# Patient Record
Sex: Female | Born: 1965
Health system: Southern US, Community
[De-identification: ages and names within clinical notes are randomized; demographics above are authoritative.]

## PROBLEM LIST (undated history)

## (undated) ENCOUNTER — Emergency Department (HOSPITAL_COMMUNITY)

## (undated) DIAGNOSIS — R079 Chest pain, unspecified: Secondary | ICD-10-CM

## (undated) DIAGNOSIS — I82609 Acute embolism and thrombosis of unspecified veins of unspecified upper extremity: Secondary | ICD-10-CM

## (undated) DIAGNOSIS — M179 Osteoarthritis of knee, unspecified: Secondary | ICD-10-CM

## (undated) DIAGNOSIS — E785 Hyperlipidemia, unspecified: Secondary | ICD-10-CM

## (undated) DIAGNOSIS — U071 COVID-19: Secondary | ICD-10-CM

## (undated) DIAGNOSIS — J209 Acute bronchitis, unspecified: Secondary | ICD-10-CM

## (undated) DIAGNOSIS — R519 Headache, unspecified: Secondary | ICD-10-CM

## (undated) DIAGNOSIS — E119 Type 2 diabetes mellitus without complications: Secondary | ICD-10-CM

## (undated) DIAGNOSIS — I38 Endocarditis, valve unspecified: Secondary | ICD-10-CM

## (undated) DIAGNOSIS — I471 Supraventricular tachycardia, unspecified: Secondary | ICD-10-CM

## (undated) DIAGNOSIS — G709 Myoneural disorder, unspecified: Secondary | ICD-10-CM

## (undated) DIAGNOSIS — Z9889 Other specified postprocedural states: Secondary | ICD-10-CM

## (undated) DIAGNOSIS — I5032 Chronic diastolic (congestive) heart failure: Secondary | ICD-10-CM

## (undated) DIAGNOSIS — J45909 Unspecified asthma, uncomplicated: Secondary | ICD-10-CM

## (undated) DIAGNOSIS — E7800 Pure hypercholesterolemia, unspecified: Secondary | ICD-10-CM

## (undated) DIAGNOSIS — R000 Tachycardia, unspecified: Secondary | ICD-10-CM

## (undated) DIAGNOSIS — I7300 Raynaud's syndrome without gangrene: Secondary | ICD-10-CM

## (undated) DIAGNOSIS — R400 Somnolence: Secondary | ICD-10-CM

## (undated) DIAGNOSIS — I499 Cardiac arrhythmia, unspecified: Secondary | ICD-10-CM

## (undated) DIAGNOSIS — R002 Palpitations: Secondary | ICD-10-CM

## (undated) DIAGNOSIS — Z973 Presence of spectacles and contact lenses: Secondary | ICD-10-CM

## (undated) DIAGNOSIS — G4730 Sleep apnea, unspecified: Secondary | ICD-10-CM

## (undated) DIAGNOSIS — I517 Cardiomegaly: Secondary | ICD-10-CM

## (undated) DIAGNOSIS — M1990 Unspecified osteoarthritis, unspecified site: Secondary | ICD-10-CM

## (undated) DIAGNOSIS — T8859XA Other complications of anesthesia, initial encounter: Secondary | ICD-10-CM

## (undated) DIAGNOSIS — E1165 Type 2 diabetes mellitus with hyperglycemia: Secondary | ICD-10-CM

## (undated) DIAGNOSIS — R112 Nausea with vomiting, unspecified: Secondary | ICD-10-CM

## (undated) DIAGNOSIS — C801 Malignant (primary) neoplasm, unspecified: Secondary | ICD-10-CM

## (undated) DIAGNOSIS — C649 Malignant neoplasm of unspecified kidney, except renal pelvis: Secondary | ICD-10-CM

## (undated) DIAGNOSIS — F32A Depression, unspecified: Secondary | ICD-10-CM

## (undated) DIAGNOSIS — F4310 Post-traumatic stress disorder, unspecified: Secondary | ICD-10-CM

## (undated) DIAGNOSIS — I10 Essential (primary) hypertension: Secondary | ICD-10-CM

## (undated) DIAGNOSIS — K219 Gastro-esophageal reflux disease without esophagitis: Secondary | ICD-10-CM

## (undated) HISTORY — PX: ENDOMETRIAL ABLATION: SHX621

## (undated) HISTORY — DX: Osteoarthritis of knee, unspecified: M17.9

## (undated) HISTORY — DX: Palpitations: R00.2

## (undated) HISTORY — DX: Type 2 diabetes mellitus with hyperglycemia: E11.65

## (undated) HISTORY — DX: Unspecified asthma, uncomplicated: J45.909

## (undated) HISTORY — DX: Morbid (severe) obesity due to excess calories: E66.01

## (undated) HISTORY — DX: Somnolence: R40.0

## (undated) HISTORY — DX: Raynaud's syndrome without gangrene: I73.00

## (undated) HISTORY — PX: CARDIAC ELECTROPHYSIOLOGY STUDY AND ABLATION: SHX1294

## (undated) HISTORY — DX: Supraventricular tachycardia, unspecified: I47.10

## (undated) HISTORY — DX: Supraventricular tachycardia: I47.1

## (undated) HISTORY — DX: Type 2 diabetes mellitus without complications: E11.9

## (undated) HISTORY — DX: Hyperlipidemia, unspecified: E78.5

## (undated) HISTORY — DX: Chest pain, unspecified: R07.9

## (undated) HISTORY — DX: Chronic diastolic (congestive) heart failure: I50.32

## (undated) HISTORY — DX: Acute bronchitis, unspecified: J20.9

## (undated) HISTORY — DX: COVID-19: U07.1

## (undated) HISTORY — PX: CARDIAC CATHETERIZATION: SHX172

## (undated) SURGERY — Surgical Case
Anesthesia: *Unknown

---

## 1998-10-05 ENCOUNTER — Other Ambulatory Visit: Admission: RE | Admit: 1998-10-05 | Discharge: 1998-10-05 | Payer: Self-pay | Admitting: Obstetrics and Gynecology

## 2000-02-10 ENCOUNTER — Ambulatory Visit (HOSPITAL_COMMUNITY): Admission: RE | Admit: 2000-02-10 | Discharge: 2000-02-10 | Payer: Self-pay | Admitting: Pulmonary Disease

## 2000-03-13 ENCOUNTER — Other Ambulatory Visit: Admission: RE | Admit: 2000-03-13 | Discharge: 2000-03-13 | Payer: Self-pay | Admitting: Obstetrics and Gynecology

## 2000-09-18 ENCOUNTER — Ambulatory Visit (HOSPITAL_COMMUNITY): Admission: RE | Admit: 2000-09-18 | Discharge: 2000-09-18 | Payer: Self-pay | Admitting: Cardiology

## 2001-07-11 ENCOUNTER — Other Ambulatory Visit: Admission: RE | Admit: 2001-07-11 | Discharge: 2001-07-11 | Payer: Self-pay | Admitting: Obstetrics and Gynecology

## 2002-08-02 ENCOUNTER — Emergency Department (HOSPITAL_COMMUNITY): Admission: EM | Admit: 2002-08-02 | Discharge: 2002-08-02 | Payer: Self-pay | Admitting: Emergency Medicine

## 2002-11-19 ENCOUNTER — Other Ambulatory Visit: Admission: RE | Admit: 2002-11-19 | Discharge: 2002-11-19 | Payer: Self-pay | Admitting: Obstetrics and Gynecology

## 2003-02-04 ENCOUNTER — Encounter: Payer: Self-pay | Admitting: Obstetrics and Gynecology

## 2003-02-04 ENCOUNTER — Ambulatory Visit (HOSPITAL_COMMUNITY): Admission: RE | Admit: 2003-02-04 | Discharge: 2003-02-04 | Payer: Self-pay | Admitting: Obstetrics and Gynecology

## 2003-03-18 ENCOUNTER — Encounter: Payer: Self-pay | Admitting: Obstetrics and Gynecology

## 2003-03-18 ENCOUNTER — Ambulatory Visit (HOSPITAL_COMMUNITY): Admission: RE | Admit: 2003-03-18 | Discharge: 2003-03-18 | Payer: Self-pay | Admitting: Obstetrics and Gynecology

## 2003-03-27 ENCOUNTER — Emergency Department (HOSPITAL_COMMUNITY): Admission: EM | Admit: 2003-03-27 | Discharge: 2003-03-27 | Payer: Self-pay | Admitting: Emergency Medicine

## 2003-04-21 ENCOUNTER — Inpatient Hospital Stay (HOSPITAL_COMMUNITY): Admission: AD | Admit: 2003-04-21 | Discharge: 2003-04-21 | Payer: Self-pay | Admitting: Obstetrics and Gynecology

## 2003-06-02 ENCOUNTER — Ambulatory Visit (HOSPITAL_COMMUNITY): Admission: RE | Admit: 2003-06-02 | Discharge: 2003-06-02 | Payer: Self-pay | Admitting: Obstetrics and Gynecology

## 2003-06-19 ENCOUNTER — Inpatient Hospital Stay (HOSPITAL_COMMUNITY): Admission: RE | Admit: 2003-06-19 | Discharge: 2003-06-23 | Payer: Self-pay | Admitting: Obstetrics and Gynecology

## 2003-06-19 ENCOUNTER — Encounter (INDEPENDENT_AMBULATORY_CARE_PROVIDER_SITE_OTHER): Payer: Self-pay | Admitting: Specialist

## 2004-01-28 ENCOUNTER — Other Ambulatory Visit: Admission: RE | Admit: 2004-01-28 | Discharge: 2004-01-28 | Payer: Self-pay | Admitting: Obstetrics and Gynecology

## 2004-07-05 ENCOUNTER — Ambulatory Visit: Payer: Self-pay | Admitting: Internal Medicine

## 2004-10-14 ENCOUNTER — Ambulatory Visit: Payer: Self-pay | Admitting: Internal Medicine

## 2004-10-19 ENCOUNTER — Ambulatory Visit (HOSPITAL_COMMUNITY): Admission: RE | Admit: 2004-10-19 | Discharge: 2004-10-20 | Payer: Self-pay | Admitting: Internal Medicine

## 2004-10-24 ENCOUNTER — Ambulatory Visit: Payer: Self-pay | Admitting: Cardiology

## 2007-06-17 ENCOUNTER — Emergency Department (HOSPITAL_COMMUNITY): Admission: EM | Admit: 2007-06-17 | Discharge: 2007-06-17 | Payer: Self-pay | Admitting: Emergency Medicine

## 2008-12-04 ENCOUNTER — Encounter (INDEPENDENT_AMBULATORY_CARE_PROVIDER_SITE_OTHER): Payer: Self-pay | Admitting: *Deleted

## 2009-05-31 ENCOUNTER — Emergency Department (HOSPITAL_COMMUNITY): Admission: EM | Admit: 2009-05-31 | Discharge: 2009-05-31 | Payer: Self-pay | Admitting: Emergency Medicine

## 2009-07-01 ENCOUNTER — Ambulatory Visit (HOSPITAL_COMMUNITY): Admission: RE | Admit: 2009-07-01 | Discharge: 2009-07-01 | Payer: Self-pay | Admitting: Cardiology

## 2010-01-13 ENCOUNTER — Emergency Department (HOSPITAL_COMMUNITY): Admission: EM | Admit: 2010-01-13 | Discharge: 2010-01-13 | Payer: Self-pay | Admitting: Emergency Medicine

## 2010-04-27 ENCOUNTER — Ambulatory Visit (HOSPITAL_COMMUNITY): Admission: RE | Admit: 2010-04-27 | Payer: Self-pay | Admitting: Pulmonary Disease

## 2010-06-18 ENCOUNTER — Encounter: Payer: Self-pay | Admitting: Pulmonary Disease

## 2010-08-13 ENCOUNTER — Emergency Department (HOSPITAL_COMMUNITY)
Admission: EM | Admit: 2010-08-13 | Discharge: 2010-08-14 | Disposition: A | Discharge status: Home / Self Care | Payer: Self-pay | Attending: Emergency Medicine | Admitting: Emergency Medicine

## 2010-08-13 DIAGNOSIS — K089 Disorder of teeth and supporting structures, unspecified: Secondary | ICD-10-CM | POA: Insufficient documentation

## 2010-08-13 DIAGNOSIS — K029 Dental caries, unspecified: Secondary | ICD-10-CM | POA: Insufficient documentation

## 2010-08-13 DIAGNOSIS — Z79899 Other long term (current) drug therapy: Secondary | ICD-10-CM | POA: Insufficient documentation

## 2010-08-13 DIAGNOSIS — K047 Periapical abscess without sinus: Secondary | ICD-10-CM | POA: Insufficient documentation

## 2010-08-14 LAB — DIFFERENTIAL
Basophils Absolute: 0 10*3/uL (ref 0.0–0.1)
Basophils Relative: 0 % (ref 0–1)
Eosinophils Absolute: 0.2 10*3/uL (ref 0.0–0.7)
Monocytes Absolute: 0.8 10*3/uL (ref 0.1–1.0)
Monocytes Relative: 5 % (ref 3–12)
Neutrophils Relative %: 83 % — ABNORMAL HIGH (ref 43–77)

## 2010-08-14 LAB — BASIC METABOLIC PANEL
CO2: 27 mEq/L (ref 19–32)
Chloride: 102 mEq/L (ref 96–112)
Creatinine, Ser: 0.67 mg/dL (ref 0.4–1.2)
GFR calc Af Amer: >60 mL/min (ref >60)
Glucose, Bld: 151 mg/dL — ABNORMAL HIGH (ref 70–99)

## 2010-08-14 LAB — CBC
MCHC: 34.4 g/dL (ref 30.0–36.0)
MCV: 88.4 fL (ref 78.0–100.0)
RBC: 4.59 MIL/uL (ref 3.87–5.11)
RDW: 13 % (ref 11.5–15.5)

## 2010-08-14 LAB — POCT CARDIAC MARKERS: CKMB, poc: 2.1 ng/mL (ref 1.0–8.0)

## 2010-08-18 LAB — URINALYSIS, ROUTINE W REFLEX MICROSCOPIC
Glucose, UA: NEGATIVE mg/dL
Protein, ur: NEGATIVE mg/dL
Specific Gravity, Urine: 1.027 (ref 1.005–1.030)
pH: 5.5 (ref 5.0–8.0)

## 2010-08-18 LAB — URINE MICROSCOPIC-ADD ON

## 2010-10-14 NOTE — Op Note (Signed)
NAMESHAIANN, MCMANAMON                  ACCOUNT NO.:  [id number]   MEDICAL RECORD NO.:  [id number]          PATIENT TYPE:  OIB   LOCATION:  2899                         FACILITY:  [facility]   PHYSICIAN:  Doylene Canning. Ladona Ridgel, M.D.  DATE OF BIRTH:  11-21-1965   DATE OF PROCEDURE:  10/19/2004  DATE OF DISCHARGE:                                 OPERATIVE REPORT   PROCEDURE PERFORMED:  Electrophysiologic study and RF catheter ablation of  unusual AV node reentry tachycardia.   INTRODUCTION:  Patient is a 45 year old woman with a history of recurrent  tachy palpitations and multiple visits to the emergency room with  termination with adenosine of her arrhythmias. The patient despite medical  therapy has had continued symptoms and is now referred for  electrophysiologic study and catheter ablation.   PROCEDURE:  After informed consent was obtained, the patient is taken to the  diagnostic EP lab in fasting state.  After the usual preparation and  draping, intravenous fentanyl and Midazolam was given for sedation. The 6-  [place] hexapolar catheter was inserted percutaneously into the right jugular  vein and advanced to the coronary sinus. A 5-French quadripolar catheter was  inserted percutaneously into the right femoral vein and advanced to the  right ventricle. 5-French quadripolar catheter was inserted percutaneously  in the right femoral vein advanced to the His bundle region. After  measurement basic intervals, rapid ventricular pacing was carried out the RV  apex at paced cycle length of 600 milliseconds and stepwise decreased down  to 500 milliseconds where VA Wenckebach was observed. During rapid  ventricular pacing, the atrial activation sequence was midline and  decremental. Next programmed ventricular stimulation was carried out the RV  apex at basic drive cycle length of 045 milliseconds.  The S1-S2 interval  was stepwise decreased down to 420 milliseconds where the retrograde AV node  ERP  was observed. During programmed ventricular stimulation the atrial  activation was again midline and decremental. Next, programmed atrial  stimulation was carried out the coronary sinus at basic drive cycle length  of 409 milliseconds.  The S1-S2 interval was stepwise decreased down to 250  milliseconds where the AV node ERP was observed. During programmed  atrial  stimulation there were no clear AH jumps but there were echo beats. The PR  interval was greater than the RR interval. There was no inducible SVT with  programmed atrial stimulation at base drive cycle lengths of 811, 500, and  400 milliseconds both from the coronary sinus as well as the heart atrium.  Next rapid atrial pacing was carried out from the coronary sinus as well as  the high right atrium at pacing cycle length of 600 milliseconds and  stepwise decreased down to 3 milliseconds where AV Wenckebach was observed.  During rapid atrial pacing, there was inducible although it was somewhat  nonsustained SVT. This would started with PR prolongation. The SVT was a  short RP tachycardia but it was typically longer than would normally be  expected with AV node reentrant tachycardia. Despite this, PVCs placed at  the time of  His bundle refractoriness did not pre-excite the atrium. In  addition, V pacing during tachycardia demonstrated clear-cut VAV conduction  sequence. All the above were demonstrative for AV node reentry tachycardia  of the unusual type. The ablation catheter was then moved to the region of  Koch's triangle and mapping in Koch's triangle was carried out. Despite the  patient's somewhat large size, Koch's triangle was extraordinarily small.  The coronary sinus also was displaced anteriorly. All the above made mapping  quite difficult. Nine RF energy applications were delivered at progressive  sites in Koch's triangle starting at site eight and progressing up to site 3  to 4. RF energy application was delivered  throughout the sites resulting in  accelerated junctional rhythm and occasionally at sites 5 through 3 in  Koch's triangle intermittent VA block during junctional rhythm. The above  were suggestive that the catheter was very close to the AV node and that  heart block was very possible. Despite this, complete heart block did not  occur. Finally on the ninth RF energy application, rapid atrial pacing  resulted in no inducible tachycardia. It should be noted that during the  first several RF energy applications tachycardia could easily be reinduced  despite prolonged accelerated junctional rhythm. Finally during the during  the final RF energy application, which was very close within one catheter  width from the AV node there was no were no additional inducible SVT. At  this point the catheters were removed. Hemostasis was assured and the  patient returned to her room in satisfactory condition.   COMPLICATIONS:  There were no immediate procedure complications.   RESULTS:  A.  Baseline ECG. The baseline ECG demonstrates normal sinus  rhythm with normal axis and intervals. There is no pre-excitation noted.  B.  Baseline intervals.  Sinus node cycle length was 828 milliseconds, the  PR interval 152 milliseconds, QRS duration 94 milliseconds, the HV interval  43 milliseconds.  C.  Rapid ventricular pacing. Rapid ventricular pacing was carried out the  RV apex demonstrated VA Wenckebach cycle length of 500 milliseconds. During  rapid ventricular pacemaker activation was midline decremental. During rapid  ventricular pacing there was no inducible SVT.  D.  Programmed ventricular stimulation. Programmed ventricular stimulation  was carried out the RV apex at basic drive cycle length of 191 milliseconds.  The S1-S2 interval was stepwise decreased from 540 milliseconds down to 420  milliseconds with a retrograde AV node ERP was observed. During probing stimulation there was no inducible SVT and the  atrial activation was midline  decremental.  E.  Programmed atrial stimulation.  Programmed atrial stimulation was  carried out the coronary sinus as well as from the high right atrium at  basic drive cycle lengths of 478, 500, and 400 milliseconds.  The S1-S2  interval was stepwise decreased down to the AV node ERP and S1-S2 coupling  interval 500/250. During programmed atrial stimulation there were echo beats  but no clear-cut AH jumps noted. The PR interval was greater than the RR  interval during programmed atrial stimulation.  F.  Rapid atrial pacing. Rapid atrial pacing was carried out from the  coronary sinus as well as the high right atrium at base drive cycle length  of 295 milliseconds and stepwise decreased down to 300 milliseconds where AV  Wenckebach was observed. During rapid atrial pacing, there was inducible SVT  particularly during isoproterenol infusion.  G.  Arrhythmias observed.  1.  AV node reentry tachycardia (unusual).  Initiation  was with rapid atrial pacing during isoproterenol infusion,  duration was sustained, cycle length during tachycardia was approximately  400 milliseconds. The tachycardia would terminate spontaneously and with  rapid ventricular pacing.  H.  Mapping. Mapping was the patient's SVT demonstrated the earliest atrial  activation in Koch's triangle very close to the region of the AV node. It  should be noted that Koch's triangle was extraordinarily small and the  coronary sinus was quite anteriorly displaced. I.  RF energy applications.  A total of nine RF energy applications were very carefully delivered in the  region around the AV node in Koch's triangle. These were initially delivered  between site seven and site eight in Koch's triangle. However, because of  despite the presence of accelerated junctional rhythm. The patient has  persistence of inducible SVT. Additional RF energy applications were  delivered in regions progressively, closer  and closer to the AV node.  Finally at site 3 to 4 in Koch's triangle during the ninth RF energy  application there was accelerated junctional rhythm followed by V without an  A and RF energy was discontinued. There was no complete heart block  delivered, however. Following this there was no inducible SVT.   CONCLUSION:  This study demonstrates successful electrophysiologic study and  RF catheter ablation of unusual AV node reentry tachycardia, with total of  nine RF energy applications. It should be noted that the patient's pathway  was very, very close to the AV node. This resulted in limitation of RF  energy application in this particular patient. There no immediate procedure  complications.      GWT/MEDQ  D:  10/19/2004  T:  10/19/2004  Job:  [id number]   cc:   Ramon Dredge L. Juanetta Gosling, M.D.  [address]  [place]  [place] [address]  Fax: [phone]   Miguel Aschoff, M.D.  [address], [address]  [place]  [place] [address]  Fax: [phone]

## 2010-10-14 NOTE — Op Note (Signed)
Christina Cameron, Christina Cameron                            ACCOUNT NO.:  [id number]   MEDICAL RECORD NO.:  [id number]                   PATIENT TYPE:  INP   LOCATION:  9121                                 [facility]   PHYSICIAN:  Miguel Aschoff, M.D.                    DATE OF BIRTH:  Jun 11, 1965   DATE OF PROCEDURE:  06/19/2003  DATE OF DISCHARGE:                                 OPERATIVE REPORT   PREOPERATIVE DIAGNOSES:  1. Intrauterine pregnancy at 37-1/2 weeks.  2. Pregnancy-induced hypertension.  3. Morbid obesity.  4. Previous cesarean section.  5. Desired sterilization.   POSTOPERATIVE DIAGNOSES:  1. Intrauterine pregnancy at 37-1/2 weeks.  2. Pregnancy-induced hypertension.  3. Morbid obesity.  4. Previous cesarean section.  5. Desired sterilization.  6. Viable female infant, Apgars 9 and 9.   PROCEDURES:  1. Repeat low flap transverse cesarean section.  2. Bilateral Pomeroy tubal sterilization.   SURGEON:  Miguel Aschoff, M.D.   ASSISTANT:  Carrington Clamp, M.D.   ANESTHESIA:  Spinal.   COMPLICATIONS:  None.   JUSTIFICATION:  The patient is a 45 year old white female, gravida 4, para 1-  0-2-1, at 37-1/2 weeks' gestation.  The patient is noted to be hypertensive.  In addition, the patient has had a previous cesarean section and requests  sterilization in view of the hypertension.  At this gestational age she is  being taken now to undergo repeat cesarean section.  The risks and benefits  were discussed with the patient, and informed consent has been obtained.   DESCRIPTION OF PROCEDURE:  The patient was taken to the operating room and  placed in a sitting position, and spinal anesthesia was administered without  difficulty.  After this, was placed in the supine position deviated to the  left and prepped and draped in the usual sterile fashion.  A Foley catheter  was inserted.  The previous Pfannenstiel incision was then re-incised.  Incision was extended down through  subcutaneous tissue with bleeding points  being clamped and coagulated as they were encountered.  The fascia was then  identified and incised transversely and separated from the underlying rectus  muscles.  The rectus muscles were divided in the midline.  The peritoneum  was then found and entered carefully, avoiding the underlying structures.  At this point a bladder flap was created and protected with a bladder blade.  Then an elliptical transverse incision was made into the lower uterine  segment.  The amniotic cavity was entered.  Clear fluid was obtained.  At  this point the patient, with the assistance of a vacuum extractor, was  delivered of a viable female infant, Apgar 9 at one minute and 9 at five  minutes, from a vertex LOA position.  Nose and mouth were suctioned and the  baby was handed to the pediatric team in attendance.  A cord about the  shoulder was noted.  At this point cord bloods were obtained for appropriate  studies.  The placenta was then delivered.  The uterus was then evacuated of  any remaining products of conception.  At this point the angles of the  uterine incision were ligated using figure-of-eight sutures of #1 Vicryl.  Then the uterus was closed in layers, and the first layer was a running  interlocking suture of #1 Vicryl followed by an imbricating suture of #1  Vicryl.  Once this was done the bladder flap was reapproximated using  running continuous 2-0 Vicryl suture.  Attention was then directed to the  right tube, which was grasped with a Babcock clamp, a knuckle of tube was  created, and this knuckle of tube was then ligated with two ligatures of 0  plain gut.  The segment of tube above the ligatures was excised and the  tubal stumps were cauterized, and the identical procedure was carried out on  the opposite side.  At this point with good hemostasis present, lap counts  were taken and found to be correct and then the abdomen was closed.  The  parietal  peritoneum was closed using running continuous 0 Vicryl suture.  Rectus muscles were reapproximated using running continuous 0 Vicryl suture.  The fascia was then closed using two sutures of 0 Vicryl, each starting at  the lateral fascial angles and meeting in the midline.  A subcutaneous  Jackson-Pratt drain was placed because of the patient's large size.  Once  this was done, interrupted 0 Vicryl sutures were used to close the  subcutaneous tissue and then staples were applied to the skin.  A pressure  dressing was applied.  The patient was taken out of the lateral position and  brought to the recovery room in satisfactory condition.  The estimated blood  loss was approximately 800 mL.  The patient tolerated the procedure well and  went to the recovery room in satisfactory condition. The baby was taken to  the nursery in satisfactory condition.                                               Miguel Aschoff, M.D.    AR/MEDQ  D:  06/19/2003  T:  06/20/2003  Job:  [id number]

## 2010-10-14 NOTE — Discharge Summary (Signed)
Christina Cameron, Christina Cameron                            ACCOUNT NO.:  [id number]   MEDICAL RECORD NO.:  [id number]                   PATIENT TYPE:  INP   LOCATION:  9121                                 [facility]   PHYSICIAN:  Miguel Aschoff, M.D.                    DATE OF BIRTH:  June 28, 1965   DATE OF ADMISSION:  06/19/2003  DATE OF DISCHARGE:  06/23/2003                                 DISCHARGE SUMMARY   FINAL DIAGNOSES:  1. Intrauterine pregnancy at 37-and-a-half weeks gestation.  2. Pregnancy-induced hypertension.  3. Morbid obesity.  4. History of previous cesarean section, desires repeat cesarean section.  5. Desires permanent sterilization.  6. Postoperative lower extremity edema.   PROCEDURES:  Repeat low flap transverse cesarean section and bilateral  Pomeroy tubal sterilization.   SURGEON:  Dr.  Miguel Aschoff.   ASSESSMENT:  Dr.  Carrington Clamp.   COMPLICATIONS:  None.   This 45 year old G4 P1-0-2-1 presents at 37-and-a-half weeks gestation for a  repeat cesarean section.  The patient is hypertensive at this point.  She is  not showing any signs of preeclampsia.  PIH labs were all normal upon  admission.  The patient's antepartum course had been complicated by advanced  maternal age.  She did decline amniocentesis.  The patient was also obese  and her weight was watched during the pregnancy.  The patient at this point  was taken to the operating room on June 19, 2003 by Dr. Miguel Aschoff where  a repeat low flap transverse cesarean section was performed with the  delivery of an 8-pound 2-ounce female infant with Apgars of 8 and 9.  Delivery went without complications at this point and bilateral Pomeroy  tubal sterilization procedure was performed.  That went without  complications.  The patient's postoperative course was complicated by  pitting edema in her lower extremities.  Dopplers were performed to rule out  a DVT.  The patient was given Lasix for her edema.  By  postoperative day #1  the patient was voiding and feeling better.  She did have a JP drain in to  ensure that there was not any development of a seroma in the incision.  The  JP was removed on postoperative day #3.  Her Lasix was continued and her  swelling was still present.  The patient was felt ready for discharge by  postoperative day #4.  She was sent home on  regular diet, told to decrease  activities, told to continue prenatal vitamins, was given a prescription for  hydrochlorothiazide 15 mg one daily for her swelling, was given Tylox one  q.3h. as needed for pain, was told to follow up in the office in 4 weeks,  call with any increase in swelling or her pain.   LABORATORY DATA ON DISCHARGE:  The patient had a hemoglobin of 10.1, white  blood cell count of  11.2, and like I said before, PIH labs were all normal.     Christina Cameron, P.A.-C.                Miguel Aschoff, M.D.   MB/MEDQ  D:  08/14/2003  T:  08/15/2003  Job:  [id number]

## 2010-10-14 NOTE — Discharge Summary (Signed)
Christina Cameron, Christina Cameron                  ACCOUNT NO.:  [id number]   MEDICAL RECORD NO.:  [id number]          PATIENT TYPE:  OIB   LOCATION:  6525                         FACILITY:  [facility]   PHYSICIAN:  Doylene Canning. Ladona Ridgel, M.D.  DATE OF BIRTH:  12-22-1965   DATE OF ADMISSION:  10/19/2004  DATE OF DISCHARGE:  10/20/2004                                 DISCHARGE SUMMARY   DISCHARGE DIAGNOSIS:  Status post procedure successful EP  study/radiofrequency catheter ablation of unusual AV node reentry  tachycardia.   PAST MEDICAL HISTORY:  1.  Tachycardia.  2.  Morbid obesity.  3.  Questionable diabetes being followed by primary care.  4.  Status post stress echocardiogram in 2002 with EF of 70% negative for      ischemia.   CARDIOLOGIST:  Dr. Lewayne Bunting.   PRIMARY CARE PHYSICIAN:  Dr. Kari Baars in [place], [place].   DISPOSITION:  Home with Toprol XL 25 mg daily.   PAIN MANAGEMENT:  Tylenol for general discomfort.   ACTIVITY:  No driving x2 days.  No lifting over 10 pounds x1 week.   DIET:  Low fat, low salt, or as instructed by Dr. Juanetta Gosling.   WOUND CARE:  Gently clean cath site with soap and water, no tub bathing x2  days.  She is to call our office for any swelling, pain, or fever.  She has  a followup appointment with Dr. Ladona Ridgel, December 14, 2004 at 3:45 p.m.  The  patient discharged home with prescription for Toprol XL.      MB/MEDQ  D:  10/20/2004  T:  10/20/2004  Job:  [id number]   cc:   Ramon Dredge L. Juanetta Gosling, M.D.  [address]  [place]  [place] [address]  Fax: [phone]

## 2010-11-11 ENCOUNTER — Other Ambulatory Visit (HOSPITAL_COMMUNITY): Payer: Self-pay

## 2010-11-16 ENCOUNTER — Encounter (HOSPITAL_COMMUNITY)
Admission: RE | Admit: 2010-11-16 | Discharge: 2010-11-16 | Disposition: A | Discharge status: Home / Self Care | Payer: Medicaid Other | Source: Ambulatory Visit | Attending: Obstetrics and Gynecology | Admitting: Obstetrics and Gynecology

## 2010-11-16 LAB — CBC
Hemoglobin: 13.3 g/dL (ref 12.0–15.0)
MCH: 30.4 pg (ref 26.0–34.0)
MCHC: 33.6 g/dL (ref 30.0–36.0)

## 2010-11-18 ENCOUNTER — Ambulatory Visit (HOSPITAL_COMMUNITY)
Admission: RE | Admit: 2010-11-18 | Discharge: 2010-11-18 | Disposition: A | Discharge status: Home / Self Care | Payer: Medicaid Other | Source: Ambulatory Visit | Attending: Obstetrics and Gynecology | Admitting: Obstetrics and Gynecology

## 2010-11-18 ENCOUNTER — Other Ambulatory Visit: Payer: Self-pay | Admitting: Obstetrics and Gynecology

## 2010-11-18 DIAGNOSIS — Z01818 Encounter for other preprocedural examination: Secondary | ICD-10-CM | POA: Insufficient documentation

## 2010-11-18 DIAGNOSIS — Z01812 Encounter for preprocedural laboratory examination: Secondary | ICD-10-CM | POA: Insufficient documentation

## 2010-11-18 DIAGNOSIS — N920 Excessive and frequent menstruation with regular cycle: Secondary | ICD-10-CM | POA: Insufficient documentation

## 2010-12-01 NOTE — Op Note (Signed)
Christina Cameron, Christina Cameron NO.:  [id number]  MEDICAL RECORD NO.:  [id number]  LOCATION:  [facility]                          [facility]  PHYSICIAN:  Miguel Aschoff, M.D.       DATE OF BIRTH:  [birth year]  DATE OF PROCEDURE:  11/18/2010 DATE OF DISCHARGE:                              OPERATIVE REPORT   PREOPERATIVE DIAGNOSIS:  Menorrhagia.  POSTOPERATIVE DIAGNOSIS:  Menorrhagia.  PROCEDURE:  Cervical dilatation, hysteroscopy, uterine curettage followed by NovaSure endometrial ablation.  SURGEON:  Miguel Aschoff, MD  ANESTHESIA:  General.  COMPLICATIONS:  None.  JUSTIFICATION:  The patient is a 45 year old white female with history of regular, but exceedingly heavy menses with passage of large blood clots.  Because of the heavy bleeding and interferes with the patient's normal routines of life.  She has requested that a procedure be carried out in an effort to control the heavy bleeding.  The risks and benefits of the procedure were discussed with the patient.  Informed consent has been obtained.  PROCEDURE:  The patient was taken to the operating room, placed in the supine position.  General anesthesia was administered without difficulty.  She was then placed in the dorsal lithotomy position, prepped and draped in usual sterile fashion.  Bladder was catheterized. Examination under anesthesia at this point revealed normal external genitalia and normal Bartholin, Skene glands, normal urethra.  The vaginal vault was without gross lesion.  The uterus was smooth and regular in anterior and not significantly enlarged.  Once this was done, a speculum was placed in the vaginal vault.  The anterior cervical lip was grasped with a tenaculum and then the endocervical canal and uterus was sounded to 9.5 cm.  Cervical length of 4 cm was then determined for a cavity length of 5.5 cm.  Once this was done, the cervix was further dilated and the diagnostic hysteroscope was then  advanced through the endocervical canal.  No endocervical lesions were noted on entering the endometrial cavity.  The cavity appeared to be smooth and regular.  No polyps or submucous myomas were noted.  Once inspection of the cavity was completed, the hysteroscope was removed and sharp vigorous curettage was carried out with medium-size serrated curette and tissue was sent for histologic study.  After this was completed, the NovaSure endometrial ablation unit was placed through the cervix.  A cavity width of 4.7 cm was determined.  Cavity assessment test was then carried out and passed and once this was done a treatment cycle for 1 minute 40 seconds at 142 watts was carried out without difficulty.  On completion of the treatment cycle, the unit was removed intact.  The hysteroscope was re-advanced into the cervix and inspection revealed the cavity be well coagulated.  This was documented photographically.  At this point, the hysteroscope was removed.  The cervix was injected with total of 10 mL 1% Xylocaine for postop analgesia.  Hemostasis was readily achieved and at this point the patient was reversed from the anesthetic, taken out of lithotomy position, and brought to recovery room in satisfactory condition.  Estimated blood loss was about 30-40 mL.  Plan is for the  patient to be discharged home.  Medications for home include doxycycline 1 twice a day x3 days, Ultram 50 mg 1 every 6-8 hours as needed for pain.  The patient is to call for any problems such as fever, pain or heavy bleeding.  She will be seen back in 4 weeks for follow up examination.     Miguel Aschoff, M.D.     AR/MEDQ  D:  11/18/2010  T:  11/19/2010  Job:  [id number]  Electronically Signed by Miguel Aschoff M.D. on 12/01/2010 04:54:09 PM

## 2011-02-16 LAB — BASIC METABOLIC PANEL
CO2: 29
Calcium: 8.7
Creatinine, Ser: 0.88
GFR calc Af Amer: >60
Glucose, Bld: 126 — ABNORMAL HIGH

## 2011-02-16 LAB — DIFFERENTIAL
Basophils Absolute: 0.1
Basophils Relative: 1
Neutro Abs: 5
Neutrophils Relative %: 54

## 2011-02-16 LAB — CBC
MCHC: 33.5
Platelets: 295
RBC: 4.65
RDW: 13.8

## 2011-02-16 LAB — POCT CARDIAC MARKERS
CKMB, poc: 1.3
Myoglobin, poc: 52.5
Operator id: 211291

## 2011-10-19 ENCOUNTER — Ambulatory Visit: Payer: Self-pay | Admitting: Bariatrics

## 2011-10-19 DIAGNOSIS — E789 Disorder of lipoprotein metabolism, unspecified: Secondary | ICD-10-CM

## 2011-10-19 LAB — CBC WITH DIFFERENTIAL/PLATELET
Basophil #: 0.1 10*3/uL (ref 0.0–0.1)
Basophil %: 1.2 %
Eosinophil #: 0.1 10*3/uL (ref 0.0–0.7)
HCT: 41 % (ref 35.0–47.0)
Lymphocyte #: 2.2 10*3/uL (ref 1.0–3.6)
MCH: 29.7 pg (ref 26.0–34.0)
Monocyte #: 0.7 x10 3/mm (ref 0.2–0.9)
Neutrophil #: 4.3 10*3/uL (ref 1.4–6.5)
Platelet: 236 10*3/uL (ref 150–440)
RDW: 13.9 % (ref 11.5–14.5)
WBC: 7.3 10*3/uL (ref 3.6–11.0)

## 2011-10-19 LAB — PROTIME-INR: Prothrombin Time: 13.5 secs (ref 11.5–14.7)

## 2011-10-19 LAB — COMPREHENSIVE METABOLIC PANEL
Calcium, Total: 8.5 mg/dL (ref 8.5–10.1)
Co2: 24 mmol/L (ref 21–32)
Creatinine: 0.63 mg/dL (ref 0.60–1.30)
Osmolality: 281 (ref 275–301)

## 2011-10-19 LAB — PHOSPHORUS: Phosphorus: 3.4 mg/dL (ref 2.5–4.9)

## 2011-10-19 LAB — TSH: Thyroid Stimulating Horm: 1.62 u[IU]/mL

## 2011-10-19 LAB — AMYLASE: Amylase: 17 U/L — ABNORMAL LOW (ref 25–115)

## 2011-10-27 LAB — PROTIME-INR: Prothrombin Time: 12.7 secs (ref 11.5–14.7)

## 2011-11-01 ENCOUNTER — Emergency Department (HOSPITAL_COMMUNITY)
Admission: EM | Admit: 2011-11-01 | Discharge: 2011-11-01 | Disposition: A | Discharge status: Home / Self Care | Payer: 59 | Attending: Emergency Medicine | Admitting: Emergency Medicine

## 2011-11-01 ENCOUNTER — Encounter (HOSPITAL_COMMUNITY): Payer: Self-pay | Admitting: Emergency Medicine

## 2011-11-01 ENCOUNTER — Emergency Department (HOSPITAL_COMMUNITY): Payer: 59

## 2011-11-01 DIAGNOSIS — I10 Essential (primary) hypertension: Secondary | ICD-10-CM | POA: Insufficient documentation

## 2011-11-01 DIAGNOSIS — Y92009 Unspecified place in unspecified non-institutional (private) residence as the place of occurrence of the external cause: Secondary | ICD-10-CM | POA: Insufficient documentation

## 2011-11-01 DIAGNOSIS — M7989 Other specified soft tissue disorders: Secondary | ICD-10-CM | POA: Insufficient documentation

## 2011-11-01 DIAGNOSIS — S63611A Unspecified sprain of left index finger, initial encounter: Secondary | ICD-10-CM

## 2011-11-01 DIAGNOSIS — M25569 Pain in unspecified knee: Secondary | ICD-10-CM | POA: Insufficient documentation

## 2011-11-01 DIAGNOSIS — W010XXA Fall on same level from slipping, tripping and stumbling without subsequent striking against object, initial encounter: Secondary | ICD-10-CM | POA: Insufficient documentation

## 2011-11-01 DIAGNOSIS — IMO0002 Reserved for concepts with insufficient information to code with codable children: Secondary | ICD-10-CM | POA: Insufficient documentation

## 2011-11-01 DIAGNOSIS — M79609 Pain in unspecified limb: Secondary | ICD-10-CM | POA: Insufficient documentation

## 2011-11-01 DIAGNOSIS — M79606 Pain in leg, unspecified: Secondary | ICD-10-CM

## 2011-11-01 HISTORY — DX: Essential (primary) hypertension: I10

## 2011-11-01 HISTORY — DX: Malignant (primary) neoplasm, unspecified: C80.1

## 2011-11-01 MED ORDER — OXYCODONE-ACETAMINOPHEN 5-325 MG PO TABS: 1.0000 | ORAL_TABLET | ORAL | Status: AC | PRN

## 2011-11-01 NOTE — ED Notes (Signed)
Patient c/o fall x1 week ago. Per patient hit left side hurting left index finger. Per patient 3 days ago left leg started hurting from knee to foot. Patient states "It didn't hurt when I fell but 3 days ago it started hurting really bad. The next day it started feeling numb and I had the tingling feeling in my toes." Patient also reports foot being cold.

## 2011-11-01 NOTE — ED Provider Notes (Signed)
History    This chart was scribed for Christina Human, MD, MD by Smitty Pluck. The patient was seen in room APA10 and the patient's care was started at 3:30PM.   CSN: [id number]  Arrival date & time 11/01/11  1454   None     Chief Complaint  Patient presents with  . Fall  . Hand Pain  . Knee Pain  . Leg Pain    (Consider location/radiation/quality/duration/timing/severity/associated sxs/prior treatment) Patient is a 46 y.o. female presenting with fall, hand pain, knee pain, and leg pain. The history is provided by the patient.  Fall  Hand Pain  Knee Pain  Leg Pain    Christina Cameron is a 46 y.o. female who presents to the Emergency Department complaining of moderate left leg pain, left hand pain and moderate numbness in her toes of left foot onset 3 days ago. Pt reports fall 1 week ago while going to mailbox and slipping due to rain. Pt has had heart ablation, heart catheterization. Symptoms have been constant. Pt is an occasional alcohol drinker and denies smoking. She reports that her foot has been cold. She has had blood clot in her arm in past. Pt states she has problems with fluid retention.   Past Medical History  Diagnosis Date  . Hypertension   . Cancer     Past Surgical History  Procedure Date  . Cardiac catheterization   . Cardiac electrophysiology study and ablation   . Endometrial ablation     Family History  Problem Relation Age of Onset  . Cancer Mother   . Cancer Father   . Stroke Brother   . Hypertension Brother     History  Substance Use Topics  . Smoking status: Never Smoker   . Smokeless tobacco: Never Used  . Alcohol Use: No    OB History    Grav Para Term Preterm Abortions TAB SAB Ect Mult Living   3 2 2  1  1   2       Review of Systems  All other systems reviewed and are negative.  10 Systems reviewed and all are negative for acute change except as noted in the HPI.    Allergies  Review of patient's allergies indicates no  known allergies.  Home Medications  No current outpatient prescriptions on file.  BP 141/82  Pulse 72  Temp(Src) 97.7 F (36.5 C) (Oral)  Resp 20  Ht 5\' 5"  (1.651 m)  Wt 290 lb (131.543 kg)  BMI 48.26 kg/m2  SpO2 100%  Physical Exam  Nursing note and vitals reviewed. Constitutional: She is oriented to person, place, and time. She appears well-developed and well-nourished. No distress.  HENT:  Head: Normocephalic and atraumatic.  Neck: Normal range of motion. Neck supple. No thyromegaly present.  Cardiovascular: Normal rate, regular rhythm and normal heart sounds.   Pulmonary/Chest: Effort normal and breath sounds normal. No respiratory distress.  Abdominal: Soft. She exhibits no distension.  Musculoskeletal: She exhibits edema and tenderness (left calf).       Left index finger tenderness without bony deformity; she has mild swelling over the proximal phalanx of the left index finger. Left knee tenderness around popliteal fossa.  There is no bony deformity of the knee, no ligamentous instability, no effusion.  She has mild swelling of the left calf, but no Homans' sign.  She has intact pulses, sensation and tendon function in the feet.     Neurological: She is alert and oriented to person, place,  and time.  Skin: Skin is warm and dry.  Psychiatric: She has a normal mood and affect. Her behavior is normal.    ED Course  Procedures (including critical care time) DIAGNOSTIC STUDIES: Oxygen Saturation is 100% on room air, normal by my interpretation.    COORDINATION OF CARE: 3:38PM EDP discusses pt ED with pt. Looking for possible DVT  5:07 PM X-ray of the left index finger is negative.  Venous ultrasound of the left leg is negative for DVT.   Pt is dissatisfied, saying she does not have an explanation for why her knee hurts and why she has tingling in the toes of her left foot.  She was offered an x-ray of the left knee but did not want to wait for that.  She has taken  hydrocodone-acetaminophen for pain without relief.  I offered her Percocet for pain.  If her symptoms persist she should followup with Dr. Juanetta Gosling, her internist.   1. Leg pain   2. Sprain of left index finger      I personally performed the services described in this documentation, which was scribed in my presence. The recorded information has been reviewed and considered.  Christina Cameron, M.D.      Carleene Cooper III, MD 11/01/11 [phone]

## 2011-11-01 NOTE — Discharge Instructions (Signed)
Christina Cameron, you had physical examination, x-rays of the left index finger, and venous ultrasound of your left calf to check on you where you had swelling and pain following a fall about a week ago.  Fortunately, your tests were good.  There was no fracture of your finger, and no blood clots in veins of your legs.  You can take the pain medicine Percocet every 4 hours if needed for pain.  If your pain and numb feeling in the left leg persists, see Dr. Juanetta Gosling for followup.

## 2012-10-06 ENCOUNTER — Encounter (HOSPITAL_COMMUNITY): Payer: Self-pay | Admitting: Emergency Medicine

## 2012-10-06 ENCOUNTER — Emergency Department (HOSPITAL_COMMUNITY)
Admission: EM | Admit: 2012-10-06 | Discharge: 2012-10-06 | Disposition: A | Discharge status: Home / Self Care | Payer: Self-pay | Attending: Emergency Medicine | Admitting: Emergency Medicine

## 2012-10-06 ENCOUNTER — Emergency Department (HOSPITAL_COMMUNITY): Payer: Self-pay

## 2012-10-06 DIAGNOSIS — R209 Unspecified disturbances of skin sensation: Secondary | ICD-10-CM | POA: Insufficient documentation

## 2012-10-06 DIAGNOSIS — Z79899 Other long term (current) drug therapy: Secondary | ICD-10-CM | POA: Insufficient documentation

## 2012-10-06 DIAGNOSIS — Z9889 Other specified postprocedural states: Secondary | ICD-10-CM | POA: Insufficient documentation

## 2012-10-06 DIAGNOSIS — R0789 Other chest pain: Secondary | ICD-10-CM | POA: Insufficient documentation

## 2012-10-06 DIAGNOSIS — M25519 Pain in unspecified shoulder: Secondary | ICD-10-CM | POA: Insufficient documentation

## 2012-10-06 DIAGNOSIS — M7989 Other specified soft tissue disorders: Secondary | ICD-10-CM | POA: Insufficient documentation

## 2012-10-06 DIAGNOSIS — M542 Cervicalgia: Secondary | ICD-10-CM | POA: Insufficient documentation

## 2012-10-06 DIAGNOSIS — Z8541 Personal history of malignant neoplasm of cervix uteri: Secondary | ICD-10-CM | POA: Insufficient documentation

## 2012-10-06 DIAGNOSIS — R61 Generalized hyperhidrosis: Secondary | ICD-10-CM | POA: Insufficient documentation

## 2012-10-06 DIAGNOSIS — Z8679 Personal history of other diseases of the circulatory system: Secondary | ICD-10-CM | POA: Insufficient documentation

## 2012-10-06 DIAGNOSIS — M25512 Pain in left shoulder: Secondary | ICD-10-CM

## 2012-10-06 DIAGNOSIS — I10 Essential (primary) hypertension: Secondary | ICD-10-CM | POA: Insufficient documentation

## 2012-10-06 LAB — CBC WITH DIFFERENTIAL/PLATELET
Basophils Absolute: 0 10*3/uL (ref 0.0–0.1)
Eosinophils Absolute: 0.2 10*3/uL (ref 0.0–0.7)
Eosinophils Relative: 3 % (ref 0–5)
Lymphs Abs: 2.5 10*3/uL (ref 0.7–4.0)
MCH: 30.6 pg (ref 26.0–34.0)
MCV: 87.2 fL (ref 78.0–100.0)
Monocytes Absolute: 0.7 10*3/uL (ref 0.1–1.0)
Platelets: 226 10*3/uL (ref 150–400)
RDW: 13.7 % (ref 11.5–15.5)

## 2012-10-06 LAB — BASIC METABOLIC PANEL
Calcium: 8.8 mg/dL (ref 8.4–10.5)
Creatinine, Ser: 0.52 mg/dL (ref 0.50–1.10)
GFR calc non Af Amer: >90 mL/min (ref >90)
Glucose, Bld: 230 mg/dL — ABNORMAL HIGH (ref 70–99)
Sodium: 136 mEq/L (ref 135–145)

## 2012-10-06 MED ORDER — HYDROCODONE-ACETAMINOPHEN 5-325 MG PO TABS: 2.0000 | ORAL_TABLET | ORAL | Status: DC | PRN

## 2012-10-06 MED ORDER — PREDNISONE 10 MG PO TABS: 20.0000 mg | ORAL_TABLET | Freq: Two times a day (BID) | ORAL | Status: DC

## 2012-10-06 MED ORDER — KETOROLAC TROMETHAMINE 30 MG/ML IJ SOLN
30.0000 mg | Freq: Once | INTRAMUSCULAR | Status: AC
  Administered 2012-10-06: 30 mg via INTRAVENOUS
  Filled 2012-10-06: qty 1

## 2012-10-06 NOTE — ED Notes (Signed)
Pt presents with left side swelling and intermittent left sided chest pressure. States that over the last several weeks she has had pain in her left arm and that yesterday she began to have some intermittent pressure in her left chest.

## 2012-10-06 NOTE — ED Provider Notes (Signed)
History  This chart was scribed for Geoffery Lyons, MD by Shari Heritage, ED Scribe. The patient was seen in room APA10/APA10. Patient's care was started at 0703.  CSN: [id number]  Arrival date & time 10/06/12  7829   First MD Initiated Contact with Patient 10/06/12 0703      Chief Complaint  Patient presents with  . Chest Pain  . Arm Pain    The history is provided by the patient. No language interpreter was used.    HPI Comments: Christina Cameron is a 47 y.o. female with history of HTN who presents to the Emergency Department complaining of an episode of sudden left upper chest pain onset yesterday. Patient describes pain as tightness and pressure. She reports associated left neck pain, diaphoresis and tingling of her left upper extremity with the episode. She states that she has also been having persistent left upper arm pain that radiates to her posterior shoulder for the past several weeks with associated swelling. She states that pain is worse with raising of the arm. She has a surgical history of cardiac catheterization (2011) and cardiac ablation. She states that cardiac catheterization was negative for blockages or other active disease. Patient also has a medical history of heart palpitations. She denies known history of diabetes or any other chronic medical conditions. Patient does not smoke.  Past Medical History  Diagnosis Date  . Hypertension   . Cancer     cervical 1989    Past Surgical History  Procedure Laterality Date  . Cardiac catheterization    . Cardiac electrophysiology study and ablation    . Endometrial ablation      Family History  Problem Relation Age of Onset  . Cancer Mother   . Cancer Father   . Stroke Brother   . Hypertension Brother     History  Substance Use Topics  . Smoking status: Never Smoker   . Smokeless tobacco: Never Used  . Alcohol Use: No    OB History   Grav Para Term Preterm Abortions TAB SAB Ect Mult Living   3 2 2  1  1   2        Review of Systems A complete 10 system review of systems was obtained and all systems are negative except as noted in the HPI and PMH.   Allergies  Review of patient's allergies indicates no known allergies.  Home Medications   Current Outpatient Rx  Name  Route  Sig  Dispense  Refill  . albuterol (VENTOLIN HFA) 108 (90 BASE) MCG/ACT inhaler   Inhalation   Inhale 2 puffs into the lungs every 6 (six) hours as needed.         Marland Kitchen aspirin-acetaminophen-caffeine (EXCEDRIN MIGRAINE) 250-250-65 MG per tablet   Oral   Take 2 tablets by mouth as needed. For migraine         . furosemide (LASIX) 40 MG tablet   Oral   Take 40 mg by mouth daily.         Marland Kitchen lisinopril (PRINIVIL,ZESTRIL) 20 MG tablet   Oral   Take 20 mg by mouth daily.         . Multiple Vitamins-Minerals (ONE-A-DAY WEIGHT SMART ADVANCE PO)   Oral   Take 1 tablet by mouth daily.         . SUMAtriptan (IMITREX) 100 MG tablet   Oral   Take 100 mg by mouth every 2 (two) hours as needed. Not to exceed more than 2 tablets within  24 hours           Triage Vitals: BP 150/89  Pulse 96  Temp(Src) 98.1 F (36.7 C) (Oral)  Resp 22  Ht 5' (1.524 m)  Wt 301 lb (136.533 kg)  BMI 58.79 kg/m2  SpO2 97%  Physical Exam  Constitutional: She is oriented to person, place, and time. She appears well-developed and well-nourished.  HENT:  Head: Normocephalic and atraumatic.  Mouth/Throat: Oropharynx is clear and moist.  Eyes: Conjunctivae and EOM are normal. Pupils are equal, round, and reactive to light.  Neck: Normal range of motion. Neck supple.  Cardiovascular: Normal rate, regular rhythm and normal heart sounds.   No murmur heard. Pulmonary/Chest: Effort normal and breath sounds normal. No respiratory distress. She has no wheezes. She has no rales.  Abdominal: Soft. She exhibits no distension and no mass. There is no tenderness. There is no rebound and no guarding.  Musculoskeletal: Normal range of motion. She  exhibits no edema.       Left shoulder: She exhibits pain.  Left shoulder has pain with any ROM. Distally, circulation and motor are intact without deficits.  Neurological: She is alert and oriented to person, place, and time.  Skin: Skin is warm and dry. No rash noted.    ED Course  Procedures (including critical care time) DIAGNOSTIC STUDIES: Oxygen Saturation is 97% on room air, adequate by my interpretation.    COORDINATION OF CARE: 7:15 AM- Patient informed of current plan for treatment and evaluation and agrees with plan at this time.    Labs Reviewed  BASIC METABOLIC PANEL - Abnormal; Notable for the following:    Glucose, Bld 230 (*)    All other components within normal limits  TROPONIN I  CBC WITH DIFFERENTIAL    Dg Chest Portable 1 View  10/06/2012  *RADIOLOGY REPORT*  Clinical Data: 47 year old female with chest and left arm pain.  PORTABLE CHEST - 1 VIEW  Comparison: 05/31/2009 chest radiograph  Findings: The cardiomediastinal silhouette is unremarkable. Mild peribronchial thickening is noted. There is no evidence of focal airspace disease, pulmonary edema, suspicious pulmonary nodule/mass, pleural effusion, or pneumothorax. No acute bony abnormalities are identified.  IMPRESSION: No evidence of acute cardiopulmonary disease.   Original Report Authenticated By: Harmon Pier, M.D.    Dg Shoulder Left  10/06/2012  *RADIOLOGY REPORT*  Clinical Data: Left-sided shoulder pain.  LEFT SHOULDER - 2+ VIEW  Comparison: None.  Findings: Significant degenerative changes are seen involving the left glenohumeral joint with calcifications identified near the expected insertion of the rotator cuff laterally.  Proliferative changes are also seen involving the distal clavicle and acromion. No acute fracture or dislocation is identified.  No bony lesions are seen.  Soft tissues are unremarkable.  IMPRESSION: Significant degenerative changes of the left shoulder.  No acute findings.   Original  Report Authenticated By: Irish Lack, M.D.      No diagnosis found.   Date: 10/06/2012  Rate: 88  Rhythm: normal sinus rhythm  QRS Axis: left  Intervals: normal  ST/T Wave abnormalities: normal  Conduction Disutrbances:none  Narrative Interpretation:   Old EKG Reviewed: unchanged    MDM  The patient presents with left shoulder pain that radiates into her chest.  This seems very musculoskeletal in nature, however a cardiac workup was performed due to her risk factors.  The ekg was unchanged from prior studies and troponin was negative.  Upon review of her record, I found normal coronary arteries on heart cath in 2011.  She will be treated with prednisone as an anti-inflammatory, pain meds.  Return prn.        I personally performed the services described in this documentation, which was scribed in my presence. The recorded information has been reviewed and is accurate.      Geoffery Lyons, MD 10/06/12 [phone]

## 2014-03-30 ENCOUNTER — Encounter (HOSPITAL_COMMUNITY): Payer: Self-pay | Admitting: Emergency Medicine

## 2014-04-28 ENCOUNTER — Encounter (HOSPITAL_COMMUNITY): Payer: Self-pay | Admitting: Family Medicine

## 2014-04-28 ENCOUNTER — Emergency Department (HOSPITAL_COMMUNITY)
Admission: EM | Admit: 2014-04-28 | Discharge: 2014-04-28 | Disposition: A | Discharge status: Home / Self Care | Payer: BLUE CROSS/BLUE SHIELD | Attending: Emergency Medicine | Admitting: Emergency Medicine

## 2014-04-28 DIAGNOSIS — Z79899 Other long term (current) drug therapy: Secondary | ICD-10-CM | POA: Insufficient documentation

## 2014-04-28 DIAGNOSIS — R1084 Generalized abdominal pain: Secondary | ICD-10-CM | POA: Diagnosis present

## 2014-04-28 DIAGNOSIS — Z8541 Personal history of malignant neoplasm of cervix uteri: Secondary | ICD-10-CM | POA: Diagnosis not present

## 2014-04-28 DIAGNOSIS — R739 Hyperglycemia, unspecified: Secondary | ICD-10-CM | POA: Insufficient documentation

## 2014-04-28 DIAGNOSIS — Z7952 Long term (current) use of systemic steroids: Secondary | ICD-10-CM | POA: Diagnosis not present

## 2014-04-28 DIAGNOSIS — I10 Essential (primary) hypertension: Secondary | ICD-10-CM | POA: Insufficient documentation

## 2014-04-28 DIAGNOSIS — R197 Diarrhea, unspecified: Secondary | ICD-10-CM

## 2014-04-28 DIAGNOSIS — K529 Noninfective gastroenteritis and colitis, unspecified: Secondary | ICD-10-CM | POA: Diagnosis not present

## 2014-04-28 DIAGNOSIS — Z9889 Other specified postprocedural states: Secondary | ICD-10-CM | POA: Insufficient documentation

## 2014-04-28 DIAGNOSIS — R112 Nausea with vomiting, unspecified: Secondary | ICD-10-CM

## 2014-04-28 LAB — CBC WITH DIFFERENTIAL/PLATELET
BASOS ABS: 0 10*3/uL (ref 0.0–0.1)
BASOS PCT: 0 % (ref 0–1)
EOS ABS: 0.2 10*3/uL (ref 0.0–0.7)
EOS PCT: 1 % (ref 0–5)
HEMATOCRIT: 42.5 % (ref 36.0–46.0)
Hemoglobin: 14.7 g/dL (ref 12.0–15.0)
LYMPHS PCT: 34 % (ref 12–46)
Lymphs Abs: 4 10*3/uL (ref 0.7–4.0)
MCH: 30.1 pg (ref 26.0–34.0)
MCHC: 34.6 g/dL (ref 30.0–36.0)
MCV: 86.9 fL (ref 78.0–100.0)
MONO ABS: 1.1 10*3/uL — AB (ref 0.1–1.0)
Monocytes Relative: 9 % (ref 3–12)
Neutro Abs: 6.7 10*3/uL (ref 1.7–7.7)
Neutrophils Relative %: 56 % (ref 43–77)
PLATELETS: 258 10*3/uL (ref 150–400)
RBC: 4.89 MIL/uL (ref 3.87–5.11)
RDW: 13.3 % (ref 11.5–15.5)
WBC: 12 10*3/uL — AB (ref 4.0–10.5)

## 2014-04-28 LAB — COMPREHENSIVE METABOLIC PANEL
ALT: 23 U/L (ref 0–35)
AST: 23 U/L (ref 0–37)
Albumin: 4 g/dL (ref 3.5–5.2)
Alkaline Phosphatase: 58 U/L (ref 39–117)
Anion gap: 17 — ABNORMAL HIGH (ref 5–15)
BILIRUBIN TOTAL: 0.4 mg/dL (ref 0.3–1.2)
BUN: 17 mg/dL (ref 6–23)
CALCIUM: 9.3 mg/dL (ref 8.4–10.5)
CHLORIDE: 100 meq/L (ref 96–112)
CO2: 19 meq/L (ref 19–32)
Creatinine, Ser: 0.58 mg/dL (ref 0.50–1.10)
GLUCOSE: 280 mg/dL — AB (ref 70–99)
Potassium: 3.7 mEq/L (ref 3.7–5.3)
SODIUM: 136 meq/L — AB (ref 137–147)
Total Protein: 7.9 g/dL (ref 6.0–8.3)

## 2014-04-28 LAB — URINE MICROSCOPIC-ADD ON

## 2014-04-28 LAB — URINALYSIS, ROUTINE W REFLEX MICROSCOPIC
Glucose, UA: >1000 mg/dL — AB
KETONES UR: NEGATIVE mg/dL
LEUKOCYTES UA: NEGATIVE
NITRITE: NEGATIVE
PH: 5 (ref 5.0–8.0)
PROTEIN: 30 mg/dL — AB
Specific Gravity, Urine: >1.046 — ABNORMAL HIGH (ref 1.005–1.030)
UROBILINOGEN UA: 0.2 mg/dL (ref 0.0–1.0)

## 2014-04-28 LAB — LIPASE, BLOOD: Lipase: 20 U/L (ref 11–59)

## 2014-04-28 MED ORDER — LOPERAMIDE HCL 2 MG PO CAPS: 2.0000 mg | ORAL_CAPSULE | Freq: Four times a day (QID) | ORAL | Status: DC | PRN

## 2014-04-28 MED ORDER — ONDANSETRON 8 MG PO TBDP: 8.0000 mg | ORAL_TABLET | Freq: Three times a day (TID) | ORAL | Status: DC | PRN

## 2014-04-28 MED ORDER — DICYCLOMINE HCL 10 MG/ML IM SOLN
20.0000 mg | Freq: Once | INTRAMUSCULAR | Status: AC
  Administered 2014-04-28: 20 mg via INTRAMUSCULAR
  Filled 2014-04-28: qty 2

## 2014-04-28 MED ORDER — DICYCLOMINE HCL 20 MG PO TABS: 20.0000 mg | ORAL_TABLET | Freq: Four times a day (QID) | ORAL | Status: DC | PRN

## 2014-04-28 MED ORDER — ONDANSETRON HCL 4 MG/2ML IJ SOLN
4.0000 mg | Freq: Once | INTRAMUSCULAR | Status: AC
  Administered 2014-04-28: 4 mg via INTRAVENOUS
  Filled 2014-04-28: qty 2

## 2014-04-28 MED ORDER — FENTANYL CITRATE 0.05 MG/ML IJ SOLN: 50.0000 ug | Freq: Once | INTRAMUSCULAR | Status: DC

## 2014-04-28 MED ORDER — SODIUM CHLORIDE 0.9 % IV SOLN
INTRAVENOUS | Status: DC
  Administered 2014-04-28: 01:00:00 via INTRAVENOUS

## 2014-04-28 NOTE — Discharge Instructions (Signed)
Take medications as prescribed.  Hold off on using Imodium unless your diarrhea becomes excessive.  Stick to a bland diet.  Drink plenty of fluids.  Rest.  Follow-up with your doctor for recheck in 1 week when she were feeling better to discuss your elevated blood glucose levels.  Return to the emergency department for worsening condition or new concerning symptoms.   Diarrhea Diarrhea is frequent loose and watery bowel movements. It can cause you to feel weak and dehydrated. Dehydration can cause you to become tired and thirsty, have a dry mouth, and have decreased urination that often is dark yellow. Diarrhea is a sign of another problem, most often an infection that will not last long. In most cases, diarrhea typically lasts 2-3 days. However, it can last longer if it is a sign of something more serious. It is important to treat your diarrhea as directed by your caregiver to lessen or prevent future episodes of diarrhea. CAUSES  Some common causes include:  Gastrointestinal infections caused by viruses, bacteria, or parasites.  Food poisoning or food allergies.  Certain medicines, such as antibiotics, chemotherapy, and laxatives.  Artificial sweeteners and fructose.  Digestive disorders. HOME CARE INSTRUCTIONS  Ensure adequate fluid intake (hydration): Have 1 cup (8 oz) of fluid for each diarrhea episode. Avoid fluids that contain simple sugars or sports drinks, fruit juices, whole milk products, and sodas. Your urine should be clear or pale yellow if you are drinking enough fluids. Hydrate with an oral rehydration solution that you can purchase at pharmacies, retail stores, and online. You can prepare an oral rehydration solution at home by mixing the following ingredients together:   - tsp table salt.   tsp baking soda.   tsp salt substitute containing potassium chloride.  1  tablespoons sugar.  1 L (34 oz) of water.  Certain foods and beverages may increase the speed at which  food moves through the gastrointestinal (GI) tract. These foods and beverages should be avoided and include:  Caffeinated and alcoholic beverages.  High-fiber foods, such as raw fruits and vegetables, nuts, seeds, and whole grain breads and cereals.  Foods and beverages sweetened with sugar alcohols, such as xylitol, sorbitol, and mannitol.  Some foods may be well tolerated and may help thicken stool including:  Starchy foods, such as rice, toast, pasta, low-sugar cereal, oatmeal, grits, baked potatoes, crackers, and bagels.  Bananas.  Applesauce.  Add probiotic-rich foods to help increase healthy bacteria in the GI tract, such as yogurt and fermented milk products.  Wash your hands well after each diarrhea episode.  Only take over-the-counter or prescription medicines as directed by your caregiver.  Take a warm bath to relieve any burning or pain from frequent diarrhea episodes. SEEK IMMEDIATE MEDICAL CARE IF:   You are unable to keep fluids down.  You have persistent vomiting.  You have blood in your stool, or your stools are black and tarry.  You do not urinate in 6-8 hours, or there is only a small amount of very dark urine.  You have abdominal pain that increases or localizes.  You have weakness, dizziness, confusion, or light-headedness.  You have a severe headache.  Your diarrhea gets worse or does not get better.  You have a fever or persistent symptoms for more than 2-3 days.  You have a fever and your symptoms suddenly get worse. MAKE SURE YOU:   Understand these instructions.  Will watch your condition.  Will get help right away if you are not doing  well or get worse. Document Released: 05/05/2002 Document Revised: 09/29/2013 Document Reviewed: 01/21/2012 [facility] Patient Information 2015 [place], [place]. This information is not intended to replace advice given to you by your health care provider. Make sure you discuss any questions you have with your  health care provider.  Food Choices to Help Relieve Diarrhea When you have diarrhea, the foods you eat and your eating habits are very important. Choosing the right foods and drinks can help relieve diarrhea. Also, because diarrhea can last up to 7 days, you need to replace lost fluids and electrolytes (such as sodium, potassium, and chloride) in order to help prevent dehydration.  WHAT GENERAL GUIDELINES DO I NEED TO FOLLOW?  Slowly drink 1 cup (8 oz) of fluid for each episode of diarrhea. If you are getting enough fluid, your urine will be clear or pale yellow.  Eat starchy foods. Some good choices include white rice, white toast, pasta, low-fiber cereal, baked potatoes (without the skin), saltine crackers, and bagels.  Avoid large servings of any cooked vegetables.  Limit fruit to two servings per day. A serving is  cup or 1 small piece.  Choose foods with less than 2 g of fiber per serving.  Limit fats to less than 8 tsp (38 g) per day.  Avoid fried foods.  Eat foods that have probiotics in them. Probiotics can be found in certain dairy products.  Avoid foods and beverages that may increase the speed at which food moves through the stomach and intestines (gastrointestinal tract). Things to avoid include:  High-fiber foods, such as dried fruit, raw fruits and vegetables, nuts, seeds, and whole grain foods.  Spicy foods and high-fat foods.  Foods and beverages sweetened with high-fructose corn syrup, honey, or sugar alcohols such as xylitol, sorbitol, and mannitol. WHAT FOODS ARE RECOMMENDED? Grains White rice. White, [place], or pita breads (fresh or toasted), including plain rolls, buns, or bagels. White pasta. Saltine, soda, or graham crackers. Pretzels. Low-fiber cereal. Cooked cereals made with water (such as cornmeal, farina, or cream cereals). Plain muffins. Matzo. Melba toast. Zwieback.  Vegetables Potatoes (without the skin). Strained tomato and vegetable juices. Most  well-cooked and canned vegetables without seeds. Tender lettuce. Fruits Cooked or canned applesauce, apricots, cherries, fruit cocktail, grapefruit, peaches, pears, or plums. Fresh bananas, apples without skin, cherries, grapes, cantaloupe, grapefruit, peaches, oranges, or plums.  Meat and Other Protein Products Baked or boiled chicken. Eggs. Tofu. Fish. Seafood. Smooth peanut butter. Ground or well-cooked tender beef, ham, veal, lamb, pork, or poultry.  Dairy Plain yogurt, kefir, and unsweetened liquid yogurt. Lactose-free milk, buttermilk, or soy milk. Plain hard cheese. Beverages Sport drinks. Clear broths. Diluted fruit juices (except prune). Regular, caffeine-free sodas such as ginger ale. Water. Decaffeinated teas. Oral rehydration solutions. Sugar-free beverages not sweetened with sugar alcohols. Other Bouillon, broth, or soups made from recommended foods.  The items listed above may not be a complete list of recommended foods or beverages. Contact your dietitian for more options. WHAT FOODS ARE NOT RECOMMENDED? Grains Whole grain, whole wheat, bran, or rye breads, rolls, pastas, crackers, and cereals. Wild or brown rice. Cereals that contain more than 2 g of fiber per serving. Corn tortillas or taco shells. Cooked or dry oatmeal. Granola. Popcorn. Vegetables Raw vegetables. Cabbage, broccoli, Brussels sprouts, artichokes, baked beans, beet greens, corn, kale, legumes, peas, sweet potatoes, and yams. Potato skins. Cooked spinach and cabbage. Fruits Dried fruit, including raisins and dates. Raw fruits. Stewed or dried prunes. Fresh apples with skin, apricots,  mangoes, pears, raspberries, and strawberries.  Meat and Other Protein Products Chunky peanut butter. Nuts and seeds. Beans and lentils. Berniece Salines.  Dairy High-fat cheeses. Milk, chocolate milk, and beverages made with milk, such as milk shakes. Cream. Ice cream. Sweets and Desserts Sweet rolls, doughnuts, and sweet breads. Pancakes  and waffles. Fats and Oils Butter. Cream sauces. Margarine. Salad oils. Plain salad dressings. Olives. Avocados.  Beverages Caffeinated beverages (such as coffee, tea, soda, or energy drinks). Alcoholic beverages. Fruit juices with pulp. Prune juice. Soft drinks sweetened with high-fructose corn syrup or sugar alcohols. Other Coconut. Hot sauce. Chili powder. Mayonnaise. Gravy. Cream-based or milk-based soups.  The items listed above may not be a complete list of foods and beverages to avoid. Contact your dietitian for more information. WHAT SHOULD I DO IF I BECOME DEHYDRATED? Diarrhea can sometimes lead to dehydration. Signs of dehydration include dark urine and dry mouth and skin. If you think you are dehydrated, you should rehydrate with an oral rehydration solution. These solutions can be purchased at pharmacies, retail stores, or online.  Drink -1 cup (120-240 mL) of oral rehydration solution each time you have an episode of diarrhea. If drinking this amount makes your diarrhea worse, try drinking smaller amounts more often. For example, drink 1-3 tsp (5-15 mL) every 5-10 minutes.  A general rule for staying hydrated is to drink 1-2 L of fluid per day. Talk to your health care provider about the specific amount you should be drinking each day. Drink enough fluids to keep your urine clear or pale yellow. Document Released: 08/05/2003 Document Revised: 05/20/2013 Document Reviewed: 04/07/2013 [facility] Patient Information 2015 [place], [place]. This information is not intended to replace advice given to you by your health care provider. Make sure you discuss any questions you have with your health care provider.  Hyperglycemia Hyperglycemia occurs when the glucose (sugar) in your blood is too high. Hyperglycemia can happen for many reasons, but it most often happens to people who do not know they have diabetes or are not managing their diabetes properly.  CAUSES  Whether you have diabetes or  not, there are other causes of hyperglycemia. Hyperglycemia can occur when you have diabetes, but it can also occur in other situations that you might not be as aware of, such as: Diabetes  If you have diabetes and are having problems controlling your blood glucose, hyperglycemia could occur because of some of the following reasons:  Not following your meal plan.  Not taking your diabetes medications or not taking it properly.  Exercising less or doing less activity than you normally do.  Being sick. Pre-diabetes  This cannot be ignored. Before people develop Type 2 diabetes, they almost always have "pre-diabetes." This is when your blood glucose levels are higher than normal, but not yet high enough to be diagnosed as diabetes. Research has shown that some long-term damage to the body, especially the heart and circulatory system, may already be occurring during pre-diabetes. If you take action to manage your blood glucose when you have pre-diabetes, you may delay or prevent Type 2 diabetes from developing. Stress  If you have diabetes, you may be "diet" controlled or on oral medications or insulin to control your diabetes. However, you may find that your blood glucose is higher than usual in the hospital whether you have diabetes or not. This is often referred to as "stress hyperglycemia." Stress can elevate your blood glucose. This happens because of hormones put out by the body during times of  stress. If stress has been the cause of your high blood glucose, it can be followed regularly by your caregiver. That way he/she can make sure your hyperglycemia does not continue to get worse or progress to diabetes. Steroids  Steroids are medications that act on the infection fighting system (immune system) to block inflammation or infection. One side effect can be a rise in blood glucose. Most people can produce enough extra insulin to allow for this rise, but for those who cannot, steroids make blood  glucose levels go even higher. It is not unusual for steroid treatments to "uncover" diabetes that is developing. It is not always possible to determine if the hyperglycemia will go away after the steroids are stopped. A special blood test called an A1c is sometimes done to determine if your blood glucose was elevated before the steroids were started. SYMPTOMS  Thirsty.  Frequent urination.  Dry mouth.  Blurred vision.  Tired or fatigue.  Weakness.  Sleepy.  Tingling in feet or leg. DIAGNOSIS  Diagnosis is made by monitoring blood glucose in one or all of the following ways:  A1c test. This is a chemical found in your blood.  Fingerstick blood glucose monitoring.  Laboratory results. TREATMENT  First, knowing the cause of the hyperglycemia is important before the hyperglycemia can be treated. Treatment may include, but is not be limited to:  Education.  Change or adjustment in medications.  Change or adjustment in meal plan.  Treatment for an illness, infection, etc.  More frequent blood glucose monitoring.  Change in exercise plan.  Decreasing or stopping steroids.  Lifestyle changes. HOME CARE INSTRUCTIONS   Test your blood glucose as directed.  Exercise regularly. Your caregiver will give you instructions about exercise. Pre-diabetes or diabetes which comes on with stress is helped by exercising.  Eat wholesome, balanced meals. Eat often and at regular, fixed times. Your caregiver or nutritionist will give you a meal plan to guide your sugar intake.  Being at an ideal weight is important. If needed, losing as little as 10 to 15 pounds may help improve blood glucose levels. SEEK MEDICAL CARE IF:   You have questions about medicine, activity, or diet.  You continue to have symptoms (problems such as increased thirst, urination, or weight gain). SEEK IMMEDIATE MEDICAL CARE IF:   You are vomiting or have diarrhea.  Your breath smells fruity.  You are  breathing faster or slower.  You are very sleepy or incoherent.  You have numbness, tingling, or pain in your feet or hands.  You have chest pain.  Your symptoms get worse even though you have been following your caregiver's orders.  If you have any other questions or concerns. Document Released: 11/08/2000 Document Revised: 08/07/2011 Document Reviewed: 09/11/2011 [facility] Patient Information 2015 [place], [place]. This information is not intended to replace advice given to you by your health care provider. Make sure you discuss any questions you have with your health care provider.   Nausea and Vomiting Nausea is a sick feeling that often comes before throwing up (vomiting). Vomiting is a reflex where stomach contents come out of your mouth. Vomiting can cause severe loss of body fluids (dehydration). Children and elderly adults can become dehydrated quickly, especially if they also have diarrhea. Nausea and vomiting are symptoms of a condition or disease. It is important to find the cause of your symptoms. CAUSES   Direct irritation of the stomach lining. This irritation can result from increased acid production (gastroesophageal reflux disease), infection,  food poisoning, taking certain medicines (such as nonsteroidal anti-inflammatory drugs), alcohol use, or tobacco use.  Signals from the brain.These signals could be caused by a headache, heat exposure, an inner ear disturbance, increased pressure in the brain from injury, infection, a tumor, or a concussion, pain, emotional stimulus, or metabolic problems.  An obstruction in the gastrointestinal tract (bowel obstruction).  Illnesses such as diabetes, hepatitis, gallbladder problems, appendicitis, kidney problems, cancer, sepsis, atypical symptoms of a heart attack, or eating disorders.  Medical treatments such as chemotherapy and radiation.  Receiving medicine that makes you sleep (general anesthetic) during  surgery. DIAGNOSIS Your caregiver may ask for tests to be done if the problems do not improve after a few days. Tests may also be done if symptoms are severe or if the reason for the nausea and vomiting is not clear. Tests may include:  Urine tests.  Blood tests.  Stool tests.  Cultures (to look for evidence of infection).  X-rays or other imaging studies. Test results can help your caregiver make decisions about treatment or the need for additional tests. TREATMENT You need to stay well hydrated. Drink frequently but in small amounts.You may wish to drink water, sports drinks, clear broth, or eat frozen ice pops or gelatin dessert to help stay hydrated.When you eat, eating slowly may help prevent nausea.There are also some antinausea medicines that may help prevent nausea. HOME CARE INSTRUCTIONS   Take all medicine as directed by your caregiver.  If you do not have an appetite, do not force yourself to eat. However, you must continue to drink fluids.  If you have an appetite, eat a normal diet unless your caregiver tells you differently.  Eat a variety of complex carbohydrates (rice, wheat, potatoes, bread), lean meats, yogurt, fruits, and vegetables.  Avoid high-fat foods because they are more difficult to digest.  Drink enough water and fluids to keep your urine clear or pale yellow.  If you are dehydrated, ask your caregiver for specific rehydration instructions. Signs of dehydration may include:  Severe thirst.  Dry lips and mouth.  Dizziness.  Dark urine.  Decreasing urine frequency and amount.  Confusion.  Rapid breathing or pulse. SEEK IMMEDIATE MEDICAL CARE IF:   You have blood or brown flecks (like coffee grounds) in your vomit.  You have black or bloody stools.  You have a severe headache or stiff neck.  You are confused.  You have severe abdominal pain.  You have chest pain or trouble breathing.  You do not urinate at least once every 8  hours.  You develop cold or clammy skin.  You continue to vomit for longer than 24 to 48 hours.  You have a fever. MAKE SURE YOU:   Understand these instructions.  Will watch your condition.  Will get help right away if you are not doing well or get worse. Document Released: 05/15/2005 Document Revised: 08/07/2011 Document Reviewed: 10/12/2010 [facility] Patient Information 2015 [place], [place]. This information is not intended to replace advice given to you by your health care provider. Make sure you discuss any questions you have with your health care provider.  Viral Gastroenteritis Viral gastroenteritis is also known as stomach flu. This condition affects the stomach and intestinal tract. It can cause sudden diarrhea and vomiting. The illness typically lasts 3 to 8 days. Most people develop an immune response that eventually gets rid of the virus. While this natural response develops, the virus can make you quite ill. CAUSES  Many different viruses can cause  gastroenteritis, such as rotavirus or noroviruses. You can catch one of these viruses by consuming contaminated food or water. You may also catch a virus by sharing utensils or other personal items with an infected person or by touching a contaminated surface. SYMPTOMS  The most common symptoms are diarrhea and vomiting. These problems can cause a severe loss of body fluids (dehydration) and a body salt (electrolyte) imbalance. Other symptoms may include:  Fever.  Headache.  Fatigue.  Abdominal pain. DIAGNOSIS  Your caregiver can usually diagnose viral gastroenteritis based on your symptoms and a physical exam. A stool sample may also be taken to test for the presence of viruses or other infections. TREATMENT  This illness typically goes away on its own. Treatments are aimed at rehydration. The most serious cases of viral gastroenteritis involve vomiting so severely that you are not able to keep fluids down. In these cases,  fluids must be given through an intravenous line (IV). HOME CARE INSTRUCTIONS   Drink enough fluids to keep your urine clear or pale yellow. Drink small amounts of fluids frequently and increase the amounts as tolerated.  Ask your caregiver for specific rehydration instructions.  Avoid:  Foods high in sugar.  Alcohol.  Carbonated drinks.  Tobacco.  Juice.  Caffeine drinks.  Extremely hot or cold fluids.  Fatty, greasy foods.  Too much intake of anything at one time.  Dairy products until 24 to 48 hours after diarrhea stops.  You may consume probiotics. Probiotics are active cultures of beneficial bacteria. They may lessen the amount and number of diarrheal stools in adults. Probiotics can be found in yogurt with active cultures and in supplements.  Wash your hands well to avoid spreading the virus.  Only take over-the-counter or prescription medicines for pain, discomfort, or fever as directed by your caregiver. Do not give aspirin to children. Antidiarrheal medicines are not recommended.  Ask your caregiver if you should continue to take your regular prescribed and over-the-counter medicines.  Keep all follow-up appointments as directed by your caregiver. SEEK IMMEDIATE MEDICAL CARE IF:   You are unable to keep fluids down.  You do not urinate at least once every 6 to 8 hours.  You develop shortness of breath.  You notice blood in your stool or vomit. This may look like coffee grounds.  You have abdominal pain that increases or is concentrated in one small area (localized).  You have persistent vomiting or diarrhea.  You have a fever.  The patient is a child younger than 3 months, and he or she has a fever.  The patient is a child older than 3 months, and he or she has a fever and persistent symptoms.  The patient is a child older than 3 months, and he or she has a fever and symptoms suddenly get worse.  The patient is a baby, and he or she has no tears  when crying. MAKE SURE YOU:   Understand these instructions.  Will watch your condition.  Will get help right away if you are not doing well or get worse. Document Released: 05/15/2005 Document Revised: 08/07/2011 Document Reviewed: 03/01/2011 [facility] Patient Information 2015 [place], [place]. This information is not intended to replace advice given to you by your health care provider. Make sure you discuss any questions you have with your health care provider.

## 2014-04-28 NOTE — ED Notes (Signed)
Per EMS, patient is complaining of upper abd pain that radiates around to her back. Tenderness noted to bilateral flank area but mostly on left side. Also, experiencing nausea and diarrhea. Patient had Zofran 4mg  IV and Fentanyl 250mg  IV en route.

## 2014-04-28 NOTE — ED Notes (Signed)
EKG given to EDP otter for review

## 2014-04-28 NOTE — ED Notes (Signed)
Patient has vomited once but patient does not want any antiemetic medication.

## 2014-04-28 NOTE — ED Notes (Signed)
Bed: WA09 Expected date:  Expected time:  Means of arrival:  Comments: EMS 53F lower abd pain

## 2014-04-28 NOTE — ED Notes (Signed)
Provided sprite with permission from Dr. Sharol Given.

## 2014-04-28 NOTE — ED Notes (Signed)
Patient is resting quietly with eyes closed. Appears in no respiratory distress. Patient will open eyes when talking directly to her.

## 2014-04-28 NOTE — ED Provider Notes (Signed)
CSN: [id number]     Arrival date & time 04/28/14  0028 History   First MD Initiated Contact with Patient 04/28/14 0046     Chief Complaint  Patient presents with  . Abdominal Pain     (Consider location/radiation/quality/duration/timing/severity/associated sxs/prior Treatment) HPI 48 yo female presents to the ER from home with complain of diffuse upper abdominal pain, n/v/d, bloating starting this evening around 10 pm.  She denies any sick contacts, no fevers.  H/o HTN.  Remote history of cervical cancer, uterine ablation, cardiac ablation, and c/s x2 Past Medical History  Diagnosis Date  . Hypertension   . Cancer     cervical 1989   Past Surgical History  Procedure Laterality Date  . Cardiac catheterization    . Cardiac electrophysiology study and ablation    . Endometrial ablation     Family History  Problem Relation Age of Onset  . Cancer Mother   . Cancer Father   . Stroke Brother   . Hypertension Brother    History  Substance Use Topics  . Smoking status: Never Smoker   . Smokeless tobacco: Never Used  . Alcohol Use: No   OB History    Gravida Para Term Preterm AB TAB SAB Ectopic Multiple Living   3 2 2  1  1   2      Review of Systems  All other systems reviewed and are negative.     Allergies  Review of patient's allergies indicates no known allergies.  Home Medications   Prior to Admission medications   Medication Sig Start Date End Date Taking? Authorizing Provider  Multiple Vitamins-Minerals (ONE-A-DAY WEIGHT SMART ADVANCE PO) Take 1 tablet by mouth daily.   Yes Historical Provider, MD  aspirin-acetaminophen-caffeine (EXCEDRIN MIGRAINE) [phone] MG per tablet Take 2 tablets by mouth as needed. For migraine    Historical Provider, MD  HYDROcodone-acetaminophen (NORCO) 5-325 MG per tablet Take 2 tablets by mouth every 4 (four) hours as needed for pain. Patient not taking: Reported on 04/28/2014 10/06/12   Veryl Speak, MD  predniSONE (DELTASONE) 10 MG  tablet Take 2 tablets (20 mg total) by mouth 2 (two) times daily. Patient not taking: Reported on 04/28/2014 10/06/12   Veryl Speak, MD   BP 133/88 mmHg  Pulse 64  Temp(Src) 97.5 F (36.4 C) (Oral)  Resp 15  Ht 5\' 4"  (1.626 m)  Wt 305 lb (138.347 kg)  BMI 52.33 kg/m2  SpO2 99% Physical Exam  Constitutional: She is oriented to person, place, and time. She appears well-developed and well-nourished. She appears distressed.  HENT:  Head: Normocephalic and atraumatic.  Nose: Nose normal.  Mouth/Throat: Oropharynx is clear and moist.  Eyes: Conjunctivae and EOM are normal. Pupils are equal, round, and reactive to light.  Neck: Normal range of motion. Neck supple. No JVD present. No tracheal deviation present. No thyromegaly present.  Cardiovascular: Normal rate, regular rhythm, normal heart sounds and intact distal pulses.  Exam reveals no gallop and no friction rub.   No murmur heard. Pulmonary/Chest: Effort normal and breath sounds normal. No stridor. No respiratory distress. She has no wheezes. She has no rales. She exhibits no tenderness.  Abdominal: Soft. She exhibits no distension and no mass. There is tenderness (Patient has diffuse upper abdominal pain worse in left upper quadrant.  Bowel sounds are hyperactive). There is no rebound and no guarding.  Musculoskeletal: Normal range of motion. She exhibits no edema or tenderness.  Lymphadenopathy:    She has no cervical adenopathy.  Neurological: She is alert and oriented to person, place, and time. She displays normal reflexes. She exhibits normal muscle tone. Coordination normal.  Skin: Skin is warm and dry. No rash noted. No erythema. No pallor.  Psychiatric: She has a normal mood and affect. Her behavior is normal. Judgment and thought content normal.  Nursing note and vitals reviewed.   ED Course  Procedures (including critical care time) Labs Review Labs Reviewed  COMPREHENSIVE METABOLIC PANEL - Abnormal; Notable for the  following:    Sodium 136 (*)    Glucose, Bld 280 (*)    Anion gap 17 (*)    All other components within normal limits  CBC WITH DIFFERENTIAL - Abnormal; Notable for the following:    WBC 12.0 (*)    Monocytes Absolute 1.1 (*)    All other components within normal limits  URINALYSIS, ROUTINE W REFLEX MICROSCOPIC - Abnormal; Notable for the following:    Color, Urine AMBER (*)    APPearance CLOUDY (*)    Specific Gravity, Urine >1.046 (*)    Glucose, UA >1000 (*)    Hgb urine dipstick TRACE (*)    Bilirubin Urine SMALL (*)    Protein, ur 30 (*)    All other components within normal limits  URINE MICROSCOPIC-ADD ON - Abnormal; Notable for the following:    Squamous Epithelial / LPF MANY (*)    Bacteria, UA MANY (*)    Casts HYALINE CASTS (*)    Crystals CA OXALATE CRYSTALS (*)    All other components within normal limits  LIPASE, BLOOD    Imaging Review No results found.   EKG Interpretation None      MDM   Final diagnoses:  Nausea vomiting and diarrhea  Gastroenteritis  Hyperglycemia    48 year old female with acute onset of upper abdominal pain associated with nausea, vomiting, diarrhea.  Plan for pain and nausea control, IV fluids, lab work.  Suspect gastroenteritis.   4:14 AM] Patient reports that she is feeling better, still feels tight and bloated, but no further sharp abdominal pain, nausea or vomiting.  Patient informed of elevated blood glucose.  Will have her follow-up with her primary care Dr. for further workup to include A1c.  We'll not start any medications.  Given the acute illness that she is in right now.  Patient is stable for discharge home.  Kalman Drape, MD 04/28/14 [phone]

## 2014-06-16 ENCOUNTER — Encounter: Payer: Self-pay | Admitting: Family Medicine

## 2014-08-04 ENCOUNTER — Encounter: Payer: Self-pay | Admitting: Cardiovascular Disease

## 2014-08-04 ENCOUNTER — Ambulatory Visit (INDEPENDENT_AMBULATORY_CARE_PROVIDER_SITE_OTHER): Payer: BLUE CROSS/BLUE SHIELD | Admitting: Cardiovascular Disease

## 2014-08-04 ENCOUNTER — Encounter: Payer: Self-pay | Admitting: *Deleted

## 2014-08-04 VITALS — BP 140/92 | HR 92 | Ht 64.0 in | Wt 300.0 lb

## 2014-08-04 DIAGNOSIS — R0602 Shortness of breath: Secondary | ICD-10-CM

## 2014-08-04 DIAGNOSIS — R002 Palpitations: Secondary | ICD-10-CM | POA: Insufficient documentation

## 2014-08-04 DIAGNOSIS — E119 Type 2 diabetes mellitus without complications: Secondary | ICD-10-CM | POA: Insufficient documentation

## 2014-08-04 DIAGNOSIS — R030 Elevated blood-pressure reading, without diagnosis of hypertension: Secondary | ICD-10-CM

## 2014-08-04 DIAGNOSIS — IMO0001 Reserved for inherently not codable concepts without codable children: Secondary | ICD-10-CM

## 2014-08-04 DIAGNOSIS — E785 Hyperlipidemia, unspecified: Secondary | ICD-10-CM

## 2014-08-04 DIAGNOSIS — R079 Chest pain, unspecified: Secondary | ICD-10-CM

## 2014-08-04 NOTE — Progress Notes (Signed)
Patient ID: Christina Cameron, female   DOB: [birth year], 49 y.o.   MRN: [id number]       CARDIOLOGY CONSULT NOTE  Patient ID: Christina Cameron MRN: [id number] DOB/AGE: 1965-08-24 49 y.o.  Admit date: (Not on file) Primary Physician HAWKINS,EDWARD L, MD  Reason for Consultation: shortness of breath and palpitations  HPI: The patient is a 49 year old woman who has been referred for the evaluation of shortness of breath and palpitations. She underwent a normal cardiac catheterization in February 2011. She had normal left ventricular systolic function at that time.  She reportedly underwent an EP study with ablation by Dr. Lovena Le in either 2005 are 2006 for palpitations and a heart rate of 160 bpm (SVT?). I do not have these records. She was evaluated by her primary care physician on 07/28/14 at which time she was complaining of fatigue, shortness of breath, and chest tightness. She is morbidly obese and weighs 300 pounds. She was recently diagnosed with poorly controlled type 2 diabetes mellitus. She also has a history of anxiety and depression. ECG on 04/28/14 demonstrated normal sinus rhythm.  She tells me that she has had worsening palpitations over the past one year and they are always worse when she significantly exerts herself. They occur at least 3 times per week. She has had chest tightness which she describes as "an elephant sitting on my chest". She said she had an "abnormal breathing test" at her primary care physician's office and is scheduled to undergo a chest CT. She has had bilateral swelling of her hands and feet. When her heart races, she has chest tightness and shortness of breath as well. She denies syncope.    No Known Allergies  Current Outpatient Prescriptions  Medication Sig Dispense Refill  . aspirin-acetaminophen-caffeine (EXCEDRIN MIGRAINE) 250-250-65 MG per tablet Take 2 tablets by mouth as needed. For migraine    . dicyclomine (BENTYL) 20 MG tablet Take 1 tablet (20 mg total)  by mouth every 6 (six) hours as needed for spasms (for abdominal cramping). 20 tablet 0  . loperamide (IMODIUM) 2 MG capsule Take 1 capsule (2 mg total) by mouth 4 (four) times daily as needed for diarrhea or loose stools. 12 capsule 0  . Multiple Vitamins-Minerals (ONE-A-DAY WEIGHT SMART ADVANCE PO) Take 1 tablet by mouth daily.    . ondansetron (ZOFRAN ODT) 8 MG disintegrating tablet Take 1 tablet (8 mg total) by mouth every 8 (eight) hours as needed for nausea or vomiting. 20 tablet 0   No current facility-administered medications for this visit.    Past Medical History  Diagnosis Date  . Hypertension   . Cancer     cervical 1989    Past Surgical History  Procedure Laterality Date  . Cardiac catheterization    . Cardiac electrophysiology study and ablation    . Endometrial ablation      History   Social History  . Marital Status: Married    Spouse Name: N/A  . Number of Children: N/A  . Years of Education: N/A   Occupational History  . Not on file.   Social History Main Topics  . Smoking status: Never Smoker   . Smokeless tobacco: Never Used  . Alcohol Use: No  . Drug Use: No  . Sexual Activity: Yes    Birth Control/ Protection: None   Other Topics Concern  . Not on file   Social History Narrative     No family history of premature CAD in 1st degree relatives.  Prior to Admission medications   Medication Sig Start Date End Date Taking? Authorizing Provider  aspirin-acetaminophen-caffeine (EXCEDRIN MIGRAINE) [phone] MG per tablet Take 2 tablets by mouth as needed. For migraine    Historical Provider, MD  dicyclomine (BENTYL) 20 MG tablet Take 1 tablet (20 mg total) by mouth every 6 (six) hours as needed for spasms (for abdominal cramping). 04/28/14   Linton Flemings, MD  loperamide (IMODIUM) 2 MG capsule Take 1 capsule (2 mg total) by mouth 4 (four) times daily as needed for diarrhea or loose stools. 04/28/14   Linton Flemings, MD  Multiple Vitamins-Minerals (ONE-A-DAY  WEIGHT SMART ADVANCE PO) Take 1 tablet by mouth daily.    Historical Provider, MD  ondansetron (ZOFRAN ODT) 8 MG disintegrating tablet Take 1 tablet (8 mg total) by mouth every 8 (eight) hours as needed for nausea or vomiting. 04/28/14   Linton Flemings, MD     Review of systems complete and found to be negative unless listed above in HPI     Physical exam Height 5\' 4"  (1.626 m), weight 300 lb (136.079 kg). General: NAD, obese Neck: No JVD, no thyromegaly or thyroid nodule.  Lungs: Clear to auscultation bilaterally with normal respiratory effort. CV: Nondisplaced PMI. Regular rate and rhythm, normal S1/S2, no S3/S4, no murmur.  No peripheral edema.  No carotid bruit.  Normal pedal pulses.  Abdomen: Soft, morbidly obese.  Skin: Intact without lesions or rashes.  Neurologic: Alert and oriented x 3.  Psych: Normal affect. Extremities: No clubbing or cyanosis.  HEENT: Normal.   ECG: Most recent ECG reviewed.  Labs:   Lab Results  Component Value Date   WBC 12.0* 04/28/2014   HGB 14.7 04/28/2014   HCT 42.5 04/28/2014   MCV 86.9 04/28/2014   PLT 258 04/28/2014   No results for input(s): NA, K, CL, CO2, BUN, CREATININE, CALCIUM, PROT, BILITOT, ALKPHOS, ALT, AST, GLUCOSE in the last 168 hours.  Invalid input(s): LABALBU Lab Results  Component Value Date   TROPONINI <0.30 10/06/2012   No results found for: CHOL No results found for: HDL No results found for: LDLCALC No results found for: TRIG No results found for: CHOLHDL No results found for: LDLDIRECT       Studies: No results found.  ASSESSMENT AND PLAN:  1. Chest tightness with shortness of breath and palpitations: Her ECG was normal in December 2015. Given her normal cardiac catheterization in 2011, while it is possible she has had the development of accelerated coronary artery disease it is unlikely. However, given her significant symptoms and comorbidities including poorly controlled diabetes mellitus and morbid obesity,  I will obtain a Lexiscan Cardiolite stress test to evaluate for ischemia. I will obtain an echocardiogram to evaluate for structural heart disease given her palpitations. I will also obtain a 3 week event monitor. I will not initiate medical therapy but would consider diltiazem in the future should her monitor show a tachyarrhythmia.  2. Elevated BP: I will monitor this for now but should she demonstrate persistently elevated BP's, I would consider the addition of lisinopril given her concomitant diabetes.  3. Hyperlipidemia: Continue pravastatin.  Dispo: f/u 6 weeks.  Signed: Kate Sable, M.D., F.A.C.C.  08/04/2014, 3:09 PM

## 2014-08-04 NOTE — Patient Instructions (Addendum)
Your physician has requested that you have an echocardiogram. Echocardiography is a painless test that uses sound waves to create images of your heart. It provides your doctor with information about the size and shape of your heart and how well your heart's chambers and valves are working. This procedure takes approximately one hour. There are no restrictions for this procedure. Your physician has requested that you have a lexiscan myoview. For further information please visit [email or web address]. Please follow instruction sheet, as given. Your physician has recommended that you wear a 21 day event monitor. Event monitors are medical devices that record the heart's electrical activity. Doctors most often [place] these monitors to diagnose arrhythmias. Arrhythmias are problems with the speed or rhythm of the heartbeat. The monitor is a small, portable device. You can wear one while you do your normal daily activities. This is usually used to diagnose what is causing palpitations/syncope (passing out). Office will contact with results via phone or letter.   Continue all current medications. Follow up in  6 weeks.

## 2014-08-10 ENCOUNTER — Other Ambulatory Visit: Payer: Self-pay | Admitting: *Deleted

## 2014-08-10 DIAGNOSIS — R002 Palpitations: Secondary | ICD-10-CM

## 2014-08-13 ENCOUNTER — Encounter: Payer: Self-pay | Admitting: Cardiovascular Disease

## 2014-08-14 ENCOUNTER — Ambulatory Visit (HOSPITAL_COMMUNITY)
Admission: RE | Admit: 2014-08-14 | Discharge: 2014-08-14 | Disposition: A | Discharge status: Home / Self Care | Payer: BLUE CROSS/BLUE SHIELD | Source: Ambulatory Visit | Attending: Cardiovascular Disease | Admitting: Cardiovascular Disease

## 2014-08-14 ENCOUNTER — Encounter (HOSPITAL_COMMUNITY)
Admission: RE | Admit: 2014-08-14 | Discharge: 2014-08-14 | Disposition: A | Discharge status: Home / Self Care | Payer: BLUE CROSS/BLUE SHIELD | Source: Ambulatory Visit | Attending: Cardiovascular Disease | Admitting: Cardiovascular Disease

## 2014-08-14 ENCOUNTER — Encounter (HOSPITAL_COMMUNITY): Payer: Self-pay

## 2014-08-14 DIAGNOSIS — R0789 Other chest pain: Secondary | ICD-10-CM | POA: Diagnosis present

## 2014-08-14 DIAGNOSIS — R0602 Shortness of breath: Secondary | ICD-10-CM | POA: Insufficient documentation

## 2014-08-14 DIAGNOSIS — R002 Palpitations: Secondary | ICD-10-CM | POA: Diagnosis not present

## 2014-08-14 DIAGNOSIS — R079 Chest pain, unspecified: Secondary | ICD-10-CM | POA: Insufficient documentation

## 2014-08-14 DIAGNOSIS — R0600 Dyspnea, unspecified: Secondary | ICD-10-CM | POA: Diagnosis not present

## 2014-08-14 MED ORDER — TECHNETIUM TC 99M SESTAMIBI - CARDIOLITE
30.0000 | Freq: Once | INTRAVENOUS | Status: AC | PRN
  Administered 2014-08-14: 12:00:00 30 via INTRAVENOUS

## 2014-08-14 MED ORDER — REGADENOSON 0.4 MG/5ML IV SOLN: 0.4000 mg | Freq: Once | INTRAVENOUS | Status: AC | PRN

## 2014-08-14 MED ORDER — SODIUM CHLORIDE 0.9 % IJ SOLN
INTRAMUSCULAR | Status: AC
  Filled 2014-08-14: qty 36

## 2014-08-14 MED ORDER — REGADENOSON 0.4 MG/5ML IV SOLN
INTRAVENOUS | Status: AC
  Administered 2014-08-14: 0.4 mg via INTRAVENOUS
  Filled 2014-08-14: qty 5

## 2014-08-14 MED ORDER — SODIUM CHLORIDE 0.9 % IJ SOLN
10.0000 mL | INTRAMUSCULAR | Status: DC | PRN
  Filled 2014-08-14: qty 10

## 2014-08-14 MED ORDER — SODIUM CHLORIDE 0.9 % IJ SOLN
INTRAMUSCULAR | Status: AC
  Administered 2014-08-14: 10 mL via INTRAVENOUS
  Filled 2014-08-14: qty 3

## 2014-08-14 MED ORDER — TECHNETIUM TC 99M SESTAMIBI GENERIC - CARDIOLITE
10.0000 | Freq: Once | INTRAVENOUS | Status: AC | PRN
  Administered 2014-08-14: 10 via INTRAVENOUS

## 2014-08-14 NOTE — Progress Notes (Signed)
  Echocardiogram 2D Echocardiogram has been performed.  Christina Cameron 08/14/2014, 10:15 AM

## 2014-08-14 NOTE — Progress Notes (Signed)
[name] 08/14/2014 Reason for doing test: Chest Pain and Dyspnea Type of test: Wille Glaser Nurse performing test: Gerrit Halls, RN Nuclear Medicine Tech: Redmond Baseman Echo Tech: Not Applicable MD performing test: Koneswaran/K.Purcell Nails NP Family MD: Luan Pulling Test explained and consent signed: Yes.   IV started: Saline lock flushed, No redness or edema and Saline lock started in radiology Symptoms: nausea & headach Treatment/Intervention: None Reason test stopped: protocol completed After recovery IV was: Discontinued via X-ray tech and No redness or edema Patient to return to Nuc. Med at : 12:30 Patient discharged: Home Patient's Condition upon discharge was: stable Comments: During test BP 117/74 & HR 99 .  Recovery BP 132/74 & HR 78.   Symptoms resolved in recovery. [name] T

## 2014-08-17 ENCOUNTER — Telehealth: Payer: Self-pay | Admitting: *Deleted

## 2014-08-17 NOTE — Telephone Encounter (Signed)
Notes Recorded by Laurine Blazer, LPN on 06/14/4351 at 9:12 AM Patient notified and verbalized understanding. Follow up already scheduled for 09/08/2014 with Dr. Bronson Ing.

## 2014-08-17 NOTE — Telephone Encounter (Signed)
STRESS TEST -   Notes Recorded by Herminio Commons, MD on 08/14/2014 at 5:58 PM Possible abnormalities. Will discuss at ov.  ECHO -   Notes Recorded by Herminio Commons, MD on 08/14/2014 at 1:28 PM Normal pumping function. Right-sided chamber enlargement due to morbid obesity. May have sleep apnea and probable obesity-hypoventilation syndrome. Needs weight loss and evaluation for sleep apnea. Will await monitor results.

## 2014-08-20 DIAGNOSIS — R002 Palpitations: Secondary | ICD-10-CM | POA: Diagnosis not present

## 2014-08-25 ENCOUNTER — Telehealth: Payer: Self-pay | Admitting: Cardiovascular Disease

## 2014-08-25 NOTE — Telephone Encounter (Signed)
Agree with you. Will just wait to see what monitor shows and will ascertain more history from her when I see her regarding chest pressure.

## 2014-08-25 NOTE — Telephone Encounter (Signed)
Discussed with patient.  Advised to keep her appointment for 09/08/14 instead of pushing out farther.  Stated she was still having some chest pressure, but new there were a lot of factors to be causing this.  Her monitor is not due to come off till 09/10/14.  Any further suggestions in the meantime?

## 2014-08-25 NOTE — Telephone Encounter (Signed)
Patient notified

## 2014-09-08 ENCOUNTER — Ambulatory Visit (INDEPENDENT_AMBULATORY_CARE_PROVIDER_SITE_OTHER): Payer: BLUE CROSS/BLUE SHIELD | Admitting: Cardiovascular Disease

## 2014-09-08 ENCOUNTER — Encounter: Payer: Self-pay | Admitting: Cardiovascular Disease

## 2014-09-08 VITALS — BP 152/100 | HR 82 | Ht 61.0 in | Wt 296.0 lb

## 2014-09-08 DIAGNOSIS — R002 Palpitations: Secondary | ICD-10-CM | POA: Diagnosis not present

## 2014-09-08 DIAGNOSIS — E785 Hyperlipidemia, unspecified: Secondary | ICD-10-CM

## 2014-09-08 DIAGNOSIS — R079 Chest pain, unspecified: Secondary | ICD-10-CM

## 2014-09-08 DIAGNOSIS — R0609 Other forms of dyspnea: Secondary | ICD-10-CM

## 2014-09-08 DIAGNOSIS — I517 Cardiomegaly: Secondary | ICD-10-CM

## 2014-09-08 DIAGNOSIS — R072 Precordial pain: Secondary | ICD-10-CM | POA: Insufficient documentation

## 2014-09-08 DIAGNOSIS — I10 Essential (primary) hypertension: Secondary | ICD-10-CM

## 2014-09-08 MED ORDER — LISINOPRIL-HYDROCHLOROTHIAZIDE 10-12.5 MG PO TABS: 1.0000 | ORAL_TABLET | Freq: Every day | ORAL | Status: DC

## 2014-09-08 NOTE — Patient Instructions (Signed)
   Begin Prinzide 10/12.5mg  daily - new sent to pharm today. Continue all other medications.   Follow up in  3 weeks

## 2014-09-08 NOTE — Progress Notes (Signed)
Patient ID: Christina Cameron, female   DOB: 05-04-1966, 49 y.o.   MRN: [id number]      SUBJECTIVE: The patient presents for follow-up after undergoing cardiovascular testing performed for evaluation of chest tightness, shortness of breath, and palpitations. Nuclear stress testing demonstrated possible mild anterior wall ischemia. However, soft tissue attenuation artifact could not entirely be ruled out. It was a low risk study. Echocardiogram demonstrated normal left ventricular systolic function and regional wall motion, EF 55-60%, with mild right atrial and right ventricular enlargement. She is still wearing an event monitor.  She continues to experience chest tightness and exertional dyspnea, as well as tightness of her hands and feet. Preliminary reviews of her event monitor demonstrated primarily sinus rhythm with no tachyarrhythmias.    Review of Systems: As per "subjective", otherwise negative.  No Known Allergies  Current Outpatient Prescriptions  Medication Sig Dispense Refill  . desvenlafaxine (PRISTIQ) 50 MG 24 hr tablet Take 50 mg by mouth daily.    Marland Kitchen dicyclomine (BENTYL) 20 MG tablet Take 1 tablet (20 mg total) by mouth every 6 (six) hours as needed for spasms (for abdominal cramping). 20 tablet 0  . metFORMIN (GLUCOPHAGE) 500 MG tablet Take 500 mg by mouth 2 (two) times daily with a meal.    . pravastatin (PRAVACHOL) 20 MG tablet Take 20 mg by mouth daily.     No current facility-administered medications for this visit.    Past Medical History  Diagnosis Date  . Hypertension   . Cancer     cervical 1989    Past Surgical History  Procedure Laterality Date  . Cardiac catheterization    . Cardiac electrophysiology study and ablation    . Endometrial ablation      History   Social History  . Marital Status: Married    Spouse Name: N/A  . Number of Children: N/A  . Years of Education: N/A   Occupational History  . Not on file.   Social History Main Topics  .  Smoking status: Never Smoker   . Smokeless tobacco: Never Used  . Alcohol Use: No  . Drug Use: No  . Sexual Activity: Yes    Birth Control/ Protection: None   Other Topics Concern  . Not on file   Social History Narrative     Filed Vitals:   09/08/14 1531  BP: 152/100  Pulse: 82  Height: 5\' 1"  (1.549 m)  Weight: 296 lb (134.265 kg)  SpO2: 98%    PHYSICAL EXAM General: NAD, obese Neck: No JVD, no thyromegaly or thyroid nodule.  Lungs: Clear to auscultation bilaterally with normal respiratory effort. CV: Nondisplaced PMI. Regular rate and rhythm, normal S1/S2, no S3/S4, no murmur. No peripheral edema. No carotid bruit. Normal pedal pulses.  Abdomen: Soft, morbidly obese.  Skin: Intact without lesions or rashes.  Neurologic: Alert and oriented x 3.  Psych: Normal affect. Extremities: No clubbing or cyanosis.  HEENT: Normal.   ECG: Most recent ECG reviewed.    ASSESSMENT AND PLAN: 1. Chest tightness with shortness of breath and palpitations: Nuclear stress test results were low risk with no large ischemic zones. Symptoms may be related to elevated BP. Will start lisinopril-HCTZ 10-12.5 mg daily and monitor. If BP is controlled and she were to continue to have symptoms, I would consider long-acting nitrates or ranolazine.   2. Essential HTN: Markedly elevated. Will start lisinopril-HCTZ 10-12.5 mg daily and monitor given her concomitant diabetes.  3. Hyperlipidemia: Continue pravastatin.  4. Right-sided chamber enlargement:  Likely due to morbid obesity and related hypoventilation syndrome. She will need a sleep apnea evaluation in the future as well.  Dispo: f/u 3 weeks.   Kate Sable, M.D., F.A.C.C.

## 2014-09-15 ENCOUNTER — Other Ambulatory Visit (HOSPITAL_COMMUNITY): Payer: Self-pay | Admitting: Pulmonary Disease

## 2014-09-15 DIAGNOSIS — R0602 Shortness of breath: Secondary | ICD-10-CM

## 2014-09-18 ENCOUNTER — Ambulatory Visit (HOSPITAL_COMMUNITY): Admission: RE | Admit: 2014-09-18 | Payer: BLUE CROSS/BLUE SHIELD | Source: Ambulatory Visit

## 2014-09-25 ENCOUNTER — Ambulatory Visit (HOSPITAL_COMMUNITY): Admission: RE | Admit: 2014-09-25 | Payer: BLUE CROSS/BLUE SHIELD | Source: Ambulatory Visit

## 2014-09-28 ENCOUNTER — Ambulatory Visit (HOSPITAL_COMMUNITY)
Admission: RE | Admit: 2014-09-28 | Discharge: 2014-09-28 | Disposition: A | Discharge status: Home / Self Care | Payer: BLUE CROSS/BLUE SHIELD | Source: Ambulatory Visit | Attending: Pulmonary Disease | Admitting: Pulmonary Disease

## 2014-09-28 ENCOUNTER — Ambulatory Visit (HOSPITAL_COMMUNITY): Payer: BLUE CROSS/BLUE SHIELD

## 2014-09-28 ENCOUNTER — Encounter (HOSPITAL_COMMUNITY): Payer: Self-pay

## 2014-09-28 DIAGNOSIS — R079 Chest pain, unspecified: Secondary | ICD-10-CM | POA: Insufficient documentation

## 2014-09-28 DIAGNOSIS — R0602 Shortness of breath: Secondary | ICD-10-CM | POA: Insufficient documentation

## 2014-09-28 DIAGNOSIS — K760 Fatty (change of) liver, not elsewhere classified: Secondary | ICD-10-CM | POA: Insufficient documentation

## 2014-09-28 HISTORY — DX: Type 2 diabetes mellitus without complications: E11.9

## 2014-09-28 LAB — POCT I-STAT CREATININE: CREATININE: 0.5 mg/dL (ref 0.44–1.00)

## 2014-09-28 MED ORDER — IOHEXOL 300 MG/ML  SOLN
80.0000 mL | Freq: Once | INTRAMUSCULAR | Status: AC | PRN
Start: 2014-09-28 — End: 2014-09-28
  Administered 2014-09-28: 80 mL via INTRAVENOUS

## 2014-09-30 ENCOUNTER — Telehealth: Payer: Self-pay | Admitting: Cardiovascular Disease

## 2014-09-30 NOTE — Telephone Encounter (Signed)
Patient has question about returning monitor

## 2014-10-01 ENCOUNTER — Encounter: Payer: Self-pay | Admitting: Cardiovascular Disease

## 2014-10-01 ENCOUNTER — Ambulatory Visit (INDEPENDENT_AMBULATORY_CARE_PROVIDER_SITE_OTHER): Payer: BLUE CROSS/BLUE SHIELD | Admitting: Cardiovascular Disease

## 2014-10-01 ENCOUNTER — Other Ambulatory Visit: Payer: Self-pay | Admitting: *Deleted

## 2014-10-01 VITALS — BP 134/84 | HR 80 | Ht 61.0 in | Wt 298.0 lb

## 2014-10-01 DIAGNOSIS — R5383 Other fatigue: Secondary | ICD-10-CM | POA: Diagnosis not present

## 2014-10-01 DIAGNOSIS — R079 Chest pain, unspecified: Secondary | ICD-10-CM | POA: Diagnosis not present

## 2014-10-01 DIAGNOSIS — E785 Hyperlipidemia, unspecified: Secondary | ICD-10-CM | POA: Diagnosis not present

## 2014-10-01 DIAGNOSIS — R002 Palpitations: Secondary | ICD-10-CM | POA: Diagnosis not present

## 2014-10-01 DIAGNOSIS — E119 Type 2 diabetes mellitus without complications: Secondary | ICD-10-CM

## 2014-10-01 DIAGNOSIS — R0609 Other forms of dyspnea: Secondary | ICD-10-CM

## 2014-10-01 DIAGNOSIS — I10 Essential (primary) hypertension: Secondary | ICD-10-CM

## 2014-10-01 DIAGNOSIS — M7989 Other specified soft tissue disorders: Secondary | ICD-10-CM

## 2014-10-01 DIAGNOSIS — I517 Cardiomegaly: Secondary | ICD-10-CM

## 2014-10-01 MED ORDER — ISOSORBIDE MONONITRATE ER 30 MG PO TB24: 30.0000 mg | ORAL_TABLET | Freq: Every day | ORAL | Status: DC

## 2014-10-01 MED ORDER — POTASSIUM CHLORIDE CRYS ER 20 MEQ PO TBCR: 20.0000 meq | EXTENDED_RELEASE_TABLET | ORAL | Status: DC | PRN

## 2014-10-01 MED ORDER — FUROSEMIDE 20 MG PO TABS: 20.0000 mg | ORAL_TABLET | ORAL | Status: DC | PRN

## 2014-10-01 NOTE — Addendum Note (Signed)
Addended by: Laurine Blazer on: 10/01/2014 03:01 PM   Modules accepted: Orders, Level of Service

## 2014-10-01 NOTE — Progress Notes (Signed)
Patient ID: Christina Cameron, female   DOB: April 15, 1966, 49 y.o.   MRN: [id number]      SUBJECTIVE: The patient returns for follow-up of chest tightness, hypertension, and swelling of the hands and feet. Her chest tightness has decreased to some degree but she feels markedly fatigued. She had a chest CT done recently which did not show any evidence of edema or consolidation. She continues to have hands and feet swelling intermittently. She has diabetes mellitus. Last TSH I find is from 2013. She has not had a recent glycosylated hemoglobin.   Review of Systems: As per "subjective", otherwise negative.  No Known Allergies  Current Outpatient Prescriptions  Medication Sig Dispense Refill  . desvenlafaxine (PRISTIQ) 50 MG 24 hr tablet Take 50 mg by mouth daily.    Marland Kitchen dicyclomine (BENTYL) 20 MG tablet Take 1 tablet (20 mg total) by mouth every 6 (six) hours as needed for spasms (for abdominal cramping). 20 tablet 0  . lisinopril-hydrochlorothiazide (ZESTORETIC) 10-12.5 MG per tablet Take 1 tablet by mouth daily. 30 tablet 6  . metFORMIN (GLUCOPHAGE) 500 MG tablet Take 500 mg by mouth 2 (two) times daily with a meal.    . pravastatin (PRAVACHOL) 20 MG tablet Take 20 mg by mouth daily.     No current facility-administered medications for this visit.    Past Medical History  Diagnosis Date  . Hypertension   . Cancer     cervical 1989  . Diabetes mellitus without complication     Past Surgical History  Procedure Laterality Date  . Cardiac catheterization    . Cardiac electrophysiology study and ablation    . Endometrial ablation      History   Social History  . Marital Status: Married    Spouse Name: N/A  . Number of Children: N/A  . Years of Education: N/A   Occupational History  . Not on file.   Social History Main Topics  . Smoking status: Never Smoker   . Smokeless tobacco: Never Used  . Alcohol Use: No  . Drug Use: No  . Sexual Activity: Yes    Birth Control/ Protection:  None   Other Topics Concern  . Not on file   Social History Narrative     Filed Vitals:   10/01/14 1423  BP: 134/84  Pulse: 80  Height: 5\' 1"  (1.549 m)  Weight: 298 lb (135.172 kg)  SpO2: 97%    PHYSICAL EXAM General: NAD, obese Neck: No JVD, no thyromegaly or thyroid nodule.  Lungs: Clear to auscultation bilaterally with normal respiratory effort. CV: Nondisplaced PMI. Regular rate and rhythm, normal S1/S2, no S3/S4, no murmur. No peripheral edema. No carotid bruit. Normal pedal pulses.  Abdomen: Soft, morbidly obese.  Skin: Intact without lesions or rashes.  Neurologic: Alert and oriented x 3.  Psych: Normal affect. Extremities: No clubbing or cyanosis.  HEENT: Normal.   ECG: Most recent ECG reviewed.      ASSESSMENT AND PLAN: 1. Chest tightness with shortness of breath and palpitations: Nuclear stress test results were low risk with no large ischemic zones. Symptoms may have been related to elevated BP. I will try Imdur 30 mg daily. If medical therapy fails, I would not rule out the possibility of coronary angiography.  2. Essential HTN: Now well controlled on lisinopril-HCTZ 10-12.5 mg daily. No changes.  3. Hyperlipidemia: Continue pravastatin.  4. Marked fatigue with right-sided chamber enlargement: Likely due to morbid obesity and related hypoventilation syndrome. She will need a sleep  apnea evaluation which I will order. Will also check TSH and HbA1C given obesity and diabetes mellitus.  5. Fatigue: See #4.  6. Hand and feet swelling: Will prescribe Lasix 20 mg prn with KCl 20 meq.  Dispo: f/u 2 months.  Time spent: 40 minutes, of which greater than 50% was spent reviewing symptoms, relevant blood tests and studies, and discussing management plan with the patient.   Kate Sable, M.D., F.A.C.C.

## 2014-10-01 NOTE — Patient Instructions (Addendum)
Your physician has recommended that you have a sleep study. This test records several body functions during sleep, including: brain activity, eye movement, oxygen and carbon dioxide blood levels, heart rate and rhythm, breathing rate and rhythm, the flow of air through your mouth and nose, snoring, body muscle movements, and chest and belly movement. Labs for TSH, HgA1C Office will contact with results via phone or letter.   Begin Lasix 20mg  as needed for swelling of hands or legs. Begin Potassium 68meq to be taken on the days you take your as needed Lasix.   Begin Imdur 30mg  daily  New prescriptions sent to [facility] today. Continue all other current medications. Follow up in  2 months.

## 2014-10-06 NOTE — Telephone Encounter (Signed)
Patient stated that she had everything taken care of now.  Packaged up and ready to go.

## 2014-10-12 ENCOUNTER — Other Ambulatory Visit (HOSPITAL_COMMUNITY): Payer: Self-pay | Admitting: Pulmonary Disease

## 2014-10-12 DIAGNOSIS — Z1231 Encounter for screening mammogram for malignant neoplasm of breast: Secondary | ICD-10-CM

## 2014-10-22 ENCOUNTER — Ambulatory Visit: Payer: BLUE CROSS/BLUE SHIELD | Admitting: Orthopedic Surgery

## 2014-10-27 ENCOUNTER — Encounter (HOSPITAL_COMMUNITY): Payer: Self-pay | Admitting: Emergency Medicine

## 2014-10-27 ENCOUNTER — Emergency Department (HOSPITAL_COMMUNITY): Payer: BLUE CROSS/BLUE SHIELD

## 2014-10-27 ENCOUNTER — Emergency Department (HOSPITAL_COMMUNITY)
Admission: EM | Admit: 2014-10-27 | Discharge: 2014-10-27 | Disposition: A | Discharge status: Home / Self Care | Payer: BLUE CROSS/BLUE SHIELD | Attending: Emergency Medicine | Admitting: Emergency Medicine

## 2014-10-27 DIAGNOSIS — I10 Essential (primary) hypertension: Secondary | ICD-10-CM | POA: Diagnosis not present

## 2014-10-27 DIAGNOSIS — Z79899 Other long term (current) drug therapy: Secondary | ICD-10-CM | POA: Diagnosis not present

## 2014-10-27 DIAGNOSIS — R079 Chest pain, unspecified: Secondary | ICD-10-CM

## 2014-10-27 DIAGNOSIS — E119 Type 2 diabetes mellitus without complications: Secondary | ICD-10-CM | POA: Insufficient documentation

## 2014-10-27 DIAGNOSIS — Z8541 Personal history of malignant neoplasm of cervix uteri: Secondary | ICD-10-CM | POA: Insufficient documentation

## 2014-10-27 DIAGNOSIS — T464X5A Adverse effect of angiotensin-converting-enzyme inhibitors, initial encounter: Secondary | ICD-10-CM | POA: Diagnosis not present

## 2014-10-27 DIAGNOSIS — T7840XA Allergy, unspecified, initial encounter: Secondary | ICD-10-CM

## 2014-10-27 LAB — BASIC METABOLIC PANEL
Anion gap: 8 (ref 5–15)
BUN: 15 mg/dL (ref 6–20)
CALCIUM: 8.5 mg/dL — AB (ref 8.9–10.3)
CO2: 22 mmol/L (ref 22–32)
CREATININE: 0.57 mg/dL (ref 0.44–1.00)
Chloride: 103 mmol/L (ref 101–111)
GFR calc Af Amer: >60 mL/min (ref >60)
GFR calc non Af Amer: >60 mL/min (ref >60)
GLUCOSE: 227 mg/dL — AB (ref 65–99)
Potassium: 3.7 mmol/L (ref 3.5–5.1)
Sodium: 133 mmol/L — ABNORMAL LOW (ref 135–145)

## 2014-10-27 LAB — CBC WITH DIFFERENTIAL/PLATELET
Basophils Absolute: 0.1 10*3/uL (ref 0.0–0.1)
Basophils Relative: 1 % (ref 0–1)
EOS ABS: 0.2 10*3/uL (ref 0.0–0.7)
Eosinophils Relative: 2 % (ref 0–5)
HCT: 42.2 % (ref 36.0–46.0)
HEMOGLOBIN: 14.2 g/dL (ref 12.0–15.0)
Lymphocytes Relative: 33 % (ref 12–46)
Lymphs Abs: 3.5 10*3/uL (ref 0.7–4.0)
MCH: 29.7 pg (ref 26.0–34.0)
MCHC: 33.6 g/dL (ref 30.0–36.0)
MCV: 88.3 fL (ref 78.0–100.0)
MONOS PCT: 8 % (ref 3–12)
Monocytes Absolute: 0.8 10*3/uL (ref 0.1–1.0)
NEUTROS PCT: 56 % (ref 43–77)
Neutro Abs: 5.9 10*3/uL (ref 1.7–7.7)
Platelets: 226 10*3/uL (ref 150–400)
RBC: 4.78 MIL/uL (ref 3.87–5.11)
RDW: 13.5 % (ref 11.5–15.5)
WBC: 10.5 10*3/uL (ref 4.0–10.5)

## 2014-10-27 LAB — TROPONIN I: Troponin I: <0.03 ng/mL (ref <0.031)

## 2014-10-27 LAB — CBG MONITORING, ED: Glucose-Capillary: 224 mg/dL — ABNORMAL HIGH (ref 65–99)

## 2014-10-27 MED ORDER — DIPHENHYDRAMINE HCL 50 MG/ML IJ SOLN
50.0000 mg | Freq: Once | INTRAMUSCULAR | Status: AC
  Administered 2014-10-27: 50 mg via INTRAVENOUS
  Filled 2014-10-27: qty 1

## 2014-10-27 MED ORDER — PREDNISONE 20 MG PO TABS: 60.0000 mg | ORAL_TABLET | Freq: Every day | ORAL | Status: DC

## 2014-10-27 MED ORDER — METHYLPREDNISOLONE SODIUM SUCC 125 MG IJ SOLR
125.0000 mg | Freq: Once | INTRAMUSCULAR | Status: AC
  Administered 2014-10-27: 125 mg via INTRAVENOUS
  Filled 2014-10-27: qty 2

## 2014-10-27 MED ORDER — FAMOTIDINE IN NACL 20-0.9 MG/50ML-% IV SOLN
20.0000 mg | Freq: Once | INTRAVENOUS | Status: AC
  Administered 2014-10-27: 20 mg via INTRAVENOUS
  Filled 2014-10-27: qty 50

## 2014-10-27 NOTE — ED Provider Notes (Signed)
This chart was scribed for [place], DO by Hansel Feinstein, ED Scribe. This patient was seen in room APA12/APA12 and the patient's care was started at 12:24 AM.    TIME SEEN: 12:24 AM   CHIEF COMPLAINT:  Chief Complaint  Patient presents with  . Allergic Reaction     HPI: HPI Comments: Christina Cameron is a 49 y.o. female with Hx of HTN and DM who presents to the Emergency Department complaining of constant, moderate SOB and chest tightness onset 6 hours ago.  Pt states she was recently prescribed Lisinopril HCTZ and took her first dose this morning. States later in the day she noticed that she had hives and swelling in both of her arms. States she is having tingling in her lips. She also feels like her throat is tight. She denies any other environmental or dietary changes. Pt denies a history of similar symptoms. No fever, cough. No lower extremity swelling or pain.  Her PCP is Dr. Luan Pulling. Her cardiologist is Dr. Bronson Ing.  ROS: See HPI Constitutional: no fever  Eyes: no drainage  ENT: no runny nose   Cardiovascular: chest pain  Resp: SOB  GI: no vomiting GU: no dysuria Integumentary: no rash  Allergy: hives  Musculoskeletal: no leg swelling  Neurological: no slurred speech ROS otherwise negative  PAST MEDICAL HISTORY/PAST SURGICAL HISTORY:  Past Medical History  Diagnosis Date  . Hypertension   . Cancer     cervical 1989  . Diabetes mellitus without complication     MEDICATIONS:  Prior to Admission medications   Medication Sig Start Date End Date Taking? Authorizing Provider  desvenlafaxine (PRISTIQ) 50 MG 24 hr tablet Take 50 mg by mouth daily.   Yes Historical Provider, MD  dicyclomine (BENTYL) 20 MG tablet Take 1 tablet (20 mg total) by mouth every 6 (six) hours as needed for spasms (for abdominal cramping). 04/28/14  Yes Linton Flemings, MD  furosemide (LASIX) 20 MG tablet Take 1 tablet (20 mg total) by mouth as needed for edema (legs & hands). 10/01/14  Yes Herminio Commons, MD  isosorbide mononitrate (IMDUR) 30 MG 24 hr tablet Take 1 tablet (30 mg total) by mouth daily. 10/01/14  Yes Herminio Commons, MD  lisinopril-hydrochlorothiazide (ZESTORETIC) 10-12.5 MG per tablet Take 1 tablet by mouth daily. 09/08/14  Yes Herminio Commons, MD  metFORMIN (GLUCOPHAGE) 500 MG tablet Take 500 mg by mouth 2 (two) times daily with a meal.   Yes Historical Provider, MD  pravastatin (PRAVACHOL) 20 MG tablet Take 20 mg by mouth daily.   Yes Historical Provider, MD  potassium chloride SA (KLOR-CON M20) 20 MEQ tablet Take 1 tablet (20 mEq total) by mouth as needed. (TAKE ON DAYS THAT YOU TAKE YOUR AS NEEDED LASIX) 10/01/14   Herminio Commons, MD    ALLERGIES:  No Known Allergies  SOCIAL HISTORY:  History  Substance Use Topics  . Smoking status: Never Smoker   . Smokeless tobacco: Never Used  . Alcohol Use: No    FAMILY HISTORY: Family History  Problem Relation Age of Onset  . Cancer Mother   . Cancer Father   . Stroke Brother   . Hypertension Brother     EXAM: BP 133/88 mmHg  Pulse 99  Temp(Src) 98.7 F (37.1 C)  Resp 18  Ht 5\' 1"  (1.549 m)  Wt 280 lb (127.007 kg)  BMI 52.93 kg/m2  SpO2 100% CONSTITUTIONAL: Alert and oriented and responds appropriately to questions. Well-appearing; well-nourished HEAD:  Normocephalic EYES: Conjunctivae clear, PERRL ENT: normal nose; no rhinorrhea; moist mucous membranes; pharynx without lesions noted. No angioedema. Airway is patent.. No lesions in mucous membranes. No stridor. Normal phonation. NECK: Supple, no meningismus, no LAD  CARD: RRR; S1 and S2 appreciated; no murmurs, no clicks, no rubs, no gallops RESP: Normal chest excursion without splinting or tachypnea; breath sounds clear and equal bilaterally; no wheezes, no rhonchi, no rales, no hypoxia or respiratory distress, speaking full sentences ABD/GI: Normal bowel sounds; non-distended; soft, non-tender, no rebound, no guarding, no peritoneal  signs BACK:  The back appears normal and is non-tender to palpation, there is no CVA tenderness EXT: Normal ROM in all joints; non-tender to palpation; no edema; normal capillary refill; no cyanosis, no calf tenderness or swelling    SKIN: Normal color for age and race; warm, patient has diffuse hives, no other rash, no lesions on her palms or soles, no blisters or desquamation, no petechiae or purpura NEURO: Moves all extremities equally, sensation to light touch intact diffusely, cranial nerves II through XII intact PSYCH: The patient's mood and manner are appropriate. Grooming and personal hygiene are appropriate.  MEDICAL DECISION MAKING: Patient here with allergic reaction possibly to her lisinopril/hydrochlorothiazide that she started taking this morning. No other new exposures. She has hives on exam but no angioedema, wheezing, hypoxia, hypotension. We'll give Solu-Medrol, Benadryl and Pepcid and monitor. She does also have her have risk factors for ACS and describes chest pain and shortness of breath. Symptoms have been ongoing for over 6 hours and have been constant. EKG shows no new ischemic changes. We'll obtain cardiac labs, chest x-ray.  ED PROGRESS: Pt's labs are unremarkable. Troponin negative. Chest x-ray clear. Patient reports feeling much better after above medications. Will discharge home with steroid burst and have her use Benadryl as needed. Have advised her to stop her lisinopril/hydrochlorothiazide and call her cardiologist tomorrow to discuss recommendations for blood pressure control. Her systolic blood pressure here in the ED is in the 100s to 035D with diastolic blood pressure in the 60s to 70s. Discussed return precautions. She verbalized understanding and is comfortable with this plan.     EKG Interpretation  Date/Time:  Tuesday Oct 27 2014 00:41:36 EDT Ventricular Rate:  98 PR Interval:  161 QRS Duration: 89 QT Interval:  362 QTC Calculation: 462 R Axis:   -66 Text  Interpretation:  Sinus rhythm Left anterior fascicular block Low voltage, precordial leads Consider anterior infarct No significant change since last tracing Confirmed by Othniel Maret,  DO, Dereona Kolodny ([id number]) on 10/27/2014 12:45:58 AM      I personally performed the services described in this documentation, which was scribed in my presence. The recorded information has been reviewed and is accurate.   [place], DO 10/27/14 [phone]

## 2014-10-27 NOTE — Discharge Instructions (Signed)
Please stop taking your lisinopril/hydrochlorothiazide. Please call your cardiologist tomorrow to discuss with them starting a new blood pressure medication. You may use Benadryl 50 mg every 8 hours as needed for hives, itching. We're also sending you home on another 4 days of steroids.  You will need to monitor your blood sugar closely when taking this medication.   Allergies Allergies may happen from anything your body is sensitive to. This may be food, medicines, pollens, chemicals, and nearly anything around you in everyday life that produces allergens. An allergen is anything that causes an allergy producing substance. Heredity is often a factor in causing these problems. This means you may have some of the same allergies as your parents. Food allergies happen in all age groups. Food allergies are some of the most severe and life threatening. Some common food allergies are cow's milk, seafood, eggs, nuts, wheat, and soybeans. SYMPTOMS   Swelling around the mouth.  An itchy red rash or hives.  Vomiting or diarrhea.  Difficulty breathing. SEVERE ALLERGIC REACTIONS ARE LIFE-THREATENING. This reaction is called anaphylaxis. It can cause the mouth and throat to swell and cause difficulty with breathing and swallowing. In severe reactions only a trace amount of food (for example, peanut oil in a salad) may cause death within seconds. Seasonal allergies occur in all age groups. These are seasonal because they usually occur during the same season every year. They may be a reaction to molds, grass pollens, or tree pollens. Other causes of problems are house dust mite allergens, pet dander, and mold spores. The symptoms often consist of nasal congestion, a runny itchy nose associated with sneezing, and tearing itchy eyes. There is often an associated itching of the mouth and ears. The problems happen when you come in contact with pollens and other allergens. Allergens are the particles in the air that  the body reacts to with an allergic reaction. This causes you to release allergic antibodies. Through a chain of events, these eventually cause you to release histamine into the blood stream. Although it is meant to be protective to the body, it is this release that causes your discomfort. This is why you were given anti-histamines to feel better. If you are unable to pinpoint the offending allergen, it may be determined by skin or blood testing. Allergies cannot be cured but can be controlled with medicine. Hay fever is a collection of all or some of the seasonal allergy problems. It may often be treated with simple over-the-counter medicine such as diphenhydramine. Take medicine as directed. Do not drink alcohol or drive while taking this medicine. Check with your caregiver or package insert for child dosages. If these medicines are not effective, there are many new medicines your caregiver can prescribe. Stronger medicine such as nasal spray, eye drops, and corticosteroids may be used if the first things you try do not work well. Other treatments such as immunotherapy or desensitizing injections can be used if all else fails. Follow up with your caregiver if problems continue. These seasonal allergies are usually not life threatening. They are generally more of a nuisance that can often be handled using medicine. HOME CARE INSTRUCTIONS   If unsure what causes a reaction, keep a diary of foods eaten and symptoms that follow. Avoid foods that cause reactions.  If hives or rash are present:  Take medicine as directed.  You may use an over-the-counter antihistamine (diphenhydramine) for hives and itching as needed.  Apply cold compresses (cloths) to the skin or take  baths in cool water. Avoid hot baths or showers. Heat will make a rash and itching worse.  If you are severely allergic:  Following a treatment for a severe reaction, hospitalization is often required for closer follow-up.  Wear a  medic-alert bracelet or necklace stating the allergy.  You and your family must learn how to give adrenaline or use an anaphylaxis kit.  If you have had a severe reaction, always carry your anaphylaxis kit or EpiPen with you. Use this medicine as directed by your caregiver if a severe reaction is occurring. Failure to do so could have a fatal outcome. SEEK MEDICAL CARE IF:  You suspect a food allergy. Symptoms generally happen within 30 minutes of eating a food.  Your symptoms have not gone away within 2 days or are getting worse.  You develop new symptoms.  You want to retest yourself or your child with a food or drink you think causes an allergic reaction. Never do this if an anaphylactic reaction to that food or drink has happened before. Only do this under the care of a caregiver. SEEK IMMEDIATE MEDICAL CARE IF:   You have difficulty breathing, are wheezing, or have a tight feeling in your chest or throat.  You have a swollen mouth, or you have hives, swelling, or itching all over your body.  You have had a severe reaction that has responded to your anaphylaxis kit or an EpiPen. These reactions may return when the medicine has worn off. These reactions should be considered life threatening. MAKE SURE YOU:   Understand these instructions.  Will watch your condition.  Will get help right away if you are not doing well or get worse. Document Released: 08/08/2002 Document Revised: 09/09/2012 Document Reviewed: 01/13/2008 [facility] Patient Information 2015 [place], [place]. This information is not intended to replace advice given to you by your health care provider. Make sure you discuss any questions you have with your health care provider.    Chest Pain (Nonspecific) It is often hard to give a specific diagnosis for the cause of chest pain. There is always a chance that your pain could be related to something serious, such as a heart attack or a blood clot in the lungs. You need to  follow up with your health care provider for further evaluation. CAUSES   Heartburn.  Pneumonia or bronchitis.  Anxiety or stress.  Inflammation around your heart (pericarditis) or lung (pleuritis or pleurisy).  A blood clot in the lung.  A collapsed lung (pneumothorax). It can develop suddenly on its own (spontaneous pneumothorax) or from trauma to the chest.  Shingles infection (herpes zoster virus). The chest wall is composed of bones, muscles, and cartilage. Any of these can be the source of the pain.  The bones can be bruised by injury.  The muscles or cartilage can be strained by coughing or overwork.  The cartilage can be affected by inflammation and become sore (costochondritis). DIAGNOSIS  Lab tests or other studies may be needed to find the cause of your pain. Your health care provider may have you take a test called an ambulatory electrocardiogram (ECG). An ECG records your heartbeat patterns over a 24-hour period. You may also have other tests, such as:  Transthoracic echocardiogram (TTE). During echocardiography, sound waves are used to evaluate how blood flows through your heart.  Transesophageal echocardiogram (TEE).  Cardiac monitoring. This allows your health care provider to monitor your heart rate and rhythm in real time.  Holter monitor. This is a  portable device that records your heartbeat and can help diagnose heart arrhythmias. It allows your health care provider to track your heart activity for several days, if needed.  Stress tests by exercise or by giving medicine that makes the heart beat faster. TREATMENT   Treatment depends on what may be causing your chest pain. Treatment may include:  Acid blockers for heartburn.  Anti-inflammatory medicine.  Pain medicine for inflammatory conditions.  Antibiotics if an infection is present.  You may be advised to change lifestyle habits. This includes stopping smoking and avoiding alcohol, caffeine, and  chocolate.  You may be advised to keep your head raised (elevated) when sleeping. This reduces the chance of acid going backward from your stomach into your esophagus. Most of the time, nonspecific chest pain will improve within 2-3 days with rest and mild pain medicine.  HOME CARE INSTRUCTIONS   If antibiotics were prescribed, take them as directed. Finish them even if you start to feel better.  For the next few days, avoid physical activities that bring on chest pain. Continue physical activities as directed.  Do not use any tobacco products, including cigarettes, chewing tobacco, or electronic cigarettes.  Avoid drinking alcohol.  Only take medicine as directed by your health care provider.  Follow your health care provider's suggestions for further testing if your chest pain does not go away.  Keep any follow-up appointments you made. If you do not go to an appointment, you could develop lasting (chronic) problems with pain. If there is any problem keeping an appointment, call to reschedule. SEEK MEDICAL CARE IF:   Your chest pain does not go away, even after treatment.  You have a rash with blisters on your chest.  You have a fever. SEEK IMMEDIATE MEDICAL CARE IF:   You have increased chest pain or pain that spreads to your arm, neck, jaw, back, or abdomen.  You have shortness of breath.  You have an increasing cough, or you cough up blood.  You have severe back or abdominal pain.  You feel nauseous or vomit.  You have severe weakness.  You faint.  You have chills. This is an emergency. Do not wait to see if the pain will go away. Get medical help at once. Call your local emergency services (911 in [place] Do not drive yourself to the hospital. MAKE SURE YOU:   Understand these instructions.  Will watch your condition.  Will get help right away if you are not doing well or get worse. Document Released: 02/22/2005 Document Revised: 05/20/2013 Document Reviewed:  12/19/2007 [facility] Patient Information 2015 [place], [place]. This information is not intended to replace advice given to you by your health care provider. Make sure you discuss any questions you have with your health care provider.

## 2014-10-27 NOTE — ED Notes (Signed)
Pt just started on new bp meds and took it for the first time yesterday. Pt c/o sob, redness and weakness.

## 2014-10-28 ENCOUNTER — Telehealth: Payer: Self-pay | Admitting: Cardiology

## 2014-10-28 NOTE — Telephone Encounter (Signed)
Christina Cameron states that she thinks she had an allergic reaction to the Lasix with [employer] on 10/27/14. Went to the ER at [facility]. She is wanting to know what to do about this medication?  Please see ER notes

## 2014-10-28 NOTE — Telephone Encounter (Signed)
Per provider ED notes, pt was instructed to hold LISINOPRIL/HCTZ until review by Dr. Bronson Ing. Will forward to provider and told pt to follow ED instructions until we called her back.

## 2014-10-29 ENCOUNTER — Ambulatory Visit (HOSPITAL_COMMUNITY): Payer: BLUE CROSS/BLUE SHIELD

## 2014-10-29 MED ORDER — HYDROCHLOROTHIAZIDE 12.5 MG PO CAPS: ORAL_CAPSULE | ORAL | Status: DC

## 2014-10-29 MED ORDER — LISINOPRIL 10 MG PO TABS: 10.0000 mg | ORAL_TABLET | Freq: Every day | ORAL | Status: DC

## 2014-10-29 NOTE — Telephone Encounter (Signed)
She had already been on lisinopril-HCTZ for some time. Although ED notes document that as the culprit agent, I wonder if it was actually Lasix. Please verify.

## 2014-10-29 NOTE — Telephone Encounter (Signed)
Pt says that ED told her to hold Lisinopril/HCTZ but pt thought this could be related to lasix. Pt was not sure what was causing reaction but it is better and wanted to know if she could start taking med again since she is still having some swelling.

## 2014-10-29 NOTE — Telephone Encounter (Signed)
Pt voiced understanding of medication changes. Lisinopril and HCTZ sent to pharmacy. Pt will f/u in July

## 2014-10-29 NOTE — Telephone Encounter (Signed)
Take HCTZ and lisinopril. Do not take Lasix. If swelling persists, can take an extra 12.5 mg HCTZ.

## 2014-11-12 ENCOUNTER — Ambulatory Visit: Payer: BLUE CROSS/BLUE SHIELD | Admitting: Orthopedic Surgery

## 2014-11-16 ENCOUNTER — Ambulatory Visit (HOSPITAL_COMMUNITY): Payer: BLUE CROSS/BLUE SHIELD

## 2014-11-20 ENCOUNTER — Other Ambulatory Visit (HOSPITAL_COMMUNITY)
Admission: RE | Admit: 2014-11-20 | Discharge: 2014-11-20 | Disposition: A | Discharge status: Home / Self Care | Payer: BLUE CROSS/BLUE SHIELD | Source: Ambulatory Visit | Attending: Cardiovascular Disease | Admitting: Cardiovascular Disease

## 2014-11-20 DIAGNOSIS — R5383 Other fatigue: Secondary | ICD-10-CM | POA: Diagnosis not present

## 2014-11-20 DIAGNOSIS — E119 Type 2 diabetes mellitus without complications: Secondary | ICD-10-CM | POA: Diagnosis present

## 2014-11-20 LAB — TSH: TSH: 2.675 u[IU]/mL (ref 0.350–4.500)

## 2014-11-21 LAB — HEMOGLOBIN A1C
Hgb A1c MFr Bld: 8.7 % — ABNORMAL HIGH (ref 4.8–5.6)
Mean Plasma Glucose: 203 mg/dL

## 2014-11-23 ENCOUNTER — Ambulatory Visit (HOSPITAL_COMMUNITY): Payer: BLUE CROSS/BLUE SHIELD

## 2014-12-10 ENCOUNTER — Encounter: Payer: Self-pay | Admitting: Cardiovascular Disease

## 2014-12-10 ENCOUNTER — Other Ambulatory Visit: Payer: Self-pay | Admitting: Cardiovascular Disease

## 2014-12-10 ENCOUNTER — Ambulatory Visit (INDEPENDENT_AMBULATORY_CARE_PROVIDER_SITE_OTHER): Payer: BLUE CROSS/BLUE SHIELD | Admitting: Cardiovascular Disease

## 2014-12-10 ENCOUNTER — Telehealth: Payer: Self-pay | Admitting: Cardiovascular Disease

## 2014-12-10 ENCOUNTER — Encounter: Payer: Self-pay | Admitting: *Deleted

## 2014-12-10 VITALS — BP 122/82 | HR 81 | Ht 61.0 in | Wt 293.0 lb

## 2014-12-10 DIAGNOSIS — R079 Chest pain, unspecified: Secondary | ICD-10-CM

## 2014-12-10 DIAGNOSIS — E785 Hyperlipidemia, unspecified: Secondary | ICD-10-CM

## 2014-12-10 DIAGNOSIS — R5383 Other fatigue: Secondary | ICD-10-CM

## 2014-12-10 DIAGNOSIS — I10 Essential (primary) hypertension: Secondary | ICD-10-CM

## 2014-12-10 DIAGNOSIS — I517 Cardiomegaly: Secondary | ICD-10-CM

## 2014-12-10 DIAGNOSIS — R0609 Other forms of dyspnea: Secondary | ICD-10-CM

## 2014-12-10 DIAGNOSIS — M7989 Other specified soft tissue disorders: Secondary | ICD-10-CM

## 2014-12-10 DIAGNOSIS — E1165 Type 2 diabetes mellitus with hyperglycemia: Secondary | ICD-10-CM

## 2014-12-10 MED ORDER — ISOSORBIDE MONONITRATE ER 60 MG PO TB24
60.0000 mg | ORAL_TABLET | Freq: Every day | ORAL | Status: DC
Start: 2014-12-10 — End: 2015-03-09

## 2014-12-10 NOTE — Telephone Encounter (Signed)
Left & right heart cath - Monday, 7/18 - 10:30 - Burt Knack

## 2014-12-10 NOTE — Patient Instructions (Signed)
   Increase Imdur to 60mg  daily - new sent to pharm today. Continue all other medications.   Your physician has requested that you have a cardiac catheterization. Cardiac catheterization is used to diagnose and/or treat various heart conditions. Doctors may recommend this procedure for a number of different reasons. The most common reason is to evaluate chest pain. Chest pain can be a symptom of coronary artery disease (CAD), and cardiac catheterization can show whether plaque is narrowing or blocking your heart's arteries. This procedure is also used to evaluate the valves, as well as measure the blood flow and oxygen levels in different parts of your heart. For further information please visit [facility]. Please follow instruction sheet, as given. Follow up to be given at time of discharge from above.

## 2014-12-10 NOTE — Telephone Encounter (Signed)
No precert required.  Pt has [place]

## 2014-12-10 NOTE — Progress Notes (Signed)
Patient ID: Christina Cameron, female   DOB: 04-14-1966, 49 y.o.   MRN: [id number]      SUBJECTIVE: The patient returns for follow-up of chest tightness, hypertension, and swelling of the hands and feet. She has diabetes and is morbidly obese.  She is tearful and anxious and complaining of recurrent chest tightness with shortness of breath. Her hands remain swollen. She describes it as "an elephant sitting on my chest".    Review of Systems: As per "subjective", otherwise negative.  No Known Allergies  Current Outpatient Prescriptions  Medication Sig Dispense Refill  . Canagliflozin-Metformin HCl [phone] MG TABS Take by mouth daily.    Marland Kitchen desvenlafaxine (PRISTIQ) 50 MG 24 hr tablet Take 50 mg by mouth daily.    Marland Kitchen dicyclomine (BENTYL) 20 MG tablet Take 1 tablet (20 mg total) by mouth every 6 (six) hours as needed for spasms (for abdominal cramping). 20 tablet 0  . hydrochlorothiazide (MICROZIDE) 12.5 MG capsule TAKE 1 TAB DAILY. TAKE EXTRA 12.5 MG DAILY AS NEEDED FOR SWELLING 90 capsule 3  . isosorbide mononitrate (IMDUR) 30 MG 24 hr tablet Take 1 tablet (30 mg total) by mouth daily. 30 tablet 6  . lisinopril (PRINIVIL,ZESTRIL) 10 MG tablet Take 1 tablet (10 mg total) by mouth daily. 90 tablet 3  . potassium chloride SA (KLOR-CON M20) 20 MEQ tablet Take 1 tablet (20 mEq total) by mouth as needed. (TAKE ON DAYS THAT YOU TAKE YOUR AS NEEDED LASIX) 30 tablet 2  . pravastatin (PRAVACHOL) 20 MG tablet Take 20 mg by mouth daily.    . predniSONE (DELTASONE) 20 MG tablet Take 3 tablets (60 mg total) by mouth daily. 12 tablet 0   No current facility-administered medications for this visit.    Past Medical History  Diagnosis Date  . Hypertension   . Cancer     cervical 1989  . Diabetes mellitus without complication     Past Surgical History  Procedure Laterality Date  . Cardiac catheterization    . Cardiac electrophysiology study and ablation    . Endometrial ablation      History    Social History  . Marital Status: Married    Spouse Name: N/A  . Number of Children: N/A  . Years of Education: N/A   Occupational History  . Not on file.   Social History Main Topics  . Smoking status: Never Smoker   . Smokeless tobacco: Never Used  . Alcohol Use: No  . Drug Use: No  . Sexual Activity: Yes    Birth Control/ Protection: None   Other Topics Concern  . Not on file   Social History Narrative     Filed Vitals:   12/10/14 0920  BP: 122/82  Pulse: 81  Height: 5\' 1"  (1.549 m)  Weight: 293 lb (132.904 kg)  SpO2: 98%    PHYSICAL EXAM General: NAD, obese Neck: No JVD, no thyromegaly or thyroid nodule.  Lungs: Clear to auscultation bilaterally with normal respiratory effort. CV: Nondisplaced PMI. Regular rate and rhythm, normal S1/S2, no S3/S4, no murmur. No peripheral edema.  Abdomen: Soft, morbidly obese.  Skin: Intact without lesions or rashes.  Neurologic: Alert and oriented x 3.  Psych: Tearful, anxious. Extremities: No clubbing or cyanosis.  HEENT: Normal.   ECG: Most recent ECG reviewed.      ASSESSMENT AND PLAN: 1. Chest tightness with shortness of breath and palpitations: Although nuclear stress test results were low risk with no large ischemic zones, she complains of worsening  chest pressure with shortness of breath. I will arrange for right and left heart catheterization with coronary angiography. Increase Imdur to 60 mg daily.  2. Essential HTN: Remains well controlled on lisinopril-HCTZ 10-12.5 mg daily. No changes.  3. Hyperlipidemia: Continue pravastatin.  4. Marked fatigue with right-sided chamber enlargement: Likely due to morbid obesity and related hypoventilation syndrome. Cath as per #1.  5. Type 2 diabetes: Poorly controlled with HbA1C 8.7% on 6/24. Meds adjusted by PCP.  6. Fatigue: She needs a sleep apnea evaluation which I previously ordered. TSH normal. Cath as per #1.  7. Hand and feet swelling: Allergic to  Lasix. Continue extra HCTZ 12.5 mg prn.   Dispo: f/u after cath.  Kate Sable, M.D., F.A.C.C.

## 2014-12-14 ENCOUNTER — Ambulatory Visit (HOSPITAL_COMMUNITY)
Admission: RE | Admit: 2014-12-14 | Discharge: 2014-12-14 | Disposition: A | Discharge status: Home / Self Care | Payer: BLUE CROSS/BLUE SHIELD | Source: Ambulatory Visit | Attending: Cardiovascular Disease | Admitting: Cardiovascular Disease

## 2014-12-14 ENCOUNTER — Encounter (HOSPITAL_COMMUNITY)
Admission: RE | Disposition: A | Discharge status: Home / Self Care | Payer: Self-pay | Source: Ambulatory Visit | Attending: Cardiovascular Disease

## 2014-12-14 ENCOUNTER — Encounter (HOSPITAL_COMMUNITY): Payer: Self-pay | Admitting: Cardiovascular Disease

## 2014-12-14 DIAGNOSIS — E785 Hyperlipidemia, unspecified: Secondary | ICD-10-CM | POA: Insufficient documentation

## 2014-12-14 DIAGNOSIS — R079 Chest pain, unspecified: Secondary | ICD-10-CM

## 2014-12-14 DIAGNOSIS — R0609 Other forms of dyspnea: Secondary | ICD-10-CM | POA: Insufficient documentation

## 2014-12-14 DIAGNOSIS — I209 Angina pectoris, unspecified: Secondary | ICD-10-CM | POA: Insufficient documentation

## 2014-12-14 DIAGNOSIS — R5383 Other fatigue: Secondary | ICD-10-CM

## 2014-12-14 DIAGNOSIS — M7989 Other specified soft tissue disorders: Secondary | ICD-10-CM | POA: Insufficient documentation

## 2014-12-14 DIAGNOSIS — R072 Precordial pain: Secondary | ICD-10-CM | POA: Diagnosis present

## 2014-12-14 DIAGNOSIS — E1165 Type 2 diabetes mellitus with hyperglycemia: Secondary | ICD-10-CM | POA: Insufficient documentation

## 2014-12-14 DIAGNOSIS — I200 Unstable angina: Secondary | ICD-10-CM

## 2014-12-14 DIAGNOSIS — I10 Essential (primary) hypertension: Secondary | ICD-10-CM | POA: Diagnosis not present

## 2014-12-14 HISTORY — PX: CARDIAC CATHETERIZATION: SHX172

## 2014-12-14 LAB — BASIC METABOLIC PANEL
ANION GAP: 7 (ref 5–15)
BUN: 15 mg/dL (ref 6–20)
CALCIUM: 8.7 mg/dL — AB (ref 8.9–10.3)
CO2: 24 mmol/L (ref 22–32)
Chloride: 103 mmol/L (ref 101–111)
Creatinine, Ser: 0.49 mg/dL (ref 0.44–1.00)
GFR calc Af Amer: >60 mL/min (ref >60)
GLUCOSE: 215 mg/dL — AB (ref 65–99)
POTASSIUM: 4 mmol/L (ref 3.5–5.1)
SODIUM: 134 mmol/L — AB (ref 135–145)

## 2014-12-14 LAB — GLUCOSE, CAPILLARY
Glucose-Capillary: 230 mg/dL — ABNORMAL HIGH (ref 65–99)
Glucose-Capillary: 184 mg/dL — ABNORMAL HIGH (ref 65–99)

## 2014-12-14 LAB — CBC
HCT: 40.3 % (ref 36.0–46.0)
HEMOGLOBIN: 13.9 g/dL (ref 12.0–15.0)
MCH: 30.3 pg (ref 26.0–34.0)
MCHC: 34.5 g/dL (ref 30.0–36.0)
MCV: 87.8 fL (ref 78.0–100.0)
PLATELETS: 212 10*3/uL (ref 150–400)
RBC: 4.59 MIL/uL (ref 3.87–5.11)
RDW: 13.6 % (ref 11.5–15.5)
WBC: 7.5 10*3/uL (ref 4.0–10.5)

## 2014-12-14 LAB — PROTIME-INR
INR: 1.08 (ref 0.00–1.49)
PROTHROMBIN TIME: 14.2 s (ref 11.6–15.2)

## 2014-12-14 SURGERY — RIGHT/LEFT HEART CATH AND CORONARY ANGIOGRAPHY
Anesthesia: LOCAL

## 2014-12-14 MED ORDER — SODIUM CHLORIDE 0.9 % IJ SOLN
INTRAMUSCULAR | Status: DC | PRN
  Administered 2014-12-14: 11:00:00 via INTRA_ARTERIAL

## 2014-12-14 MED ORDER — SODIUM CHLORIDE 0.9 % WEIGHT BASED INFUSION
3.0000 mL/kg/h | INTRAVENOUS | Status: DC
Start: 2014-12-15 — End: 2014-12-14
  Administered 2014-12-14: 3 mL/kg/h via INTRAVENOUS

## 2014-12-14 MED ORDER — SODIUM CHLORIDE 0.9 % IJ SOLN: 3.0000 mL | Freq: Two times a day (BID) | INTRAMUSCULAR | Status: DC

## 2014-12-14 MED ORDER — ACETAMINOPHEN 325 MG PO TABS: 650.0000 mg | ORAL_TABLET | ORAL | Status: DC | PRN

## 2014-12-14 MED ORDER — LIDOCAINE HCL (PF) 1 % IJ SOLN
INTRAMUSCULAR | Status: AC
  Filled 2014-12-14: qty 30

## 2014-12-14 MED ORDER — HEPARIN SODIUM (PORCINE) 1000 UNIT/ML IJ SOLN
INTRAMUSCULAR | Status: AC
  Filled 2014-12-14: qty 1

## 2014-12-14 MED ORDER — SODIUM CHLORIDE 0.9 % IV SOLN: INTRAVENOUS | Status: DC

## 2014-12-14 MED ORDER — MIDAZOLAM HCL 2 MG/2ML IJ SOLN
INTRAMUSCULAR | Status: DC | PRN
  Administered 2014-12-14: 1 mg via INTRAVENOUS
  Administered 2014-12-14: 2 mg via INTRAVENOUS

## 2014-12-14 MED ORDER — MIDAZOLAM HCL 2 MG/2ML IJ SOLN
INTRAMUSCULAR | Status: AC
  Filled 2014-12-14: qty 2

## 2014-12-14 MED ORDER — FENTANYL CITRATE (PF) 100 MCG/2ML IJ SOLN
INTRAMUSCULAR | Status: AC
  Filled 2014-12-14: qty 2

## 2014-12-14 MED ORDER — SODIUM CHLORIDE 0.9 % IJ SOLN: 3.0000 mL | INTRAMUSCULAR | Status: DC | PRN

## 2014-12-14 MED ORDER — HEPARIN SODIUM (PORCINE) 1000 UNIT/ML IJ SOLN
INTRAMUSCULAR | Status: DC | PRN
  Administered 2014-12-14: 5000 [IU] via INTRAVENOUS

## 2014-12-14 MED ORDER — ONDANSETRON HCL 4 MG/2ML IJ SOLN: 4.0000 mg | Freq: Four times a day (QID) | INTRAMUSCULAR | Status: DC | PRN

## 2014-12-14 MED ORDER — SODIUM CHLORIDE 0.9 % IV SOLN: 250.0000 mL | INTRAVENOUS | Status: DC | PRN

## 2014-12-14 MED ORDER — ASPIRIN 81 MG PO CHEW
81.0000 mg | CHEWABLE_TABLET | ORAL | Status: AC
  Administered 2014-12-14: 81 mg via ORAL

## 2014-12-14 MED ORDER — IOHEXOL 300 MG/ML  SOLN
INTRAMUSCULAR | Status: DC | PRN
  Administered 2014-12-14: 80 mL via INTRA_ARTERIAL

## 2014-12-14 MED ORDER — FENTANYL CITRATE (PF) 100 MCG/2ML IJ SOLN
INTRAMUSCULAR | Status: DC | PRN
  Administered 2014-12-14 (×2): 25 ug via INTRAVENOUS

## 2014-12-14 MED ORDER — ASPIRIN 81 MG PO CHEW
CHEWABLE_TABLET | ORAL | Status: AC
  Administered 2014-12-14: 81 mg via ORAL
  Filled 2014-12-14: qty 1

## 2014-12-14 MED ORDER — LIDOCAINE HCL (PF) 1 % IJ SOLN
INTRAMUSCULAR | Status: DC | PRN
  Administered 2014-12-14: 5 mL via INTRADERMAL

## 2014-12-14 MED ORDER — HEPARIN (PORCINE) IN NACL 2-0.9 UNIT/ML-% IJ SOLN
INTRAMUSCULAR | Status: AC
  Filled 2014-12-14: qty 1000

## 2014-12-14 MED ORDER — SODIUM CHLORIDE 0.9 % WEIGHT BASED INFUSION
1.0000 mL/kg/h | INTRAVENOUS | Status: DC
  Administered 2014-12-14: 1 mL/kg/h via INTRAVENOUS

## 2014-12-14 MED ORDER — VERAPAMIL HCL 2.5 MG/ML IV SOLN
INTRAVENOUS | Status: AC
  Filled 2014-12-14: qty 2

## 2014-12-14 MED ORDER — NITROGLYCERIN 1 MG/10 ML FOR IR/CATH LAB
INTRA_ARTERIAL | Status: DC | PRN
  Administered 2014-12-14: 11:00:00

## 2014-12-14 MED ORDER — NITROGLYCERIN 1 MG/10 ML FOR IR/CATH LAB
INTRA_ARTERIAL | Status: AC
  Filled 2014-12-14: qty 10

## 2014-12-14 SURGICAL SUPPLY — 15 items
CATH BALLN WEDGE 5F 110CM (CATHETERS) ×1 IMPLANT
CATH INFINITI 5 FR JL3.5 (CATHETERS) ×2 IMPLANT
CATH INFINITI 5FR ANG PIGTAIL (CATHETERS) ×2 IMPLANT
CATH INFINITI JR4 5F (CATHETERS) ×2 IMPLANT
CATH VISTA GUIDE 6FR JR4 (CATHETERS) IMPLANT
DEVICE RAD COMP TR BAND LRG (VASCULAR PRODUCTS) ×2 IMPLANT
GLIDESHEATH SLEND SS 6F .021 (SHEATH) ×2 IMPLANT
KIT HEART LEFT (KITS) ×2 IMPLANT
KIT HEART RIGHT NAMIC (KITS) ×2 IMPLANT
PACK CARDIAC CATHETERIZATION (CUSTOM PROCEDURE TRAY) ×2 IMPLANT
SHEATH FAST CATH BRACH 5F 5CM (SHEATH) ×1 IMPLANT
SYR MEDRAD MARK V 150ML (SYRINGE) ×2 IMPLANT
TRANSDUCER W/STOPCOCK (MISCELLANEOUS) ×4 IMPLANT
TUBING CIL FLEX 10 FLL-RA (TUBING) ×2 IMPLANT
WIRE SAFE-T 1.5MM-J .035X260CM (WIRE) ×2 IMPLANT

## 2014-12-14 NOTE — H&P (View-Only) (Signed)
Patient ID: PAISLEIGH MARONEY, female   DOB: 12/20/1965, 49 y.o.   MRN: [id number]      SUBJECTIVE: The patient returns for follow-up of chest tightness, hypertension, and swelling of the hands and feet. She has diabetes and is morbidly obese.  She is tearful and anxious and complaining of recurrent chest tightness with shortness of breath. Her hands remain swollen. She describes it as "an elephant sitting on my chest".    Review of Systems: As per "subjective", otherwise negative.  No Known Allergies  Current Outpatient Prescriptions  Medication Sig Dispense Refill  . Canagliflozin-Metformin HCl [phone] MG TABS Take by mouth daily.    Marland Kitchen desvenlafaxine (PRISTIQ) 50 MG 24 hr tablet Take 50 mg by mouth daily.    Marland Kitchen dicyclomine (BENTYL) 20 MG tablet Take 1 tablet (20 mg total) by mouth every 6 (six) hours as needed for spasms (for abdominal cramping). 20 tablet 0  . hydrochlorothiazide (MICROZIDE) 12.5 MG capsule TAKE 1 TAB DAILY. TAKE EXTRA 12.5 MG DAILY AS NEEDED FOR SWELLING 90 capsule 3  . isosorbide mononitrate (IMDUR) 30 MG 24 hr tablet Take 1 tablet (30 mg total) by mouth daily. 30 tablet 6  . lisinopril (PRINIVIL,ZESTRIL) 10 MG tablet Take 1 tablet (10 mg total) by mouth daily. 90 tablet 3  . potassium chloride SA (KLOR-CON M20) 20 MEQ tablet Take 1 tablet (20 mEq total) by mouth as needed. (TAKE ON DAYS THAT YOU TAKE YOUR AS NEEDED LASIX) 30 tablet 2  . pravastatin (PRAVACHOL) 20 MG tablet Take 20 mg by mouth daily.    . predniSONE (DELTASONE) 20 MG tablet Take 3 tablets (60 mg total) by mouth daily. 12 tablet 0   No current facility-administered medications for this visit.    Past Medical History  Diagnosis Date  . Hypertension   . Cancer     cervical 1989  . Diabetes mellitus without complication     Past Surgical History  Procedure Laterality Date  . Cardiac catheterization    . Cardiac electrophysiology study and ablation    . Endometrial ablation      History    Social History  . Marital Status: Married    Spouse Name: N/A  . Number of Children: N/A  . Years of Education: N/A   Occupational History  . Not on file.   Social History Main Topics  . Smoking status: Never Smoker   . Smokeless tobacco: Never Used  . Alcohol Use: No  . Drug Use: No  . Sexual Activity: Yes    Birth Control/ Protection: None   Other Topics Concern  . Not on file   Social History Narrative     Filed Vitals:   12/10/14 0920  BP: 122/82  Pulse: 81  Height: 5\' 1"  (1.549 m)  Weight: 293 lb (132.904 kg)  SpO2: 98%    PHYSICAL EXAM General: NAD, obese Neck: No JVD, no thyromegaly or thyroid nodule.  Lungs: Clear to auscultation bilaterally with normal respiratory effort. CV: Nondisplaced PMI. Regular rate and rhythm, normal S1/S2, no S3/S4, no murmur. No peripheral edema.  Abdomen: Soft, morbidly obese.  Skin: Intact without lesions or rashes.  Neurologic: Alert and oriented x 3.  Psych: Tearful, anxious. Extremities: No clubbing or cyanosis.  HEENT: Normal.   ECG: Most recent ECG reviewed.      ASSESSMENT AND PLAN: 1. Chest tightness with shortness of breath and palpitations: Although nuclear stress test results were low risk with no large ischemic zones, she complains of worsening  chest pressure with shortness of breath. I will arrange for right and left heart catheterization with coronary angiography. Increase Imdur to 60 mg daily.  2. Essential HTN: Remains well controlled on lisinopril-HCTZ 10-12.5 mg daily. No changes.  3. Hyperlipidemia: Continue pravastatin.  4. Marked fatigue with right-sided chamber enlargement: Likely due to morbid obesity and related hypoventilation syndrome. Cath as per #1.  5. Type 2 diabetes: Poorly controlled with HbA1C 8.7% on 6/24. Meds adjusted by PCP.  6. Fatigue: She needs a sleep apnea evaluation which I previously ordered. TSH normal. Cath as per #1.  7. Hand and feet swelling: Allergic to  Lasix. Continue extra HCTZ 12.5 mg prn.   Dispo: f/u after cath.  Kate Sable, M.D., F.A.C.C.

## 2014-12-14 NOTE — Interval H&P Note (Signed)
History and Physical Interval Note:  12/14/2014 10:34 AM  Christina Cameron  has presented today for surgery, with the diagnosis of angina  The various methods of treatment have been discussed with the patient and family. After consideration of risks, benefits and other options for treatment, the patient has consented to  Procedure(s): Right/Left Heart Cath and Coronary Angiography (N/A) as a surgical intervention .  The patient's history has been reviewed, patient examined, no change in status, stable for surgery.  I have reviewed the patient's chart and labs.  Questions were answered to the patient's satisfaction.     Sherren Mocha

## 2014-12-14 NOTE — Discharge Instructions (Signed)
Radial Site Care Refer to this sheet in the next few weeks. These instructions provide you with information on caring for yourself after your procedure. Your caregiver may also give you more specific instructions. Your treatment has been planned according to current medical practices, but problems sometimes occur. Call your caregiver if you have any problems or questions after your procedure. HOME CARE INSTRUCTIONS  You may shower the day after the procedure.Remove the bandage (dressing) and gently wash the site with plain soap and water.Gently pat the site dry.  Do not apply powder or lotion to the site.  Do not submerge the affected site in water for 3 to 5 days.  Inspect the site at least twice daily.  Do not flex or bend the affected arm for 24 hours.  No lifting over 5 pounds (2.3 kg) for 5 days after your procedure.  Do not drive home if you are discharged the same day of the procedure. Have someone else drive you.  You may drive 24 hours after the procedure unless otherwise instructed by your caregiver.  Do not operate machinery or power tools for 24 hours.  A responsible adult should be with you for the first 24 hours after you arrive home. What to expect:  Any bruising will usually fade within 1 to 2 weeks.  Blood that collects in the tissue (hematoma) may be painful to the touch. It should usually decrease in size and tenderness within 1 to 2 weeks. SEEK IMMEDIATE MEDICAL CARE IF:  You have unusual pain at the radial site.  You have redness, warmth, swelling, or pain at the radial site.  You have drainage (other than a small amount of blood on the dressing).  You have chills.  You have a fever or persistent symptoms for more than 72 hours.  You have a fever and your symptoms suddenly get worse.  Your arm becomes pale, cool, tingly, or numb.  You have heavy bleeding from the site. Hold pressure on the site. Document Released: 06/17/2010 Document Revised:  08/07/2011 Document Reviewed: 06/17/2010 [facility] Patient Information 2015 [place], [place]. This information is not intended to replace advice given to you by your health care provider. Make sure you discuss any questions you have with your health care provider. Radial Site Care Refer to this sheet in the next few weeks. These instructions provide you with information on caring for yourself after your procedure. Your caregiver may also give you more specific instructions. Your treatment has been planned according to current medical practices, but problems sometimes occur. Call your caregiver if you have any problems or questions after your procedure. HOME CARE INSTRUCTIONS  You may shower the day after the procedure.Remove the bandage (dressing) and gently wash the site with plain soap and water.Gently pat the site dry.  Do not apply powder or lotion to the site.  Do not submerge the affected site in water for 3 to 5 days.  Inspect the site at least twice daily.  Do not flex or bend the affected arm for 24 hours.  No lifting over 5 pounds (2.3 kg) for 5 days after your procedure.  Do not drive home if you are discharged the same day of the procedure. Have someone else drive you.  You may drive 24 hours after the procedure unless otherwise instructed by your caregiver.  Do not operate machinery or power tools for 24 hours.  A responsible adult should be with you for the first 24 hours after you arrive home. What to  expect:  Any bruising will usually fade within 1 to 2 weeks.  Blood that collects in the tissue (hematoma) may be painful to the touch. It should usually decrease in size and tenderness within 1 to 2 weeks. SEEK IMMEDIATE MEDICAL CARE IF:  You have unusual pain at the radial site.  You have redness, warmth, swelling, or pain at the radial site.  You have drainage (other than a small amount of blood on the dressing).  You have chills.  You have a fever or persistent  symptoms for more than 72 hours.  You have a fever and your symptoms suddenly get worse.  Your arm becomes pale, cool, tingly, or numb.  You have heavy bleeding from the site. Hold pressure on the site. Document Released: 06/17/2010 Document Revised: 08/07/2011 Document Reviewed: 06/17/2010 [facility] Patient Information 2015 [place], [place]. This information is not intended to replace advice given to you by your health care provider. Make sure you discuss any questions you have with your health care provider.

## 2014-12-14 NOTE — Progress Notes (Signed)
TR BAND REMOVAL  LOCATION:    right radial  DEFLATED PER PROTOCOL:    Yes.    TIME BAND OFF / DRESSING APPLIED:    1400   SITE UPON ARRIVAL:    Level 0  SITE AFTER BAND REMOVAL:    Level 0  CIRCULATION SENSATION AND MOVEMENT:    Within Normal Limits   Yes.    COMMENTS:   TRB REMOVED/ TEGADERM DSG APPLIED

## 2014-12-15 LAB — POCT I-STAT 3, ART BLOOD GAS (G3+)
Acid-base deficit: 1 mmol/L (ref 0.0–2.0)
BICARBONATE: 24.1 meq/L — AB (ref 20.0–24.0)
O2 Saturation: 95 %
PCO2 ART: 41.5 mmHg (ref 35.0–45.0)
PO2 ART: 77 mmHg — AB (ref 80.0–100.0)
TCO2: 25 mmol/L (ref 0–100)
pH, Arterial: 7.371 (ref 7.350–7.450)

## 2014-12-15 LAB — POCT I-STAT 3, VENOUS BLOOD GAS (G3P V)
BICARBONATE: 25.7 meq/L — AB (ref 20.0–24.0)
O2 SAT: 61 %
PO2 VEN: 34 mmHg (ref 30.0–45.0)
TCO2: 27 mmol/L (ref 0–100)
pCO2, Ven: 46.3 mmHg (ref 45.0–50.0)
pH, Ven: 7.352 — ABNORMAL HIGH (ref 7.250–7.300)

## 2015-01-22 ENCOUNTER — Encounter: Payer: BLUE CROSS/BLUE SHIELD | Admitting: Cardiovascular Disease

## 2015-02-05 ENCOUNTER — Encounter (HOSPITAL_COMMUNITY): Payer: Self-pay | Admitting: Emergency Medicine

## 2015-02-05 ENCOUNTER — Emergency Department (HOSPITAL_COMMUNITY): Payer: BLUE CROSS/BLUE SHIELD

## 2015-02-05 ENCOUNTER — Emergency Department (HOSPITAL_COMMUNITY)
Admission: EM | Admit: 2015-02-05 | Discharge: 2015-02-05 | Disposition: A | Discharge status: Home / Self Care | Payer: BLUE CROSS/BLUE SHIELD | Attending: Emergency Medicine | Admitting: Emergency Medicine

## 2015-02-05 ENCOUNTER — Telehealth: Payer: Self-pay | Admitting: *Deleted

## 2015-02-05 DIAGNOSIS — I10 Essential (primary) hypertension: Secondary | ICD-10-CM | POA: Insufficient documentation

## 2015-02-05 DIAGNOSIS — R51 Headache: Secondary | ICD-10-CM | POA: Diagnosis not present

## 2015-02-05 DIAGNOSIS — Z8541 Personal history of malignant neoplasm of cervix uteri: Secondary | ICD-10-CM | POA: Diagnosis not present

## 2015-02-05 DIAGNOSIS — Z79899 Other long term (current) drug therapy: Secondary | ICD-10-CM | POA: Diagnosis not present

## 2015-02-05 DIAGNOSIS — E780 Pure hypercholesterolemia: Secondary | ICD-10-CM | POA: Insufficient documentation

## 2015-02-05 DIAGNOSIS — Z9889 Other specified postprocedural states: Secondary | ICD-10-CM | POA: Insufficient documentation

## 2015-02-05 DIAGNOSIS — R079 Chest pain, unspecified: Secondary | ICD-10-CM

## 2015-02-05 DIAGNOSIS — E119 Type 2 diabetes mellitus without complications: Secondary | ICD-10-CM | POA: Diagnosis not present

## 2015-02-05 DIAGNOSIS — R0602 Shortness of breath: Secondary | ICD-10-CM | POA: Diagnosis not present

## 2015-02-05 HISTORY — DX: Pure hypercholesterolemia, unspecified: E78.00

## 2015-02-05 LAB — COMPREHENSIVE METABOLIC PANEL
ALT: 30 U/L (ref 14–54)
ANION GAP: 8 (ref 5–15)
AST: 34 U/L (ref 15–41)
Albumin: 4.1 g/dL (ref 3.5–5.0)
Alkaline Phosphatase: 45 U/L (ref 38–126)
BILIRUBIN TOTAL: 0.8 mg/dL (ref 0.3–1.2)
BUN: 14 mg/dL (ref 6–20)
CALCIUM: 8.7 mg/dL — AB (ref 8.9–10.3)
CO2: 26 mmol/L (ref 22–32)
Chloride: 102 mmol/L (ref 101–111)
Creatinine, Ser: 0.6 mg/dL (ref 0.44–1.00)
GFR calc non Af Amer: >60 mL/min (ref >60)
GLUCOSE: 196 mg/dL — AB (ref 65–99)
Potassium: 3.6 mmol/L (ref 3.5–5.1)
Sodium: 136 mmol/L (ref 135–145)
TOTAL PROTEIN: 7.5 g/dL (ref 6.5–8.1)

## 2015-02-05 LAB — CBC WITH DIFFERENTIAL/PLATELET
Basophils Absolute: 0.1 10*3/uL (ref 0.0–0.1)
Basophils Relative: 1 % (ref 0–1)
Eosinophils Absolute: 0.2 10*3/uL (ref 0.0–0.7)
Eosinophils Relative: 2 % (ref 0–5)
HEMATOCRIT: 41.4 % (ref 36.0–46.0)
HEMOGLOBIN: 14 g/dL (ref 12.0–15.0)
LYMPHS ABS: 2.9 10*3/uL (ref 0.7–4.0)
LYMPHS PCT: 29 % (ref 12–46)
MCH: 30.2 pg (ref 26.0–34.0)
MCHC: 33.8 g/dL (ref 30.0–36.0)
MCV: 89.4 fL (ref 78.0–100.0)
MONOS PCT: 7 % (ref 3–12)
Monocytes Absolute: 0.7 10*3/uL (ref 0.1–1.0)
NEUTROS ABS: 6.3 10*3/uL (ref 1.7–7.7)
NEUTROS PCT: 61 % (ref 43–77)
Platelets: 208 10*3/uL (ref 150–400)
RBC: 4.63 MIL/uL (ref 3.87–5.11)
RDW: 13.4 % (ref 11.5–15.5)
WBC: 10.2 10*3/uL (ref 4.0–10.5)

## 2015-02-05 LAB — TROPONIN I: Troponin I: <0.03 ng/mL (ref <0.031)

## 2015-02-05 NOTE — ED Notes (Signed)
Patient given discharge instruction, verbalized understand. IV removed, band aid applied. Patient ambulatory out of the department.  

## 2015-02-05 NOTE — Telephone Encounter (Signed)
Pt says 3:30AM woke up with severe headache/Chest pain and tightness/dizziness with pain/tingling in right arm. Told pt to report to [facility] for evaluation and offered to call EMS but pt declined and says she is going now. Will f/u

## 2015-02-05 NOTE — ED Notes (Signed)
Pt wanting [facility] to see her Medical records, advise pt they will be able to see they test and labs done here in the ED

## 2015-02-05 NOTE — Discharge Instructions (Signed)
As we discussed reviewed her cardiac catheterization it was normal. Follow-up with cardiology. Return for any new or worse symptoms. Today's workup without any acute findings.

## 2015-02-05 NOTE — ED Notes (Addendum)
Patient c/o right side chest pain and tightness. Patient states woke at 3:30 this morning with headache and chest pain, went back to sleep, she woke again at 6am she started having pain and weakness in right arm and weakness in legs bilaterally with dizziness. Per patient has right side heart enlargement and has had a heart ablation. Patient does report shortness of breath. Denies any slurred speech or facial drooping. Per patient right arm "feels like rubber."

## 2015-02-05 NOTE — ED Notes (Signed)
Pt states that she woke up at 0300 with headache. Woke up again at 0600 with "jabbing CP" to left chest and pain to right arm and shoulder. She also states SOB began at 0600 but that she is often SOB. States right arm now feels like rubber and states weakness to lower extremities. Grasps are equal and strong bilaterally. NAD at this time. O2 sat of 100% on RA

## 2015-02-05 NOTE — Telephone Encounter (Signed)
Pt did report to [facility] ED for evaluation

## 2015-02-05 NOTE — ED Notes (Signed)
Pt waiting on MD to assess, calm, not changes in chest pain

## 2015-02-05 NOTE — ED Provider Notes (Signed)
CSN: [id number]     Arrival date & time 02/05/15  1257 History   First MD Initiated Contact with Patient 02/05/15 1522     Chief Complaint  Patient presents with  . Chest Pain     (Consider location/radiation/quality/duration/timing/severity/associated sxs/prior Treatment) Patient is a 49 y.o. female presenting with chest pain. The history is provided by the patient.  Chest Pain Associated symptoms: headache and shortness of breath   Associated symptoms: no abdominal pain, no back pain, no fever, no nausea and not vomiting    patient status post cardiac catheterization in July without any significant findings according to the patient. Patient states that 0 3:30 in the morning woke with a headache and the chest pain to the left of the chest often has a tightness therapy at this time had a sharpness. It radiated to the right arm and shoulder. Associated with shortness of breath the patient frequently has shortness of breath. But the also was concerned about heart palpitations.  Past Medical History  Diagnosis Date  . Hypertension   . Cancer     cervical 1989  . Diabetes mellitus without complication   . High cholesterol    Past Surgical History  Procedure Laterality Date  . Cardiac catheterization    . Cardiac electrophysiology study and ablation    . Endometrial ablation    . Cardiac catheterization N/A 12/14/2014    Procedure: Right/Left Heart Cath and Coronary Angiography;  Surgeon: Sherren Mocha, MD;  Location: [facility];  Service: Cardiovascular;  Laterality: N/A;   Family History  Problem Relation Age of Onset  . Cancer Mother   . Cancer Father   . Stroke Brother   . Hypertension Brother    Social History  Substance Use Topics  . Smoking status: Never Smoker   . Smokeless tobacco: Never Used  . Alcohol Use: Yes     Comment: seldom   OB History    Gravida Para Term Preterm AB TAB SAB Ectopic Multiple Living   3 2 2  1  1   2      Review of Systems   Constitutional: Negative for fever.  HENT: Negative for congestion.   Eyes: Negative for visual disturbance.  Respiratory: Positive for shortness of breath.   Cardiovascular: Positive for chest pain.  Gastrointestinal: Negative for nausea, vomiting and abdominal pain.  Musculoskeletal: Negative for back pain.  Skin: Negative for rash.  Neurological: Positive for headaches.  Hematological: Does not bruise/bleed easily.  Psychiatric/Behavioral: Negative for confusion.      Allergies  Review of patient's allergies indicates no known allergies.  Home Medications   Prior to Admission medications   Medication Sig Start Date End Date Taking? Authorizing Provider  Acetaminophen-Caffeine (EXCEDRIN TENSION HEADACHE PO) Take 2 tablets by mouth daily.   Yes Historical Provider, MD  Canagliflozin-Metformin HCl [phone] MG TABS Take 1 tablet by mouth daily.    Yes Historical Provider, MD  desvenlafaxine (PRISTIQ) 50 MG 24 hr tablet Take 50 mg by mouth daily.   Yes Historical Provider, MD  hydrochlorothiazide (MICROZIDE) 12.5 MG capsule TAKE 1 TAB DAILY. TAKE EXTRA 12.5 MG DAILY AS NEEDED FOR SWELLING Patient taking differently: Take 12.5 mg by mouth daily. TAKE EXTRA 12.5 MG DAILY AS NEEDED FOR SWELLING 10/29/14  Yes Herminio Commons, MD  isosorbide mononitrate (IMDUR) 60 MG 24 hr tablet Take 1 tablet (60 mg total) by mouth daily. 12/10/14  Yes Herminio Commons, MD  lisinopril (PRINIVIL,ZESTRIL) 10 MG tablet Take 1 tablet (  10 mg total) by mouth daily. 10/29/14  Yes Herminio Commons, MD  pravastatin (PRAVACHOL) 20 MG tablet Take 20 mg by mouth daily.   Yes Historical Provider, MD  potassium chloride SA (KLOR-CON M20) 20 MEQ tablet Take 1 tablet (20 mEq total) by mouth as needed. (TAKE ON DAYS THAT YOU TAKE YOUR AS NEEDED LASIX) Patient not taking: Reported on 12/10/2014 10/01/14   Herminio Commons, MD  predniSONE (DELTASONE) 20 MG tablet Take 3 tablets (60 mg total) by mouth daily. Patient  not taking: Reported on 12/10/2014 10/27/14   Kristen N Ward, DO   BP 110/60 mmHg  Pulse 80  Resp 19  SpO2 94% Physical Exam  Constitutional: She is oriented to person, place, and time. She appears well-developed and well-nourished. No distress.  HENT:  Head: Normocephalic and atraumatic.  Mouth/Throat: Oropharynx is clear and moist.  Eyes: Conjunctivae and EOM are normal. Pupils are equal, round, and reactive to light.  Neck: Normal range of motion. Neck supple.  Cardiovascular: Normal rate, regular rhythm and normal heart sounds.   No murmur heard. Pulmonary/Chest: Effort normal and breath sounds normal. No respiratory distress. She has no wheezes. She has no rales.  Abdominal: Soft. Bowel sounds are normal. There is no tenderness.  Musculoskeletal: Normal range of motion. She exhibits no edema or tenderness.  Neurological: She is alert and oriented to person, place, and time. No cranial nerve deficit. She exhibits normal muscle tone. Coordination normal.  Skin: Skin is warm. No rash noted.  Nursing note and vitals reviewed.   ED Course  Procedures (including critical care time) Labs Review Labs Reviewed  COMPREHENSIVE METABOLIC PANEL - Abnormal; Notable for the following:    Glucose, Bld 196 (*)    Calcium 8.7 (*)    All other components within normal limits  CBC WITH DIFFERENTIAL/PLATELET  TROPONIN I   Results for orders placed or performed during the hospital encounter of 02/05/15  CBC with Differential  Result Value Ref Range   WBC 10.2 4.0 - 10.5 K/uL   RBC 4.63 3.87 - 5.11 MIL/uL   Hemoglobin 14.0 12.0 - 15.0 g/dL   HCT 41.4 36.0 - 46.0 %   MCV 89.4 78.0 - 100.0 fL   MCH 30.2 26.0 - 34.0 pg   MCHC 33.8 30.0 - 36.0 g/dL   RDW 13.4 11.5 - 15.5 %   Platelets 208 150 - 400 K/uL   Neutrophils Relative % 61 43 - 77 %   Neutro Abs 6.3 1.7 - 7.7 K/uL   Lymphocytes Relative 29 12 - 46 %   Lymphs Abs 2.9 0.7 - 4.0 K/uL   Monocytes Relative 7 3 - 12 %   Monocytes  Absolute 0.7 0.1 - 1.0 K/uL   Eosinophils Relative 2 0 - 5 %   Eosinophils Absolute 0.2 0.0 - 0.7 K/uL   Basophils Relative 1 0 - 1 %   Basophils Absolute 0.1 0.0 - 0.1 K/uL  Comprehensive metabolic panel  Result Value Ref Range   Sodium 136 135 - 145 mmol/L   Potassium 3.6 3.5 - 5.1 mmol/L   Chloride 102 101 - 111 mmol/L   CO2 26 22 - 32 mmol/L   Glucose, Bld 196 (H) 65 - 99 mg/dL   BUN 14 6 - 20 mg/dL   Creatinine, Ser 0.60 0.44 - 1.00 mg/dL   Calcium 8.7 (L) 8.9 - 10.3 mg/dL   Total Protein 7.5 6.5 - 8.1 g/dL   Albumin 4.1 3.5 - 5.0 g/dL  AST 34 15 - 41 U/L   ALT 30 14 - 54 U/L   Alkaline Phosphatase 45 38 - 126 U/L   Total Bilirubin 0.8 0.3 - 1.2 mg/dL   GFR calc non Af Amer >60 >60 mL/min   GFR calc Af Amer >60 >60 mL/min   Anion gap 8 5 - 15  Troponin I  Result Value Ref Range   Troponin I <0.03 <0.031 ng/mL     Imaging Review Dg Chest Portable 1 View  02/05/2015   CLINICAL DATA:  Chest pain  EXAM: PORTABLE CHEST - 1 VIEW  COMPARISON:  10/27/2014  FINDINGS: Cardiac enlargement. Negative for heart failure. Mild elevation of the right hemidiaphragm is unchanged. Negative for pneumonia or effusion.  IMPRESSION: No active disease.   Electronically Signed   By: Franchot Gallo M.D.   On: 02/05/2015 13:31   I have personally reviewed and evaluated these images and lab results as part of my medical decision-making.   EKG Interpretation   Date/Time:  Friday February 05 2015 13:09:16 EDT Ventricular Rate:  81 PR Interval:  179 QRS Duration: 88 QT Interval:  383 QTC Calculation: 445 R Axis:   -48 Text Interpretation:  Sinus rhythm Left anterior fascicular block Low  voltage, precordial leads No significant change since last tracing  Confirmed by Pauletta Pickney  MD, Ranger Petrich ([id number]) on 02/05/2015 3:28:44 PM      MDM   Final diagnoses:  Chest pain, unspecified chest pain type    Patient with onset of chest pain at 3:30 in the morning. Patient did have a recent cardiac  catheterization which was normal. That was done in July. Patient based on that should not have any significant coronary artery disease to result in MI. EKG without any acute changes compared to previous EKG. A little bit evidence of a fascicular block. Troponin was negative. Basic labs without sniffing abnormality. Room air saturation.94% 200%. No significant shortness of breath. Clinically no concern for pulmonary embolus. No leg swelling. No significant cardiac arrhythmia. Patient followed by [facility] and will make an appointment to follow-up with them.  Cardiac cath results were reviewed by me.  Fredia Sorrow, MD 02/05/15 [id number]

## 2015-02-12 ENCOUNTER — Other Ambulatory Visit (HOSPITAL_COMMUNITY): Payer: Self-pay | Admitting: Pulmonary Disease

## 2015-02-12 DIAGNOSIS — Z1231 Encounter for screening mammogram for malignant neoplasm of breast: Secondary | ICD-10-CM

## 2015-02-18 ENCOUNTER — Ambulatory Visit (HOSPITAL_COMMUNITY): Payer: BLUE CROSS/BLUE SHIELD

## 2015-02-22 ENCOUNTER — Encounter: Payer: BLUE CROSS/BLUE SHIELD | Admitting: Cardiovascular Disease

## 2015-02-24 ENCOUNTER — Encounter (HOSPITAL_COMMUNITY): Payer: Self-pay | Admitting: *Deleted

## 2015-02-24 ENCOUNTER — Emergency Department (HOSPITAL_COMMUNITY)
Admission: EM | Admit: 2015-02-24 | Discharge: 2015-02-25 | Disposition: A | Discharge status: Home / Self Care | Payer: BLUE CROSS/BLUE SHIELD | Attending: Emergency Medicine | Admitting: Emergency Medicine

## 2015-02-24 DIAGNOSIS — H9202 Otalgia, left ear: Secondary | ICD-10-CM | POA: Diagnosis not present

## 2015-02-24 DIAGNOSIS — I10 Essential (primary) hypertension: Secondary | ICD-10-CM | POA: Insufficient documentation

## 2015-02-24 DIAGNOSIS — Z79899 Other long term (current) drug therapy: Secondary | ICD-10-CM | POA: Insufficient documentation

## 2015-02-24 DIAGNOSIS — R51 Headache: Secondary | ICD-10-CM | POA: Diagnosis not present

## 2015-02-24 DIAGNOSIS — Z9889 Other specified postprocedural states: Secondary | ICD-10-CM | POA: Diagnosis not present

## 2015-02-24 DIAGNOSIS — E119 Type 2 diabetes mellitus without complications: Secondary | ICD-10-CM | POA: Diagnosis not present

## 2015-02-24 DIAGNOSIS — R6883 Chills (without fever): Secondary | ICD-10-CM | POA: Insufficient documentation

## 2015-02-24 DIAGNOSIS — E780 Pure hypercholesterolemia: Secondary | ICD-10-CM | POA: Insufficient documentation

## 2015-02-24 DIAGNOSIS — K088 Other specified disorders of teeth and supporting structures: Secondary | ICD-10-CM | POA: Diagnosis present

## 2015-02-24 DIAGNOSIS — Z8541 Personal history of malignant neoplasm of cervix uteri: Secondary | ICD-10-CM | POA: Insufficient documentation

## 2015-02-24 DIAGNOSIS — K0381 Cracked tooth: Secondary | ICD-10-CM | POA: Diagnosis not present

## 2015-02-24 DIAGNOSIS — R112 Nausea with vomiting, unspecified: Secondary | ICD-10-CM

## 2015-02-24 DIAGNOSIS — E669 Obesity, unspecified: Secondary | ICD-10-CM | POA: Diagnosis not present

## 2015-02-24 DIAGNOSIS — K029 Dental caries, unspecified: Secondary | ICD-10-CM | POA: Diagnosis not present

## 2015-02-24 HISTORY — DX: Cardiomegaly: I51.7

## 2015-02-24 LAB — CBG MONITORING, ED: Glucose-Capillary: 164 mg/dL — ABNORMAL HIGH (ref 65–99)

## 2015-02-24 MED ORDER — CLINDAMYCIN PHOSPHATE 900 MG/50ML IV SOLN
900.0000 mg | Freq: Once | INTRAVENOUS | Status: AC
  Administered 2015-02-24: 900 mg via INTRAVENOUS
  Filled 2015-02-24: qty 50

## 2015-02-24 MED ORDER — ONDANSETRON HCL 4 MG/2ML IJ SOLN
4.0000 mg | Freq: Once | INTRAMUSCULAR | Status: AC
  Administered 2015-02-24: 4 mg via INTRAVENOUS
  Filled 2015-02-24: qty 2

## 2015-02-24 MED ORDER — KETOROLAC TROMETHAMINE 30 MG/ML IJ SOLN
30.0000 mg | Freq: Once | INTRAMUSCULAR | Status: AC
  Administered 2015-02-24: 30 mg via INTRAVENOUS
  Filled 2015-02-24: qty 1

## 2015-02-24 MED ORDER — SODIUM CHLORIDE 0.9 % IV BOLUS (SEPSIS)
1000.0000 mL | Freq: Once | INTRAVENOUS | Status: AC
  Administered 2015-02-24: 1000 mL via INTRAVENOUS

## 2015-02-24 NOTE — ED Provider Notes (Signed)
CSN: [id number]     Arrival date & time 02/24/15  2222 History  By signing my name below, I, Helane Gunther, attest that this documentation has been prepared under the direction and in the presence of Rolland Porter, MD. Electronically Signed: Helane Gunther, ED Scribe. 02/24/2015. 11:10 PM.    Chief Complaint  Patient presents with  . Dental Pain   The history is provided by the patient. No language interpreter was used.   HPI Comments: Christina Cameron is a 49 y.o. female with a PMHx of DM who presents to the Emergency Department complaining of constant, worsening, lower left-sided dental pain onset yesterday. She reports associated left lower jaw pain, HA, nausea, vomiting (2x onset 8 PM tonight), chills, and L ear pain. She states she has 2 "broken off" teeth over 2 months ago on the lower left side after she was diagnosed with DM in January, for which she is now taking diabetic pills. She states she has tried rinsing her mouth with warm salt water after the teeth broke. She has tried taking tylenol, and ibuprofen of which the last dose was 3 pills at 8:30 PM today without relief of pain. She notes she does not currently have a dentist, but has spoken to [facility]. Pt denies smoking and says she drinks occasionally and very little. She has not checked her blood glucose today, but states she usually runs in the 200's. She also notes a PMHx of HTN and an enlarged right side of the heart. Pt denies fever.  PCP Dr Luan Pulling  Past Medical History  Diagnosis Date  . Hypertension   . Cancer     cervical 1989  . Diabetes mellitus without complication   . High cholesterol   . Enlarged heart    Past Surgical History  Procedure Laterality Date  . Cardiac catheterization    . Cardiac electrophysiology study and ablation    . Endometrial ablation    . Cardiac catheterization N/A 12/14/2014    Procedure: Right/Left Heart Cath and Coronary Angiography;  Surgeon: Sherren Mocha, MD;   Location: [facility];  Service: Cardiovascular;  Laterality: N/A;   Family History  Problem Relation Age of Onset  . Cancer Mother   . Cancer Father   . Stroke Brother   . Hypertension Brother    Social History  Substance Use Topics  . Smoking status: Never Smoker   . Smokeless tobacco: Never Used  . Alcohol Use: Yes     Comment: seldom   employed   OB History    Gravida Para Term Preterm AB TAB SAB Ectopic Multiple Living   3 2 2  1  1   2      Review of Systems  Constitutional: Positive for chills. Negative for fever.  HENT: Positive for dental problem and ear pain.   Gastrointestinal: Positive for nausea and vomiting.  Neurological: Positive for headaches.  All other systems reviewed and are negative.   Allergies  Review of patient's allergies indicates no known allergies.  Home Medications   Prior to Admission medications   Medication Sig Start Date End Date Taking? Authorizing Provider  Acetaminophen-Caffeine (EXCEDRIN TENSION HEADACHE PO) Take 2 tablets by mouth daily.    Historical Provider, MD  Canagliflozin-Metformin HCl [id number] MG TABS Take 1 tablet by mouth daily.     Historical Provider, MD  desvenlafaxine (PRISTIQ) 50 MG 24 hr tablet Take 50 mg by mouth daily.    Historical Provider, MD  hydrochlorothiazide (MICROZIDE) 12.5  MG capsule TAKE 1 TAB DAILY. TAKE EXTRA 12.5 MG DAILY AS NEEDED FOR SWELLING Patient taking differently: Take 12.5 mg by mouth daily. TAKE EXTRA 12.5 MG DAILY AS NEEDED FOR SWELLING 10/29/14   Herminio Commons, MD  isosorbide mononitrate (IMDUR) 60 MG 24 hr tablet Take 1 tablet (60 mg total) by mouth daily. 12/10/14   Herminio Commons, MD  lisinopril (PRINIVIL,ZESTRIL) 10 MG tablet Take 1 tablet (10 mg total) by mouth daily. 10/29/14   Herminio Commons, MD  ondansetron (ZOFRAN) 4 MG tablet Take 1 tablet (4 mg total) by mouth every 8 (eight) hours as needed for nausea or vomiting. 02/25/15   Rolland Porter, MD  penicillin v  potassium (VEETID) 500 MG tablet Take 1 tablet (500 mg total) by mouth 4 (four) times daily. 02/25/15   Rolland Porter, MD  pravastatin (PRAVACHOL) 20 MG tablet Take 20 mg by mouth daily.    Historical Provider, MD  PROAIR HFA 108 (90 BASE) MCG/ACT inhaler Inhale 1-2 puffs into the lungs every 6 (six) hours as needed for wheezing or shortness of breath.  02/13/15   Historical Provider, MD  traMADol (ULTRAM) 50 MG tablet Take 2 tablets (100 mg total) by mouth every 6 (six) hours as needed. 02/25/15   Rolland Porter, MD   BP 133/90 mmHg  Pulse 75  Temp(Src) 98 F (36.7 C) (Oral)  Resp 20  Ht 5\' 1"  (1.549 m)  Wt 262 lb (118.842 kg)  BMI 49.53 kg/m2  SpO2 100%  Vital signs normal     Physical Exam  Constitutional: She is oriented to person, place, and time. She appears well-developed and well-nourished.  Non-toxic appearance. She does not appear ill. No distress.  obese  HENT:  Head: Normocephalic and atraumatic.  Right Ear: External ear normal.  Left Ear: Tympanic membrane, external ear and ear canal normal.  Nose: Nose normal. No mucosal edema or rhinorrhea.  Mouth/Throat: Oropharynx is clear and moist and mucous membranes are normal. No dental abscesses or uvula swelling.    TTP along L mandible without obvious swelling. Some mild redness present. Mild redness of the gums without obvious abscess.   Eyes: Conjunctivae and EOM are normal. Pupils are equal, round, and reactive to light.  Neck: Normal range of motion and full passive range of motion without pain. Neck supple.  Pulmonary/Chest: Effort normal. No respiratory distress. She has no rhonchi. She exhibits no crepitus.  Abdominal: Normal appearance. She exhibits no distension.  Musculoskeletal: Normal range of motion.  Moves all extremities well.   Neurological: She is alert and oriented to person, place, and time. She has normal strength. No cranial nerve deficit.  Skin: Skin is warm, dry and intact. No rash noted. No erythema. No  pallor.  Psychiatric: She has a normal mood and affect. Her speech is normal and behavior is normal. Her mood appears not anxious.  Nursing note and vitals reviewed.   ED Course  Procedures   Medications  sodium chloride 0.9 % bolus 1,000 mL (0 mLs Intravenous Stopped 02/25/15 0107)  clindamycin (CLEOCIN) IVPB 900 mg (0 mg Intravenous Stopped 02/25/15 0107)  ketorolac (TORADOL) 30 MG/ML injection 30 mg (30 mg Intravenous Given 02/24/15 2336)  ondansetron (ZOFRAN) injection 4 mg (4 mg Intravenous Given 02/24/15 2336)    DIAGNOSTIC STUDIES: Oxygen Saturation is 100% on RA, normal by my interpretation.    COORDINATION OF CARE: 11:17 PM - Discussed cavities in both teeth. Discussed need to get a dentist for definitive care. States she  had already called [facility] before but never followed through with an appointment. Discussed plans to order anti-nausea and pain medication, as well as antibiotics. Pt advised of plan for treatment and pt agrees.  Recheck 01:10 she is feeling better, nausea is gone. Feels need to have urine output after IV fluids. Pain is better, feels ready to go home.   Labs Review Labs Reviewed  CBG MONITORING, ED - Abnormal; Notable for the following:    Glucose-Capillary 164 (*)    All other components within normal limits   Laboratory interpretation all normal except mild hyperglycemia   Imaging Review No results found. I have personally reviewed and evaluated these images and lab results as part of my medical decision-making.   EKG Interpretation None      MDM   Final diagnoses:  Pain due to dental caries  Nausea and vomiting, vomiting of unspecified type    New Prescriptions   ONDANSETRON (ZOFRAN) 4 MG TABLET    Take 1 tablet (4 mg total) by mouth every 8 (eight) hours as needed for nausea or vomiting.   PENICILLIN V POTASSIUM (VEETID) 500 MG TABLET    Take 1 tablet (500 mg total) by mouth 4 (four) times daily.   TRAMADOL (ULTRAM) 50  MG TABLET    Take 2 tablets (100 mg total) by mouth every 6 (six) hours as needed.    Plan discharge  Rolland Porter, MD, FACEP   I personally performed the services described in this documentation, which was scribed in my presence. The recorded information has been reviewed and considered.  Rolland Porter, MD, Barbette Or, MD 02/25/15 [phone]

## 2015-02-24 NOTE — ED Notes (Signed)
Dr Knapp at bedside,  

## 2015-02-24 NOTE — ED Notes (Signed)
cbg of 164.

## 2015-02-24 NOTE — ED Notes (Signed)
Pt updated on plan of care,  

## 2015-02-24 NOTE — ED Notes (Signed)
Pt c/o dental and jaw pain that became worse tonight, pt reports that she was at work at has been having trouble swallowing and started throwing up, also feels that she is not able to open her mouth as well due to the swelling from her teeth,   pt alert in triage, speech clear, able to maintain secretions,

## 2015-02-25 MED ORDER — TRAMADOL HCL 50 MG PO TABS: 100.0000 mg | ORAL_TABLET | Freq: Four times a day (QID) | ORAL | Status: DC | PRN

## 2015-02-25 MED ORDER — PENICILLIN V POTASSIUM 500 MG PO TABS: 500.0000 mg | ORAL_TABLET | Freq: Four times a day (QID) | ORAL | Status: DC

## 2015-02-25 MED ORDER — ONDANSETRON HCL 4 MG PO TABS: 4.0000 mg | ORAL_TABLET | Freq: Three times a day (TID) | ORAL | Status: DC | PRN

## 2015-02-25 NOTE — ED Notes (Signed)
Pt states that she is feeling better, Dr Tomi Bamberger at bedside,

## 2015-02-25 NOTE — Discharge Instructions (Signed)
Try heat on your face. Take the Penicillin antibiotic until gone. Use heat on your face for comfort. Take the tramadol with acetaminophen 1000 mg + ibuprofen 600 mg 4 times a day for pain. Call [facility] to get an appointment to evaluate your teeth.  Recheck if you get a fever, you get swelling in your face or you seem to be getting worse instead of better.    Dental Caries Dental caries (also called tooth decay) is the most common oral disease. It can occur at any age but is more common in children and young adults.  HOW DENTAL CARIES DEVELOPS  The process of decay begins when bacteria and foods (particularly sugars and starches) combine in your mouth to produce plaque. Plaque is a substance that sticks to the hard, outer surface of a tooth (enamel). The bacteria in plaque produce acids that attack enamel. These acids may also attack the root surface of a tooth (cementum) if it is exposed. Repeated attacks dissolve these surfaces and create holes in the tooth (cavities). If left untreated, the acids destroy the other layers of the tooth.  RISK FACTORS  Frequent sipping of sugary beverages.   Frequent snacking on sugary and starchy foods, especially those that easily get stuck in the teeth.   Poor oral hygiene.   Dry mouth.   Substance abuse such as methamphetamine abuse.   Broken or poor-fitting dental restorations.   Eating disorders.   Gastroesophageal reflux disease (GERD).   Certain radiation treatments to the head and neck. SYMPTOMS In the early stages of dental caries, symptoms are seldom present. Sometimes white, chalky areas may be seen on the enamel or other tooth layers. In later stages, symptoms may include:  Pits and holes on the enamel.  Toothache after sweet, hot, or cold foods or drinks are consumed.  Pain around the tooth.  Swelling around the tooth. DIAGNOSIS  Most of the time, dental caries is detected during a regular dental checkup.  A diagnosis is made after a thorough medical and dental history is taken and the surfaces of your teeth are checked for signs of dental caries. Sometimes special instruments, such as lasers, are used to check for dental caries. Dental X-ray exams may be taken so that areas not visible to the eye (such as between the contact areas of the teeth) can be checked for cavities.  TREATMENT  If dental caries is in its early stages, it may be reversed with a fluoride treatment or an application of a remineralizing agent at the dental office. Thorough brushing and flossing at home is needed to aid these treatments. If it is in its later stages, treatment depends on the location and extent of tooth destruction:   If a small area of the tooth has been destroyed, the destroyed area will be removed and cavities will be filled with a material such as gold, silver amalgam, or composite resin.   If a large area of the tooth has been destroyed, the destroyed area will be removed and a cap (crown) will be fitted over the remaining tooth structure.   If the center part of the tooth (pulp) is affected, a procedure called a root canal will be needed before a filling or crown can be placed.   If most of the tooth has been destroyed, the tooth may need to be pulled (extracted). HOME CARE INSTRUCTIONS You can prevent, stop, or reverse dental caries at home by practicing good oral hygiene. Good oral hygiene includes:  Thoroughly cleaning your teeth at least twice a day with a toothbrush and dental floss.   Using a fluoride toothpaste. A fluoride mouth rinse may also be used if recommended by your dentist or health care provider.   Restricting the amount of sugary and starchy foods and sugary liquids you consume.   Avoiding frequent snacking on these foods and sipping of these liquids.   Keeping regular visits with a dentist for checkups and cleanings. PREVENTION   Practice good oral hygiene.  Consider a  dental sealant. A dental sealant is a coating material that is applied by your dentist to the pits and grooves of teeth. The sealant prevents food from being trapped in them. It may protect the teeth for several years.  Ask about fluoride supplements if you live in a community without fluorinated water or with water that has a low fluoride content. Use fluoride supplements as directed by your dentist or health care provider.  Allow fluoride varnish applications to teeth if directed by your dentist or health care provider. Document Released: 02/04/2002 Document Revised: 09/29/2013 Document Reviewed: 05/17/2012 [facility] Patient Information 2015 [place], [place]. This information is not intended to replace advice given to you by your health care provider. Make sure you discuss any questions you have with your health care provider.  Dental Pain Toothache is pain in or around a tooth. It may get worse with chewing or with cold or heat.  HOME CARE  Your dentist may use a numbing medicine during treatment. If so, you may need to avoid eating until the medicine wears off. Ask your dentist about this.  Only take medicine as told by your dentist or doctor.  Avoid chewing food near the painful tooth until after all treatment is done. Ask your dentist about this. GET HELP RIGHT AWAY IF:   The problem gets worse or new problems appear.  You have a fever.  There is redness and puffiness (swelling) of the face, jaw, or neck.  You cannot open your mouth.  There is pain in the jaw.  There is very bad pain that is not helped by medicine. MAKE SURE YOU:   Understand these instructions.  Will watch your condition.  Will get help right away if you are not doing well or get worse. Document Released: 11/01/2007 Document Revised: 08/07/2011 Document Reviewed: 11/01/2007 [facility] Patient Information 2015 [place], [place]. This information is not intended to replace advice given to you by your health care  provider. Make sure you discuss any questions you have with your health care provider.  Diet and Dental Disease What you eat affects the health of your teeth. Diet plays an important role in developing healthy teeth and preventing dental disease, such as:  Tooth decay.  Gum (periodontal) disease.  Developmental defects of the enamel. This is when visible surfaces of the tooth do not form properly, leaving the tooth more prone to decay.  Dental erosion. This is when the teeth wear away. Knowing which foods promote strong teeth and which foods to stay away from can help you prevent poor oral health. If your diet lacks proper nutrients, it may be difficult for the tissues in your mouth to prevent dental disease. FOODS THAT PROMOTE DENTAL DISEASE The following foods either contain acids or create acid in your mouth that increases the risk of tooth decay:  Sugary foods, such as candy and baked goods (cookies, cake).  Soft drinks (carbonated and non-carbonated) such as soda, sports drinks, and fruit juice.  Citrus fruits,  such as oranges and lemons.  Berries.  Honey.  Herbal teas that contain berries and other fruits.  Wines and other alcoholic beverages.  Vinegar or vinegar containing foods, such as pickles.  Starchy snacks such as crackers, potato chips, [place] fries, and pasta. Some of these foods have health benefits. Eat these foods in moderation. The more often you eat these foods, the more frequently you are exposing your teeth to the acid that causes dental diseases. FOODS THAT REDUCE THE RISK OF DENTAL DISEASE Certain foods help to keep the teeth strong and reduce the risk of tooth decay. These foods include:  Dairy products, such as cow's milk and cheese. Eating dairy with a meal or sugary snack reduces the risk of tooth decay.  Gums and foods that substitute sugar with sorbitol, mannitol, and xylitol.  Fluoride containing foods, such as black tea. Fluoride is a natural  mineral that protects the teeth from tooth decay. Your caregiver may recommend fluoride toothpaste or a fluoride supplement.  Breast milk. DIETARY RECOMMENDATIONS FOR HEALTHY TEETH  Eat a healthy, well-balanced diet with fiber-rich fruits and vegetables and quality proteins (eggs, meat, poultry, and fish). A variety of foods each day in moderation is best.  Avoid frequent sugary snacks in between meals.  Avoid frequent sticky, chewy, sugary candies, such as gummy bears and other candies that stick to the teeth. Avoid sucking on candies for a long time.  Avoid drinks that contain added sugar. Even though they do not sit in the mouth for very long, they can promote tooth decay if consumed too frequently.  Avoid sugary foods and drinks late at night.  Avoid swishing or holding acidic or sugary drinks in your mouth. Using a straw limits contact with the teeth.  If you like frequent sugary treats, try eating a sugary dessert after a meal or with a dairy product, rather than eating it by itself.  Avoid starchy foods such as graham crackers that stick to your teeth.  Eat highly acidic and sugary foods in moderation, especially if you tend to develop tooth decay. Eat citrus fruits or drinks 2 times per day or less. Limit foods with vinegar and sports drinks to 1 time per week.  Try rinsing your mouth with water after a sugary or acidic meal or drink. Rinsing may help to reduce the acid buildup in the mouth.  Limit alcohol.  Read labels to determine the amount of sugar in foods. PRACTICE GOOD DAILY ORAL HYGIENE   Have your teeth professionally cleaned at the dentist every 6 months.  Brush twice daily with a fluoride toothpaste.  Floss between your teeth daily.  Ask your caregiver if you need fluoride supplements or treatments.  Ask your caregiver if you should have sealants applied to some of your teeth. HOME CARE INSTRUCTIONS  Follow the guidelines included here to promote good oral  health.  Follow all of your caregiver's instructions for managing your health condition(s).  See your caregiver for follow-up exams as directed. Document Released: 01/11/2011 Document Revised: 08/07/2011 Document Reviewed: 01/11/2011 [facility] Patient Information 2015 [place], [place]. This information is not intended to replace advice given to you by your health care provider. Make sure you discuss any questions you have with your health care provider.  Nausea and Vomiting Nausea means you feel sick to your stomach. Throwing up (vomiting) is a reflex where stomach contents come out of your mouth. HOME CARE   Take medicine as told by your doctor.  Do not force yourself to  eat. However, you do need to drink fluids.  If you feel like eating, eat a normal diet as told by your doctor.  Eat rice, wheat, potatoes, bread, lean meats, yogurt, fruits, and vegetables.  Avoid high-fat foods.  Drink enough fluids to keep your pee (urine) clear or pale yellow.  Ask your doctor how to replace body fluid losses (rehydrate). Signs of body fluid loss (dehydration) include:  Feeling very thirsty.  Dry lips and mouth.  Feeling dizzy.  Dark pee.  Peeing less than normal.  Feeling confused.  Fast breathing or heart rate. GET HELP RIGHT AWAY IF:   You have blood in your throw up.  You have black or bloody poop (stool).  You have a bad headache or stiff neck.  You feel confused.  You have bad belly (abdominal) pain.  You have chest pain or trouble breathing.  You do not pee at least once every 8 hours.  You have cold, clammy skin.  You keep throwing up after 24 to 48 hours.  You have a fever. MAKE SURE YOU:   Understand these instructions.  Will watch your condition.  Will get help right away if you are not doing well or get worse. Document Released: 11/01/2007 Document Revised: 08/07/2011 Document Reviewed: 10/14/2010 [facility] Patient Information 2015 [place], [place]. This  information is not intended to replace advice given to you by your health care provider. Make sure you discuss any questions you have with your health care provider.

## 2015-02-26 ENCOUNTER — Ambulatory Visit (HOSPITAL_COMMUNITY): Payer: BLUE CROSS/BLUE SHIELD

## 2015-03-09 ENCOUNTER — Encounter: Payer: Self-pay | Admitting: Cardiovascular Disease

## 2015-03-09 ENCOUNTER — Ambulatory Visit (INDEPENDENT_AMBULATORY_CARE_PROVIDER_SITE_OTHER): Payer: BLUE CROSS/BLUE SHIELD | Admitting: Cardiovascular Disease

## 2015-03-09 VITALS — BP 148/92 | HR 74 | Ht 65.0 in | Wt 296.0 lb

## 2015-03-09 DIAGNOSIS — R079 Chest pain, unspecified: Secondary | ICD-10-CM

## 2015-03-09 DIAGNOSIS — R0609 Other forms of dyspnea: Secondary | ICD-10-CM

## 2015-03-09 DIAGNOSIS — R5383 Other fatigue: Secondary | ICD-10-CM | POA: Diagnosis not present

## 2015-03-09 DIAGNOSIS — I10 Essential (primary) hypertension: Secondary | ICD-10-CM

## 2015-03-09 DIAGNOSIS — E1165 Type 2 diabetes mellitus with hyperglycemia: Secondary | ICD-10-CM

## 2015-03-09 DIAGNOSIS — I517 Cardiomegaly: Secondary | ICD-10-CM

## 2015-03-09 DIAGNOSIS — E785 Hyperlipidemia, unspecified: Secondary | ICD-10-CM

## 2015-03-09 NOTE — Progress Notes (Signed)
Patient ID: NYLIA GAVINA, female   DOB: 26-Mar-1966, 49 y.o.   MRN: [id number]      SUBJECTIVE: The patient presents for follow-up after undergoing cardiac catheterization. She had angiographically normal coronary arteries and normal intracardiac pressures and hemodynamics. We spoke at length about the importance of exercise and weight loss.   Review of Systems: As per "subjective", otherwise negative.  No Known Allergies  Current Outpatient Prescriptions  Medication Sig Dispense Refill  . Acetaminophen-Caffeine (EXCEDRIN TENSION HEADACHE PO) Take 2 tablets by mouth daily.    . Canagliflozin-Metformin HCl ([phone] [id number] MG TABS Take 1 tablet by mouth daily.     Marland Kitchen desvenlafaxine (PRISTIQ) 50 MG 24 hr tablet Take 50 mg by mouth daily.    . hydrochlorothiazide (MICROZIDE) 12.5 MG capsule TAKE 1 TAB DAILY. TAKE EXTRA 12.5 MG DAILY AS NEEDED FOR SWELLING (Patient taking differently: Take 12.5 mg by mouth daily. TAKE EXTRA 12.5 MG DAILY AS NEEDED FOR SWELLING) 90 capsule 3  . isosorbide mononitrate (IMDUR) 60 MG 24 hr tablet Take 1 tablet (60 mg total) by mouth daily. 30 tablet 6  . lisinopril (PRINIVIL,ZESTRIL) 10 MG tablet Take 1 tablet (10 mg total) by mouth daily. 90 tablet 3  . ondansetron (ZOFRAN) 4 MG tablet Take 1 tablet (4 mg total) by mouth every 8 (eight) hours as needed for nausea or vomiting. 6 tablet 0  . penicillin v potassium (VEETID) 500 MG tablet Take 1 tablet (500 mg total) by mouth 4 (four) times daily. 40 tablet 0  . pravastatin (PRAVACHOL) 20 MG tablet Take 20 mg by mouth daily.    Marland Kitchen PROAIR HFA 108 (90 BASE) MCG/ACT inhaler Inhale 1-2 puffs into the lungs as needed for wheezing or shortness of breath.     . traMADol (ULTRAM) 50 MG tablet Take 2 tablets (100 mg total) by mouth every 6 (six) hours as needed. 16 tablet 0   No current facility-administered medications for this visit.    Past Medical History  Diagnosis Date  . Hypertension   . Cancer ([place])     cervical 1989  .  Diabetes mellitus without complication ([place])   . High cholesterol   . Enlarged heart     Past Surgical History  Procedure Laterality Date  . Cardiac catheterization    . Cardiac electrophysiology study and ablation    . Endometrial ablation    . Cardiac catheterization N/A 12/14/2014    Procedure: Right/Left Heart Cath and Coronary Angiography;  Surgeon: Sherren Mocha, MD;  Location: [facility];  Service: Cardiovascular;  Laterality: N/A;    Social History   Social History  . Marital Status: Married    Spouse Name: N/A  . Number of Children: N/A  . Years of Education: N/A   Occupational History  . Not on file.   Social History Main Topics  . Smoking status: Never Smoker   . Smokeless tobacco: Never Used  . Alcohol Use: Yes     Comment: seldom  . Drug Use: No  . Sexual Activity: Yes    Birth Control/ Protection: None   Other Topics Concern  . Not on file   Social History Narrative     Filed Vitals:   03/09/15 0918  BP: 148/92  Pulse: 74  Height: 5\' 5"  (1.651 m)  Weight: 296 lb (134.265 kg)  SpO2: 98%    PHYSICAL EXAM General: NAD HEENT: Normal. Neck: No JVD, no thyromegaly. Lungs: Clear to auscultation bilaterally with normal respiratory effort. CV: Nondisplaced PMI.  Regular rate and rhythm, normal S1/S2, no S3/S4, no murmur. No pretibial or periankle edema.    Abdomen: Soft, nontender, obese.  Neurologic: Alert and oriented x 3.  Psych: Normal affect. Skin: Normal. Musculoskeletal: No gross deformities. Extremities: No clubbing or cyanosis.   ECG: Most recent ECG reviewed.      ASSESSMENT AND PLAN: 1. Chest tightness with shortness of breath and palpitations: Noncardiac in etiology. Will d/c Imdur. Weight loss and exercise encouraged.  2. Essential HTN: Elevated today on lisinopril-HCTZ 10-12.5 mg daily. Controlled at last visit. Will monitor as lisinopril component may need to be increased. Weight loss and exercise encouraged.  3.  Hyperlipidemia: Continue pravastatin.  4. Marked fatigue with right-sided chamber enlargement: Likely due to morbid obesity and related hypoventilation syndrome. Normal intracardiac pressures. Weight loss and exercise encouraged.  5. Type 2 diabetes: Poorly controlled with HbA1C 8.7% on 6/24. Meds adjusted by PCP. Weight loss and exercise encouraged.  6. Fatigue: She needs a sleep apnea evaluation which I previously ordered. TSH normal.   7. Hand and feet swelling: Allergic to Lasix. Continue extra HCTZ 12.5 mg prn.   Dispo: f/u prn.   Kate Sable, M.D., F.A.C.C.

## 2015-03-09 NOTE — Patient Instructions (Signed)
   Stop Imdur. Continue all other medications.   Follow up as needed.

## 2015-03-12 ENCOUNTER — Ambulatory Visit (HOSPITAL_COMMUNITY): Payer: BLUE CROSS/BLUE SHIELD

## 2015-04-02 ENCOUNTER — Ambulatory Visit (HOSPITAL_COMMUNITY): Payer: BLUE CROSS/BLUE SHIELD

## 2015-06-13 ENCOUNTER — Emergency Department (HOSPITAL_COMMUNITY): Payer: BLUE CROSS/BLUE SHIELD

## 2015-06-13 ENCOUNTER — Emergency Department (HOSPITAL_COMMUNITY)
Admission: EM | Admit: 2015-06-13 | Discharge: 2015-06-13 | Disposition: A | Discharge status: Home / Self Care | Payer: BLUE CROSS/BLUE SHIELD | Attending: Emergency Medicine | Admitting: Emergency Medicine

## 2015-06-13 ENCOUNTER — Encounter (HOSPITAL_COMMUNITY): Payer: Self-pay | Admitting: Emergency Medicine

## 2015-06-13 DIAGNOSIS — Z7984 Long term (current) use of oral hypoglycemic drugs: Secondary | ICD-10-CM | POA: Diagnosis not present

## 2015-06-13 DIAGNOSIS — E7800 Pure hypercholesterolemia, unspecified: Secondary | ICD-10-CM | POA: Insufficient documentation

## 2015-06-13 DIAGNOSIS — Z9889 Other specified postprocedural states: Secondary | ICD-10-CM | POA: Insufficient documentation

## 2015-06-13 DIAGNOSIS — Z8541 Personal history of malignant neoplasm of cervix uteri: Secondary | ICD-10-CM | POA: Diagnosis not present

## 2015-06-13 DIAGNOSIS — E119 Type 2 diabetes mellitus without complications: Secondary | ICD-10-CM | POA: Insufficient documentation

## 2015-06-13 DIAGNOSIS — I10 Essential (primary) hypertension: Secondary | ICD-10-CM | POA: Insufficient documentation

## 2015-06-13 DIAGNOSIS — M79601 Pain in right arm: Secondary | ICD-10-CM | POA: Insufficient documentation

## 2015-06-13 DIAGNOSIS — Z79899 Other long term (current) drug therapy: Secondary | ICD-10-CM | POA: Diagnosis not present

## 2015-06-13 DIAGNOSIS — M791 Myalgia, unspecified site: Secondary | ICD-10-CM

## 2015-06-13 MED ORDER — HYDROCODONE-ACETAMINOPHEN 5-325 MG PO TABS: 2.0000 | ORAL_TABLET | ORAL | Status: DC | PRN

## 2015-06-13 MED ORDER — ENOXAPARIN SODIUM 150 MG/ML ~~LOC~~ SOLN: 1.0000 mg/kg | Freq: Once | SUBCUTANEOUS | Status: DC

## 2015-06-13 NOTE — Discharge Instructions (Signed)
Muscle Pain, Adult  Muscle pain (myalgia) may be caused by many things, including:  · Overuse or muscle strain, especially if you are not in shape. This is the most common cause of muscle pain.  · Injury.  · Bruises.  · Viruses, such as the flu.  · Infectious diseases.  · Fibromyalgia, which is a chronic condition that causes muscle tenderness, fatigue, and headache.  · Autoimmune diseases, including lupus.  · Certain drugs, including ACE inhibitors and statins.  Muscle pain may be mild or severe. In most cases, the pain lasts only a short time and goes away without treatment. To diagnose the cause of your muscle pain, your health care provider will take your medical history. This means he or she will ask you when your muscle pain began and what has been happening. If you have not had muscle pain for very long, your health care provider may want to wait before doing much testing. If your muscle pain has lasted a long time, your health care provider may want to run tests right away. If your health care provider thinks your muscle pain may be caused by illness, you may need to have additional tests to rule out certain conditions.   Treatment for muscle pain depends on the cause. Home care is often enough to relieve muscle pain. Your health care provider may also prescribe anti-inflammatory medicine.  HOME CARE INSTRUCTIONS  Watch your condition for any changes. The following actions may help to lessen any discomfort you are feeling:  · Only take over-the-counter or prescription medicines as directed by your health care provider.  · Apply ice to the sore muscle:    Put ice in a plastic bag.    Place a towel between your skin and the bag.    Leave the ice on for 15-20 minutes, 3-4 times a day.  · You may alternate applying hot and cold packs to the muscle as directed by your health care provider.  · If overuse is causing your muscle pain, slow down your activities until the pain goes away.    Remember that it is normal  to feel some muscle pain after starting a workout program. Muscles that have not been used often will be sore at first.    Do regular, gentle exercises if you are not usually active.    Warm up before exercising to lower your risk of muscle pain.  · Do not continue working out if the pain is very bad. Bad pain could mean you have injured a muscle.  SEEK MEDICAL CARE IF:  · Your muscle pain gets worse, and medicines do not help.  · You have muscle pain that lasts longer than 3 days.  · You have a rash or fever along with muscle pain.  · You have muscle pain after a tick bite.  · You have muscle pain while working out, even though you are in good physical condition.  · You have redness, soreness, or swelling along with muscle pain.  · You have muscle pain after starting a new medicine or changing the dose of a medicine.  SEEK IMMEDIATE MEDICAL CARE IF:  · You have trouble breathing.  · You have trouble swallowing.  · You have muscle pain along with a stiff neck, fever, and vomiting.  · You have severe muscle weakness or cannot move part of your body.  MAKE SURE YOU:   · Understand these instructions.  · Will watch your condition.  · Will get   help right away if you are not doing well or get worse.     This information is not intended to replace advice given to you by your health care provider. Make sure you discuss any questions you have with your health care provider.     Document Released: 04/06/2006 Document Revised: 06/05/2014 Document Reviewed: 03/11/2013  Elsevier Interactive Patient Education ©2016 [employer]

## 2015-06-13 NOTE — ED Notes (Signed)
Patient c/o pain in right arm. Per patient pain started 2 days ago and pain radiates up arm and into back. Patient reports swelling and numbness in arm. Denies any known injury. Patient has hx of blood clot in left arm. Per patient tightness in chest but states "I always have tightness in chest, due to my enlarged heart I think."

## 2015-06-13 NOTE — ED Provider Notes (Signed)
CSN: [id number]     Arrival date & time 06/13/15  0857 History   First MD Initiated Contact with Patient 06/13/15 [phone]     Chief Complaint  Patient presents with  . Arm Pain     (Consider location/radiation/quality/duration/timing/severity/associated sxs/prior Treatment) Patient is a 50 y.o. female presenting with arm injury. The history is provided by the patient. No language interpreter was used.  Arm Injury Location:  Arm Arm location:  R arm Pain details:    Quality:  Aching   Severity:  Moderate   Onset quality:  Gradual   Timing:  Constant Chronicity:  New Prior injury to area:  No Worsened by:  Nothing tried Ineffective treatments:  None tried Associated symptoms: swelling   Associated symptoms: no numbness   Pt reports her arm feels like her left arm did when she had a dvt.  Pt is concerned because arm seems swollen and she has pain  Past Medical History  Diagnosis Date  . Hypertension   . Cancer ([place])     cervical 1989  . Diabetes mellitus without complication ([place])   . High cholesterol   . Enlarged heart    Past Surgical History  Procedure Laterality Date  . Cardiac catheterization    . Cardiac electrophysiology study and ablation    . Endometrial ablation    . Cardiac catheterization N/A 12/14/2014    Procedure: Right/Left Heart Cath and Coronary Angiography;  Surgeon: Sherren Mocha, MD;  Location: [facility];  Service: Cardiovascular;  Laterality: N/A;   Family History  Problem Relation Age of Onset  . Cancer Mother   . Cancer Father   . Stroke Brother   . Hypertension Brother    Social History  Substance Use Topics  . Smoking status: Never Smoker   . Smokeless tobacco: Never Used  . Alcohol Use: Yes     Comment: seldom   OB History    Gravida Para Term Preterm AB TAB SAB Ectopic Multiple Living   3 2 2  1  1   2      Review of Systems  All other systems reviewed and are negative.     Allergies  Review of patient's allergies  indicates no known allergies.  Home Medications   Prior to Admission medications   Medication Sig Start Date End Date Taking? Authorizing Provider  acetaminophen (TYLENOL) 500 MG tablet Take 500 mg by mouth every 4 (four) hours as needed for mild pain.   Yes Historical Provider, MD  ibuprofen (ADVIL,MOTRIN) 200 MG tablet Take 400 mg by mouth daily as needed for mild pain.   Yes Historical Provider, MD  lisinopril-hydrochlorothiazide (PRINZIDE,ZESTORETIC) 10-12.5 MG tablet Take 1 tablet by mouth daily. 06/03/15  Yes Historical Provider, MD  metFORMIN (GLUCOPHAGE) 500 MG tablet Take 1 tablet by mouth 2 (two) times daily. 05/13/15  Yes Historical Provider, MD  pravastatin (PRAVACHOL) 20 MG tablet Take 20 mg by mouth daily.   Yes Historical Provider, MD  PROAIR HFA 108 (90 BASE) MCG/ACT inhaler Inhale 1-2 puffs into the lungs as needed for wheezing or shortness of breath.  02/13/15  Yes Historical Provider, MD  hydrochlorothiazide (MICROZIDE) 12.5 MG capsule TAKE 1 TAB DAILY. TAKE EXTRA 12.5 MG DAILY AS NEEDED FOR SWELLING Patient not taking: Reported on 06/13/2015 10/29/14   Herminio Commons, MD  lisinopril (PRINIVIL,ZESTRIL) 10 MG tablet Take 1 tablet (10 mg total) by mouth daily. Patient not taking: Reported on 06/13/2015 10/29/14   Herminio Commons, MD  ondansetron (  ZOFRAN) 4 MG tablet Take 1 tablet (4 mg total) by mouth every 8 (eight) hours as needed for nausea or vomiting. Patient not taking: Reported on 06/13/2015 02/25/15   Rolland Porter, MD  penicillin v potassium (VEETID) 500 MG tablet Take 1 tablet (500 mg total) by mouth 4 (four) times daily. Patient not taking: Reported on 06/13/2015 02/25/15   Rolland Porter, MD  traMADol (ULTRAM) 50 MG tablet Take 2 tablets (100 mg total) by mouth every 6 (six) hours as needed. Patient not taking: Reported on 06/13/2015 02/25/15   Rolland Porter, MD   BP 121/82 mmHg  Pulse 75  Temp(Src) 98.2 F (36.8 C) (Oral)  Resp 15  Ht 5\' 5"  (1.651 m)  Wt 127.007 kg  BMI 46.59  kg/m2  SpO2 98% Physical Exam  Constitutional: She is oriented to person, place, and time. She appears well-developed and well-nourished.  HENT:  Head: Normocephalic and atraumatic.  Eyes: EOM are normal. Pupils are equal, round, and reactive to light.  Neck: Normal range of motion.  Cardiovascular: Normal rate and normal heart sounds.   Pulmonary/Chest: Effort normal.  Abdominal: Soft. She exhibits no distension.  Musculoskeletal: She exhibits tenderness.  Right arm diffusely tender,  No gross swelling when arm is compared with left arm.   Neurological: She is alert and oriented to person, place, and time.  Skin: Skin is warm.  Psychiatric: She has a normal mood and affect.  Nursing note and vitals reviewed.   ED Course  Procedures (including critical care time) Labs Review Labs Reviewed - No data to display  Imaging Review [place] Extrem Up Right Comp  06/13/2015  CLINICAL DATA:  Right upper extremity pain and swelling 2 days. EXAM: Right UPPER EXTREMITY VENOUS DOPPLER ULTRASOUND TECHNIQUE: Gray-scale sonography with graded compression, as well as color Doppler and duplex ultrasound were performed to evaluate the upper extremity deep venous system from the level of the subclavian vein and including the jugular, axillary, basilic, radial, ulnar and upper cephalic vein. Spectral Doppler was utilized to evaluate flow at rest and with distal augmentation maneuvers. COMPARISON:  None. FINDINGS: Contralateral Subclavian Vein:  Not evaluated. Internal Jugular Vein: No evidence of thrombus. Normal compressibility, respiratory phasicity and response to augmentation. Subclavian Vein: No evidence of thrombus. Normal compressibility, respiratory phasicity and response to augmentation. Axillary Vein: No evidence of thrombus. Normal compressibility, respiratory phasicity and response to augmentation. Cephalic Vein: No evidence of thrombus. Normal compressibility, respiratory phasicity and response to  augmentation. Basilic Vein: No evidence of thrombus. Normal compressibility, respiratory phasicity and response to augmentation. Brachial Veins: No evidence of thrombus. Normal compressibility, respiratory phasicity and response to augmentation. Radial Veins: No evidence of thrombus. Normal compressibility, respiratory phasicity and response to augmentation. Ulnar Veins: No evidence of thrombus. Normal compressibility, respiratory phasicity and response to augmentation. Venous Reflux:  None visualized. Other Findings:  None visualized. IMPRESSION: No evidence of deep venous thrombosis. Electronically Signed   By: Marin Olp M.D.   On: 06/13/2015 11:53   I have personally reviewed and evaluated these images and lab results as part of my medical decision-making.   EKG Interpretation None      MDM possible muscle pain,  Possible nerve pain from neuropathy   Final diagnoses:  Muscle pain    Pt counseled on normal doppler.  Pt given rx for hydrocodone Pt advised to see her MD for recheck     Fransico Meadow, PA-C 06/13/15 [place], MD 06/16/15 [phone]

## 2015-07-07 ENCOUNTER — Encounter (HOSPITAL_COMMUNITY): Payer: Self-pay | Admitting: Emergency Medicine

## 2015-07-07 ENCOUNTER — Emergency Department (HOSPITAL_COMMUNITY)
Admission: EM | Admit: 2015-07-07 | Discharge: 2015-07-08 | Disposition: A | Discharge status: Home / Self Care | Payer: BLUE CROSS/BLUE SHIELD | Attending: Emergency Medicine | Admitting: Emergency Medicine

## 2015-07-07 DIAGNOSIS — Z9889 Other specified postprocedural states: Secondary | ICD-10-CM | POA: Insufficient documentation

## 2015-07-07 DIAGNOSIS — I10 Essential (primary) hypertension: Secondary | ICD-10-CM | POA: Diagnosis not present

## 2015-07-07 DIAGNOSIS — E7800 Pure hypercholesterolemia, unspecified: Secondary | ICD-10-CM | POA: Diagnosis not present

## 2015-07-07 DIAGNOSIS — E1165 Type 2 diabetes mellitus with hyperglycemia: Secondary | ICD-10-CM | POA: Diagnosis not present

## 2015-07-07 DIAGNOSIS — Z79899 Other long term (current) drug therapy: Secondary | ICD-10-CM | POA: Insufficient documentation

## 2015-07-07 DIAGNOSIS — Z7984 Long term (current) use of oral hypoglycemic drugs: Secondary | ICD-10-CM | POA: Diagnosis not present

## 2015-07-07 DIAGNOSIS — R739 Hyperglycemia, unspecified: Secondary | ICD-10-CM

## 2015-07-07 DIAGNOSIS — C538 Malignant neoplasm of overlapping sites of cervix uteri: Secondary | ICD-10-CM | POA: Diagnosis not present

## 2015-07-07 LAB — BASIC METABOLIC PANEL
ANION GAP: 9 (ref 5–15)
BUN: 19 mg/dL (ref 6–20)
CHLORIDE: 100 mmol/L — AB (ref 101–111)
CO2: 26 mmol/L (ref 22–32)
CREATININE: 0.6 mg/dL (ref 0.44–1.00)
Calcium: 9.1 mg/dL (ref 8.9–10.3)
GFR calc non Af Amer: >60 mL/min (ref >60)
Glucose, Bld: 223 mg/dL — ABNORMAL HIGH (ref 65–99)
Potassium: 3.9 mmol/L (ref 3.5–5.1)
Sodium: 135 mmol/L (ref 135–145)

## 2015-07-07 LAB — CBC WITH DIFFERENTIAL/PLATELET
Basophils Absolute: 0.1 10*3/uL (ref 0.0–0.1)
Basophils Relative: 1 %
Eosinophils Absolute: 0.2 10*3/uL (ref 0.0–0.7)
Eosinophils Relative: 2 %
HEMATOCRIT: 42 % (ref 36.0–46.0)
HEMOGLOBIN: 14.5 g/dL (ref 12.0–15.0)
LYMPHS ABS: 3.7 10*3/uL (ref 0.7–4.0)
LYMPHS PCT: 39 %
MCH: 30.5 pg (ref 26.0–34.0)
MCHC: 34.5 g/dL (ref 30.0–36.0)
MCV: 88.2 fL (ref 78.0–100.0)
MONOS PCT: 7 %
Monocytes Absolute: 0.6 10*3/uL (ref 0.1–1.0)
NEUTROS ABS: 4.9 10*3/uL (ref 1.7–7.7)
NEUTROS PCT: 51 %
Platelets: 249 10*3/uL (ref 150–400)
RBC: 4.76 MIL/uL (ref 3.87–5.11)
RDW: 13.1 % (ref 11.5–15.5)
WBC: 9.4 10*3/uL (ref 4.0–10.5)

## 2015-07-07 LAB — URINE MICROSCOPIC-ADD ON: RBC / HPF: NONE SEEN RBC/hpf (ref 0–5)

## 2015-07-07 LAB — URINALYSIS, ROUTINE W REFLEX MICROSCOPIC
BILIRUBIN URINE: NEGATIVE
Glucose, UA: >1000 mg/dL — AB
HGB URINE DIPSTICK: NEGATIVE
Ketones, ur: NEGATIVE mg/dL
Leukocytes, UA: NEGATIVE
Nitrite: NEGATIVE
PH: 5.5 (ref 5.0–8.0)
Protein, ur: NEGATIVE mg/dL

## 2015-07-07 LAB — CBG MONITORING, ED: Glucose-Capillary: 223 mg/dL — ABNORMAL HIGH (ref 65–99)

## 2015-07-07 MED ORDER — SODIUM CHLORIDE 0.9 % IV BOLUS (SEPSIS)
2000.0000 mL | Freq: Once | INTRAVENOUS | Status: AC
  Administered 2015-07-07: 2000 mL via INTRAVENOUS

## 2015-07-07 NOTE — ED Provider Notes (Signed)
CSN: [id number]     Arrival date & time 07/07/15  2024 History  By signing my name below, I, Doran Stabler, attest that this documentation has been prepared under the direction and in the presence of Daleen Bo, MD. Electronically Signed: Doran Stabler, ED Scribe. 07/07/2015. 9:45 PM.   Chief Complaint  Patient presents with  . Hyperglycemia   The history is provided by the patient. No language interpreter was used.   HPI Comments: Christina Cameron is a 50 y.o. female with a PMHx of HTN, DM, HLD, and enlarged heart who presents to the Emergency Department complaining of a possible hyperglycemic episode. Pt reports that she was at work this evening when she felt confused. She also experienced a dry mouth more than usual. When she checked her blood glucose at work, she found it was 389. Pt reports her blood glucose reading has been high throughout the week. She reports levels of 313, 269, 212. She states that she checks her levels 2-3 times a day. Pt states she is mostly is sitting at work and "tries" to monitor her diet. Pt does not see a nutritionist Pt denies any dysuria, cough or any other sx at this time.   Past Medical History  Diagnosis Date  . Hypertension   . Cancer ([place])     cervical 1989  . Diabetes mellitus without complication ([place])   . High cholesterol   . Enlarged heart    Past Surgical History  Procedure Laterality Date  . Cardiac catheterization    . Cardiac electrophysiology study and ablation    . Endometrial ablation    . Cardiac catheterization N/A 12/14/2014    Procedure: Right/Left Heart Cath and Coronary Angiography;  Surgeon: Sherren Mocha, MD;  Location: [facility];  Service: Cardiovascular;  Laterality: N/A;   Family History  Problem Relation Age of Onset  . Cancer Mother   . Cancer Father   . Stroke Brother   . Hypertension Brother    Social History  Substance Use Topics  . Smoking status: Never Smoker   . Smokeless tobacco: Never Used  .  Alcohol Use: Yes     Comment: seldom   OB History    Gravida Para Term Preterm AB TAB SAB Ectopic Multiple Living   3 2 2  1  1   2      Review of Systems  Respiratory: Negative for cough.   Genitourinary: Negative for dysuria.  Psychiatric/Behavioral: Positive for confusion.  All other systems reviewed and are negative.   Allergies  Review of patient's allergies indicates no known allergies.  Home Medications   Prior to Admission medications   Medication Sig Start Date End Date Taking? Authorizing Provider  acetaminophen (TYLENOL) 500 MG tablet Take 500 mg by mouth every 4 (four) hours as needed for mild pain.    Historical Provider, MD  hydrochlorothiazide (MICROZIDE) 12.5 MG capsule TAKE 1 TAB DAILY. TAKE EXTRA 12.5 MG DAILY AS NEEDED FOR SWELLING Patient not taking: Reported on 06/13/2015 10/29/14   Herminio Commons, MD  HYDROcodone-acetaminophen (NORCO/VICODIN) 5-325 MG tablet Take 2 tablets by mouth every 4 (four) hours as needed. 06/13/15   Fransico Meadow, PA-C  ibuprofen (ADVIL,MOTRIN) 200 MG tablet Take 400 mg by mouth daily as needed for mild pain.    Historical Provider, MD  lisinopril (PRINIVIL,ZESTRIL) 10 MG tablet Take 1 tablet (10 mg total) by mouth daily. Patient not taking: Reported on 06/13/2015 10/29/14   Herminio Commons, MD  lisinopril-hydrochlorothiazide (  PRINZIDE,ZESTORETIC) 10-12.5 MG tablet Take 1 tablet by mouth daily. 06/03/15   Historical Provider, MD  metFORMIN (GLUCOPHAGE) 500 MG tablet Take 1 tablet by mouth 2 (two) times daily. 05/13/15   Historical Provider, MD  ondansetron (ZOFRAN) 4 MG tablet Take 1 tablet (4 mg total) by mouth every 8 (eight) hours as needed for nausea or vomiting. Patient not taking: Reported on 06/13/2015 02/25/15   Rolland Porter, MD  penicillin v potassium (VEETID) 500 MG tablet Take 1 tablet (500 mg total) by mouth 4 (four) times daily. Patient not taking: Reported on 06/13/2015 02/25/15   Rolland Porter, MD  pravastatin (PRAVACHOL) 20 MG  tablet Take 20 mg by mouth daily.    Historical Provider, MD  PROAIR HFA 108 (90 BASE) MCG/ACT inhaler Inhale 1-2 puffs into the lungs as needed for wheezing or shortness of breath.  02/13/15   Historical Provider, MD  traMADol (ULTRAM) 50 MG tablet Take 2 tablets (100 mg total) by mouth every 6 (six) hours as needed. Patient not taking: Reported on 06/13/2015 02/25/15   Rolland Porter, MD   BP 150/90 mmHg  Pulse 84  Temp(Src) 98 F (36.7 C) (Oral)  Resp 18  Ht 5\' 5"  (1.651 m)  Wt 280 lb (127.007 kg)  BMI 46.59 kg/m2  SpO2 100%   Physical Exam  Constitutional: She is oriented to person, place, and time. She appears well-developed and well-nourished.  Morbidly obese.  HENT:  Head: Normocephalic and atraumatic.  Eyes: Conjunctivae and EOM are normal. Pupils are equal, round, and reactive to light.  Neck: Normal range of motion and phonation normal. Neck supple.  Cardiovascular: Normal rate and regular rhythm.   Pulmonary/Chest: Effort normal and breath sounds normal. She exhibits no tenderness.  Abdominal: Soft. She exhibits no distension. There is no tenderness. There is no guarding.  Musculoskeletal: Normal range of motion.  Neurological: She is alert and oriented to person, place, and time. She exhibits normal muscle tone.  Skin: Skin is warm and dry.  Psychiatric: She has a normal mood and affect. Her behavior is normal. Judgment and thought content normal.  Nursing note and vitals reviewed.   ED Course  Procedures   DIAGNOSTIC STUDIES: Oxygen Saturation is 100% on room air, normal by my interpretation.    COORDINATION OF CARE: 9:25 PM Will order Urinalysis, and blood work. Will give fluids. Discussed treatment plan with pt at bedside and pt agreed to plan.  Medications  sodium chloride 0.9 % bolus 2,000 mL (2,000 mLs Intravenous New Bag/Given 07/07/15 2145)    Patient Vitals for the past 24 hrs:  BP Temp Temp src Pulse Resp SpO2 Height Weight  07/08/15 0000 120/65 mmHg - - 76  - 100 % - -  07/07/15 2330 115/66 mmHg - - 73 - 98 % - -  07/07/15 2300 115/69 mmHg - - 80 - 99 % - -  07/07/15 2256 125/71 mmHg - - 77 18 99 % - -  07/07/15 2029 150/90 mmHg 98 F (36.7 C) Oral 84 18 100 % 5\' 5"  (1.651 m) 280 lb (127.007 kg)    12:12 AM Reevaluation with update and discussion. After initial assessment and treatment, an updated evaluation reveals cbg improved. [place] Review Labs Reviewed  CBG MONITORING, ED - Abnormal; Notable for the following:    Glucose-Capillary 223 (*)    All other components within normal limits  CBC WITH DIFFERENTIAL/PLATELET  BASIC METABOLIC PANEL  URINALYSIS, ROUTINE W REFLEX MICROSCOPIC (NOT AT [facility])  MDM   Final diagnoses:  Hyperglycemia without ketosis    Hyperglycemia , likely related to noncompliance with low carbohydrate diet. I doubt ketosis, serious bacterial infection or metabolic instability.  Nursing Notes Reviewed/ Care Coordinated Applicable Imaging Reviewed Interpretation of Laboratory Data incorporated into ED treatment  The patient appears reasonably screened and/or stabilized for discharge and I doubt any other medical condition or other [facility] requiring further screening, evaluation, or treatment in the ED at this time prior to discharge.  Plan: Home Medications- usual; Home Treatments- rest; return here if the recommended treatment, does not improve the symptoms; Recommended follow up- PCP 1 week for check up, and referral to nutritionist   I personally performed the services described in this documentation, which was scribed in my presence. The recorded information has been reviewed and is accurate.    Daleen Bo, MD 07/08/15 [phone]

## 2015-07-07 NOTE — ED Notes (Signed)
Glucose=223

## 2015-07-07 NOTE — ED Notes (Addendum)
Pt states she felt confused at work and they took her blood sugar it was 389. Pt states she was dry mouth and could not talk good per pt. It happened around 1930.

## 2015-07-07 NOTE — ED Notes (Signed)
Blood sugar, 180.

## 2015-07-08 LAB — CBG MONITORING, ED: GLUCOSE-CAPILLARY: 180 mg/dL — AB (ref 65–99)

## 2015-07-08 NOTE — Discharge Instructions (Signed)
Drink 2 L of water each day  Be very careful about your carbohydrate intake    Hyperglycemia Hyperglycemia occurs when the glucose (sugar) in your blood is too high. Hyperglycemia can happen for many reasons, but it most often happens to people who do not know they have diabetes or are not managing their diabetes properly.  CAUSES  Whether you have diabetes or not, there are other causes of hyperglycemia. Hyperglycemia can occur when you have diabetes, but it can also occur in other situations that you might not be as aware of, such as: Diabetes  If you have diabetes and are having problems controlling your blood glucose, hyperglycemia could occur because of some of the following reasons:  Not following your meal plan.  Not taking your diabetes medications or not taking it properly.  Exercising less or doing less activity than you normally do.  Being sick. Pre-diabetes  This cannot be ignored. Before people develop Type 2 diabetes, they almost always have "pre-diabetes." This is when your blood glucose levels are higher than normal, but not yet high enough to be diagnosed as diabetes. Research has shown that some long-term damage to the body, especially the heart and circulatory system, may already be occurring during pre-diabetes. If you take action to manage your blood glucose when you have pre-diabetes, you may delay or prevent Type 2 diabetes from developing. Stress  If you have diabetes, you may be "diet" controlled or on oral medications or insulin to control your diabetes. However, you may find that your blood glucose is higher than usual in the hospital whether you have diabetes or not. This is often referred to as "stress hyperglycemia." Stress can elevate your blood glucose. This happens because of hormones put out by the body during times of stress. If stress has been the cause of your high blood glucose, it can be followed regularly by your caregiver. That way he/she can make  sure your hyperglycemia does not continue to get worse or progress to diabetes. Steroids  Steroids are medications that act on the infection fighting system (immune system) to block inflammation or infection. One side effect can be a rise in blood glucose. Most people can produce enough extra insulin to allow for this rise, but for those who cannot, steroids make blood glucose levels go even higher. It is not unusual for steroid treatments to "uncover" diabetes that is developing. It is not always possible to determine if the hyperglycemia will go away after the steroids are stopped. A special blood test called an A1c is sometimes done to determine if your blood glucose was elevated before the steroids were started. SYMPTOMS  Thirsty.  Frequent urination.  Dry mouth.  Blurred vision.  Tired or fatigue.  Weakness.  Sleepy.  Tingling in feet or leg. DIAGNOSIS  Diagnosis is made by monitoring blood glucose in one or all of the following ways:  A1c test. This is a chemical found in your blood.  Fingerstick blood glucose monitoring.  Laboratory results. TREATMENT  First, knowing the cause of the hyperglycemia is important before the hyperglycemia can be treated. Treatment may include, but is not be limited to:  Education.  Change or adjustment in medications.  Change or adjustment in meal plan.  Treatment for an illness, infection, etc.  More frequent blood glucose monitoring.  Change in exercise plan.  Decreasing or stopping steroids.  Lifestyle changes. HOME CARE INSTRUCTIONS   Test your blood glucose as directed.  Exercise regularly. Your caregiver will give you  instructions about exercise. Pre-diabetes or diabetes which comes on with stress is helped by exercising.  Eat wholesome, balanced meals. Eat often and at regular, fixed times. Your caregiver or nutritionist will give you a meal plan to guide your sugar intake.  Being at an ideal weight is important. If  needed, losing as little as 10 to 15 pounds may help improve blood glucose levels. SEEK MEDICAL CARE IF:   You have questions about medicine, activity, or diet.  You continue to have symptoms (problems such as increased thirst, urination, or weight gain). SEEK IMMEDIATE MEDICAL CARE IF:   You are vomiting or have diarrhea.  Your breath smells fruity.  You are breathing faster or slower.  You are very sleepy or incoherent.  You have numbness, tingling, or pain in your feet or hands.  You have chest pain.  Your symptoms get worse even though you have been following your caregiver's orders.  If you have any other questions or concerns.   This information is not intended to replace advice given to you by your health care provider. Make sure you discuss any questions you have with your health care provider.   Document Released: 11/08/2000 Document Revised: 08/07/2011 Document Reviewed: 01/19/2015 Elsevier Interactive Patient Education [employer].

## 2015-08-26 ENCOUNTER — Other Ambulatory Visit (HOSPITAL_COMMUNITY): Payer: Self-pay | Admitting: Respiratory Therapy

## 2015-08-26 DIAGNOSIS — G4730 Sleep apnea, unspecified: Secondary | ICD-10-CM

## 2015-09-24 ENCOUNTER — Other Ambulatory Visit (HOSPITAL_COMMUNITY): Payer: Self-pay | Admitting: Respiratory Therapy

## 2015-09-24 DIAGNOSIS — G4730 Sleep apnea, unspecified: Secondary | ICD-10-CM

## 2015-11-23 DIAGNOSIS — E785 Hyperlipidemia, unspecified: Secondary | ICD-10-CM | POA: Diagnosis not present

## 2015-11-23 DIAGNOSIS — G4730 Sleep apnea, unspecified: Secondary | ICD-10-CM | POA: Diagnosis not present

## 2015-11-23 DIAGNOSIS — E669 Obesity, unspecified: Secondary | ICD-10-CM | POA: Diagnosis not present

## 2015-11-23 DIAGNOSIS — E1165 Type 2 diabetes mellitus with hyperglycemia: Secondary | ICD-10-CM | POA: Diagnosis not present

## 2015-11-24 DIAGNOSIS — E785 Hyperlipidemia, unspecified: Secondary | ICD-10-CM | POA: Diagnosis not present

## 2015-11-24 DIAGNOSIS — I10 Essential (primary) hypertension: Secondary | ICD-10-CM | POA: Diagnosis not present

## 2015-11-24 DIAGNOSIS — E1165 Type 2 diabetes mellitus with hyperglycemia: Secondary | ICD-10-CM | POA: Diagnosis not present

## 2015-11-24 DIAGNOSIS — E669 Obesity, unspecified: Secondary | ICD-10-CM | POA: Diagnosis not present

## 2015-11-29 ENCOUNTER — Encounter: Payer: Self-pay | Admitting: Cardiovascular Disease

## 2015-11-29 ENCOUNTER — Ambulatory Visit (INDEPENDENT_AMBULATORY_CARE_PROVIDER_SITE_OTHER): Payer: BLUE CROSS/BLUE SHIELD | Admitting: Cardiovascular Disease

## 2015-11-29 VITALS — BP 124/62 | HR 78 | Ht 65.0 in | Wt 295.0 lb

## 2015-11-29 DIAGNOSIS — E785 Hyperlipidemia, unspecified: Secondary | ICD-10-CM

## 2015-11-29 DIAGNOSIS — R5383 Other fatigue: Secondary | ICD-10-CM

## 2015-11-29 DIAGNOSIS — R002 Palpitations: Secondary | ICD-10-CM

## 2015-11-29 DIAGNOSIS — R079 Chest pain, unspecified: Secondary | ICD-10-CM | POA: Diagnosis not present

## 2015-11-29 DIAGNOSIS — R0602 Shortness of breath: Secondary | ICD-10-CM

## 2015-11-29 DIAGNOSIS — I10 Essential (primary) hypertension: Secondary | ICD-10-CM

## 2015-11-29 NOTE — Patient Instructions (Signed)
Medication Instructions:  Your physician recommends that you continue on your current medications as directed. Please refer to the Current Medication list given to you today.   Labwork: none  Testing/Procedures: Your physician has requested that you have an echocardiogram. Echocardiography is a painless test that uses sound waves to create images of your heart. It provides your doctor with information about the size and shape of your heart and how well your heart's chambers and valves are working. This procedure takes approximately one hour. There are no restrictions for this procedure.    Follow-Up: Your physician recommends that you schedule a follow-up appointment in: to be determined    Any Other Special Instructions Will Be Listed Below (If Applicable).     If you need a refill on your cardiac medications before your next appointment, please call your pharmacy.

## 2015-11-29 NOTE — Progress Notes (Signed)
Patient ID: Christina Cameron, female   DOB: 09-21-1965, 50 y.o.   MRN: [id number]      SUBJECTIVE: The patient is a 50 year old woman with a history of morbid obesity, chest tightness, shortness of breath, palpitations, hypertension, type 2 diabetes, and hyperlipidemia. Her chest tightness was deemed noncardiac in etiology as she had normal coronary arteries by cardiac catheterization. When I saw her on 03/09/15 I recommended as needed follow-up.  Approximately 8 days ago she had an episode at home of chest tightness accompanied by diaphoresis and palpitations. Her husband try to convince her to go to the ED but because she knew she had normal coronary arteries, she did not go. She has felt more fatigued and weak since then. She said her PCP check blood tests which were normal. She does say her diabetes and hypercholesterolemia are poorly controlled.   Review of Systems: As per "subjective", otherwise negative.  No Known Allergies  Current Outpatient Prescriptions  Medication Sig Dispense Refill  . acetaminophen (TYLENOL) 500 MG tablet Take 500 mg by mouth every 4 (four) hours as needed for mild pain.    . hydrochlorothiazide (MICROZIDE) 12.5 MG capsule TAKE 1 TAB DAILY. TAKE EXTRA 12.5 MG DAILY AS NEEDED FOR SWELLING 90 capsule 3  . ibuprofen (ADVIL,MOTRIN) 200 MG tablet Take 400 mg by mouth daily as needed for mild pain.    Marland Kitchen lisinopril (PRINIVIL,ZESTRIL) 10 MG tablet Take 1 tablet (10 mg total) by mouth daily. 90 tablet 3  . PROAIR HFA 108 (90 BASE) MCG/ACT inhaler Inhale 1-2 puffs into the lungs as needed for wheezing or shortness of breath.     Marland Kitchen SYNJARDY 12.5-500 MG TABS      No current facility-administered medications for this visit.    Past Medical History  Diagnosis Date  . Hypertension   . Cancer ([place])     cervical 1989  . Diabetes mellitus without complication ([place])   . High cholesterol   . Enlarged heart     Past Surgical History  Procedure Laterality Date  . Cardiac  catheterization    . Cardiac electrophysiology study and ablation    . Endometrial ablation    . Cardiac catheterization N/A 12/14/2014    Procedure: Right/Left Heart Cath and Coronary Angiography;  Surgeon: Sherren Mocha, MD;  Location: [facility];  Service: Cardiovascular;  Laterality: N/A;    Social History   Social History  . Marital Status: Married    Spouse Name: N/A  . Number of Children: N/A  . Years of Education: N/A   Occupational History  . Not on file.   Social History Main Topics  . Smoking status: Never Smoker   . Smokeless tobacco: Never Used  . Alcohol Use: Yes     Comment: seldom  . Drug Use: No  . Sexual Activity: Yes    Birth Control/ Protection: None   Other Topics Concern  . Not on file   Social History Narrative     Filed Vitals:   11/29/15 0914  BP: 124/62  Pulse: 78  Height: 5\' 5"  (1.651 m)  Weight: 295 lb (133.811 kg)  SpO2: 98%    PHYSICAL EXAM General: NAD HEENT: Normal. Neck: No JVD, no thyromegaly. Lungs: Clear to auscultation bilaterally with normal respiratory effort. CV: Nondisplaced PMI.  Regular rate and rhythm, normal S1/S2, no S3/S4, no murmur. No pretibial or periankle edema.   Abdomen: Soft, obese.  Neurologic: Alert and oriented.  Psych: Normal affect. Skin: Normal. Musculoskeletal: No gross deformities.  ECG: Most recent ECG reviewed.      ASSESSMENT AND PLAN: 1. Chest pain/SOB: Normal coronary arteries. I will order a 2-D echocardiogram with Doppler to evaluate cardiac structure, function, and regional wall motion.  2. Essential HTN: Controlled. No changes.  3. Hyperlipidemia: Not on statin any longer.  Dispo: fu to be determined based on echo findings.   Kate Sable, M.D., F.A.C.C.

## 2015-12-03 ENCOUNTER — Other Ambulatory Visit (HOSPITAL_COMMUNITY): Payer: BLUE CROSS/BLUE SHIELD

## 2015-12-07 ENCOUNTER — Other Ambulatory Visit (HOSPITAL_COMMUNITY): Payer: BLUE CROSS/BLUE SHIELD

## 2015-12-10 ENCOUNTER — Ambulatory Visit (HOSPITAL_COMMUNITY)
Admission: RE | Admit: 2015-12-10 | Discharge: 2015-12-10 | Disposition: A | Discharge status: Home / Self Care | Payer: BLUE CROSS/BLUE SHIELD | Source: Ambulatory Visit | Attending: Cardiovascular Disease | Admitting: Cardiovascular Disease

## 2015-12-10 DIAGNOSIS — R079 Chest pain, unspecified: Secondary | ICD-10-CM | POA: Insufficient documentation

## 2015-12-10 DIAGNOSIS — I517 Cardiomegaly: Secondary | ICD-10-CM | POA: Insufficient documentation

## 2015-12-10 DIAGNOSIS — E119 Type 2 diabetes mellitus without complications: Secondary | ICD-10-CM | POA: Insufficient documentation

## 2015-12-10 DIAGNOSIS — R0602 Shortness of breath: Secondary | ICD-10-CM | POA: Diagnosis not present

## 2015-12-10 NOTE — Progress Notes (Signed)
*  PRELIMINARY RESULTS* Echocardiogram 2D Echocardiogram has been performed.  Samuel Germany 12/10/2015, 10:55 AM

## 2015-12-13 ENCOUNTER — Telehealth: Payer: Self-pay | Admitting: *Deleted

## 2015-12-13 NOTE — Telephone Encounter (Signed)
Pt requesting echo results, done 7/14

## 2015-12-13 NOTE — Telephone Encounter (Signed)
Pt aware, routed to pcp 

## 2015-12-13 NOTE — Telephone Encounter (Signed)
-----   Message from Herminio Commons, MD sent at 12/13/2015  1:39 PM EDT ----- Normal pumping function.

## 2015-12-13 NOTE — Telephone Encounter (Signed)
Pumping function is normal. PRN fu.

## 2016-02-19 IMAGING — US US EXTREM UP*R* COMP
1 series · 13 of 25 positions shown · non-contrast
Comparison: None.

CLINICAL DATA: Right upper extremity pain and swelling 2 days.



[Series 1: us extrem up*right* comp · 0.08mm/px · 13 of 48 slices shown]
[im 1/48]
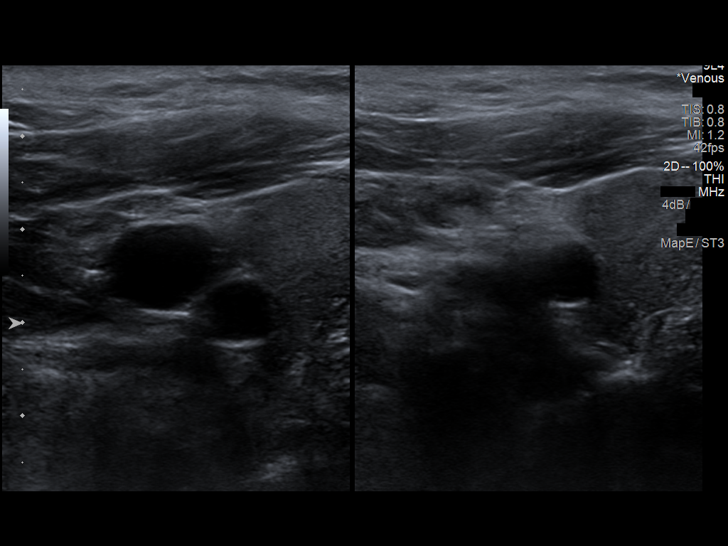
[im 4/48]
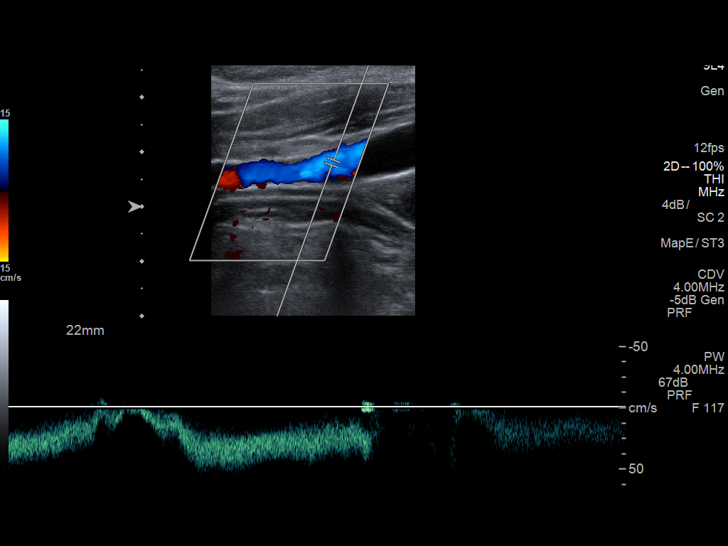
[im 8/48]
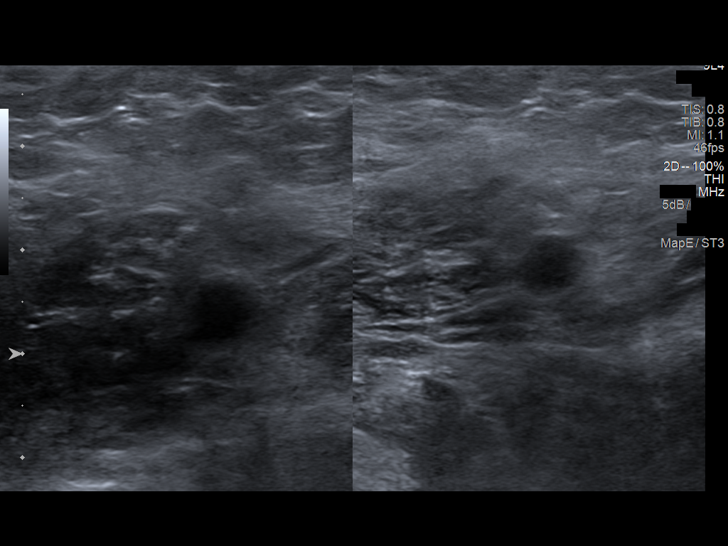
[im 12/48]
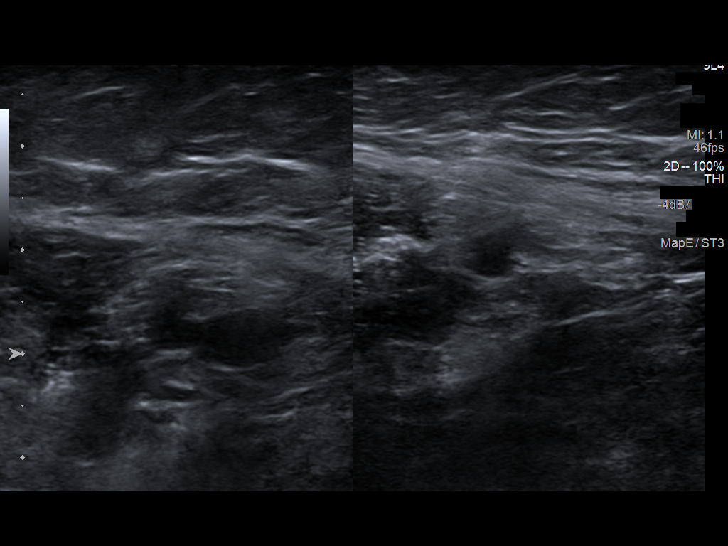
[im 16/48]
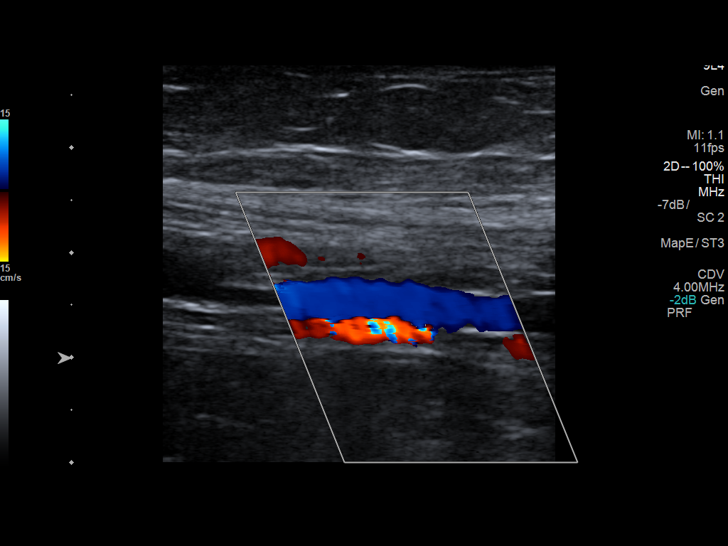
[im 20/48]
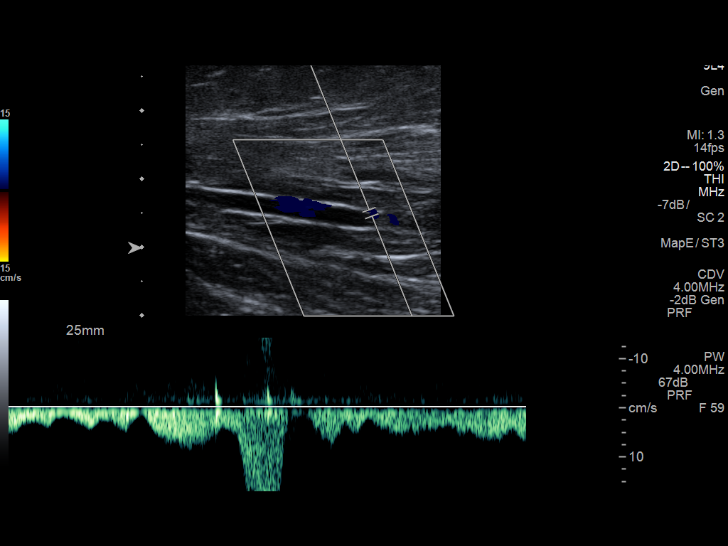
[im 24/48]
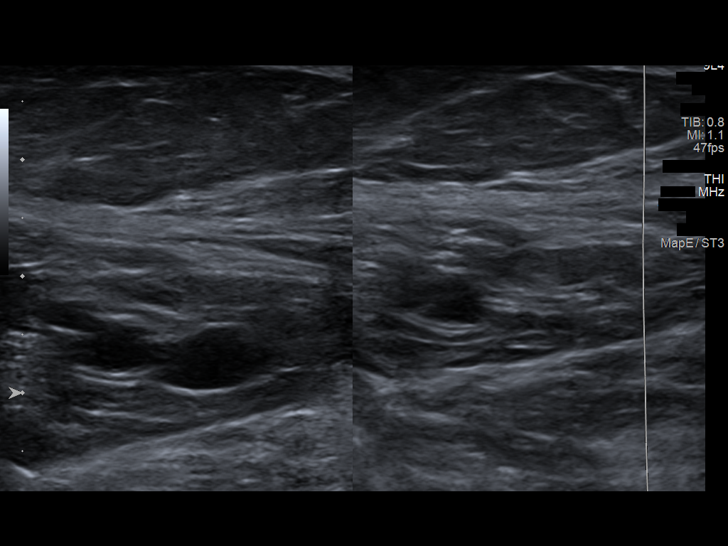
[im 28/48]
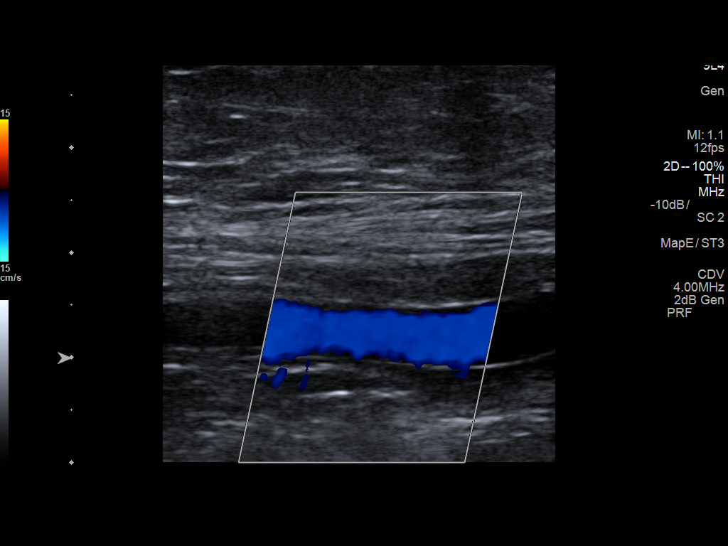
[im 32/48]
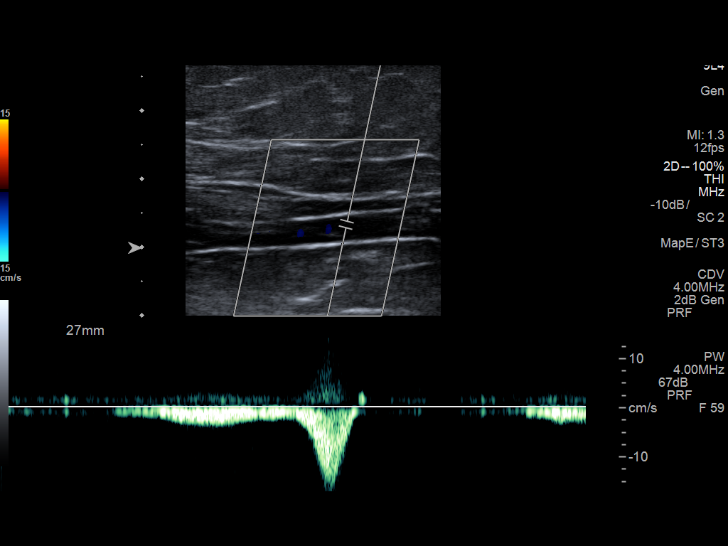
[im 36/48]
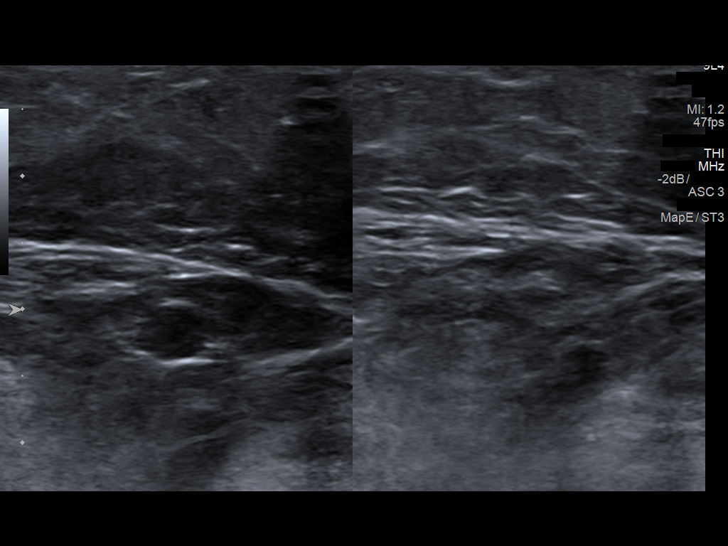
[im 40/48]
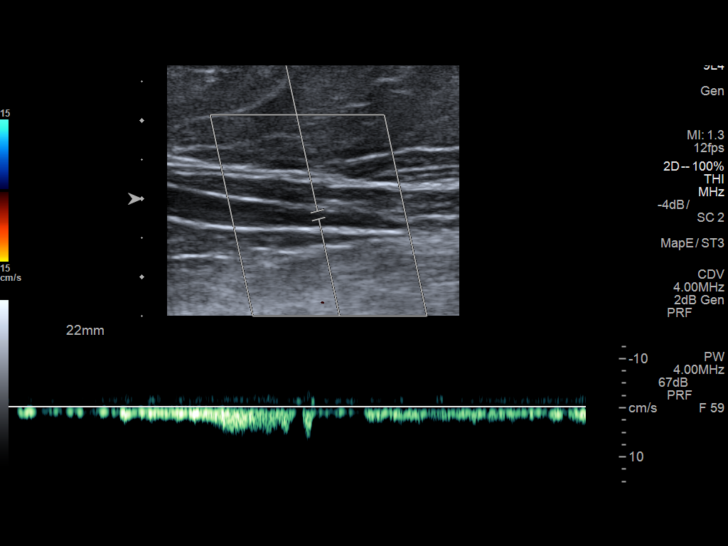
[im 44/48]
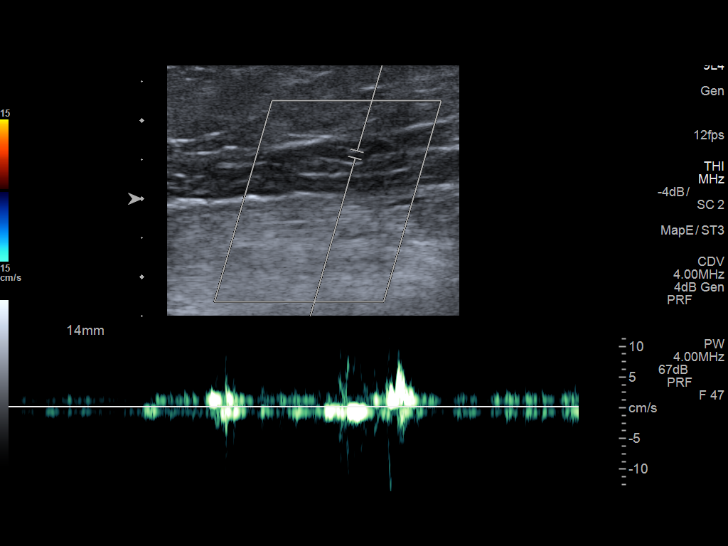
[im 48/48]
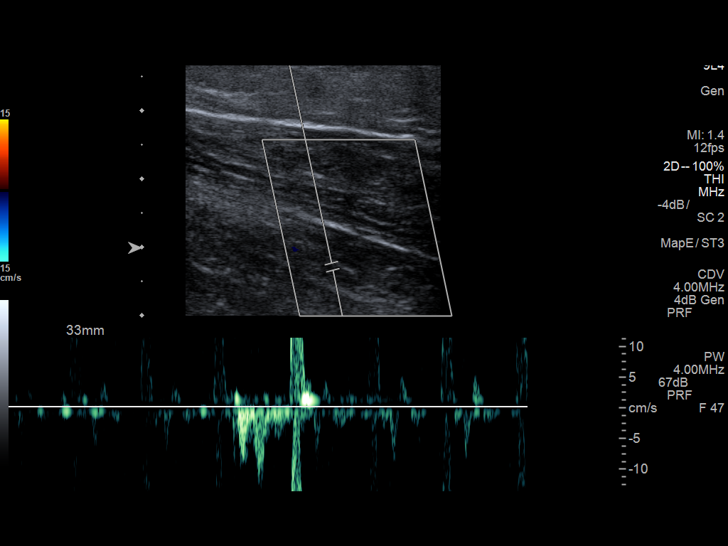

[13 of 25 positions shown; findings below may reference images not displayed]

FINDINGS: Contralateral Subclavian Vein:  Not evaluated.

Internal Jugular Vein: No evidence of thrombus. Normal
compressibility, respiratory phasicity and response to augmentation.

Subclavian Vein: No evidence of thrombus. Normal compressibility,
respiratory phasicity and response to augmentation.

Axillary Vein: No evidence of thrombus. Normal compressibility,
respiratory phasicity and response to augmentation.

Cephalic Vein: No evidence of thrombus. Normal compressibility,
respiratory phasicity and response to augmentation.

Basilic Vein: No evidence of thrombus. Normal compressibility,
respiratory phasicity and response to augmentation.

Brachial Veins: No evidence of thrombus. Normal compressibility,
respiratory phasicity and response to augmentation.

Radial Veins: No evidence of thrombus. Normal compressibility,
respiratory phasicity and response to augmentation.

Ulnar Veins: No evidence of thrombus. Normal compressibility,
respiratory phasicity and response to augmentation.

Venous Reflux:  None visualized.

Other Findings:  None visualized.
IMPRESSION: No evidence of deep venous thrombosis.

## 2016-03-01 DIAGNOSIS — E785 Hyperlipidemia, unspecified: Secondary | ICD-10-CM | POA: Diagnosis not present

## 2016-03-01 DIAGNOSIS — I10 Essential (primary) hypertension: Secondary | ICD-10-CM | POA: Diagnosis not present

## 2016-03-01 DIAGNOSIS — E1165 Type 2 diabetes mellitus with hyperglycemia: Secondary | ICD-10-CM | POA: Diagnosis not present

## 2016-03-01 DIAGNOSIS — E669 Obesity, unspecified: Secondary | ICD-10-CM | POA: Diagnosis not present

## 2016-03-02 DIAGNOSIS — J45909 Unspecified asthma, uncomplicated: Secondary | ICD-10-CM | POA: Diagnosis not present

## 2016-03-02 DIAGNOSIS — Z23 Encounter for immunization: Secondary | ICD-10-CM | POA: Diagnosis not present

## 2016-03-02 DIAGNOSIS — I10 Essential (primary) hypertension: Secondary | ICD-10-CM | POA: Diagnosis not present

## 2016-03-02 DIAGNOSIS — E669 Obesity, unspecified: Secondary | ICD-10-CM | POA: Diagnosis not present

## 2016-03-02 DIAGNOSIS — E1165 Type 2 diabetes mellitus with hyperglycemia: Secondary | ICD-10-CM | POA: Diagnosis not present

## 2016-04-06 ENCOUNTER — Telehealth (HOSPITAL_COMMUNITY): Payer: Self-pay | Admitting: *Deleted

## 2016-04-06 NOTE — Telephone Encounter (Signed)
Phone call regarding appointment.  mailbox is full.

## 2016-04-13 ENCOUNTER — Other Ambulatory Visit (HOSPITAL_COMMUNITY): Payer: Self-pay | Admitting: Respiratory Therapy

## 2016-04-13 DIAGNOSIS — I10 Essential (primary) hypertension: Secondary | ICD-10-CM | POA: Diagnosis not present

## 2016-04-13 DIAGNOSIS — J45909 Unspecified asthma, uncomplicated: Secondary | ICD-10-CM

## 2016-04-13 DIAGNOSIS — E1165 Type 2 diabetes mellitus with hyperglycemia: Secondary | ICD-10-CM | POA: Diagnosis not present

## 2016-04-24 ENCOUNTER — Telehealth (HOSPITAL_COMMUNITY): Payer: Self-pay | Admitting: *Deleted

## 2016-04-24 NOTE — Telephone Encounter (Signed)
left voice message regarding appointment. 

## 2016-05-05 ENCOUNTER — Ambulatory Visit (HOSPITAL_COMMUNITY): Admission: RE | Admit: 2016-05-05 | Payer: BLUE CROSS/BLUE SHIELD | Source: Ambulatory Visit

## 2016-05-16 ENCOUNTER — Encounter (HOSPITAL_COMMUNITY): Payer: BLUE CROSS/BLUE SHIELD

## 2016-05-23 ENCOUNTER — Encounter (HOSPITAL_COMMUNITY): Payer: BLUE CROSS/BLUE SHIELD

## 2016-05-23 ENCOUNTER — Other Ambulatory Visit: Payer: Self-pay | Admitting: Cardiovascular Disease

## 2016-06-02 ENCOUNTER — Ambulatory Visit (HOSPITAL_COMMUNITY)
Admission: RE | Admit: 2016-06-02 | Discharge: 2016-06-02 | Disposition: A | Discharge status: Home / Self Care | Payer: BLUE CROSS/BLUE SHIELD | Source: Ambulatory Visit | Attending: Pulmonary Disease | Admitting: Pulmonary Disease

## 2016-06-02 ENCOUNTER — Encounter (HOSPITAL_COMMUNITY): Payer: BLUE CROSS/BLUE SHIELD

## 2016-06-02 DIAGNOSIS — R942 Abnormal results of pulmonary function studies: Secondary | ICD-10-CM | POA: Diagnosis not present

## 2016-06-02 DIAGNOSIS — J988 Other specified respiratory disorders: Secondary | ICD-10-CM | POA: Insufficient documentation

## 2016-06-02 DIAGNOSIS — J45909 Unspecified asthma, uncomplicated: Secondary | ICD-10-CM | POA: Diagnosis not present

## 2016-06-02 LAB — PULMONARY FUNCTION TEST
DL/VA % pred: 105 %
DL/VA: 5.18 ml/min/mmHg/L
DLCO UNC: 19.12 ml/min/mmHg
DLCO unc % pred: 74 %
FEF 25-75 PRE: 1.56 L/s
FEF 25-75 Post: 1.96 L/sec
FEF2575-%Change-Post: 25 %
FEF2575-%Pred-Post: 71 %
FEF2575-%Pred-Pre: 56 %
FEV1-%Change-Post: 6 %
FEV1-%PRED-POST: 72 %
FEV1-%Pred-Pre: 68 %
FEV1-POST: 2.08 L
FEV1-Pre: 1.96 L
FEV1FVC-%Change-Post: 4 %
FEV1FVC-%PRED-PRE: 94 %
FEV6-%Change-Post: 2 %
FEV6-%PRED-POST: 74 %
FEV6-%Pred-Pre: 73 %
FEV6-POST: 2.64 L
FEV6-PRE: 2.58 L
FEV6FVC-%PRED-POST: 103 %
FEV6FVC-%PRED-PRE: 103 %
FVC-%Change-Post: 2 %
FVC-%Pred-Post: 72 %
FVC-%Pred-Pre: 71 %
FVC-Post: 2.64 L
FVC-Pre: 2.58 L
Post FEV1/FVC ratio: 79 %
Post FEV6/FVC ratio: 100 %
Pre FEV1/FVC ratio: 76 %
Pre FEV6/FVC Ratio: 100 %
RV % pred: 94 %
RV: 1.76 L
TLC % PRED: 82 %
TLC: 4.31 L

## 2016-06-02 MED ORDER — ALBUTEROL SULFATE (2.5 MG/3ML) 0.083% IN NEBU
2.5000 mg | INHALATION_SOLUTION | Freq: Once | RESPIRATORY_TRACT | Status: AC
  Administered 2016-06-02: 2.5 mg via RESPIRATORY_TRACT

## 2016-06-22 ENCOUNTER — Encounter: Payer: Self-pay | Admitting: Pulmonary Disease

## 2016-06-22 DIAGNOSIS — E1165 Type 2 diabetes mellitus with hyperglycemia: Secondary | ICD-10-CM | POA: Diagnosis not present

## 2016-06-23 DIAGNOSIS — E1165 Type 2 diabetes mellitus with hyperglycemia: Secondary | ICD-10-CM | POA: Diagnosis not present

## 2016-06-23 DIAGNOSIS — I10 Essential (primary) hypertension: Secondary | ICD-10-CM | POA: Diagnosis not present

## 2016-06-23 DIAGNOSIS — J45909 Unspecified asthma, uncomplicated: Secondary | ICD-10-CM | POA: Diagnosis not present

## 2016-07-14 ENCOUNTER — Other Ambulatory Visit (HOSPITAL_COMMUNITY): Payer: Self-pay | Admitting: General Surgery

## 2016-07-25 DIAGNOSIS — F509 Eating disorder, unspecified: Secondary | ICD-10-CM | POA: Diagnosis not present

## 2016-08-09 ENCOUNTER — Ambulatory Visit (HOSPITAL_COMMUNITY): Payer: BLUE CROSS/BLUE SHIELD

## 2016-08-18 ENCOUNTER — Encounter: Payer: Self-pay | Admitting: Cardiovascular Disease

## 2016-08-18 ENCOUNTER — Ambulatory Visit (INDEPENDENT_AMBULATORY_CARE_PROVIDER_SITE_OTHER): Payer: BLUE CROSS/BLUE SHIELD | Admitting: Cardiovascular Disease

## 2016-08-18 VITALS — BP 118/68 | HR 94 | Ht 65.0 in | Wt 292.0 lb

## 2016-08-18 DIAGNOSIS — I519 Heart disease, unspecified: Secondary | ICD-10-CM

## 2016-08-18 DIAGNOSIS — E1165 Type 2 diabetes mellitus with hyperglycemia: Secondary | ICD-10-CM | POA: Diagnosis not present

## 2016-08-18 DIAGNOSIS — E7800 Pure hypercholesterolemia, unspecified: Secondary | ICD-10-CM

## 2016-08-18 DIAGNOSIS — R0602 Shortness of breath: Secondary | ICD-10-CM

## 2016-08-18 DIAGNOSIS — R002 Palpitations: Secondary | ICD-10-CM

## 2016-08-18 DIAGNOSIS — I10 Essential (primary) hypertension: Secondary | ICD-10-CM | POA: Diagnosis not present

## 2016-08-18 NOTE — Progress Notes (Signed)
SUBJECTIVE: The patient is a 51 year old woman with a history of morbid obesity, chest tightness, shortness of breath, palpitations, hypertension, type 2 diabetes, and hyperlipidemia. Her chest tightness was deemed noncardiac in etiology as she had normal coronary arteries by cardiac catheterization. When I saw her on 03/09/15 and 11/29/15, I recommended as needed follow-up.  Echocardiogram 12/10/15 demonstrated normal left ventricular systolic function, LVEF 50-27%, grade 2 diastolic dysfunction with elevated filling pressures.  Said her diabetes is poorly controlled. Has had shortness of breath and palpitations.  Underwent ablation for SVT in 2005/2006.  She is planning to undergo gastric sleeve surgery.  Echocardiogram 08/14/14 with mild RA and RV enlargement. They were normal in size in 11/2015.  Cardiac catheterization showed normal coronaries and intracardiac pressures.   Review of Systems: As per "subjective", otherwise negative.  No Known Allergies  Current Outpatient Prescriptions  Medication Sig Dispense Refill  . acetaminophen (TYLENOL) 500 MG tablet Take 500 mg by mouth every 4 (four) hours as needed for mild pain.    Marland Kitchen atorvastatin (LIPITOR) 40 MG tablet Take 40 mg by mouth daily at 6 PM.     . furosemide (LASIX) 20 MG tablet TAKE ONE TABLET BY MOUTH ONCE DAILY AS NEEDED FOR SWELLING OF THE LEGS AND HANDS. 30 tablet 0  . hydrochlorothiazide (MICROZIDE) 12.5 MG capsule TAKE 1 TAB DAILY. TAKE EXTRA 12.5 MG DAILY AS NEEDED FOR SWELLING 90 capsule 3  . ibuprofen (ADVIL,MOTRIN) 200 MG tablet Take 400 mg by mouth daily as needed for mild pain.    Marland Kitchen lisinopril (PRINIVIL,ZESTRIL) 10 MG tablet Take 1 tablet (10 mg total) by mouth daily. 90 tablet 3  . metFORMIN (GLUCOPHAGE) 500 MG tablet Take 500 mg by mouth 2 (two) times daily with a meal.     . desvenlafaxine (PRISTIQ) 50 MG 24 hr tablet Take 50 mg by mouth daily.     Marland Kitchen PROAIR HFA 108 (90 BASE) MCG/ACT inhaler Inhale 1-2 puffs  into the lungs as needed for wheezing or shortness of breath.      No current facility-administered medications for this visit.     Past Medical History:  Diagnosis Date  . Cancer ([place])    cervical 1989  . Diabetes mellitus without complication ([place])   . Enlarged heart   . High cholesterol   . Hypertension     Past Surgical History:  Procedure Laterality Date  . CARDIAC CATHETERIZATION    . CARDIAC CATHETERIZATION N/A 12/14/2014   Procedure: Right/Left Heart Cath and Coronary Angiography;  Surgeon: Sherren Mocha, MD;  Location: [facility];  Service: Cardiovascular;  Laterality: N/A;  . CARDIAC ELECTROPHYSIOLOGY STUDY AND ABLATION    . ENDOMETRIAL ABLATION      Social History   Social History  . Marital status: Married    Spouse name: N/A  . Number of children: N/A  . Years of education: N/A   Occupational History  . Not on file.   Social History Main Topics  . Smoking status: Never Smoker  . Smokeless tobacco: Never Used  . Alcohol use Yes     Comment: seldom  . Drug use: No  . Sexual activity: Yes    Birth control/ protection: None   Other Topics Concern  . Not on file   Social History Narrative  . No narrative on file     Vitals:   08/18/16 1501  BP: 118/68  Pulse: 94  SpO2: 95%  Weight: 292 lb (132.5 kg)  Height:  5\' 5"  (1.651 m)    PHYSICAL EXAM General: NAD HEENT: Normal. Neck: No JVD, no thyromegaly. Lungs: Clear to auscultation bilaterally with normal respiratory effort. CV: Nondisplaced PMI.  Regular rate and rhythm, normal S1/S2, no S3/S4, no murmur. Trace pretibial and periankle edema b/l. Chronic venous stasis.  No carotid bruit.   Abdomen: Soft, obese.  Neurologic: Alert and oriented.  Psych: Normal affect. Skin: Chronic venous stasis of b/l legs. Musculoskeletal: No gross deformities.    ECG: Most recent ECG reviewed.      ASSESSMENT AND PLAN:  1. SOB: Normal coronary arteries and intracardiac pressures. LV  systolic function is normal as detailed above.  2. Essential HTN: Controlled. No changes.  3. Hyperlipidemia: Not on statin any longer.  4. Grade 2 diastolic dysfunction: Needs weight loss as this is causing a strain on her heart. No signs of decompensation.  5. Morbid obesity: Needs weight loss. She plans to undergo gastric sleeve surgery.  6. Palpitations: Underwent SVT ablation in 2005.    Dispo: fu prn.  Kate Sable, M.D., F.A.C.C.

## 2016-08-18 NOTE — Patient Instructions (Signed)
Your physician recommends that you schedule a follow-up appointment in: as needed   Thank you for choosing [facility] !         

## 2016-08-21 ENCOUNTER — Emergency Department (HOSPITAL_COMMUNITY): Payer: BLUE CROSS/BLUE SHIELD

## 2016-08-21 ENCOUNTER — Encounter (HOSPITAL_COMMUNITY): Payer: Self-pay | Admitting: Emergency Medicine

## 2016-08-21 ENCOUNTER — Emergency Department (HOSPITAL_COMMUNITY)
Admission: EM | Admit: 2016-08-21 | Discharge: 2016-08-21 | Disposition: A | Discharge status: Home / Self Care | Payer: BLUE CROSS/BLUE SHIELD | Attending: Emergency Medicine | Admitting: Emergency Medicine

## 2016-08-21 DIAGNOSIS — M79605 Pain in left leg: Secondary | ICD-10-CM

## 2016-08-21 DIAGNOSIS — Z791 Long term (current) use of non-steroidal anti-inflammatories (NSAID): Secondary | ICD-10-CM | POA: Diagnosis not present

## 2016-08-21 DIAGNOSIS — Y939 Activity, unspecified: Secondary | ICD-10-CM | POA: Diagnosis not present

## 2016-08-21 DIAGNOSIS — Y92009 Unspecified place in unspecified non-institutional (private) residence as the place of occurrence of the external cause: Secondary | ICD-10-CM | POA: Insufficient documentation

## 2016-08-21 DIAGNOSIS — I10 Essential (primary) hypertension: Secondary | ICD-10-CM | POA: Diagnosis not present

## 2016-08-21 DIAGNOSIS — E119 Type 2 diabetes mellitus without complications: Secondary | ICD-10-CM | POA: Insufficient documentation

## 2016-08-21 DIAGNOSIS — Z79899 Other long term (current) drug therapy: Secondary | ICD-10-CM | POA: Diagnosis not present

## 2016-08-21 DIAGNOSIS — M25559 Pain in unspecified hip: Secondary | ICD-10-CM | POA: Diagnosis not present

## 2016-08-21 DIAGNOSIS — Z7984 Long term (current) use of oral hypoglycemic drugs: Secondary | ICD-10-CM | POA: Insufficient documentation

## 2016-08-21 DIAGNOSIS — S8992XA Unspecified injury of left lower leg, initial encounter: Secondary | ICD-10-CM | POA: Diagnosis present

## 2016-08-21 DIAGNOSIS — W1839XA Other fall on same level, initial encounter: Secondary | ICD-10-CM | POA: Diagnosis not present

## 2016-08-21 DIAGNOSIS — Y999 Unspecified external cause status: Secondary | ICD-10-CM | POA: Diagnosis not present

## 2016-08-21 DIAGNOSIS — M25462 Effusion, left knee: Secondary | ICD-10-CM | POA: Diagnosis not present

## 2016-08-21 DIAGNOSIS — Z8541 Personal history of malignant neoplasm of cervix uteri: Secondary | ICD-10-CM | POA: Diagnosis not present

## 2016-08-21 DIAGNOSIS — M25562 Pain in left knee: Secondary | ICD-10-CM | POA: Diagnosis not present

## 2016-08-21 DIAGNOSIS — M79662 Pain in left lower leg: Secondary | ICD-10-CM | POA: Diagnosis not present

## 2016-08-21 DIAGNOSIS — W19XXXA Unspecified fall, initial encounter: Secondary | ICD-10-CM

## 2016-08-21 DIAGNOSIS — R0781 Pleurodynia: Secondary | ICD-10-CM | POA: Diagnosis not present

## 2016-08-21 HISTORY — DX: Tachycardia, unspecified: R00.0

## 2016-08-21 LAB — CBC WITH DIFFERENTIAL/PLATELET
Basophils Absolute: 0 10*3/uL (ref 0.0–0.1)
Basophils Relative: 0 %
Eosinophils Absolute: 0.2 10*3/uL (ref 0.0–0.7)
Eosinophils Relative: 2 %
HEMATOCRIT: 42.6 % (ref 36.0–46.0)
HEMOGLOBIN: 14.6 g/dL (ref 12.0–15.0)
LYMPHS PCT: 23 %
Lymphs Abs: 2.6 10*3/uL (ref 0.7–4.0)
MCH: 29.9 pg (ref 26.0–34.0)
MCHC: 34.3 g/dL (ref 30.0–36.0)
MCV: 87.1 fL (ref 78.0–100.0)
MONO ABS: 0.8 10*3/uL (ref 0.1–1.0)
MONOS PCT: 7 %
Neutro Abs: 7.4 10*3/uL (ref 1.7–7.7)
Neutrophils Relative %: 68 %
Platelets: 203 10*3/uL (ref 150–400)
RBC: 4.89 MIL/uL (ref 3.87–5.11)
RDW: 13.5 % (ref 11.5–15.5)
WBC: 10.9 10*3/uL — ABNORMAL HIGH (ref 4.0–10.5)

## 2016-08-21 LAB — BASIC METABOLIC PANEL
Anion gap: 9 (ref 5–15)
BUN: 15 mg/dL (ref 6–20)
CHLORIDE: 101 mmol/L (ref 101–111)
CO2: 25 mmol/L (ref 22–32)
CREATININE: 0.58 mg/dL (ref 0.44–1.00)
Calcium: 9 mg/dL (ref 8.9–10.3)
GFR calc Af Amer: >60 mL/min (ref >60)
GFR calc non Af Amer: >60 mL/min (ref >60)
GLUCOSE: 242 mg/dL — AB (ref 65–99)
Potassium: 4 mmol/L (ref 3.5–5.1)
Sodium: 135 mmol/L (ref 135–145)

## 2016-08-21 LAB — CBG MONITORING, ED: GLUCOSE-CAPILLARY: 244 mg/dL — AB (ref 65–99)

## 2016-08-21 MED ORDER — CEPHALEXIN 500 MG PO CAPS
500.0000 mg | ORAL_CAPSULE | Freq: Once | ORAL | Status: AC
  Administered 2016-08-21: 500 mg via ORAL
  Filled 2016-08-21: qty 1

## 2016-08-21 MED ORDER — HYDROCODONE-ACETAMINOPHEN 5-325 MG PO TABS: 1.0000 | ORAL_TABLET | ORAL | 0 refills | Status: DC | PRN

## 2016-08-21 MED ORDER — LIDOCAINE HCL (PF) 1 % IJ SOLN
10.0000 mL | Freq: Once | INTRAMUSCULAR | Status: AC
  Administered 2016-08-21: 10 mL
  Filled 2016-08-21: qty 10

## 2016-08-21 MED ORDER — HYDROCODONE-ACETAMINOPHEN 5-325 MG PO TABS
1.0000 | ORAL_TABLET | Freq: Once | ORAL | Status: AC
  Administered 2016-08-21: 1 via ORAL
  Filled 2016-08-21: qty 1

## 2016-08-21 MED ORDER — METHYLPREDNISOLONE ACETATE 80 MG/ML IJ SUSP
80.0000 mg | Freq: Once | INTRAMUSCULAR | Status: AC
  Administered 2016-08-21: 80 mg via INTRA_ARTICULAR
  Filled 2016-08-21: qty 1

## 2016-08-21 MED ORDER — CEPHALEXIN 500 MG PO CAPS: ORAL_CAPSULE | ORAL | 0 refills | Status: DC

## 2016-08-21 NOTE — ED Notes (Signed)
Dr. Dayna Barker in to perform Left knee tap

## 2016-08-21 NOTE — ED Provider Notes (Signed)
[facility] DEPT Provider Note   CSN: [id number] Arrival date & time: 08/21/16  1050     History   Chief Complaint Chief Complaint  Patient presents with  . Leg Pain  . Fall    HPI Christina Cameron is a 51 y.o. female.   Leg Pain   This is a new problem. The current episode started less than 1 hour ago. The problem occurs constantly. The problem has not changed since onset.Associated symptoms include stiffness. She has tried nothing for the symptoms. The treatment provided no relief.  Fall     Past Medical History:  Diagnosis Date  . Cancer ([place])    cervical 1989  . Diabetes mellitus without complication ([place])   . Enlarged heart   . High cholesterol   . Hypertension   . Tachycardia     Patient Active Problem List   Diagnosis Date Noted  . DOE (dyspnea on exertion)   . Chest pain 09/08/2014  . Heart palpitations 08/04/2014  . DM (diabetes mellitus) ([place]) 08/04/2014    Past Surgical History:  Procedure Laterality Date  . CARDIAC CATHETERIZATION    . CARDIAC CATHETERIZATION N/A 12/14/2014   Procedure: Right/Left Heart Cath and Coronary Angiography;  Surgeon: Sherren Mocha, MD;  Location: [facility];  Service: Cardiovascular;  Laterality: N/A;  . CARDIAC ELECTROPHYSIOLOGY STUDY AND ABLATION    . ENDOMETRIAL ABLATION      OB History    Gravida Para Term Preterm AB Living   3 2 2   1 2    SAB TAB Ectopic Multiple Live Births   1               Home Medications    Prior to Admission medications   Medication Sig Start Date End Date Taking? Authorizing Provider  acetaminophen (TYLENOL) 500 MG tablet Take 500 mg by mouth every 4 (four) hours as needed for mild pain.   Yes Historical Provider, MD  atorvastatin (LIPITOR) 40 MG tablet Take 40 mg by mouth daily at 6 PM.  08/01/16  Yes Historical Provider, MD  desvenlafaxine (PRISTIQ) 50 MG 24 hr tablet Take 50 mg by mouth daily.  07/12/16  Yes Historical Provider, MD  furosemide (LASIX) 20 MG tablet TAKE ONE  TABLET BY MOUTH ONCE DAILY AS NEEDED FOR SWELLING OF THE LEGS AND HANDS. 05/23/16  Yes Herminio Commons, MD  hydrochlorothiazide (MICROZIDE) 12.5 MG capsule TAKE 1 TAB DAILY. TAKE EXTRA 12.5 MG DAILY AS NEEDED FOR SWELLING 10/29/14  Yes Herminio Commons, MD  ibuprofen (ADVIL,MOTRIN) 200 MG tablet Take 400 mg by mouth daily as needed for mild pain.   Yes Historical Provider, MD  lisinopril (PRINIVIL,ZESTRIL) 10 MG tablet Take 1 tablet (10 mg total) by mouth daily. 10/29/14  Yes Herminio Commons, MD  metFORMIN (GLUCOPHAGE) 500 MG tablet Take 500 mg by mouth 2 (two) times daily with a meal.  07/31/16  Yes Historical Provider, MD  PROAIR HFA 108 (90 BASE) MCG/ACT inhaler Inhale 1-2 puffs into the lungs as needed for wheezing or shortness of breath.  02/13/15  Yes Historical Provider, MD  cephALEXin (KEFLEX) 500 MG capsule 2 caps po bid x 7 days 08/21/16   Merrily Pew, MD  HYDROcodone-acetaminophen (NORCO/VICODIN) 5-325 MG tablet Take 1-2 tablets by mouth every 4 (four) hours as needed. 08/21/16   Merrily Pew, MD    Family History Family History  Problem Relation Age of Onset  . Cancer Mother   . Cancer Father   .  Stroke Brother   . Hypertension Brother     Social History Social History  Substance Use Topics  . Smoking status: Never Smoker  . Smokeless tobacco: Never Used  . Alcohol use Yes     Comment: seldom     Allergies   Patient has no known allergies.   Review of Systems Review of Systems  Musculoskeletal: Positive for stiffness.       Left leg paina nd swelling for a few days  All other systems reviewed and are negative.    Physical Exam Updated Vital Signs BP 106/68 (BP Location: Right Arm)   Pulse 64   Temp 98.2 F (36.8 C) (Oral)   Resp 16   Ht 5\' 5"  (1.651 m)   Wt 292 lb (132.5 kg)   SpO2 95%   BMI 48.59 kg/m   Physical Exam  Constitutional: She appears well-developed and well-nourished.  HENT:  Head: Normocephalic and atraumatic.  Eyes: Conjunctivae  and EOM are normal.  Neck: Normal range of motion.  Cardiovascular: Normal rate and regular rhythm.   Pulmonary/Chest: Effort normal. No stridor. No respiratory distress.  Abdominal: Soft. She exhibits no distension.  Musculoskeletal: Normal range of motion. She exhibits tenderness (left lateral knee with mild swelling of rest of knee). She exhibits no deformity.  Neurological: She is alert.  Skin: Skin is warm and dry.  Nursing note and vitals reviewed.    ED Treatments / Results  Labs (all labs ordered are listed, but only abnormal results are displayed) Labs Reviewed  CBC WITH DIFFERENTIAL/PLATELET - Abnormal; Notable for the following:       Result Value   WBC 10.9 (*)    All other components within normal limits  BASIC METABOLIC PANEL - Abnormal; Notable for the following:    Glucose, Bld 242 (*)    All other components within normal limits  CBG MONITORING, ED - Abnormal; Notable for the following:    Glucose-Capillary 244 (*)    All other components within normal limits    EKG  EKG Interpretation None       Radiology Dg Ribs Unilateral W/chest Right  Result Date: 08/21/2016 CLINICAL DATA:  Pain following fall EXAM: RIGHT RIBS AND CHEST - 3+ VIEW COMPARISON:  Chest radiograph February 05, 2015 FINDINGS: Frontal chest as well as oblique and cone-down lateral rib images obtained. There is no edema or consolidation. Heart size and pulmonary vascularity are normal. No adenopathy. There is no pneumothorax for pleural effusion. No rib fracture evident. IMPRESSION: No evident rib fracture. No pneumothorax. No edema or consolidation. Electronically Signed   By: Lowella Grip III M.D.   On: 08/21/2016 11:50   Dg Pelvis 1-2 Views  Result Date: 08/21/2016 CLINICAL DATA:  Pain . EXAM: PELVIS - 1-2 VIEW COMPARISON:  No recent . FINDINGS: No acute bony abnormality identified. No evidence of fracture. Mild degenerative change. Tiny sclerotic focus left proximal femur most likely  bone island. Coarse small cortical calcification in the left upper pelvis most likely within a fibroid. Remaining calcifications in the pelvis are tiny and most likely phleboliths. IMPRESSION: No acute abnormality identified.  Probable small calcified fibroid. Electronically Signed   By: Marcello Moores  Register   On: 08/21/2016 13:04   US Venous Img Lower Unilateral Left  Result Date: 08/21/2016 CLINICAL DATA:  Concern for deep venous thrombosis. LEFT lower extremity pain with no injury. Pain behind knee EXAM: LEFT LOWER EXTREMITY VENOUS DOPPLER ULTRASOUND TECHNIQUE: Gray-scale sonography with graded compression, as well as color Doppler  and duplex ultrasound were performed to evaluate the lower extremity deep venous systems from the level of the common femoral vein and including the common femoral, femoral, profunda femoral, popliteal and calf veins including the posterior tibial, peroneal and gastrocnemius veins when visible. The superficial great saphenous vein was also interrogated. Spectral Doppler was utilized to evaluate flow at rest and with distal augmentation maneuvers in the common femoral, femoral and popliteal veins. COMPARISON:  None. FINDINGS: Contralateral Common Femoral Vein: Respiratory phasicity is normal and symmetric with the symptomatic side. No evidence of thrombus. Normal compressibility. Common Femoral Vein: No evidence of thrombus. Normal compressibility, respiratory phasicity and response to augmentation. Saphenofemoral Junction: No evidence of thrombus. Normal compressibility and flow on color Doppler imaging. Profunda Femoral Vein: No evidence of thrombus. Normal compressibility and flow on color Doppler imaging. Femoral Vein: No evidence of thrombus. Normal compressibility, respiratory phasicity and response to augmentation. Popliteal Vein: No evidence of thrombus. Normal compressibility, respiratory phasicity and response to augmentation. Calf Veins: No evidence of thrombus. Normal  compressibility and flow on color Doppler imaging. Superficial Great Saphenous Vein: No evidence of thrombus. Normal compressibility and flow on color Doppler imaging. IMPRESSION: No evidence of LEFT lower extremity deep venous vein thrombosis. Electronically Signed   By: Suzy Bouchard M.D.   On: 08/21/2016 13:20   Dg Knee Complete 4 Views Left  Result Date: 08/21/2016 CLINICAL DATA:  Two day history of posterior knee pain. Knee giving way. EXAM: LEFT KNEE - COMPLETE 4+ VIEW COMPARISON:  None FINDINGS: Moderate tricompartmental degenerative changes with joint space narrowing and osteophytic spurring most significant in the medial compartment. No acute fracture or osteochondral lesion. There is a moderate-sized joint effusion. IMPRESSION: Moderate tricompartmental degenerative changes most notable in the medial compartment. No acute bony findings. Moderate-sized joint effusion. Electronically Signed   By: Marijo Sanes M.D.   On: 08/21/2016 13:07    Procedures .Joint Aspiration/Arthrocentesis Date/Time: 08/21/2016 9:39 PM Performed by: Merrily Pew Authorized by: Merrily Pew   Consent:    Consent obtained:  Verbal   Consent given by:  Patient   Risks discussed:  Bleeding and infection   Alternatives discussed:  No treatment Location:    Location:  Knee   Knee:  L knee Anesthesia (see MAR for exact dosages):    Anesthesia method:  Local infiltration   Local anesthetic:  Lidocaine 1% w/o epi Procedure details:    Preparation: Patient was prepped and draped in usual sterile fashion     Needle gauge:  13 G   Ultrasound guidance: no     Approach:  Inferior   Aspirate amount:  0   Specimen collected: no   Post-procedure details:    Dressing:  Adhesive bandage   Patient tolerance of procedure:  Procedure terminated at patient's request   (including critical care time)  Medications Ordered in ED Medications  HYDROcodone-acetaminophen (NORCO/VICODIN) 5-325 MG per tablet 1 tablet (1  tablet Oral Given 08/21/16 1314)  lidocaine (PF) (XYLOCAINE) 1 % injection 10 mL (10 mLs Other Given by Other 08/21/16 1444)  methylPREDNISolone acetate (DEPO-MEDROL) injection 80 mg (80 mg Intra-articular Given 08/21/16 1444)  cephALEXin (KEFLEX) capsule 500 mg (500 mg Oral Given 08/21/16 1615)     Initial Impression / Assessment and Plan / ED Course  I have reviewed the triage vital signs and the nursing notes.  Pertinent labs & imaging results that were available during my care of the patient were reviewed by me and considered in my medical decision making (see  chart for details).      dvt v bakers cyst v tendinopathy.  Possibly tendinopathy vs cellulitis. Attempted arthrocentesis to see if it would Help with symptoms and possibly diagnose if it was a hemarthrosis. Patient did not tolerate procedure well so that was aborted. We'll just treat for cellulitis in her follow-up with her primary doctor if not improving in a few days.  Final Clinical Impressions(s) / ED Diagnoses   Final diagnoses:  Left leg pain  Fall, initial encounter    New Prescriptions Discharge Medication List as of 08/21/2016  3:08 PM    START taking these medications   Details  cephALEXin (KEFLEX) 500 MG capsule 2 caps po bid x 7 days, Print         Merrily Pew, MD 08/21/16 2140

## 2016-08-21 NOTE — ED Triage Notes (Signed)
PT states her left lower leg has been hurting behind the knee and down into her calf x2 days. PT states when she was getting ready to come to the ED today her left leg "gave out" and she fell into her dresser at home causing right rib cage pain.

## 2016-08-21 NOTE — ED Notes (Signed)
Pt taken to xray 

## 2016-08-21 NOTE — ED Notes (Signed)
Patyient fitted with knee immobilizer and crutches with instructions.

## 2016-08-25 ENCOUNTER — Other Ambulatory Visit (HOSPITAL_COMMUNITY): Payer: BLUE CROSS/BLUE SHIELD

## 2016-09-08 ENCOUNTER — Other Ambulatory Visit (HOSPITAL_COMMUNITY): Payer: BLUE CROSS/BLUE SHIELD

## 2016-09-08 ENCOUNTER — Ambulatory Visit (HOSPITAL_COMMUNITY)
Admission: RE | Admit: 2016-09-08 | Discharge: 2016-09-08 | Disposition: A | Discharge status: Home / Self Care | Payer: BLUE CROSS/BLUE SHIELD | Source: Ambulatory Visit | Attending: General Surgery | Admitting: General Surgery

## 2016-09-13 ENCOUNTER — Ambulatory Visit: Payer: BLUE CROSS/BLUE SHIELD | Admitting: Skilled Nursing Facility1

## 2016-09-22 ENCOUNTER — Encounter (HOSPITAL_COMMUNITY): Payer: Self-pay

## 2016-09-22 ENCOUNTER — Ambulatory Visit (HOSPITAL_COMMUNITY)
Admission: RE | Admit: 2016-09-22 | Discharge: 2016-09-22 | Disposition: A | Discharge status: Home / Self Care | Payer: BLUE CROSS/BLUE SHIELD | Source: Ambulatory Visit | Attending: General Surgery | Admitting: General Surgery

## 2016-09-22 ENCOUNTER — Ambulatory Visit (HOSPITAL_COMMUNITY): Payer: BLUE CROSS/BLUE SHIELD

## 2016-10-13 ENCOUNTER — Ambulatory Visit: Payer: BLUE CROSS/BLUE SHIELD | Admitting: Registered"

## 2016-10-20 ENCOUNTER — Other Ambulatory Visit (HOSPITAL_COMMUNITY): Payer: BLUE CROSS/BLUE SHIELD

## 2016-10-20 ENCOUNTER — Ambulatory Visit (HOSPITAL_COMMUNITY): Admission: RE | Admit: 2016-10-20 | Payer: BLUE CROSS/BLUE SHIELD | Source: Ambulatory Visit

## 2017-04-30 ENCOUNTER — Encounter: Payer: Self-pay | Admitting: Family Medicine

## 2017-09-21 DIAGNOSIS — Z124 Encounter for screening for malignant neoplasm of cervix: Secondary | ICD-10-CM | POA: Diagnosis not present

## 2017-09-21 DIAGNOSIS — E119 Type 2 diabetes mellitus without complications: Secondary | ICD-10-CM | POA: Insufficient documentation

## 2017-09-21 DIAGNOSIS — Z01419 Encounter for gynecological examination (general) (routine) without abnormal findings: Secondary | ICD-10-CM | POA: Diagnosis not present

## 2017-09-21 DIAGNOSIS — Z1231 Encounter for screening mammogram for malignant neoplasm of breast: Secondary | ICD-10-CM | POA: Diagnosis not present

## 2017-09-21 DIAGNOSIS — I10 Essential (primary) hypertension: Secondary | ICD-10-CM | POA: Insufficient documentation

## 2017-10-15 ENCOUNTER — Ambulatory Visit (HOSPITAL_COMMUNITY)
Admission: EM | Admit: 2017-10-15 | Discharge: 2017-10-15 | Disposition: A | Discharge status: Home / Self Care | Payer: 59 | Attending: Family Medicine | Admitting: Family Medicine

## 2017-10-15 ENCOUNTER — Encounter (HOSPITAL_COMMUNITY): Payer: Self-pay | Admitting: Emergency Medicine

## 2017-10-15 DIAGNOSIS — R51 Headache: Secondary | ICD-10-CM | POA: Diagnosis not present

## 2017-10-15 DIAGNOSIS — E785 Hyperlipidemia, unspecified: Secondary | ICD-10-CM | POA: Diagnosis not present

## 2017-10-15 DIAGNOSIS — I2510 Atherosclerotic heart disease of native coronary artery without angina pectoris: Secondary | ICD-10-CM | POA: Insufficient documentation

## 2017-10-15 DIAGNOSIS — H66003 Acute suppurative otitis media without spontaneous rupture of ear drum, bilateral: Secondary | ICD-10-CM | POA: Diagnosis not present

## 2017-10-15 DIAGNOSIS — E7800 Pure hypercholesterolemia, unspecified: Secondary | ICD-10-CM | POA: Insufficient documentation

## 2017-10-15 DIAGNOSIS — I10 Essential (primary) hypertension: Secondary | ICD-10-CM | POA: Insufficient documentation

## 2017-10-15 DIAGNOSIS — R42 Dizziness and giddiness: Secondary | ICD-10-CM | POA: Insufficient documentation

## 2017-10-15 DIAGNOSIS — Z7984 Long term (current) use of oral hypoglycemic drugs: Secondary | ICD-10-CM | POA: Insufficient documentation

## 2017-10-15 DIAGNOSIS — R059 Cough, unspecified: Secondary | ICD-10-CM

## 2017-10-15 DIAGNOSIS — E1165 Type 2 diabetes mellitus with hyperglycemia: Secondary | ICD-10-CM

## 2017-10-15 DIAGNOSIS — J029 Acute pharyngitis, unspecified: Secondary | ICD-10-CM | POA: Insufficient documentation

## 2017-10-15 DIAGNOSIS — Z8249 Family history of ischemic heart disease and other diseases of the circulatory system: Secondary | ICD-10-CM | POA: Insufficient documentation

## 2017-10-15 DIAGNOSIS — R52 Pain, unspecified: Secondary | ICD-10-CM | POA: Insufficient documentation

## 2017-10-15 DIAGNOSIS — R079 Chest pain, unspecified: Secondary | ICD-10-CM | POA: Insufficient documentation

## 2017-10-15 DIAGNOSIS — R05 Cough: Secondary | ICD-10-CM | POA: Diagnosis not present

## 2017-10-15 DIAGNOSIS — Z79899 Other long term (current) drug therapy: Secondary | ICD-10-CM | POA: Diagnosis not present

## 2017-10-15 LAB — POCT RAPID STREP A: STREPTOCOCCUS, GROUP A SCREEN (DIRECT): NEGATIVE

## 2017-10-15 MED ORDER — METFORMIN HCL 500 MG PO TABS: 500.0000 mg | ORAL_TABLET | Freq: Two times a day (BID) | ORAL | 1 refills | Status: DC

## 2017-10-15 MED ORDER — HYDROCOD POLST-CPM POLST ER 10-8 MG/5ML PO SUER: 5.0000 mL | Freq: Every evening | ORAL | 0 refills | Status: DC | PRN

## 2017-10-15 MED ORDER — AMOXICILLIN-POT CLAVULANATE 875-125 MG PO TABS: 1.0000 | ORAL_TABLET | Freq: Two times a day (BID) | ORAL | 0 refills | Status: AC

## 2017-10-15 MED ORDER — ALBUTEROL SULFATE HFA 108 (90 BASE) MCG/ACT IN AERS: 2.0000 | INHALATION_SPRAY | Freq: Four times a day (QID) | RESPIRATORY_TRACT | 0 refills | Status: DC | PRN

## 2017-10-15 NOTE — Discharge Instructions (Addendum)
Recommend start Augmentin 875mg  twice a day as directed. May continue OTC Mucinex DM as directed for cough. May use Albuterol inhaler 2 puffs every 6 hours as needed for wheezing or cough. May take Tussionex cough syrup 1 teaspoon at night as needed. Restart Glucophage 500mg  twice a day as directed. Recommend follow-up here in 3 to 4 days if not improving. Also schedule appointment with a PCP for Diabetes and HTN management.

## 2017-10-15 NOTE — ED Provider Notes (Signed)
[facility]    CSN: [id number] Arrival date & time: 10/15/17  1141     History   Chief Complaint Chief Complaint  Patient presents with  . Cough  . Generalized Body Aches    HPI Christina Cameron is a 52 y.o. female.   52 year old female presents with sore throat, chills, cough, chest congestion and headache for the past 4 to 5 days. Symptoms are getting worse with more throat swelling, ear pressure and dizziness. She has also vomited due to coughing but no longer nauseous. Denies any fever or diarrhea. No known exposure to strep but husband has probable bronchitis. Has taken Mucinex DM with minimal relief. Ran out of Albuterol. Unable to sleep due to cough. Has history of HTN, CAD, Diabetes type 2 and hyperlipidemia. Has been unable to see her PCP due to financial issues and has run out of her Glucophage. Sugar levels today at home were 312. Blood pressure also elevated today.   The history is provided by the patient.    Past Medical History:  Diagnosis Date  . Cancer ([place])    cervical 1989  . Diabetes mellitus without complication ([place])   . Enlarged heart   . High cholesterol   . Hypertension   . Tachycardia     Patient Active Problem List   Diagnosis Date Noted  . DOE (dyspnea on exertion)   . Chest pain 09/08/2014  . Heart palpitations 08/04/2014  . DM (diabetes mellitus) ([place]) 08/04/2014    Past Surgical History:  Procedure Laterality Date  . CARDIAC CATHETERIZATION    . CARDIAC CATHETERIZATION N/A 12/14/2014   Procedure: Right/Left Heart Cath and Coronary Angiography;  Surgeon: Sherren Mocha, MD;  Location: [facility];  Service: Cardiovascular;  Laterality: N/A;  . CARDIAC ELECTROPHYSIOLOGY STUDY AND ABLATION    . ENDOMETRIAL ABLATION      OB History    Gravida  3   Para  2   Term  2   Preterm      AB  1   Living  2     SAB  1   TAB      Ectopic      Multiple      Live Births               Home Medications     Prior to Admission medications   Medication Sig Start Date End Date Taking? Authorizing Provider  acetaminophen (TYLENOL) 500 MG tablet Take 500 mg by mouth every 4 (four) hours as needed for mild pain.    [provider]  albuterol (PROVENTIL HFA;VENTOLIN HFA) 108 (90 Base) MCG/ACT inhaler Inhale 2 puffs into the lungs every 6 (six) hours as needed for wheezing or shortness of breath. 10/15/17   Janny Crute, Nicholes Stairs, NP  amoxicillin-clavulanate (AUGMENTIN) 875-125 MG tablet Take 1 tablet by mouth every 12 (twelve) hours for 7 days. 10/15/17 10/22/17  Katy Apo, NP  atorvastatin (LIPITOR) 40 MG tablet Take 40 mg by mouth daily at 6 PM.  08/01/16   [provider]  chlorpheniramine-HYDROcodone (TUSSIONEX PENNKINETIC ER) 10-8 MG/5ML SUER Take 5 mLs by mouth at bedtime as needed for cough. 10/15/17   Katy Apo, NP  furosemide (LASIX) 20 MG tablet TAKE ONE TABLET BY MOUTH ONCE DAILY AS NEEDED FOR SWELLING OF THE LEGS AND HANDS. 05/23/16   Herminio Commons, MD  hydrochlorothiazide (MICROZIDE) 12.5 MG capsule TAKE 1 TAB DAILY. TAKE EXTRA 12.5 MG DAILY AS  NEEDED FOR SWELLING 10/29/14   Herminio Commons, MD  ibuprofen (ADVIL,MOTRIN) 200 MG tablet Take 400 mg by mouth daily as needed for mild pain.    [provider]  lisinopril (PRINIVIL,ZESTRIL) 10 MG tablet Take 1 tablet (10 mg total) by mouth daily. 10/29/14   Herminio Commons, MD  metFORMIN (GLUCOPHAGE) 500 MG tablet Take 1 tablet (500 mg total) by mouth 2 (two) times daily with a meal. 10/15/17   Naithen Rivenburg, Nicholes Stairs, NP    Family History Family History  Problem Relation Age of Onset  . Cancer Mother   . Cancer Father   . Stroke Brother   . Hypertension Brother     Social History Social History   Tobacco Use  . Smoking status: Never Smoker  . Smokeless tobacco: Never Used  Substance Use Topics  . Alcohol use: Yes    Comment: seldom  . Drug use: No     Allergies   Patient has no known  allergies.   Review of Systems Review of Systems  Constitutional: Positive for activity change, appetite change, chills and fatigue. Negative for diaphoresis and fever.  HENT: Positive for congestion, ear pain, postnasal drip, rhinorrhea, sinus pressure, sinus pain, sore throat and trouble swallowing. Negative for ear discharge, facial swelling, mouth sores, nosebleeds and sneezing.   Eyes: Positive for redness. Negative for photophobia, pain, discharge, itching and visual disturbance.  Respiratory: Positive for cough, chest tightness and wheezing. Negative for shortness of breath and stridor.   Cardiovascular: Negative for chest pain and palpitations.  Gastrointestinal: Positive for vomiting. Negative for abdominal pain, diarrhea and nausea.  Musculoskeletal: Positive for arthralgias. Negative for neck pain and neck stiffness.  Skin: Negative for color change, rash and wound.  Neurological: Positive for dizziness, light-headedness and headaches. Negative for tremors, seizures, syncope, weakness and numbness.  Hematological: Negative for adenopathy. Does not bruise/bleed easily.  Psychiatric/Behavioral: Negative.      Physical Exam Triage Vital Signs ED Triage Vitals [10/15/17 1245]  Enc Vitals Group     BP (!) 169/82     Pulse Rate 92     Resp 18     Temp 98.6 F (37 C)     Temp Source Oral     SpO2 98 %     Weight      Height      Head Circumference      Peak Flow      Pain Score      Pain Loc      Pain Edu?      Excl. in [place]?    No data found.  Updated Vital Signs BP (!) 169/82 (BP Location: Left Arm)   Pulse 92   Temp 98.6 F (37 C) (Oral)   Resp 18   SpO2 98%   Visual Acuity Right Eye Distance:   Left Eye Distance:   Bilateral Distance:    Right Eye Near:   Left Eye Near:    Bilateral Near:     Physical Exam  Constitutional: She is oriented to person, place, and time. She appears well-developed and well-nourished. She appears ill. No distress.  Patient  sitting in exam chair in no acute distress.   HENT:  Head: Normocephalic and atraumatic.  Right Ear: External ear and ear canal normal. Tympanic membrane is injected, erythematous and bulging. A middle ear effusion is present. Decreased hearing is noted.  Left Ear: External ear and ear canal normal. Tympanic membrane is bulging. Tympanic membrane is not injected  and not erythematous. A middle ear effusion is present. Decreased hearing is noted.  Nose: Rhinorrhea present. Right sinus exhibits no maxillary sinus tenderness and no frontal sinus tenderness. Left sinus exhibits no maxillary sinus tenderness and no frontal sinus tenderness.  Mouth/Throat: Uvula is midline and mucous membranes are normal. Posterior oropharyngeal erythema present. No oropharyngeal exudate or posterior oropharyngeal edema.  Eyes: EOM are normal. Right eye exhibits no discharge and no exudate. Left eye exhibits no discharge and no exudate. Right conjunctiva is not injected. Left conjunctiva is injected.  Neck: Normal range of motion. Neck supple.  Cardiovascular: Normal rate, regular rhythm and normal heart sounds.  Pulmonary/Chest: Effort normal. No stridor. No respiratory distress. She has no decreased breath sounds. She has wheezes in the right upper field and the left upper field. She has rhonchi in the right upper field, the right lower field, the left upper field and the left lower field. She has no rales.  Musculoskeletal: Normal range of motion.  Lymphadenopathy:       Head (right side): Tonsillar adenopathy present.       Head (left side): Tonsillar adenopathy present.    She has cervical adenopathy.       Right cervical: Superficial cervical adenopathy present.       Left cervical: Superficial cervical adenopathy present.  Neurological: She is alert and oriented to person, place, and time.  Skin: Skin is warm and dry. Capillary refill takes less than 2 seconds. No erythema.  Psychiatric: She has a normal mood and  affect. Her behavior is normal. Judgment and thought content normal.  Vitals reviewed.    UC Treatments / Results  Labs (all labs ordered are listed, but only abnormal results are displayed) Labs Reviewed  CULTURE, GROUP A STREP [facility])  POCT RAPID STREP A    EKG None  Radiology No results found.  Procedures Procedures (including critical care time)  Medications Ordered in UC Medications - No data to display  Initial Impression / Assessment and Plan / UC Course  I have reviewed the triage vital signs and the nursing notes.  Pertinent labs & imaging results that were available during my care of the patient were reviewed by me and considered in my medical decision making (see chart for details).     Reviewed negative rapid strep test with patient. Discussed that she has an ear infection along with cough. Will treat as documented below. Since patient is unable to see her PCP for a few weeks, will restart her on her DM medication. Encouraged to continue to monitor blood pressure. Note written for work. Follow-up here in 3 to 4 days if not improving and schedule appointment with a PCP as soon as she can for management of her chronic health conditions.  Final Clinical Impressions(s) / UC Diagnoses   Final diagnoses:  Acute suppurative otitis media of both ears without spontaneous rupture of tympanic membranes, recurrence not specified  Cough  Acute pharyngitis, unspecified etiology  Uncontrolled type 2 diabetes mellitus with hyperglycemia ([place])     Discharge Instructions     Recommend start Augmentin 875mg  twice a day as directed. May continue OTC Mucinex DM as directed for cough. May use Albuterol inhaler 2 puffs every 6 hours as needed for wheezing or cough. May take Tussionex cough syrup 1 teaspoon at night as needed. Restart Glucophage 500mg  twice a day as directed. Recommend follow-up here in 3 to 4 days if not improving. Also schedule appointment with a PCP for Diabetes  and  HTN management.    ED Prescriptions    Medication Sig Dispense Auth. Provider   metFORMIN (GLUCOPHAGE) 500 MG tablet Take 1 tablet (500 mg total) by mouth 2 (two) times daily with a meal. 60 tablet Katy Apo, NP   amoxicillin-clavulanate (AUGMENTIN) 875-125 MG tablet Take 1 tablet by mouth every 12 (twelve) hours for 7 days. 14 tablet Angelina Venard, Nicholes Stairs, NP   chlorpheniramine-HYDROcodone (TUSSIONEX PENNKINETIC ER) 10-8 MG/5ML SUER Take 5 mLs by mouth at bedtime as needed for cough. 60 mL Katy Apo, NP   albuterol (PROVENTIL HFA;VENTOLIN HFA) 108 (90 Base) MCG/ACT inhaler Inhale 2 puffs into the lungs every 6 (six) hours as needed for wheezing or shortness of breath. 1 Inhaler Monte Zinni, Nicholes Stairs, NP     Controlled Substance Prescriptions [place] Controlled Substance Registry consulted? Yes- Last active Rx was on 07/27/17 [facility]. Previous active Rx was over a year ago for Vicodin. No other current active Rx. I feel the benefits outweigh the risks for a Rx for an controlled substance for this patient.     Katy Apo, NP 10/16/17 [phone]

## 2017-10-15 NOTE — ED Triage Notes (Signed)
Pt sts cough and congestion with sore throat and body aches

## 2017-10-17 LAB — CULTURE, GROUP A STREP (THRC)

## 2017-10-20 ENCOUNTER — Encounter (HOSPITAL_COMMUNITY): Payer: Self-pay | Admitting: Emergency Medicine

## 2017-10-20 ENCOUNTER — Emergency Department (HOSPITAL_COMMUNITY)
Admission: EM | Admit: 2017-10-20 | Discharge: 2017-10-21 | Disposition: A | Discharge status: Home / Self Care | Payer: 59 | Attending: Emergency Medicine | Admitting: Emergency Medicine

## 2017-10-20 ENCOUNTER — Emergency Department (HOSPITAL_COMMUNITY): Payer: 59

## 2017-10-20 ENCOUNTER — Other Ambulatory Visit: Payer: Self-pay

## 2017-10-20 DIAGNOSIS — M25561 Pain in right knee: Secondary | ICD-10-CM

## 2017-10-20 DIAGNOSIS — Z7984 Long term (current) use of oral hypoglycemic drugs: Secondary | ICD-10-CM | POA: Insufficient documentation

## 2017-10-20 DIAGNOSIS — I10 Essential (primary) hypertension: Secondary | ICD-10-CM | POA: Diagnosis not present

## 2017-10-20 DIAGNOSIS — M2391 Unspecified internal derangement of right knee: Secondary | ICD-10-CM | POA: Diagnosis not present

## 2017-10-20 DIAGNOSIS — E119 Type 2 diabetes mellitus without complications: Secondary | ICD-10-CM | POA: Insufficient documentation

## 2017-10-20 DIAGNOSIS — Z79899 Other long term (current) drug therapy: Secondary | ICD-10-CM | POA: Insufficient documentation

## 2017-10-20 NOTE — ED Triage Notes (Signed)
Pt c/o of right knee pain x 2 weeks, stood today at work and R knee buckled to the side, since then increased knee pain and numbness to toes with nausea

## 2017-10-20 NOTE — ED Notes (Signed)
Patient transported to X-ray 

## 2017-10-21 MED ORDER — NAPROXEN 250 MG PO TABS: 250.0000 mg | ORAL_TABLET | Freq: Two times a day (BID) | ORAL | 0 refills | Status: DC

## 2017-10-21 MED ORDER — KETOROLAC TROMETHAMINE 60 MG/2ML IM SOLN
60.0000 mg | Freq: Once | INTRAMUSCULAR | Status: AC
  Administered 2017-10-21: 60 mg via INTRAMUSCULAR
  Filled 2017-10-21: qty 2

## 2017-10-21 NOTE — ED Notes (Signed)
Pt refused knee immobilizer stating she "already has one".

## 2017-10-21 NOTE — ED Provider Notes (Signed)
[facility] EMERGENCY DEPARTMENT Provider Note   CSN: [id number] Arrival date & time: 10/20/17  2303  Time seen 12:57 AM   History   Chief Complaint Chief Complaint  Patient presents with  . Knee Pain    HPI Christina Cameron is a 52 y.o. female.  HPI patient reports she has had pain in her right knee for the past 2 weeks.  She denies any known injury or precipitating event.  She states she thinks it has had some swelling off and on and she feels like there is something behind her knee.  She states today at work she stood up and when they happen her "leg went sideways" she then states her kneecap went sideways.  I asked her if her knee gave way on her and she got upset with me.  She states the pain is sharp and dull she feels like there is something under the kneecap.  She states all her toes on the right are numb as his her foot.  Patient reports she has some problems in her left knee about a year ago came to the ED and had a ultrasound done which showed some fluid.  She states they attempted to draw the fluid off of the knee twice without success.  She did not have to follow-up with orthopedics.  PCP Sinda Du, MD   Past Medical History:  Diagnosis Date  . Cancer ([place])    cervical 1989  . Diabetes mellitus without complication ([place])   . Enlarged heart   . High cholesterol   . Hypertension   . Tachycardia     Patient Active Problem List   Diagnosis Date Noted  . DOE (dyspnea on exertion)   . Chest pain 09/08/2014  . Heart palpitations 08/04/2014  . DM (diabetes mellitus) ([place]) 08/04/2014    Past Surgical History:  Procedure Laterality Date  . CARDIAC CATHETERIZATION    . CARDIAC CATHETERIZATION N/A 12/14/2014   Procedure: Right/Left Heart Cath and Coronary Angiography;  Surgeon: Sherren Mocha, MD;  Location: [facility];  Service: Cardiovascular;  Laterality: N/A;  . CARDIAC ELECTROPHYSIOLOGY STUDY AND ABLATION    . ENDOMETRIAL ABLATION       OB History     Gravida  3   Para  2   Term  2   Preterm      AB  1   Living  2     SAB  1   TAB      Ectopic      Multiple      Live Births               Home Medications    Prior to Admission medications   Medication Sig Start Date End Date Taking? Authorizing Provider  acetaminophen (TYLENOL) 500 MG tablet Take 500 mg by mouth every 4 (four) hours as needed for mild pain.    [provider]  albuterol (PROVENTIL HFA;VENTOLIN HFA) 108 (90 Base) MCG/ACT inhaler Inhale 2 puffs into the lungs every 6 (six) hours as needed for wheezing or shortness of breath. 10/15/17   Amyot, Nicholes Stairs, NP  amoxicillin-clavulanate (AUGMENTIN) 875-125 MG tablet Take 1 tablet by mouth every 12 (twelve) hours for 7 days. 10/15/17 10/22/17  Katy Apo, NP  atorvastatin (LIPITOR) 40 MG tablet Take 40 mg by mouth daily at 6 PM.  08/01/16   [provider]  chlorpheniramine-HYDROcodone (TUSSIONEX PENNKINETIC ER) 10-8 MG/5ML SUER Take 5 mLs by mouth at bedtime as  needed for cough. 10/15/17   Katy Apo, NP  furosemide (LASIX) 20 MG tablet TAKE ONE TABLET BY MOUTH ONCE DAILY AS NEEDED FOR SWELLING OF THE LEGS AND HANDS. 05/23/16   Herminio Commons, MD  hydrochlorothiazide (MICROZIDE) 12.5 MG capsule TAKE 1 TAB DAILY. TAKE EXTRA 12.5 MG DAILY AS NEEDED FOR SWELLING 10/29/14   Herminio Commons, MD  ibuprofen (ADVIL,MOTRIN) 200 MG tablet Take 400 mg by mouth daily as needed for mild pain.    [provider]  lisinopril (PRINIVIL,ZESTRIL) 10 MG tablet Take 1 tablet (10 mg total) by mouth daily. 10/29/14   Herminio Commons, MD  metFORMIN (GLUCOPHAGE) 500 MG tablet Take 1 tablet (500 mg total) by mouth 2 (two) times daily with a meal. 10/15/17   Amyot, Nicholes Stairs, NP    Family History Family History  Problem Relation Age of Onset  . Cancer Mother   . Cancer Father   . Stroke Brother   . Hypertension Brother     Social History Social History   Tobacco Use  . Smoking  status: Never Smoker  . Smokeless tobacco: Never Used  Substance Use Topics  . Alcohol use: Yes    Comment: seldom  . Drug use: No  employed   Allergies   Patient has no known allergies.   Review of Systems Review of Systems  All other systems reviewed and are negative.    Physical Exam Updated Vital Signs BP 127/77   Pulse 89   Temp 98.2 F (36.8 C) (Oral)   Resp 18   Ht 5\' 5"  (1.651 m)   Wt 127 kg (280 lb)   SpO2 97%   BMI 46.59 kg/m   Physical Exam  Constitutional: She is oriented to person, place, and time. She appears well-developed and well-nourished.  Non-toxic appearance. She does not appear ill. No distress.  asleep  HENT:  Head: Normocephalic and atraumatic.  Right Ear: External ear normal.  Left Ear: External ear normal.  Nose: Nose normal. No mucosal edema or rhinorrhea.  Eyes: Conjunctivae and EOM are normal.  Neck: Normal range of motion and full passive range of motion without pain. Neck supple.  Cardiovascular: Normal rate.  Pulmonary/Chest: Effort normal. No respiratory distress. She has no rhonchi. She exhibits no crepitus.  Abdominal: Normal appearance.  Musculoskeletal: Normal range of motion. She exhibits tenderness. She exhibits no edema.  Patient has some tenderness over her medial joint space, there is no obvious effusion felt.  Her patella appears to be in normal position.  Neurological: She is alert and oriented to person, place, and time. She has normal strength. No cranial nerve deficit.  Skin: Skin is warm, dry and intact. No rash noted. No erythema. No pallor.  Psychiatric: She has a normal mood and affect. Her speech is normal and behavior is normal. Her mood appears not anxious.  Nursing note and vitals reviewed.    ED Treatments / Results  Labs (all labs ordered are listed, but only abnormal results are displayed) Labs Reviewed - No data to display  EKG None  Radiology Dg Knee Complete 4 Views Right  Result Date:  10/20/2017 CLINICAL DATA:  Right knee pain for 2 weeks. Right knee buckled to the side today with increased pain and numbness to the toes. Nausea. EXAM: RIGHT KNEE - COMPLETE 4+ VIEW COMPARISON:  None. FINDINGS: Degenerative changes in the right knee with medial compartment narrowing and small tricompartment osteophyte formation. No evidence of acute fracture or dislocation. No focal  bone lesion or bone destruction. No significant effusion. Soft tissues are unremarkable. IMPRESSION: Mild degenerative changes in the right knee. No acute bony abnormalities identified. Electronically Signed   By: Lucienne Capers M.D.   On: 10/20/2017 23:58    Procedures Procedures (including critical care time)  Medications Ordered in ED Medications - No data to display   Initial Impression / Assessment and Plan / ED Course  I have reviewed the triage vital signs and the nursing notes.  Pertinent labs & imaging results that were available during my care of the patient were reviewed by me and considered in my medical decision making (see chart for details).    Knee immobilizer was ordered for patient but she refused it stating she had one at home.  Patient is very unhappy of her care.  She was upset that I told her she had some arthritis and that was always wrong with her although I did not say that.  I had also discussed with her that there are internal structures in the knee the do not show up on x-ray and that she would need to follow-up with an orthopedist for further evaluation of her knee was improving.  Patient was given Toradol IM for pain.  Her BUN and creatinine 1 year ago were 15 and 0.58.  Final Clinical Impressions(s) / ED Diagnoses   Final diagnoses:  Knee pain, right    ED Discharge Orders    None       Rolland Porter, MD 10/21/17 0145

## 2017-10-21 NOTE — Discharge Instructions (Addendum)
Elevate your leg. Use cold therapy on your knee. Wear the knee immobilizer to stabilize your knee and to prevent further injury. Take the naproxen for pain. You need to call Dr Harrison's office to have him evaluate your knee for internal injury that is not visible on xrays as we discussed tonight.

## 2017-10-21 NOTE — ED Notes (Signed)
Patient refused knee immobilizer. Patient states she has her on at home.

## 2017-10-26 ENCOUNTER — Other Ambulatory Visit (HOSPITAL_COMMUNITY): Payer: Self-pay | Admitting: Orthopedic Surgery

## 2017-10-26 DIAGNOSIS — M25561 Pain in right knee: Secondary | ICD-10-CM

## 2017-11-02 ENCOUNTER — Ambulatory Visit (HOSPITAL_COMMUNITY)
Admission: RE | Admit: 2017-11-02 | Discharge: 2017-11-02 | Disposition: A | Discharge status: Home / Self Care | Payer: 59 | Source: Ambulatory Visit | Attending: Orthopedic Surgery | Admitting: Orthopedic Surgery

## 2017-11-02 DIAGNOSIS — M25561 Pain in right knee: Secondary | ICD-10-CM | POA: Insufficient documentation

## 2017-11-02 DIAGNOSIS — R937 Abnormal findings on diagnostic imaging of other parts of musculoskeletal system: Secondary | ICD-10-CM | POA: Insufficient documentation

## 2017-11-02 DIAGNOSIS — M1711 Unilateral primary osteoarthritis, right knee: Secondary | ICD-10-CM | POA: Insufficient documentation

## 2017-11-09 DIAGNOSIS — M25561 Pain in right knee: Secondary | ICD-10-CM | POA: Diagnosis not present

## 2017-11-23 DIAGNOSIS — M25561 Pain in right knee: Secondary | ICD-10-CM | POA: Diagnosis not present

## 2018-01-21 ENCOUNTER — Emergency Department (HOSPITAL_COMMUNITY)
Admission: EM | Admit: 2018-01-21 | Discharge: 2018-01-21 | Disposition: A | Discharge status: Home / Self Care | Payer: 59 | Attending: Emergency Medicine | Admitting: Emergency Medicine

## 2018-01-21 ENCOUNTER — Emergency Department (HOSPITAL_COMMUNITY): Payer: 59

## 2018-01-21 ENCOUNTER — Other Ambulatory Visit: Payer: Self-pay

## 2018-01-21 ENCOUNTER — Encounter (HOSPITAL_COMMUNITY): Payer: Self-pay | Admitting: Emergency Medicine

## 2018-01-21 DIAGNOSIS — R079 Chest pain, unspecified: Secondary | ICD-10-CM

## 2018-01-21 DIAGNOSIS — R0602 Shortness of breath: Secondary | ICD-10-CM | POA: Diagnosis not present

## 2018-01-21 DIAGNOSIS — E119 Type 2 diabetes mellitus without complications: Secondary | ICD-10-CM | POA: Diagnosis not present

## 2018-01-21 DIAGNOSIS — M7989 Other specified soft tissue disorders: Secondary | ICD-10-CM | POA: Diagnosis not present

## 2018-01-21 DIAGNOSIS — I10 Essential (primary) hypertension: Secondary | ICD-10-CM | POA: Insufficient documentation

## 2018-01-21 DIAGNOSIS — R0789 Other chest pain: Secondary | ICD-10-CM | POA: Diagnosis not present

## 2018-01-21 DIAGNOSIS — Z7984 Long term (current) use of oral hypoglycemic drugs: Secondary | ICD-10-CM | POA: Insufficient documentation

## 2018-01-21 DIAGNOSIS — Z79899 Other long term (current) drug therapy: Secondary | ICD-10-CM | POA: Insufficient documentation

## 2018-01-21 LAB — CBC
HEMATOCRIT: 44.3 % (ref 36.0–46.0)
Hemoglobin: 15.3 g/dL — ABNORMAL HIGH (ref 12.0–15.0)
MCH: 30.8 pg (ref 26.0–34.0)
MCHC: 34.5 g/dL (ref 30.0–36.0)
MCV: 89.3 fL (ref 78.0–100.0)
PLATELETS: 212 10*3/uL (ref 150–400)
RBC: 4.96 MIL/uL (ref 3.87–5.11)
RDW: 13.4 % (ref 11.5–15.5)
WBC: 7.6 10*3/uL (ref 4.0–10.5)

## 2018-01-21 LAB — I-STAT TROPONIN, ED
TROPONIN I, POC: 0 ng/mL (ref 0.00–0.08)
Troponin i, poc: 0 ng/mL (ref 0.00–0.08)

## 2018-01-21 LAB — BASIC METABOLIC PANEL
Anion gap: 9 (ref 5–15)
BUN: 18 mg/dL (ref 6–20)
CHLORIDE: 98 mmol/L (ref 98–111)
CO2: 27 mmol/L (ref 22–32)
CREATININE: 0.57 mg/dL (ref 0.44–1.00)
Calcium: 9.3 mg/dL (ref 8.9–10.3)
GFR calc Af Amer: >60 mL/min (ref >60)
GFR calc non Af Amer: >60 mL/min (ref >60)
GLUCOSE: 355 mg/dL — AB (ref 70–99)
Potassium: 4.4 mmol/L (ref 3.5–5.1)
Sodium: 134 mmol/L — ABNORMAL LOW (ref 135–145)

## 2018-01-21 MED ORDER — NITROGLYCERIN 0.4 MG SL SUBL
0.4000 mg | SUBLINGUAL_TABLET | SUBLINGUAL | Status: DC | PRN
  Administered 2018-01-21: 0.4 mg via SUBLINGUAL
  Filled 2018-01-21: qty 1

## 2018-01-21 MED ORDER — ASPIRIN 81 MG PO CHEW
324.0000 mg | CHEWABLE_TABLET | Freq: Once | ORAL | Status: AC
  Administered 2018-01-21: 324 mg via ORAL
  Filled 2018-01-21: qty 4

## 2018-01-21 NOTE — ED Provider Notes (Signed)
[facility] EMERGENCY DEPARTMENT Provider Note   CSN: [id number] Arrival date & time: 01/21/18  2694     History   Chief Complaint Chief Complaint  Patient presents with  . Chest Pain    HPI Christina Cameron is a 52 y.o. female.  HPI Patient states of the last 2 nights she is woken in the middle light with central chest tightness that radiates to her right shoulder.  States that her symptoms started around midnight last night.  Describes as heaviness on her chest.  Radiates to her right shoulder.  She has some associated shortness of breath but states the pain is not worse with deep breathing.  Has chronic lower extremity swelling which is unchanged.  No known history of coronary artery disease personally or family history.  States that for the last few days she has not been feeling well with some generalized fatigue.  She also noted some mild abdominal discomfort for the last few days.  Described as stomach being "upset".  No nausea, vomiting or diarrhea.  No melanotic or grossly bloody stools. Past Medical History:  Diagnosis Date  . Cancer ([place])    cervical 1989  . Diabetes mellitus without complication ([place])   . Enlarged heart   . High cholesterol   . Hypertension   . Tachycardia     Patient Active Problem List   Diagnosis Date Noted  . DOE (dyspnea on exertion)   . Chest pain 09/08/2014  . Heart palpitations 08/04/2014  . DM (diabetes mellitus) ([place]) 08/04/2014    Past Surgical History:  Procedure Laterality Date  . CARDIAC CATHETERIZATION    . CARDIAC CATHETERIZATION N/A 12/14/2014   Procedure: Right/Left Heart Cath and Coronary Angiography;  Surgeon: Sherren Mocha, MD;  Location: [place] [facility];  Service: Cardiovascular;  Laterality: N/A;  . CARDIAC ELECTROPHYSIOLOGY STUDY AND ABLATION    . ENDOMETRIAL ABLATION       OB History    Gravida  3   Para  2   Term  2   Preterm      AB  1   Living  2     SAB  1   TAB      Ectopic      Multiple     Live Births               Home Medications    Prior to Admission medications   Medication Sig Start Date End Date Taking? Authorizing Provider  acetaminophen (TYLENOL) 500 MG tablet Take 500 mg by mouth every 4 (four) hours as needed for mild pain.   Yes [provider]  atorvastatin (LIPITOR) 40 MG tablet Take 40 mg by mouth daily at 6 PM.  08/01/16  Yes [provider]  furosemide (LASIX) 20 MG tablet TAKE ONE TABLET BY MOUTH ONCE DAILY AS NEEDED FOR SWELLING OF THE LEGS AND HANDS. 05/23/16  Yes Herminio Commons, MD  hydrochlorothiazide (MICROZIDE) 12.5 MG capsule TAKE 1 TAB DAILY. TAKE EXTRA 12.5 MG DAILY AS NEEDED FOR SWELLING 10/29/14  Yes Herminio Commons, MD  ibuprofen (ADVIL,MOTRIN) 200 MG tablet Take 400 mg by mouth daily as needed for mild pain.   Yes [provider]  lisinopril (PRINIVIL,ZESTRIL) 10 MG tablet Take 1 tablet (10 mg total) by mouth daily. 10/29/14  Yes Herminio Commons, MD  metFORMIN (GLUCOPHAGE) 500 MG tablet Take 1 tablet (500 mg total) by mouth 2 (two) times daily with a meal. 10/15/17  Yes Zoe Lan  Gwenlyn Found, NP  albuterol (PROVENTIL HFA;VENTOLIN HFA) 108 (90 Base) MCG/ACT inhaler Inhale 2 puffs into the lungs every 6 (six) hours as needed for wheezing or shortness of breath. 10/15/17   Amyot, Nicholes Stairs, NP    Family History Family History  Problem Relation Age of Onset  . Cancer Mother   . Cancer Father   . Stroke Brother   . Hypertension Brother     Social History Social History   Tobacco Use  . Smoking status: Never Smoker  . Smokeless tobacco: Never Used  Substance Use Topics  . Alcohol use: Yes    Comment: seldom  . Drug use: No     Allergies   Patient has no known allergies.   Review of Systems Review of Systems  Constitutional: Positive for fatigue. Negative for chills and fever.  HENT: Negative for sore throat and trouble swallowing.   Eyes: Negative for photophobia and visual disturbance.   Respiratory: Positive for chest tightness and shortness of breath. Negative for cough.   Cardiovascular: Positive for chest pain and leg swelling. Negative for palpitations.  Gastrointestinal: Negative for abdominal pain, constipation, diarrhea, nausea and vomiting.  Musculoskeletal: Negative for back pain, joint swelling, myalgias and neck pain.  Skin: Negative for rash and wound.  Neurological: Negative for dizziness, weakness, light-headedness, numbness and headaches.  All other systems reviewed and are negative.    Physical Exam Updated Vital Signs BP 122/77 (BP Location: Left Arm)   Pulse 77   Temp 98.2 F (36.8 C) (Oral)   Resp 16   Ht 5\' 4"  (1.626 m)   Wt 129.3 kg   SpO2 100%   BMI 48.92 kg/m   Physical Exam  Constitutional: She is oriented to person, place, and time. She appears well-developed and well-nourished. No distress.  HENT:  Head: Normocephalic and atraumatic.  Mouth/Throat: Oropharynx is clear and moist. No oropharyngeal exudate.  Eyes: Pupils are equal, round, and reactive to light. EOM are normal.  Neck: Normal range of motion. Neck supple.  Cardiovascular: Normal rate and regular rhythm. Exam reveals no gallop and no friction rub.  No murmur heard. Pulmonary/Chest: Effort normal and breath sounds normal. No stridor. No respiratory distress. She has no wheezes. She has no rales. She exhibits no tenderness.  Abdominal: Soft. Bowel sounds are normal. There is no tenderness. There is no rebound and no guarding.  Musculoskeletal: Normal range of motion. She exhibits edema. She exhibits no tenderness.  1+ lower extremity edema.  No calf asymmetry or tenderness.  Distal pulses intact.  No midline thoracic or lumbar tenderness.  No CVA tenderness.  Lymphadenopathy:    She has no cervical adenopathy.  Neurological: She is alert and oriented to person, place, and time.  Skin: Skin is warm and dry. Capillary refill takes less than 2 seconds. No rash noted. She is  not diaphoretic. No erythema.  Psychiatric: She has a normal mood and affect. Her behavior is normal.  Nursing note and vitals reviewed.    ED Treatments / Results  Labs (all labs ordered are listed, but only abnormal results are displayed) Labs Reviewed  BASIC METABOLIC PANEL - Abnormal; Notable for the following components:      Result Value   Sodium 134 (*)    Glucose, Bld 355 (*)    All other components within normal limits  CBC - Abnormal; Notable for the following components:   Hemoglobin 15.3 (*)    All other components within normal limits  I-STAT TROPONIN, ED  I-STAT  TROPONIN, ED    EKG EKG Interpretation  Date/Time:  Monday January 21 2018 08:31:22 EDT Ventricular Rate:  76 PR Interval:    QRS Duration: 90 QT Interval:  381 QTC Calculation: 429 R Axis:   -33 Text Interpretation:  Sinus rhythm Left axis deviation Low voltage, precordial leads Confirmed by Julianne Rice [phone]) on 01/21/2018 9:18:33 AM   Radiology Dg Chest 2 View  Result Date: 01/21/2018 CLINICAL DATA:  Mid chest pain and tightness x couple of days/diabetic/htn/non smoker/ hx heart ablation and cath/hx cervical cancer EXAM: CHEST - 2 VIEW COMPARISON:  08/21/2016 FINDINGS: The heart size and mediastinal contours are within normal limits. Both lungs are clear. The visualized skeletal structures are unremarkable. IMPRESSION: No active cardiopulmonary disease. Electronically Signed   By: Nolon Nations M.D.   On: 01/21/2018 10:22    Procedures Procedures (including critical care time)  Medications Ordered in ED Medications  nitroGLYCERIN (NITROSTAT) SL tablet 0.4 mg (0.4 mg Sublingual Given 01/21/18 1001)  aspirin chewable tablet 324 mg (324 mg Oral Given 01/21/18 1002)     Initial Impression / Assessment and Plan / ED Course  I have reviewed the triage vital signs and the nursing notes.  Pertinent labs & imaging results that were available during my care of the patient were reviewed by me  and considered in my medical decision making (see chart for details).    Patient's chest pain is completely resolved.  Troponin x2 is normal.  Discussed with Dr. Domenic Polite.  Patient will call and schedule appointment for close follow-up.  Return precautions given.   Final Clinical Impressions(s) / ED Diagnoses   Final diagnoses:  None    ED Discharge Orders    None       Julianne Rice, MD 01/21/18 1408

## 2018-01-21 NOTE — ED Triage Notes (Signed)
Chest pain on and off x 2 days.  This morning pt states she feels like an elephant is sitting on her chest and pain that radiates to the right shoulder.

## 2018-01-22 ENCOUNTER — Telehealth: Payer: Self-pay | Admitting: Cardiovascular Disease

## 2018-01-22 NOTE — Telephone Encounter (Signed)
Pt c/o of Chest Pain: 1. Are you having CP right now? NO  2. Are you experiencing any other symptoms (ex. SOB, nausea, vomiting, sweating)? Shortness of breath -tightness in chest   3. How long have you been experiencing CP?  01-19-2018  4. Is your CP continuous or coming and going?  Yes  5. Have you taken Nitroglycerin? ER at [facility] on 01-21-2018

## 2018-01-22 NOTE — Telephone Encounter (Signed)
Patient went to ER yesterday for CP. Patient states they did not find anything. Nitroglycerin was given and patient states she felt better. Patient reports no chest pain today but is still having chest tightness and shortness of breath. Apt scheduled on 9/26 with APP (first available). Patient states she has been having swelling in feet, legs and hands. Patient does not want to wait until 9/26 to be seen. Advised patient this was the first available.

## 2018-01-25 MED ORDER — ISOSORBIDE MONONITRATE ER 30 MG PO TB24: 30.0000 mg | ORAL_TABLET | Freq: Every day | ORAL | 6 refills | Status: DC

## 2018-01-25 NOTE — Telephone Encounter (Signed)
Left message to return call 

## 2018-01-25 NOTE — Telephone Encounter (Signed)
Any further suggestions till she can be seen by provider ?

## 2018-01-25 NOTE — Telephone Encounter (Signed)
Patient notified & is willing to try the Imdur.  Will send new prescription to [facility].

## 2018-01-25 NOTE — Addendum Note (Signed)
Addended by: Laurine Blazer on: 01/25/2018 04:13 PM   Modules accepted: Orders

## 2018-01-25 NOTE — Telephone Encounter (Signed)
Please refer to my note from March 2018. Normal cath in 2016. Symptoms do not appear to be cardiac in etiology. I suppose a trial of Imdur 30 mg could be tried for possible microvascular angina.

## 2018-02-04 ENCOUNTER — Telehealth: Payer: Self-pay | Admitting: Cardiovascular Disease

## 2018-02-04 MED ORDER — RANOLAZINE ER 500 MG PO TB12: 500.0000 mg | ORAL_TABLET | Freq: Two times a day (BID) | ORAL | 6 refills | Status: DC

## 2018-02-04 NOTE — Telephone Encounter (Signed)
Fwd to provider for suggestions.  OV scheduled for 02/21/2018 in [place] office with Melina Copa, PA.

## 2018-02-04 NOTE — Telephone Encounter (Signed)
Patient called stating that she had to stop taking new medication this past Saturday due to severe headaches. Said they were so severe that you could see her head pounding.  What to know if they is another medication she can take

## 2018-02-04 NOTE — Telephone Encounter (Signed)
Could try Ranexa 500 mg twice daily.

## 2018-02-04 NOTE — Telephone Encounter (Signed)
Stated that she could not tolerate the medication at all.  Was only able to take about a week.  She agrees to try the Ranexa.  Will send new prescription to [facility] now.  Patient given number [phone]) to enroll in the Ranexa co-pay coupon program.  Eligible commercially insured patients could pay no more that $5 for this medication.

## 2018-02-21 ENCOUNTER — Ambulatory Visit: Payer: 59 | Admitting: Physician Assistant

## 2018-03-29 ENCOUNTER — Encounter: Payer: Self-pay | Admitting: Student

## 2018-03-29 ENCOUNTER — Ambulatory Visit (INDEPENDENT_AMBULATORY_CARE_PROVIDER_SITE_OTHER): Payer: 59 | Admitting: Student

## 2018-03-29 VITALS — BP 138/84 | HR 95 | Ht 64.0 in | Wt 295.0 lb

## 2018-03-29 DIAGNOSIS — I10 Essential (primary) hypertension: Secondary | ICD-10-CM | POA: Diagnosis not present

## 2018-03-29 DIAGNOSIS — R600 Localized edema: Secondary | ICD-10-CM

## 2018-03-29 DIAGNOSIS — I519 Heart disease, unspecified: Secondary | ICD-10-CM

## 2018-03-29 DIAGNOSIS — R0789 Other chest pain: Secondary | ICD-10-CM

## 2018-03-29 DIAGNOSIS — E7800 Pure hypercholesterolemia, unspecified: Secondary | ICD-10-CM

## 2018-03-29 DIAGNOSIS — I471 Supraventricular tachycardia: Secondary | ICD-10-CM

## 2018-03-29 MED ORDER — FUROSEMIDE 20 MG PO TABS: ORAL_TABLET | ORAL | 3 refills | Status: DC

## 2018-03-29 NOTE — Patient Instructions (Addendum)
Medication Instructions:  Your physician has recommended you make the following change in your medication:  Stop Taking HCTZ  Take Lasix 40 mg Daily for 3 days then reduce to 20 mg Daily.   If you need a refill on your cardiac medications before your next appointment, please call your pharmacy.   Lab work: Your physician recommends that you return for lab work in: 2 weeks   If you have labs (blood work) drawn today and your tests are completely normal, you will receive your results only by: Marland Kitchen MyChart Message (if you have MyChart) OR . A paper copy in the mail If you have any lab test that is abnormal or we need to change your treatment, we will call you to review the results.  Testing/Procedures: Your physician has requested that you have an echocardiogram. Echocardiography is a painless test that uses sound waves to create images of your heart. It provides your doctor with information about the size and shape of your heart and how well your heart's chambers and valves are working. This procedure takes approximately one hour. There are no restrictions for this procedure.    Follow-Up: At [facility], you and your health needs are our priority.  As part of our continuing mission to provide you with exceptional heart care, we have created designated Provider Care Teams.  These Care Teams include your primary Cardiologist (physician) and Advanced Practice Providers (APPs -  Physician Assistants and Nurse Practitioners) who all work together to provide you with the care you need, when you need it. You will need a follow up appointment in 3 months.  Please call our office 2 months in advance to schedule this appointment.  You may see No primary care provider on file. or one of the following Advanced Practice Providers on your designated Care Team:   Mauritania, PA-C [facility]) . Ermalinda Barrios, PA-C ([place])  Any Other Special Instructions Will Be Listed Below (If  Applicable). Thank you for choosing [facility]!     DASH Eating Plan DASH stands for "Dietary Approaches to Stop Hypertension." The DASH eating plan is a healthy eating plan that has been shown to reduce high blood pressure (hypertension). It may also reduce your risk for type 2 diabetes, heart disease, and stroke. The DASH eating plan may also help with weight loss. What are tips for following this plan? General guidelines  Avoid eating more than 2,300 mg (milligrams) of salt (sodium) a day. If you have hypertension, you may need to reduce your sodium intake to 1,500 mg a day.  Limit alcohol intake to no more than 1 drink a day for nonpregnant women and 2 drinks a day for men. One drink equals 12 oz of beer, 5 oz of wine, or 1 oz of hard liquor.  Work with your health care provider to maintain a healthy body weight or to lose weight. Ask what an ideal weight is for you.  Get at least 30 minutes of exercise that causes your heart to beat faster (aerobic exercise) most days of the week. Activities may include walking, swimming, or biking.  Work with your health care provider or diet and nutrition specialist (dietitian) to adjust your eating plan to your individual calorie needs. Reading food labels  Check food labels for the amount of sodium per serving. Choose foods with less than 5 percent of the Daily Value of sodium. Generally, foods with less than 300 mg of sodium per serving fit into this eating  plan.  To find whole grains, look for the word "whole" as the first word in the ingredient list. Shopping  Buy products labeled as "low-sodium" or "no salt added."  Buy fresh foods. Avoid canned foods and premade or frozen meals. Cooking  Avoid adding salt when cooking. Use salt-free seasonings or herbs instead of table salt or sea salt. Check with your health care provider or pharmacist before using salt substitutes.  Do not fry foods. Ketter foods using healthy methods such  as baking, boiling, grilling, and broiling instead.  Grabert with heart-healthy oils, such as olive, canola, soybean, or sunflower oil. Meal planning   Eat a balanced diet that includes: ? 5 or more servings of fruits and vegetables each day. At each meal, try to fill half of your plate with fruits and vegetables. ? Up to 6-8 servings of whole grains each day. ? Less than 6 oz of lean meat, poultry, or fish each day. A 3-oz serving of meat is about the same size as a deck of cards. One egg equals 1 oz. ? 2 servings of low-fat dairy each day. ? A serving of nuts, seeds, or beans 5 times each week. ? Heart-healthy fats. Healthy fats called Omega-3 fatty acids are found in foods such as flaxseeds and coldwater fish, like sardines, salmon, and mackerel.  Limit how much you eat of the following: ? Canned or prepackaged foods. ? Food that is high in trans fat, such as fried foods. ? Food that is high in saturated fat, such as fatty meat. ? Sweets, desserts, sugary drinks, and other foods with added sugar. ? Full-fat dairy products.  Do not salt foods before eating.  Try to eat at least 2 vegetarian meals each week.  Eat more home-cooked food and less restaurant, buffet, and fast food.  When eating at a restaurant, ask that your food be prepared with less salt or no salt, if possible. What foods are recommended? The items listed may not be a complete list. Talk with your dietitian about what dietary choices are best for you. Grains Whole-grain or whole-wheat bread. Whole-grain or whole-wheat pasta. Brown rice. Modena Morrow. Bulgur. Whole-grain and low-sodium cereals. Pita bread. Low-fat, low-sodium crackers. Whole-wheat flour tortillas. Vegetables Fresh or frozen vegetables (raw, steamed, roasted, or grilled). Low-sodium or reduced-sodium tomato and vegetable juice. Low-sodium or reduced-sodium tomato sauce and tomato paste. Low-sodium or reduced-sodium canned vegetables. Fruits All  fresh, dried, or frozen fruit. Canned fruit in natural juice (without added sugar). Meat and other protein foods Skinless chicken or [place]. Ground chicken or [place]. Pork with fat trimmed off. Fish and seafood. Egg whites. Dried beans, peas, or lentils. Unsalted nuts, nut butters, and seeds. Unsalted canned beans. Lean cuts of beef with fat trimmed off. Low-sodium, lean deli meat. Dairy Low-fat (1%) or fat-free (skim) milk. Fat-free, low-fat, or reduced-fat cheeses. Nonfat, low-sodium ricotta or cottage cheese. Low-fat or nonfat yogurt. Low-fat, low-sodium cheese. Fats and oils Soft margarine without trans fats. Vegetable oil. Low-fat, reduced-fat, or light mayonnaise and salad dressings (reduced-sodium). Canola, safflower, olive, soybean, and sunflower oils. Avocado. Seasoning and other foods Herbs. Spices. Seasoning mixes without salt. Unsalted popcorn and pretzels. Fat-free sweets. What foods are not recommended? The items listed may not be a complete list. Talk with your dietitian about what dietary choices are best for you. Grains Baked goods made with fat, such as croissants, muffins, or some breads. Dry pasta or rice meal packs. Vegetables Creamed or fried vegetables. Vegetables in a cheese sauce. Regular  canned vegetables (not low-sodium or reduced-sodium). Regular canned tomato sauce and paste (not low-sodium or reduced-sodium). Regular tomato and vegetable juice (not low-sodium or reduced-sodium). Angie Fava. Olives. Fruits Canned fruit in a light or heavy syrup. Fried fruit. Fruit in cream or butter sauce. Meat and other protein foods Fatty cuts of meat. Ribs. Fried meat. Berniece Salines. Sausage. Bologna and other processed lunch meats. Salami. Fatback. Hotdogs. Bratwurst. Salted nuts and seeds. Canned beans with added salt. Canned or smoked fish. Whole eggs or egg yolks. Chicken or [place] with skin. Dairy Whole or 2% milk, cream, and half-and-half. Whole or full-fat cream cheese. Whole-fat or  sweetened yogurt. Full-fat cheese. Nondairy creamers. Whipped toppings. Processed cheese and cheese spreads. Fats and oils Butter. Stick margarine. Lard. Shortening. Ghee. Bacon fat. Tropical oils, such as coconut, palm kernel, or palm oil. Seasoning and other foods Salted popcorn and pretzels. Onion salt, garlic salt, seasoned salt, table salt, and sea salt. Worcestershire sauce. Tartar sauce. Barbecue sauce. Teriyaki sauce. Soy sauce, including reduced-sodium. Steak sauce. Canned and packaged gravies. Fish sauce. Oyster sauce. Cocktail sauce. Horseradish that you find on the shelf. Ketchup. Mustard. Meat flavorings and tenderizers. Bouillon cubes. Hot sauce and Tabasco sauce. Premade or packaged marinades. Premade or packaged taco seasonings. Relishes. Regular salad dressings. Where to find more information:  National Heart, Lung, and [place]: https://wilson-eaton.com/  American Heart Association: www.heart.org Summary  The DASH eating plan is a healthy eating plan that has been shown to reduce high blood pressure (hypertension). It may also reduce your risk for type 2 diabetes, heart disease, and stroke.  With the DASH eating plan, you should limit salt (sodium) intake to 2,300 mg a day. If you have hypertension, you may need to reduce your sodium intake to 1,500 mg a day.  When on the DASH eating plan, aim to eat more fresh fruits and vegetables, whole grains, lean proteins, low-fat dairy, and heart-healthy fats.  Work with your health care provider or diet and nutrition specialist (dietitian) to adjust your eating plan to your individual calorie needs. This information is not intended to replace advice given to you by your health care provider. Make sure you discuss any questions you have with your health care provider. Document Released: 05/04/2011 Document Revised: 05/08/2016 Document Reviewed: 05/08/2016 Elsevier Interactive Patient Education  [employer].

## 2018-03-29 NOTE — Progress Notes (Signed)
Cardiology Office Note    Date:  03/30/2018   ID:  Christina, Cameron Oct 11, 1965, MRN [id number]  PCP:  Christina Du, MD  Cardiologist: Christina Sable, MD    Chief Complaint  Patient presents with  . Follow-up    History of Present Illness:    Christina Cameron is a 52 y.o. female with past medical history of recurrent chest pain (catheterization in 11/2014 showing normal cors), HTN, HLD, Type 2 DM, and history of SVT (s/p ablation in 09/2004) who presents to the office today for follow-up from a recent Emergency Department visit.   She was last examined by Dr. Bronson Cameron in 07/2016 for chest pain which was thought to be non-cardiac and had previously been informed to follow-up on a PRN basis. Was planning to undergo gastric sleeve surgery in the future and no further cardiac testing was indicated prior to this. Was informed to follow-up as needed.   In the interim, she was evaluated at [facility] ED on 01/21/2018 for recurrent chest pain. Described a heaviness which radiated to her right shoulder. Symptoms resolved while in the ED with SL NTG. Labs showed WBC 7.6, Hgb 15.3, platelets 212, Na+ 134, and K+ 4.4. Initial and delta troponin values were negative. EKG showed NSR, HR 76, with no acute ST changes when compared to prior tracings. She was discharged home with close Cardiology follow-up recommended.   She called the office later that week and it was recommended she try Imdur 30mg  daily due to possible microvascular angina but she developed severe headaches with the medication. Therefore, was switched to Ranexa 500mg  BID. Follow-up was scheduled with Christina Copa, PA-C on 02/21/2018 but she cancelled the appointment.   In talking with the patient today, she reports having a constant tightness along her entire precordial region for the past few months. This has been chronic since onset but she does report symptoms lessened slightly with the initiation of Ranexa. Pain is not exacerbated  with exertion or positional changes.  She has noticed worsening dyspnea on exertion, edema, and fatigue over the past 2 to 3 months. Weight has trended up by 6 pounds on her home scales  She was prescribed Lasix 20 mg to take as needed but only utilizes this 1 to 2 days/week. Denies any associated orthopnea, PND, dizziness, presyncope, or persistent palpitations.    Past Medical History:  Diagnosis Date  . Cancer ([place])    cervical 1989  . Chest pain    a. normal cors by cath in 11/2014  . Diabetes mellitus without complication ([place])   . Enlarged heart   . High cholesterol   . Hypertension   . SVT (supraventricular tachycardia) ([facility])    a. s/p ablation by Dr. Lovena Le in 2006.  . Tachycardia     Past Surgical History:  Procedure Laterality Date  . CARDIAC CATHETERIZATION    . CARDIAC CATHETERIZATION N/A 12/14/2014   Procedure: Right/Left Heart Cath and Coronary Angiography;  Surgeon: Christina Mocha, MD;  Location: [facility];  Service: Cardiovascular;  Laterality: N/A;  . CARDIAC ELECTROPHYSIOLOGY STUDY AND ABLATION    . ENDOMETRIAL ABLATION      Current Medications: Outpatient Medications Prior to Visit  Medication Sig Dispense Refill  . acetaminophen (TYLENOL) 500 MG tablet Take 500 mg by mouth every 4 (four) hours as needed for mild pain.    Marland Kitchen albuterol (PROVENTIL HFA;VENTOLIN HFA) 108 (90 Base) MCG/ACT inhaler Inhale 2 puffs into the lungs every 6 (six) hours as  needed for wheezing or shortness of breath. 1 Inhaler 0  . atorvastatin (LIPITOR) 40 MG tablet Take 40 mg by mouth daily at 6 PM.     . ibuprofen (ADVIL,MOTRIN) 200 MG tablet Take 400 mg by mouth daily as needed for mild pain.    Marland Kitchen lisinopril (PRINIVIL,ZESTRIL) 20 MG tablet     . metFORMIN (GLUCOPHAGE) 500 MG tablet Take 1 tablet (500 mg total) by mouth 2 (two) times daily with a meal. 60 tablet 1  . ranolazine (RANEXA) 500 MG 12 hr tablet Take 1 tablet (500 mg total) by mouth 2 (two) times daily. 60 tablet 6  .  furosemide (LASIX) 20 MG tablet TAKE ONE TABLET BY MOUTH ONCE DAILY AS NEEDED FOR SWELLING OF THE LEGS AND HANDS. 30 tablet 0  . hydrochlorothiazide (MICROZIDE) 12.5 MG capsule TAKE 1 TAB DAILY. TAKE EXTRA 12.5 MG DAILY AS NEEDED FOR SWELLING 90 capsule 3  . lisinopril (PRINIVIL,ZESTRIL) 10 MG tablet Take 1 tablet (10 mg total) by mouth daily. 90 tablet 3   No facility-administered medications prior to visit.      Allergies:   Patient has no known allergies.   Social History   Socioeconomic History  . Marital status: Married    Spouse name: Not on file  . Number of children: Not on file  . Years of education: Not on file  . Highest education level: Not on file  Occupational History  . Not on file  Social Needs  . Financial resource strain: Not on file  . Food insecurity:    Worry: Not on file    Inability: Not on file  . Transportation needs:    Medical: Not on file    Non-medical: Not on file  Tobacco Use  . Smoking status: Never Smoker  . Smokeless tobacco: Never Used  Substance and Sexual Activity  . Alcohol use: Yes    Comment: seldom  . Drug use: No  . Sexual activity: Yes    Birth control/protection: None  Lifestyle  . Physical activity:    Days per week: Not on file    Minutes per session: Not on file  . Stress: Not on file  Relationships  . Social connections:    Talks on phone: Not on file    Gets together: Not on file    Attends religious service: Not on file    Active member of club or organization: Not on file    Attends meetings of clubs or organizations: Not on file    Relationship status: Not on file  Other Topics Concern  . Not on file  Social History Narrative  . Not on file     Family History:  The patient's family history includes Cancer in her father and mother; Hypertension in her brother; Stroke in her brother.   Review of Systems:   Please see the history of present illness.     General:  No chills, fever, night sweats or weight  changes.  Cardiovascular:  No orthopnea, palpitations, or paroxysmal nocturnal dyspnea. Positive for chest pain, dyspnea on exertion, and edema.  Dermatological: No rash, lesions/masses Respiratory: No cough, dyspnea Urologic: No hematuria, dysuria Abdominal:   No nausea, vomiting, diarrhea, bright red blood per rectum, melena, or hematemesis Neurologic:  No visual changes, wkns, changes in mental status. All other systems reviewed and are otherwise negative except as noted above.   Physical Exam:    VS:  BP 138/84   Pulse 95   Ht 5\' 4"  (  1.626 m)   Wt 295 lb (133.8 kg)   SpO2 97%   BMI 50.64 kg/m    General: Well developed, well nourished obese Caucasian female appearing in no acute distress. Head: Normocephalic, atraumatic, sclera non-icteric, no xanthomas, nares are without discharge.  Neck: No carotid bruits. JVD not elevated.  Lungs: Respirations regular and unlabored, without wheezes or rales.  Heart: Regular rate and rhythm. No S3 or S4.  No murmur, no rubs, or gallops appreciated. Abdomen: Soft, non-tender, non-distended with normoactive bowel sounds. No hepatomegaly. No rebound/guarding. No obvious abdominal masses. Msk:  Strength and tone appear normal for age. No joint deformities or effusions. Extremities: No clubbing or cyanosis. 1+ pitting edema up to mid-shins bilaterally.  Distal pedal pulses are 2+ bilaterally. Neuro: Alert and oriented X 3. Moves all extremities spontaneously. No focal deficits noted. Psych:  Responds to questions appropriately with a normal affect. Skin: No rashes or lesions noted  Wt Readings from Last 3 Encounters:  03/29/18 295 lb (133.8 kg)  01/21/18 285 lb (129.3 kg)  10/20/17 280 lb (127 kg)     Studies/Labs Reviewed:   EKG:  EKG is not ordered today. EKG from 01/21/2018 is reviewed which shows NSR, HR 76, with no acute ST changes when compared to prior tracings.   Recent Labs: 01/21/2018: BUN 18; Creatinine, Ser 0.57; Hemoglobin 15.3;  Platelets 212; Potassium 4.4; Sodium 134   Lipid Panel No results found for: CHOL, TRIG, HDL, CHOLHDL, VLDL, LDLCALC, LDLDIRECT  Additional studies/ records that were reviewed today include:   Echocardiogram: 11/2015 Study Conclusions  - Left ventricle: The cavity size was normal. Wall thickness was   normal. Systolic function was normal. The estimated ejection   fraction was in the range of 60% to 65%. Features are consistent   with a pseudonormal left ventricular filling pattern, with   concomitant abnormal relaxation and increased filling pressure   (grade 2 diastolic dysfunction). - Left atrium: The atrium was mildly dilated.  Cardiac Catheterization: 11/2014 FINAL CONCLUSIONS:  ANGIOGRAPHICALLY NORMAL CORONARY ARTERIES  NORMAL INTRACARDIAC PRESSURES/HEMODYNAMICS  RECOMMEND LIFESTYLE MODIFICATION  Assessment:    1. Diastolic dysfunction, left ventricle   2. Bilateral lower extremity edema   3. Atypical chest pain   4. Essential hypertension   5. Pure hypercholesterolemia   6. Paroxysmal SVT (supraventricular tachycardia) (HCC)      Plan:   In order of problems listed above:  1. Diastolic Dysfunction/ Lower Extremity Edema - she reports worsening dyspnea on exertion, fatigue, and lower extremity edema over the past 2-3 months with an associated unintentional weight gain. She does have 1+ pitting edema on examination but lungs are clear. Will plan to obtain a repeat echocardiogram to assess structural function. She was previously prescribed Lasix 20mg  daily but has not utilized this regularly until the past week. Recommended she take 40mg  daily for the next 3 days then resume back at baseline dosing of 20mg  daily. Will repeat a BMET in two weeks to reassess kidney function and electrolytes.  Also reviewed the importance of following a low-sodium diet and fluid restriction to less than 2 L per day.   2. Atypical Chest Pain - reports chronic pain along her precordial  region for over the past month which has been constant and not worse with exertion. Was started on Ranexa due to possible microvascular angina and she notes mild improvement in her symptoms. Reviewed she could stop this medication if not overall helping. Recent EKG showed no acute ischemic changes and troponin values  were negative during her ED visit. Overall, her pain seems atypical for a cardiac etiology. With her atypical symptoms and catheterization in 2016 showing normal cors, would not plan for further ischemic evaluation unless EF reduced or new WMA are noted on echocardiogram imaging.   3. HTN - BP is well-controlled at 138/84 during today's visit.  - continue Lisinopril 20mg  daily. No longer on HCTZ prior to this office visit given the use of Lasix. Removed from medication list.   4. HLD - followed by PCP. Remains on Atorvastatin 40mg  daily.   5. History of SVT - s/p ablation in 09/2004. Reports occasional palpitations which she been told in the past are PAC's and denies any changes in her symptoms. No recent palpitations.     Medication Adjustments/Labs and Tests Ordered: Current medicines are reviewed at length with the patient today.  Concerns regarding medicines are outlined above.  Medication changes, Labs and Tests ordered today are listed in the Patient Instructions below. Patient Instructions  Medication Instructions:  Your physician has recommended you make the following change in your medication:  Stop Taking HCTZ  Take Lasix 40 mg Daily for 3 days then reduce to 20 mg Daily.   If you need a refill on your cardiac medications before your next appointment, please call your pharmacy.   Lab work: Your physician recommends that you return for lab work in: 2 weeks   If you have labs (blood work) drawn today and your tests are completely normal, you will receive your results only by: Marland Kitchen MyChart Message (if you have MyChart) OR . A paper copy in the mail If you have any lab  test that is abnormal or we need to change your treatment, we will call you to review the results.  Testing/Procedures: Your physician has requested that you have an echocardiogram. Echocardiography is a painless test that uses sound waves to create images of your heart. It provides your doctor with information about the size and shape of your heart and how well your heart's chambers and valves are working. This procedure takes approximately one hour. There are no restrictions for this procedure.    Follow-Up: At [facility], you and your health needs are our priority.  As part of our continuing mission to provide you with exceptional heart care, we have created designated Provider Care Teams.  These Care Teams include your primary Cardiologist (physician) and Advanced Practice Providers (APPs -  Physician Assistants and Nurse Practitioners) who all work together to provide you with the care you need, when you need it. You will need a follow up appointment in 3 months.  Please call our office 2 months in advance to schedule this appointment.  You may see No primary care provider on file. or one of the following Advanced Practice Providers on your designated Care Team:   M[place], PA-C [facility]) . Ermalinda Barrios, PA-C ([place])  Any Other Special Instructions Will Be Listed Below (If Applicable). Thank you for choosing [facility]!     Signed, Erma Heritage, PA-C  03/30/2018 11:55 AM    [place] [address] [place], [place] [address] Phone: [phone]

## 2018-03-30 ENCOUNTER — Encounter: Payer: Self-pay | Admitting: Student

## 2018-04-08 ENCOUNTER — Other Ambulatory Visit (HOSPITAL_COMMUNITY)
Admission: RE | Admit: 2018-04-08 | Discharge: 2018-04-08 | Disposition: A | Discharge status: Home / Self Care | Payer: 59 | Source: Ambulatory Visit | Attending: Student | Admitting: Student

## 2018-04-08 ENCOUNTER — Telehealth: Payer: Self-pay | Admitting: Cardiovascular Disease

## 2018-04-08 DIAGNOSIS — I519 Heart disease, unspecified: Secondary | ICD-10-CM | POA: Insufficient documentation

## 2018-04-08 DIAGNOSIS — I10 Essential (primary) hypertension: Secondary | ICD-10-CM | POA: Insufficient documentation

## 2018-04-08 LAB — BASIC METABOLIC PANEL
Anion gap: 11 (ref 5–15)
BUN: 15 mg/dL (ref 6–20)
CALCIUM: 8.9 mg/dL (ref 8.9–10.3)
CO2: 25 mmol/L (ref 22–32)
Chloride: 99 mmol/L (ref 98–111)
Creatinine, Ser: 0.53 mg/dL (ref 0.44–1.00)
GFR calc Af Amer: >60 mL/min (ref >60)
Glucose, Bld: 314 mg/dL — ABNORMAL HIGH (ref 70–99)
POTASSIUM: 4 mmol/L (ref 3.5–5.1)
Sodium: 135 mmol/L (ref 135–145)

## 2018-04-08 NOTE — Telephone Encounter (Signed)
Needs note for work stating that she will need to take additonal bathroom breaks due to taking fluid pill.  Asked to either email to her or call her and have her pick it up

## 2018-04-08 NOTE — Telephone Encounter (Signed)
I will forward to provider. ?

## 2018-04-09 NOTE — Telephone Encounter (Signed)
Letter produced for pt, will pick up today

## 2018-04-09 NOTE — Telephone Encounter (Signed)
   Letter provided stating the patient needs additional restroom breaks given her current medication regimen.   Signed, Erma Heritage, PA-C 04/09/2018, 7:36 AM Pager: [phone]

## 2018-04-12 ENCOUNTER — Ambulatory Visit (HOSPITAL_COMMUNITY)
Admission: RE | Admit: 2018-04-12 | Discharge: 2018-04-12 | Disposition: A | Discharge status: Home / Self Care | Payer: 59 | Source: Ambulatory Visit | Attending: Pulmonary Disease | Admitting: Pulmonary Disease

## 2018-04-12 DIAGNOSIS — I519 Heart disease, unspecified: Secondary | ICD-10-CM | POA: Insufficient documentation

## 2018-04-12 DIAGNOSIS — R600 Localized edema: Secondary | ICD-10-CM | POA: Insufficient documentation

## 2018-04-12 NOTE — Progress Notes (Signed)
*  PRELIMINARY RESULTS* Echocardiogram 2D Echocardiogram has been performed.  Christina Cameron 04/12/2018, 11:08 AM

## 2018-06-03 DIAGNOSIS — E1165 Type 2 diabetes mellitus with hyperglycemia: Secondary | ICD-10-CM | POA: Diagnosis not present

## 2018-06-03 DIAGNOSIS — E785 Hyperlipidemia, unspecified: Secondary | ICD-10-CM | POA: Diagnosis not present

## 2018-06-03 DIAGNOSIS — I5032 Chronic diastolic (congestive) heart failure: Secondary | ICD-10-CM | POA: Diagnosis not present

## 2018-06-21 DIAGNOSIS — I5032 Chronic diastolic (congestive) heart failure: Secondary | ICD-10-CM | POA: Diagnosis not present

## 2018-06-21 DIAGNOSIS — E785 Hyperlipidemia, unspecified: Secondary | ICD-10-CM | POA: Diagnosis not present

## 2018-06-21 DIAGNOSIS — E1165 Type 2 diabetes mellitus with hyperglycemia: Secondary | ICD-10-CM | POA: Diagnosis not present

## 2018-06-21 LAB — LIPID PANEL
Cholesterol: 245 — AB (ref 0–200)
HDL: 42 (ref 35–70)
LDL Cholesterol: 169
Triglycerides: 185 — AB (ref 40–160)

## 2018-06-26 DIAGNOSIS — E785 Hyperlipidemia, unspecified: Secondary | ICD-10-CM | POA: Diagnosis not present

## 2018-06-26 DIAGNOSIS — J45909 Unspecified asthma, uncomplicated: Secondary | ICD-10-CM | POA: Diagnosis not present

## 2018-06-26 DIAGNOSIS — E1165 Type 2 diabetes mellitus with hyperglycemia: Secondary | ICD-10-CM | POA: Diagnosis not present

## 2018-07-01 ENCOUNTER — Ambulatory Visit (INDEPENDENT_AMBULATORY_CARE_PROVIDER_SITE_OTHER): Payer: 59 | Admitting: Cardiovascular Disease

## 2018-07-01 ENCOUNTER — Encounter: Payer: Self-pay | Admitting: Cardiovascular Disease

## 2018-07-01 VITALS — BP 140/84 | HR 90 | Ht 64.0 in | Wt 290.0 lb

## 2018-07-01 DIAGNOSIS — I471 Supraventricular tachycardia: Secondary | ICD-10-CM

## 2018-07-01 DIAGNOSIS — R600 Localized edema: Secondary | ICD-10-CM | POA: Diagnosis not present

## 2018-07-01 DIAGNOSIS — I519 Heart disease, unspecified: Secondary | ICD-10-CM | POA: Diagnosis not present

## 2018-07-01 DIAGNOSIS — I10 Essential (primary) hypertension: Secondary | ICD-10-CM | POA: Diagnosis not present

## 2018-07-01 DIAGNOSIS — R002 Palpitations: Secondary | ICD-10-CM

## 2018-07-01 DIAGNOSIS — E785 Hyperlipidemia, unspecified: Secondary | ICD-10-CM

## 2018-07-01 DIAGNOSIS — R0789 Other chest pain: Secondary | ICD-10-CM | POA: Diagnosis not present

## 2018-07-01 NOTE — Progress Notes (Signed)
SUBJECTIVE: The patient presents for routine follow-up.  Past medical history includes morbid obesity, noncardiac chest pain, shortness of breath, palpitations, hypertension, type 2 diabetes mellitus, and hyperlipidemia.  Echocardiogram 04/12/2018 showed normal left ventricular systolic function, LVEF 60 to 65%, mild LVH, and grade 2 diastolic dysfunction.  Cardiac catheterization on 12/14/2014 demonstrated angiographically normal coronary arteries and normal intracardiac pressures.  She has multiple somatic complaints today including palpitations, intermittent chest tightness, hand and feet swelling, and dizzy spells.  She said when she stands up she feels like she is going sideways.  She denies syncope.  She told me "I saw my PCP last week and my A1c is through the roof ".  She was prescribed Trulicity but has not started it yet.  She is currently taking metformin.  She had previously spoken to me about gastric sleeve surgery.  She said she was unable to undergo this due to an inadequate support system.      Review of Systems: As per "subjective", otherwise negative.  No Known Allergies  Current Outpatient Medications  Medication Sig Dispense Refill  . acetaminophen (TYLENOL) 500 MG tablet Take 500 mg by mouth every 4 (four) hours as needed for mild pain.    Marland Kitchen albuterol (PROVENTIL HFA;VENTOLIN HFA) 108 (90 Base) MCG/ACT inhaler Inhale 2 puffs into the lungs every 6 (six) hours as needed for wheezing or shortness of breath. 1 Inhaler 0  . furosemide (LASIX) 20 MG tablet Take 40 mg (2 Tablets)  Daily for 3 Days and Then Reduce to 20 mg (Tablets)  Daily. 90 tablet 3  . ibuprofen (ADVIL,MOTRIN) 200 MG tablet Take 400 mg by mouth daily as needed for mild pain.    . isosorbide mononitrate (IMDUR) 30 MG 24 hr tablet isosorbide mononitrate ER 30 mg tablet,extended release 24 hr  TAKE 1 TABLET BY MOUTH ONCE DAILY    . lisinopril (PRINIVIL,ZESTRIL) 20 MG tablet     . metFORMIN  (GLUCOPHAGE) 500 MG tablet Take 1 tablet (500 mg total) by mouth 2 (two) times daily with a meal. 60 tablet 1  . ranolazine (RANEXA) 500 MG 12 hr tablet Take 1 tablet (500 mg total) by mouth 2 (two) times daily. 60 tablet 6  . rosuvastatin (CRESTOR) 20 MG tablet Take 20 mg by mouth daily.      No current facility-administered medications for this visit.     Past Medical History:  Diagnosis Date  . Cancer ([place])    cervical 1989  . Chest pain    a. normal cors by cath in 11/2014  . Diabetes mellitus without complication ([place])   . Enlarged heart   . High cholesterol   . Hypertension   . SVT (supraventricular tachycardia) ([facility])    a. s/p ablation by Dr. Lovena Le in 2006.  . Tachycardia     Past Surgical History:  Procedure Laterality Date  . CARDIAC CATHETERIZATION    . CARDIAC CATHETERIZATION N/A 12/14/2014   Procedure: Right/Left Heart Cath and Coronary Angiography;  Surgeon: Sherren Mocha, MD;  Location: [facility];  Service: Cardiovascular;  Laterality: N/A;  . CARDIAC ELECTROPHYSIOLOGY STUDY AND ABLATION    . ENDOMETRIAL ABLATION      Social History   Socioeconomic History  . Marital status: Married    Spouse name: Not on file  . Number of children: Not on file  . Years of education: Not on file  . Highest education level: Not on file  Occupational History  . Not on  file  Social Needs  . Financial resource strain: Not on file  . Food insecurity:    Worry: Not on file    Inability: Not on file  . Transportation needs:    Medical: Not on file    Non-medical: Not on file  Tobacco Use  . Smoking status: Never Smoker  . Smokeless tobacco: Never Used  Substance and Sexual Activity  . Alcohol use: Yes    Comment: seldom  . Drug use: No  . Sexual activity: Yes    Birth control/protection: None  Lifestyle  . Physical activity:    Days per week: Not on file    Minutes per session: Not on file  . Stress: Not on file  Relationships  . Social connections:     Talks on phone: Not on file    Gets together: Not on file    Attends religious service: Not on file    Active member of club or organization: Not on file    Attends meetings of clubs or organizations: Not on file    Relationship status: Not on file  . Intimate partner violence:    Fear of current or ex partner: Not on file    Emotionally abused: Not on file    Physically abused: Not on file    Forced sexual activity: Not on file  Other Topics Concern  . Not on file  Social History Narrative  . Not on file     Vitals:   07/01/18 0853  BP: 140/84  Pulse: 90  SpO2: 98%  Weight: 290 lb (131.5 kg)  Height: 5\' 4"  (1.626 m)    Wt Readings from Last 3 Encounters:  07/01/18 290 lb (131.5 kg)  03/29/18 295 lb (133.8 kg)  01/21/18 285 lb (129.3 kg)     PHYSICAL EXAM General: NAD HEENT: Normal. Neck: No JVD, no thyromegaly. Lungs: Clear to auscultation bilaterally with normal respiratory effort. CV: Regular rate and rhythm, normal S1/S2, no S3/S4, no murmur. Trace pretibial and periankle edema b/l. Chronic venous stasis.    Abdomen: Soft, nontender, obese.  Neurologic: Alert and oriented.  Psych: Normal affect. Skin: Normal. Musculoskeletal: No gross deformities.    ECG: Reviewed above under Subjective   Labs: Lab Results  Component Value Date/Time   K 4.0 04/08/2018 12:43 PM   K 4.1 10/19/2011 09:19 AM   BUN 15 04/08/2018 12:43 PM   BUN 16 10/19/2011 09:19 AM   CREATININE 0.53 04/08/2018 12:43 PM   CREATININE 0.63 10/19/2011 09:19 AM   ALT 30 02/05/2015 01:15 PM   ALT 24 10/19/2011 09:19 AM   TSH 2.675 11/20/2014 10:00 AM   HGB 15.3 (H) 01/21/2018 08:45 AM   HGB 13.9 10/19/2011 09:19 AM     Lipids: No results found for: LDLCALC, LDLDIRECT, CHOL, TRIG, HDL     ASSESSMENT AND PLAN: 1.  Chest pain: Normal coronary arteries and intracardiac pressures. LV systolic function is normal as detailed above.  Currently on Ranexa 500 mg twice daily and she feels this  helps.  She does not take Imdur.  Chest pain is noncardiac.   2. Essential HTN: BP mildly elevated. No changes.  3. Hyperlipidemia: Currently on rosuvastatin 20 mg daily.  4. Grade 2 diastolic dysfunction/bilateral leg edema: Diastolic dysfunction is likely due to morbid obesity and poorly controlled type 2 diabetes mellitus.  No signs of frank diastolic decompensation.  Leg edema is likely due to morbid obesity and abdominal venous compression.  Diastolic dysfunction may also be playing  a role.  Echocardiogram from November 2019 reviewed above with grade 2 diastolic dysfunction.  Currently on Lasix 40 mg daily.   5. Morbid obesity: Needs aggressive weight loss.   6. Palpitations: Underwent SVT ablation in 2005.     Disposition: Follow up 1 year   Kate Sable, M.D., F.A.C.C.

## 2018-07-01 NOTE — Addendum Note (Signed)
Addended by: Barbarann Ehlers A on: 07/01/2018 09:26 AM   Modules accepted: Orders

## 2018-07-01 NOTE — Patient Instructions (Signed)
Medication Instructions:  Your physician recommends that you continue on your current medications as directed. Please refer to the Current Medication list given to you today.  If you need a refill on your cardiac medications before your next appointment, please call your pharmacy.   Lab work: None today If you have labs (blood work) drawn today and your tests are completely normal, you will receive your results only by: Marland Kitchen MyChart Message (if you have MyChart) OR . A paper copy in the mail If you have any lab test that is abnormal or we need to change your treatment, we will call you to review the results.  Testing/Procedures: None today  Follow-Up: At [facility], you and your health needs are our priority.  As part of our continuing mission to provide you with exceptional heart care, we have created designated Provider Care Teams.  These Care Teams include your primary Cardiologist (physician) and Advanced Practice Providers (APPs -  Physician Assistants and Nurse Practitioners) who all work together to provide you with the care you need, when you need it. You will need a follow up appointment in 12 months.  Please call our office 2 months in advance to schedule this appointment.  You may see Kate Sable, MD or one of the following Advanced Practice Providers on your designated Care Team:   Bernerd Pho, PA-C [facility]) . Ermalinda Barrios, PA-C ([place])  Any Other Special Instructions Will Be Listed Below (If Applicable). Please work on controlling your blood sugar.

## 2018-07-07 ENCOUNTER — Emergency Department (HOSPITAL_COMMUNITY): Payer: 59

## 2018-07-07 ENCOUNTER — Encounter (HOSPITAL_COMMUNITY): Payer: Self-pay | Admitting: Emergency Medicine

## 2018-07-07 ENCOUNTER — Emergency Department (HOSPITAL_COMMUNITY)
Admission: EM | Admit: 2018-07-07 | Discharge: 2018-07-07 | Disposition: A | Discharge status: Home / Self Care | Payer: 59 | Attending: Emergency Medicine | Admitting: Emergency Medicine

## 2018-07-07 ENCOUNTER — Other Ambulatory Visit: Payer: Self-pay

## 2018-07-07 DIAGNOSIS — E119 Type 2 diabetes mellitus without complications: Secondary | ICD-10-CM | POA: Insufficient documentation

## 2018-07-07 DIAGNOSIS — Z79899 Other long term (current) drug therapy: Secondary | ICD-10-CM | POA: Diagnosis not present

## 2018-07-07 DIAGNOSIS — I10 Essential (primary) hypertension: Secondary | ICD-10-CM | POA: Insufficient documentation

## 2018-07-07 DIAGNOSIS — Z7984 Long term (current) use of oral hypoglycemic drugs: Secondary | ICD-10-CM | POA: Insufficient documentation

## 2018-07-07 DIAGNOSIS — E1165 Type 2 diabetes mellitus with hyperglycemia: Secondary | ICD-10-CM | POA: Diagnosis not present

## 2018-07-07 DIAGNOSIS — R112 Nausea with vomiting, unspecified: Secondary | ICD-10-CM | POA: Diagnosis not present

## 2018-07-07 DIAGNOSIS — R42 Dizziness and giddiness: Secondary | ICD-10-CM | POA: Insufficient documentation

## 2018-07-07 LAB — CBC WITH DIFFERENTIAL/PLATELET
Abs Immature Granulocytes: 0.02 10*3/uL (ref 0.00–0.07)
Basophils Absolute: 0.1 10*3/uL (ref 0.0–0.1)
Basophils Relative: 1 %
Eosinophils Absolute: 0.2 10*3/uL (ref 0.0–0.5)
Eosinophils Relative: 3 %
HCT: 45.8 % (ref 36.0–46.0)
Hemoglobin: 15.2 g/dL — ABNORMAL HIGH (ref 12.0–15.0)
Immature Granulocytes: 0 %
Lymphocytes Relative: 38 %
Lymphs Abs: 2.6 10*3/uL (ref 0.7–4.0)
MCH: 29.3 pg (ref 26.0–34.0)
MCHC: 33.2 g/dL (ref 30.0–36.0)
MCV: 88.4 fL (ref 80.0–100.0)
Monocytes Absolute: 0.6 10*3/uL (ref 0.1–1.0)
Monocytes Relative: 9 %
Neutro Abs: 3.3 10*3/uL (ref 1.7–7.7)
Neutrophils Relative %: 49 %
Platelets: 218 10*3/uL (ref 150–400)
RBC: 5.18 MIL/uL — ABNORMAL HIGH (ref 3.87–5.11)
RDW: 13 % (ref 11.5–15.5)
WBC: 6.7 10*3/uL (ref 4.0–10.5)
nRBC: 0 % (ref 0.0–0.2)

## 2018-07-07 LAB — URINALYSIS, ROUTINE W REFLEX MICROSCOPIC
Bilirubin Urine: NEGATIVE
Glucose, UA: >=500 mg/dL — AB
Hgb urine dipstick: NEGATIVE
Leukocytes, UA: NEGATIVE
Nitrite: NEGATIVE
Protein, ur: NEGATIVE mg/dL
Specific Gravity, Urine: 1.02 (ref 1.005–1.030)
pH: 5.5 (ref 5.0–8.0)

## 2018-07-07 LAB — BASIC METABOLIC PANEL
Anion gap: 12 (ref 5–15)
BUN: 14 mg/dL (ref 6–20)
CO2: 25 mmol/L (ref 22–32)
Calcium: 9.3 mg/dL (ref 8.9–10.3)
Chloride: 99 mmol/L (ref 98–111)
Creatinine, Ser: 0.57 mg/dL (ref 0.44–1.00)
GFR calc Af Amer: >60 mL/min (ref >60)
GFR calc non Af Amer: >60 mL/min (ref >60)
Glucose, Bld: 332 mg/dL — ABNORMAL HIGH (ref 70–99)
Potassium: 4.2 mmol/L (ref 3.5–5.1)
Sodium: 136 mmol/L (ref 135–145)

## 2018-07-07 LAB — URINALYSIS, MICROSCOPIC (REFLEX)

## 2018-07-07 MED ORDER — MECLIZINE HCL 12.5 MG PO TABS
25.0000 mg | ORAL_TABLET | Freq: Once | ORAL | Status: AC
  Administered 2018-07-07: 25 mg via ORAL
  Filled 2018-07-07: qty 2

## 2018-07-07 MED ORDER — PROMETHAZINE HCL 25 MG/ML IJ SOLN
12.5000 mg | Freq: Once | INTRAMUSCULAR | Status: AC
  Administered 2018-07-07: 12.5 mg via INTRAVENOUS
  Filled 2018-07-07: qty 1

## 2018-07-07 MED ORDER — LORAZEPAM 2 MG/ML IJ SOLN
0.5000 mg | Freq: Once | INTRAMUSCULAR | Status: AC
  Administered 2018-07-07: 0.5 mg via INTRAVENOUS
  Filled 2018-07-07: qty 1

## 2018-07-07 MED ORDER — MECLIZINE HCL 12.5 MG PO TABS
ORAL_TABLET | ORAL | Status: AC
  Filled 2018-07-07: qty 1

## 2018-07-07 MED ORDER — SODIUM CHLORIDE 0.9 % IV BOLUS
500.0000 mL | Freq: Once | INTRAVENOUS | Status: AC
  Administered 2018-07-07: 500 mL via INTRAVENOUS

## 2018-07-07 MED ORDER — MECLIZINE HCL 12.5 MG PO TABS: 12.5000 mg | ORAL_TABLET | Freq: Three times a day (TID) | ORAL | 0 refills | Status: DC | PRN

## 2018-07-07 NOTE — ED Provider Notes (Signed)
[facility] EMERGENCY DEPARTMENT Provider Note   CSN: [id number] Arrival date & time: 07/07/18  5284     History   Chief Complaint No chief complaint on file.   HPI Christina Cameron is a 53 y.o. female.  HPI   53 year old female with dizziness.  Began last night before she went to bed.  Comes and goes.  She notices it when moving such as getting up from seated position or laying down.  She feels very unsteady.  She describes the room spinning around her.  This is made her feel nauseated.  She denies acute pain.  No tinnitus.  No loss of hearing.  No palpitations.  Past Medical History:  Diagnosis Date  . Cancer ([place])    cervical 1989  . Chest pain    a. normal cors by cath in 11/2014  . Diabetes mellitus without complication ([place])   . Enlarged heart   . High cholesterol   . Hypertension   . SVT (supraventricular tachycardia) ([facility])    a. s/p ablation by Dr. Lovena Le in 2006.  . Tachycardia     Patient Active Problem List   Diagnosis Date Noted  . DOE (dyspnea on exertion)   . Chest pain 09/08/2014  . Heart palpitations 08/04/2014  . DM (diabetes mellitus) ([place]) 08/04/2014    Past Surgical History:  Procedure Laterality Date  . CARDIAC CATHETERIZATION    . CARDIAC CATHETERIZATION N/A 12/14/2014   Procedure: Right/Left Heart Cath and Coronary Angiography;  Surgeon: Sherren Mocha, MD;  Location: [facility];  Service: Cardiovascular;  Laterality: N/A;  . CARDIAC ELECTROPHYSIOLOGY STUDY AND ABLATION    . ENDOMETRIAL ABLATION       OB History    Gravida  3   Para  2   Term  2   Preterm      AB  1   Living  2     SAB  1   TAB      Ectopic      Multiple      Live Births               Home Medications    Prior to Admission medications   Medication Sig Start Date End Date Taking? Authorizing Provider  acetaminophen (TYLENOL) 500 MG tablet Take 500 mg by mouth every 4 (four) hours as needed for mild pain.   Yes [provider]    albuterol (PROVENTIL HFA;VENTOLIN HFA) 108 (90 Base) MCG/ACT inhaler Inhale 2 puffs into the lungs every 6 (six) hours as needed for wheezing or shortness of breath. 10/15/17  Yes Amyot, Nicholes Stairs, NP  furosemide (LASIX) 20 MG tablet Take 40 mg (2 Tablets)  Daily for 3 Days and Then Reduce to 20 mg (Tablets)  Daily. Patient taking differently: Take 40 mg by mouth daily. Take 40 mg (2 Tablets)  Daily for 3 Days and Then Reduce to 20 mg (Tablets)  Daily. 03/29/18  Yes Strader, [place], PA-C  ibuprofen (ADVIL,MOTRIN) 200 MG tablet Take 400 mg by mouth daily as needed for mild pain.   Yes [provider]  lisinopril (PRINIVIL,ZESTRIL) 2.5 MG tablet Take 2.5 mg by mouth daily. 06/28/18  Yes [provider]  metFORMIN (GLUCOPHAGE) 500 MG tablet Take 1 tablet (500 mg total) by mouth 2 (two) times daily with a meal. 10/15/17  Yes Amyot, Nicholes Stairs, NP  ranolazine (RANEXA) 500 MG 12 hr tablet Take 1 tablet (500 mg total) by mouth 2 (two) times daily.  02/04/18  Yes Herminio Commons, MD  meclizine (ANTIVERT) 12.5 MG tablet Take 1 tablet (12.5 mg total) by mouth 3 (three) times daily as needed for dizziness. 07/07/18   Virgel Manifold, MD  rosuvastatin (CRESTOR) 20 MG tablet Take 20 mg by mouth daily.  06/26/18   [provider]    Family History Family History  Problem Relation Age of Onset  . Cancer Mother   . Cancer Father   . Stroke Brother   . Hypertension Brother     Social History Social History   Tobacco Use  . Smoking status: Never Smoker  . Smokeless tobacco: Never Used  Substance Use Topics  . Alcohol use: Yes    Comment: seldom  . Drug use: No     Allergies   Patient has no known allergies.   Review of Systems Review of Systems  All systems reviewed and negative, other than as noted in HPI.  Physical Exam Updated Vital Signs BP 113/80   Pulse 84   Temp (!) 97.5 F (36.4 C) (Oral)   Resp 20   Ht 5\' 4"  (1.626 m)   Wt 131 kg   SpO2 94%   BMI  49.57 kg/m   Physical Exam Vitals signs and nursing note reviewed.  Constitutional:      General: She is not in acute distress.    Appearance: She is well-developed.  HENT:     Head: Normocephalic and atraumatic.     Right Ear: Tympanic membrane and ear canal normal.     Left Ear: Tympanic membrane and ear canal normal.  Eyes:     General:        Right eye: No discharge.        Left eye: No discharge.     Extraocular Movements: Extraocular movements intact.     Conjunctiva/sclera: Conjunctivae normal.     Pupils: Pupils are equal, round, and reactive to light.     Comments: Horizontal nystagmus with fast phase to R  Neck:     Musculoskeletal: Neck supple.  Cardiovascular:     Rate and Rhythm: Normal rate and regular rhythm.     Heart sounds: Normal heart sounds. No murmur. No friction rub. No gallop.   Pulmonary:     Effort: Pulmonary effort is normal. No respiratory distress.     Breath sounds: Normal breath sounds.  Abdominal:     General: There is no distension.     Palpations: Abdomen is soft.     Tenderness: There is no abdominal tenderness.  Musculoskeletal:        General: No tenderness.  Skin:    General: Skin is warm and dry.  Neurological:     General: No focal deficit present.     Mental Status: She is alert and oriented to person, place, and time.     Cranial Nerves: No cranial nerve deficit.     Sensory: No sensory deficit.     Motor: No weakness.     Comments: Good finger to nose b/l.   Psychiatric:        Behavior: Behavior normal.        Thought Content: Thought content normal.      ED Treatments / Results  Labs (all labs ordered are listed, but only abnormal results are displayed) Labs Reviewed  CBC WITH DIFFERENTIAL/PLATELET - Abnormal; Notable for the following components:      Result Value   RBC 5.18 (*)    Hemoglobin 15.2 (*)  All other components within normal limits  BASIC METABOLIC PANEL - Abnormal; Notable for the following  components:   Glucose, Bld 332 (*)    All other components within normal limits  URINALYSIS, ROUTINE W REFLEX MICROSCOPIC - Abnormal; Notable for the following components:   Glucose, UA >=500 (*)    Ketones, ur TRACE (*)    All other components within normal limits  URINALYSIS, MICROSCOPIC (REFLEX) - Abnormal; Notable for the following components:   Bacteria, UA RARE (*)    All other components within normal limits    EKG EKG Interpretation  Date/Time:  Sunday July 07 2018 07:46:27 EST Ventricular Rate:  64 PR Interval:    QRS Duration: 94 QT Interval:  439 QTC Calculation: 453 R Axis:   -49 Text Interpretation:  Sinus rhythm Left anterior fascicular block Low voltage, precordial leads Anteroseptal infarct, old No significant change since last tracing Confirmed by Virgel Manifold [id number]) on 07/07/2018 7:50:57 AM   Radiology Ct Head Wo Contrast  Result Date: 07/07/2018 CLINICAL DATA:  Vertigo.  Intermittent dizziness EXAM: CT HEAD WITHOUT CONTRAST TECHNIQUE: Contiguous axial images were obtained from the base of the skull through the vertex without intravenous contrast. COMPARISON:  None. FINDINGS: Brain: No evidence of acute infarction, hemorrhage, hydrocephalus, extra-axial collection or mass lesion/mass effect. Vascular: No hyperdense vessel or unexpected calcification. Skull: Normal. Negative for fracture or focal lesion. Sinuses/Orbits: No acute finding. Clear mastoid and middle ear spaces. IMPRESSION: Negative head CT. Electronically Signed   By: Monte Fantasia M.D.   On: 07/07/2018 13:44    Procedures Procedures (including critical care time)  Medications Ordered in ED Medications  meclizine (ANTIVERT) 12.5 MG tablet (  Not Given 07/07/18 1444)  sodium chloride 0.9 % bolus 500 mL (0 mLs Intravenous Stopped 07/07/18 0942)  promethazine (PHENERGAN) injection 12.5 mg (12.5 mg Intravenous Given 07/07/18 0832)  meclizine (ANTIVERT) tablet 25 mg (25 mg Oral Given 07/07/18 0832)    LORazepam (ATIVAN) injection 0.5 mg (0.5 mg Intravenous Given 07/07/18 0833)  meclizine (ANTIVERT) tablet 25 mg (25 mg Oral Given 07/07/18 1444)  LORazepam (ATIVAN) injection 0.5 mg (0.5 mg Intravenous Given 07/07/18 1443)     Initial Impression / Assessment and Plan / ED Course  I have reviewed the triage vital signs and the nursing notes.  Pertinent labs & imaging results that were available during my care of the patient were reviewed by me and considered in my medical decision making (see chart for details).  I have reviewed the triage vital signs and the nursing notes. Prior records were reviewed for additional information.    Pertinent labs & imaging results that were available during my care of the patient were reviewed by me and considered in my medical decision making (see chart for details).  52yF with vertigo. Suspect this is peripheral. Positional. Reproducible. Neuro exam is nonfocal. Pt frustrated with continued symptoms despite meds in ED. She says this is new symptom but cardiology office note from 07/01/18 mentions:  "She has multiple somatic complaints today including palpitations, intermittent chest tightness, hand and feet swelling, and dizzy spells.  She said when she stands up she feels like she is going sideways."    This doesn't sound like orthostasis to me or near syncope. Tried to reassure. Instructions for Epley maneuvers provided. PRN meclizine. May need ENT FU for persistent symptoms.   Final Clinical Impressions(s) / ED Diagnoses   Final diagnoses:  Vertigo    ED Discharge Orders  Ordered    meclizine (ANTIVERT) 12.5 MG tablet  3 times daily PRN     07/07/18 1528           Virgel Manifold, MD 07/07/18 1600

## 2018-07-07 NOTE — ED Triage Notes (Signed)
intermittent dizziness since before she went to bed last night.  cbg 323. Pt A&o x 4. Denies any pain.

## 2018-07-07 NOTE — ED Notes (Signed)
Ambulated down hall.   Gait was very unsteady. Needed mod to max assist. Shuffled feet due to unsteady gait. C/o dizziness. Could not stand straight or steady.

## 2018-07-07 NOTE — ED Notes (Signed)
Pt ambulated to restroom with 2 person assist, gait unsteady

## 2018-08-09 ENCOUNTER — Emergency Department (HOSPITAL_COMMUNITY): Payer: 59

## 2018-08-09 ENCOUNTER — Emergency Department (HOSPITAL_COMMUNITY)
Admission: EM | Admit: 2018-08-09 | Discharge: 2018-08-09 | Disposition: A | Discharge status: Home / Self Care | Payer: 59 | Attending: Emergency Medicine | Admitting: Emergency Medicine

## 2018-08-09 ENCOUNTER — Other Ambulatory Visit: Payer: Self-pay

## 2018-08-09 ENCOUNTER — Encounter (HOSPITAL_COMMUNITY): Payer: Self-pay | Admitting: *Deleted

## 2018-08-09 DIAGNOSIS — S161XXA Strain of muscle, fascia and tendon at neck level, initial encounter: Secondary | ICD-10-CM

## 2018-08-09 DIAGNOSIS — Y999 Unspecified external cause status: Secondary | ICD-10-CM | POA: Insufficient documentation

## 2018-08-09 DIAGNOSIS — M542 Cervicalgia: Secondary | ICD-10-CM | POA: Diagnosis not present

## 2018-08-09 DIAGNOSIS — R079 Chest pain, unspecified: Secondary | ICD-10-CM | POA: Diagnosis not present

## 2018-08-09 DIAGNOSIS — Z7984 Long term (current) use of oral hypoglycemic drugs: Secondary | ICD-10-CM | POA: Diagnosis not present

## 2018-08-09 DIAGNOSIS — E119 Type 2 diabetes mellitus without complications: Secondary | ICD-10-CM | POA: Diagnosis not present

## 2018-08-09 DIAGNOSIS — Y939 Activity, unspecified: Secondary | ICD-10-CM | POA: Diagnosis not present

## 2018-08-09 DIAGNOSIS — Z79899 Other long term (current) drug therapy: Secondary | ICD-10-CM | POA: Insufficient documentation

## 2018-08-09 DIAGNOSIS — Y929 Unspecified place or not applicable: Secondary | ICD-10-CM | POA: Insufficient documentation

## 2018-08-09 DIAGNOSIS — M545 Low back pain: Secondary | ICD-10-CM | POA: Diagnosis not present

## 2018-08-09 DIAGNOSIS — S39012A Strain of muscle, fascia and tendon of lower back, initial encounter: Secondary | ICD-10-CM | POA: Diagnosis not present

## 2018-08-09 DIAGNOSIS — S199XXA Unspecified injury of neck, initial encounter: Secondary | ICD-10-CM | POA: Diagnosis present

## 2018-08-09 DIAGNOSIS — I10 Essential (primary) hypertension: Secondary | ICD-10-CM | POA: Insufficient documentation

## 2018-08-09 HISTORY — DX: Endocarditis, valve unspecified: I38

## 2018-08-09 HISTORY — DX: Unspecified osteoarthritis, unspecified site: M19.90

## 2018-08-09 MED ORDER — TRAMADOL HCL 50 MG PO TABS: 50.0000 mg | ORAL_TABLET | Freq: Four times a day (QID) | ORAL | 0 refills | Status: DC | PRN

## 2018-08-09 MED ORDER — CYCLOBENZAPRINE HCL 10 MG PO TABS: 10.0000 mg | ORAL_TABLET | Freq: Three times a day (TID) | ORAL | 0 refills | Status: DC

## 2018-08-09 MED ORDER — HYDROCODONE-ACETAMINOPHEN 5-325 MG PO TABS
2.0000 | ORAL_TABLET | Freq: Once | ORAL | Status: AC
  Administered 2018-08-09: 2 via ORAL
  Filled 2018-08-09: qty 2

## 2018-08-09 MED ORDER — ONDANSETRON HCL 4 MG PO TABS
4.0000 mg | ORAL_TABLET | Freq: Once | ORAL | Status: AC
  Administered 2018-08-09: 4 mg via ORAL
  Filled 2018-08-09: qty 1
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
## 2018-08-09 NOTE — Discharge Instructions (Addendum)
The CT scan of your neck is negative for fracture.  The examination favors muscle strain in this area.  The CT scan of your back is negative for fracture or dislocation.  The examination favors a muscle strain in this area as well.  The chest x-ray is negative for problems with your chest wall, or your lungs.  Please use Tylenol extra strength every 4 hours.  Use Flexeril 3 times daily for spasm.  Use Ultram for more severe pain.  Flexeril and Ultram may cause drowsiness.  Please do not drive a vehicle, operate machinery, drink alcohol, or participate in activities requiring concentration when taking either these medications.  Please see your primary physician or return to the emergency department if any changes in your condition, worsening of your symptoms, problems, or concerns.

## 2018-08-15 DIAGNOSIS — E1165 Type 2 diabetes mellitus with hyperglycemia: Secondary | ICD-10-CM | POA: Diagnosis not present

## 2018-08-15 DIAGNOSIS — J45909 Unspecified asthma, uncomplicated: Secondary | ICD-10-CM | POA: Diagnosis not present

## 2018-08-15 DIAGNOSIS — M545 Low back pain: Secondary | ICD-10-CM | POA: Diagnosis not present

## 2018-09-13 DIAGNOSIS — M25561 Pain in right knee: Secondary | ICD-10-CM | POA: Diagnosis not present

## 2018-10-12 DIAGNOSIS — K047 Periapical abscess without sinus: Secondary | ICD-10-CM | POA: Diagnosis not present

## 2018-10-14 DIAGNOSIS — M545 Low back pain: Secondary | ICD-10-CM | POA: Diagnosis not present

## 2018-10-14 DIAGNOSIS — E1165 Type 2 diabetes mellitus with hyperglycemia: Secondary | ICD-10-CM | POA: Diagnosis not present

## 2018-10-14 DIAGNOSIS — J45909 Unspecified asthma, uncomplicated: Secondary | ICD-10-CM | POA: Diagnosis not present

## 2018-10-15 DIAGNOSIS — G4733 Obstructive sleep apnea (adult) (pediatric): Secondary | ICD-10-CM | POA: Diagnosis not present

## 2018-10-15 DIAGNOSIS — J45909 Unspecified asthma, uncomplicated: Secondary | ICD-10-CM | POA: Diagnosis not present

## 2018-11-14 ENCOUNTER — Telehealth: Payer: Self-pay

## 2018-11-14 DIAGNOSIS — Z20822 Contact with and (suspected) exposure to covid-19: Secondary | ICD-10-CM

## 2018-11-14 NOTE — Telephone Encounter (Addendum)
Joy, LPN at Dr. Luan Pulling office called and requested the patient for covid testing. I called the patient and advised of the request. Appointment scheduled for tomorrow, 11/15/18 at [facility], advised of location and to wear a mask for everyone in the vehicle, she verbalized understanding. Order placed.  Referral from Dr. Sinda Du Phone: [phone] Fax: [phone] Office hours: Monday-Thursday 0800-1500

## 2018-11-15 ENCOUNTER — Other Ambulatory Visit: Payer: 59

## 2018-11-15 DIAGNOSIS — Z20822 Contact with and (suspected) exposure to covid-19: Secondary | ICD-10-CM

## 2018-11-18 LAB — NOVEL CORONAVIRUS, NAA: SARS-CoV-2, NAA: NOT DETECTED

## 2019-03-13 ENCOUNTER — Other Ambulatory Visit: Payer: Self-pay

## 2019-03-13 DIAGNOSIS — Z20822 Contact with and (suspected) exposure to covid-19: Secondary | ICD-10-CM

## 2019-03-14 LAB — NOVEL CORONAVIRUS, NAA: SARS-CoV-2, NAA: NOT DETECTED

## 2019-04-15 LAB — BASIC METABOLIC PANEL
BUN: 16 (ref 4–21)
CO2: 31 — AB (ref 13–22)
Chloride: 98 — AB (ref 99–108)
Creatinine: 0.6 (ref 0.5–1.1)
Glucose: 297
Potassium: 4.6 (ref 3.4–5.3)
Sodium: 137 (ref 137–147)

## 2019-04-15 LAB — COMPREHENSIVE METABOLIC PANEL
Albumin: 4.2 (ref 3.5–5.0)
Calcium: 9.2 (ref 8.7–10.7)
GFR calc Af Amer: 122
GFR calc non Af Amer: 105
Globulin: 2.8

## 2019-04-15 LAB — HEPATIC FUNCTION PANEL
ALT: 22 (ref 7–35)
AST: 20 (ref 13–35)
Alkaline Phosphatase: 47 (ref 25–125)

## 2019-04-16 LAB — HEMOGLOBIN A1C
Hemoglobin A1C: 10.3
Hemoglobin A1C: 10.3

## 2019-05-04 DIAGNOSIS — E785 Hyperlipidemia, unspecified: Secondary | ICD-10-CM

## 2019-05-04 DIAGNOSIS — J45909 Unspecified asthma, uncomplicated: Secondary | ICD-10-CM

## 2019-05-04 DIAGNOSIS — M179 Osteoarthritis of knee, unspecified: Secondary | ICD-10-CM

## 2019-05-04 DIAGNOSIS — I7300 Raynaud's syndrome without gangrene: Secondary | ICD-10-CM | POA: Insufficient documentation

## 2019-05-04 DIAGNOSIS — R002 Palpitations: Secondary | ICD-10-CM

## 2019-05-04 DIAGNOSIS — R400 Somnolence: Secondary | ICD-10-CM

## 2019-05-04 DIAGNOSIS — E1165 Type 2 diabetes mellitus with hyperglycemia: Secondary | ICD-10-CM

## 2019-05-04 DIAGNOSIS — E782 Mixed hyperlipidemia: Secondary | ICD-10-CM | POA: Insufficient documentation

## 2019-05-04 DIAGNOSIS — J209 Acute bronchitis, unspecified: Secondary | ICD-10-CM

## 2019-05-04 DIAGNOSIS — I5032 Chronic diastolic (congestive) heart failure: Secondary | ICD-10-CM

## 2019-05-07 ENCOUNTER — Ambulatory Visit (INDEPENDENT_AMBULATORY_CARE_PROVIDER_SITE_OTHER): Payer: Medicaid Other | Admitting: "Endocrinology

## 2019-05-07 ENCOUNTER — Encounter: Payer: Self-pay | Admitting: "Endocrinology

## 2019-05-07 ENCOUNTER — Other Ambulatory Visit: Payer: Self-pay

## 2019-05-07 VITALS — BP 130/87 | HR 86 | Ht 65.0 in | Wt 285.0 lb

## 2019-05-07 DIAGNOSIS — E782 Mixed hyperlipidemia: Secondary | ICD-10-CM

## 2019-05-07 DIAGNOSIS — I10 Essential (primary) hypertension: Secondary | ICD-10-CM | POA: Insufficient documentation

## 2019-05-07 DIAGNOSIS — E1165 Type 2 diabetes mellitus with hyperglycemia: Secondary | ICD-10-CM

## 2019-05-07 MED ORDER — ROSUVASTATIN CALCIUM 20 MG PO TABS: 20.0000 mg | ORAL_TABLET | Freq: Every day | ORAL | 3 refills | Status: DC

## 2019-05-07 MED ORDER — LANTUS SOLOSTAR 100 UNIT/ML ~~LOC~~ SOPN: 30.0000 [IU] | PEN_INJECTOR | Freq: Every day | SUBCUTANEOUS | 2 refills | Status: DC

## 2019-05-07 MED ORDER — BD PEN NEEDLE SHORT U/F 31G X 8 MM MISC: 1.0000 | 3 refills | Status: DC

## 2019-05-07 MED ORDER — LISINOPRIL 5 MG PO TABS: 5.0000 mg | ORAL_TABLET | Freq: Every day | ORAL | 2 refills | Status: DC

## 2019-05-07 NOTE — Progress Notes (Signed)
Endocrinology Consult Note       05/07/2019, 3:30 PM   Subjective:    Patient ID: Christina Cameron, female    DOB: Aug 05, 1965.  Christina Cameron is being seen in consultation for management of currently uncontrolled symptomatic diabetes requested by  Sinda Du, MD.   Past Medical History:  Diagnosis Date  . Acute bronchitis, unspecified   . Arthritis    right knee  . Cancer ([place])    cervical 1989  . Chest pain    a. normal cors by cath in 11/2014  . Chronic diastolic (congestive) heart failure ([place])   . Diabetes mellitus without complication ([place])   . Enlarged heart   . High cholesterol   . Hyperlipidemia, unspecified   . Hypertension   . Leaky heart valve   . Morbid (severe) obesity due to excess calories ([place])   . Osteoarthritis of knee, unspecified   . Palpitations   . Raynaud's syndrome   . Somnolence   . SVT (supraventricular tachycardia) ([facility])    a. s/p ablation by Dr. Lovena Le in 2006.  . Tachycardia   . Type 2 diabetes mellitus with hyperglycemia ([place])   . Unspecified asthma, uncomplicated     Past Surgical History:  Procedure Laterality Date  . CARDIAC CATHETERIZATION    . CARDIAC CATHETERIZATION N/A 12/14/2014   Procedure: Right/Left Heart Cath and Coronary Angiography;  Surgeon: Sherren Mocha, MD;  Location: [facility];  Service: Cardiovascular;  Laterality: N/A;  . CARDIAC ELECTROPHYSIOLOGY STUDY AND ABLATION    . CESAREAN SECTION     x2  . ENDOMETRIAL ABLATION      Social History   Socioeconomic History  . Marital status: Married    Spouse name: Not on file  . Number of children: Not on file  . Years of education: Not on file  . Highest education level: Not on file  Occupational History  . Not on file  Social Needs  . Financial resource strain: Not on file  . Food insecurity    Worry: Not on file    Inability: Not on file  . Transportation needs   Medical: Not on file    Non-medical: Not on file  Tobacco Use  . Smoking status: Never Smoker  . Smokeless tobacco: Never Used  Substance and Sexual Activity  . Alcohol use: Yes    Comment: seldom  . Drug use: No  . Sexual activity: Yes    Birth control/protection: None  Lifestyle  . Physical activity    Days per week: Not on file    Minutes per session: Not on file  . Stress: Not on file  Relationships  . Social Herbalist on phone: Not on file    Gets together: Not on file    Attends religious service: Not on file    Active member of club or organization: Not on file    Attends meetings of clubs or organizations: Not on file    Relationship status: Not on file  Other Topics Concern  . Not on file  Social History Narrative  . Not on file  Family History  Problem Relation Age of Onset  . Cancer Mother   . Cancer Father   . Stroke Brother   . Hypertension Brother     Outpatient Encounter Medications as of 05/07/2019  Medication Sig  . desvenlafaxine (PRISTIQ) 50 MG 24 hr tablet Take 50 mg by mouth daily.  . Dulaglutide (TRULICITY) 1.5 0000000 SOPN Inject into the skin once a week.  . furosemide (LASIX) 20 MG tablet Take 20 mg by mouth daily as needed.  . metFORMIN (GLUCOPHAGE) 500 MG tablet Take 1 tablet (500 mg total) by mouth 2 (two) times daily with a meal.  . montelukast (SINGULAIR) 10 MG tablet Take 10 mg by mouth at bedtime.  . ranolazine (RANEXA) 500 MG 12 hr tablet Take 500 mg by mouth 2 (two) times daily.  . [DISCONTINUED] furosemide (LASIX) 20 MG tablet Take 40 mg (2 Tablets)  Daily for 3 Days and Then Reduce to 20 mg (Tablets)  Daily. (Patient taking differently: Take 40 mg by mouth daily. )  . [DISCONTINUED] glipiZIDE (GLUCOTROL) 5 MG tablet Take 5 mg by mouth 2 (two) times daily before a meal.  . Insulin Glargine (LANTUS SOLOSTAR) 100 UNIT/ML Solostar Pen Inject 30 Units into the skin at bedtime.  . Insulin Pen Needle (B-D ULTRAFINE III SHORT  PEN) 31G X 8 MM MISC 1 each by Does not apply route as directed.  Marland Kitchen lisinopril (ZESTRIL) 5 MG tablet Take 1 tablet (5 mg total) by mouth daily.  . rosuvastatin (CRESTOR) 20 MG tablet Take 1 tablet (20 mg total) by mouth daily.  . [DISCONTINUED] acetaminophen (TYLENOL) 500 MG tablet Take 1,000 mg by mouth as needed for mild pain.   . [DISCONTINUED] albuterol (PROVENTIL HFA;VENTOLIN HFA) 108 (90 Base) MCG/ACT inhaler Inhale 2 puffs into the lungs every 6 (six) hours as needed for wheezing or shortness of breath.  . [DISCONTINUED] cyclobenzaprine (FLEXERIL) 10 MG tablet Take 1 tablet (10 mg total) by mouth 3 (three) times daily.  . [DISCONTINUED] lisinopril (PRINIVIL,ZESTRIL) 2.5 MG tablet Take 2.5 mg by mouth daily.  . [DISCONTINUED] meclizine (ANTIVERT) 12.5 MG tablet Take 1 tablet (12.5 mg total) by mouth 3 (three) times daily as needed for dizziness.  . [DISCONTINUED] ranolazine (RANEXA) 500 MG 12 hr tablet Take 1 tablet (500 mg total) by mouth 2 (two) times daily.  . [DISCONTINUED] rosuvastatin (CRESTOR) 20 MG tablet Take 20 mg by mouth daily.   . [DISCONTINUED] traMADol (ULTRAM) 50 MG tablet Take 1 tablet (50 mg total) by mouth every 6 (six) hours as needed.   No facility-administered encounter medications on file as of 05/07/2019.     ALLERGIES: No Known Allergies  VACCINATION STATUS: Immunization History  Administered Date(s) Administered  . Influenza-Unspecified 03/02/2016, 04/16/2019    Diabetes She presents for her initial diabetic visit. She has type 2 diabetes mellitus. Onset time: She was diagnosed at approximate age of 22 years.  She had history of gestational diabetes at approximate age of 18. Her disease course has been worsening. There are no hypoglycemic associated symptoms. Pertinent negatives for hypoglycemia include no confusion, headaches, pallor or seizures. Associated symptoms include fatigue, polydipsia and polyuria. Pertinent negatives for diabetes include no chest  pain and no polyphagia. There are no hypoglycemic complications. Symptoms are worsening. Diabetic complications include peripheral neuropathy. Risk factors for coronary artery disease include diabetes mellitus, dyslipidemia, family history, hypertension, obesity, sedentary lifestyle and post-menopausal. Current diabetic treatments: She is currently on Metformin 1 g twice daily, Trulicity 1.5 mg weekly, glipizide 5  mg p.o. twice daily. Her weight is fluctuating minimally. She is following a generally unhealthy diet. When asked about meal planning, she reported none. She has not had a previous visit with a dietitian. She rarely participates in exercise. (She did not bring any logs nor meter to review.  Her most recent A1c was 10.3%, prior to which it was 10.1%.) An ACE inhibitor/angiotensin II receptor blocker is not being taken. Eye exam is current.  Hyperlipidemia This is a chronic problem. The current episode started more than 1 year ago. The problem is uncontrolled. Exacerbating diseases include diabetes and obesity. Pertinent negatives include no chest pain, myalgias or shortness of breath. She is currently on no antihyperlipidemic treatment. Risk factors for coronary artery disease include diabetes mellitus, dyslipidemia, family history, hypertension, obesity, a sedentary lifestyle and post-menopausal.  Hypertension This is a chronic problem. The current episode started more than 1 year ago. Pertinent negatives include no chest pain, headaches, palpitations or shortness of breath. Risk factors for coronary artery disease include dyslipidemia, diabetes mellitus, obesity, sedentary lifestyle, post-menopausal state and family history. Past treatments include nothing.     Review of Systems  Constitutional: Positive for fatigue. Negative for chills, fever and unexpected weight change.  HENT: Negative for trouble swallowing and voice change.   Eyes: Negative for visual disturbance.  Respiratory: Negative  for cough, shortness of breath and wheezing.   Cardiovascular: Negative for chest pain, palpitations and leg swelling.  Gastrointestinal: Negative for diarrhea, nausea and vomiting.  Endocrine: Positive for polydipsia and polyuria. Negative for cold intolerance, heat intolerance and polyphagia.  Musculoskeletal: Negative for arthralgias and myalgias.  Skin: Negative for color change, pallor, rash and wound.  Neurological: Negative for seizures and headaches.  Psychiatric/Behavioral: Negative for confusion and suicidal ideas.    Objective:    BP 130/87   Pulse 86   Ht 5\' 5"  (1.651 m)   Wt 285 lb (129.3 kg)   BMI 47.43 kg/m   Wt Readings from Last 3 Encounters:  05/07/19 285 lb (129.3 kg)  08/09/18 285 lb (129.3 kg)  07/07/18 288 lb 12.8 oz (131 kg)     Physical Exam Constitutional:      Appearance: She is well-developed.  HENT:     Head: Normocephalic and atraumatic.  Neck:     Musculoskeletal: Normal range of motion and neck supple.     Thyroid: No thyromegaly.     Trachea: No tracheal deviation.  Cardiovascular:     Rate and Rhythm: Normal rate.  Pulmonary:     Effort: Pulmonary effort is normal.  Abdominal:     Tenderness: There is no abdominal tenderness. There is no guarding.  Musculoskeletal: Normal range of motion.  Skin:    General: Skin is warm and dry.     Coloration: Skin is not pale.     Findings: No erythema or rash.  Neurological:     Mental Status: She is alert and oriented to person, place, and time.     Cranial Nerves: No cranial nerve deficit.     Coordination: Coordination normal.     Deep Tendon Reflexes: Reflexes are normal and symmetric.  Psychiatric:        Judgment: Judgment normal.     CMP ( most recent) CMP     Component Value Date/Time   NA 137 04/15/2019   NA 140 10/19/2011 0919   K 4.6 04/15/2019   K 4.1 10/19/2011 0919   CL 98 (A) 04/15/2019   CL 104 10/19/2011 0919  CO2 31 (A) 04/15/2019   CO2 24 10/19/2011 0919    GLUCOSE 332 (H) 07/07/2018 0748   GLUCOSE 104 (H) 10/19/2011 0919   BUN 16 04/15/2019   BUN 16 10/19/2011 0919   CREATININE 0.6 04/15/2019   CREATININE 0.57 07/07/2018 0748   CREATININE 0.63 10/19/2011 0919   CALCIUM 9.2 04/15/2019   CALCIUM 8.5 10/19/2011 0919   PROT 7.5 02/05/2015 1315   PROT 7.6 10/19/2011 0919   ALBUMIN 4.2 04/15/2019   ALBUMIN 4.2 10/19/2011 0919   ALBUMIN 4.1 10/19/2011 0919   AST 20 04/15/2019   AST 19 10/19/2011 0919   ALT 22 04/15/2019   ALT 24 10/19/2011 0919   ALKPHOS 47 04/15/2019   ALKPHOS 57 10/19/2011 0919   BILITOT 0.8 02/05/2015 1315   BILITOT 0.7 10/19/2011 0919   GFRNONAA 105 04/15/2019   GFRNONAA >60 10/19/2011 0919   GFRAA 122 04/15/2019   GFRAA >60 10/19/2011 0919     Diabetic Labs (most recent): Lab Results  Component Value Date   HGBA1C 10.3 04/16/2019   HGBA1C 10.3 04/15/2019   HGBA1C 8.7 (H) 11/20/2014     Lipid Panel ( most recent) Lipid Panel     Component Value Date/Time   CHOL 245 (A) 06/21/2018   TRIG 185 (A) 06/21/2018   HDL 42 06/21/2018   LDLCALC 169 06/21/2018      Lab Results  Component Value Date   TSH 2.675 11/20/2014   TSH 1.62 10/19/2011      Assessment & Plan:   1. Type 2 diabetes mellitus with hyperglycemia, unspecified whether long term insulin use ([place])  - Christina Cameron has currently uncontrolled symptomatic type 2 DM since 53 years of age,  with most recent A1c of 10.3%. Recent labs reviewed. - I had a long discussion with her about the progressive nature of diabetes and the pathology behind its complications. -her diabetes is complicated by obesity/sedentary life, peripheral neuropathy and she remains at a high risk for more acute and chronic complications which include CAD, CVA, CKD, retinopathy, and neuropathy. These are all discussed in detail with her.  - I have counseled her on diet  and weight management  by adopting a carbohydrate restricted/protein rich diet. Patient is encouraged to  switch to  unprocessed or minimally processed     complex starch and increased protein intake (animal or plant source), fruits, and vegetables. -  she is advised to stick to a routine mealtimes to eat 3 meals  a day and avoid unnecessary snacks ( to snack only to correct hypoglycemia).   - she admits that there is a room for improvement in her food and drink choices. - Suggestion is made for her to avoid simple carbohydrates  from her diet including Cakes, Sweet Desserts, Ice Cream, Soda (diet and regular), Sweet Tea, Candies, Chips, Cookies, Store Bought Juices, Alcohol in Excess of  1-2 drinks a day, Artificial Sweeteners,  Coffee Creamer, and "Sugar-free" Products. This will help patient to have more stable blood glucose profile and potentially avoid unintended weight gain.  - she will be scheduled with Jearld Fenton, RDN, CDE for diabetes education.  - I have approached her with the following individualized plan to manage  her diabetes and patient agrees:   -Given her current and prevailing glycemic burden, she will need at least basal insulin in order for her to achieve control of diabetes to target. - she will be initiated on basal insulin Lantus (sample of Tyler Aas  is given from clinic)  30 units daily at bedtime    associated with strict monitoring of glucose 4 times a day-before meals and at bedtime.  She will be on the phone visit in 10 days to evaluate. - she is warned not to take insulin without proper monitoring per orders.  - she is encouraged to call clinic for blood glucose levels less than 70 or above 300 mg /dl. - she is advised to continue Trulicity 1.5 mg subcutaneously weekly, Metformin 1 g p.o. twice daily, therapeutically suitable for patient . - her glipizide will be discontinued, risk outweighs benefit for this patient.  - Specific targets for  A1c;  LDL, HDL, Triglycerides, and  Waist Circumference were discussed with the patient.  2) Blood Pressure /Hypertension:  her  blood pressure is  controlled to target.  She will benefit from a low-dose ACE inhibitor treatment.  I discussed and initiated lisinopril 5 mg p.o. daily at breakfast.    . 3) Lipids/Hyperlipidemia:   Review of her recent lipid panel showed un controlled  LDL at 169 .  she has taken Crestor in the past, not on it for unclear reasons.  I discussed and reinitiated Crestor 20 mg p.o. nightly.   Side effects and precautions discussed with her.  4)  Weight/Diet:  Body mass index is 47.43 kg/m.  -   clearly complicating her diabetes care.   she is  a candidate for weight loss. I discussed with her the fact that loss of 5 - 10% of her  current body weight will have the most impact on her diabetes management.  Exercise, and detailed carbohydrates information provided  -  detailed on discharge instructions.  She is a good candidate for weight loss surgery, will be discussed on subsequent visits.  5) Chronic Care/Health Maintenance:  -she  is on ACEI/ARB and Statin medications and  is encouraged to initiate and continue to follow up with Ophthalmology, Dentist,  Podiatrist at least yearly or according to recommendations, and advised to  stay away from smoking. I have recommended yearly flu vaccine and pneumonia vaccine at least every 5 years; moderate intensity exercise for up to 150 minutes weekly; and  sleep for at least 7 hours a day.  - she is  advised to maintain close follow up with Sinda Du, MD for primary care needs, as well as her other providers for optimal and coordinated care.  - Time spent with the patient: 45 minutes, of which >50% was spent in obtaining information about her symptoms, reviewing her previous labs/studies, evaluations, and treatments, counseling her about her currently uncontrolled, complicated type 2 diabetes; hyperlipidemia; hypertension, and developing plans for long term treatment based on the latest standards of care/guidelines.  Please refer to " Patient Self  Inventory" in the Media  tab for reviewed elements of pertinent patient history.  Wallie Char participated in the discussions, expressed understanding, and voiced agreement with the above plans.  All questions were answered to her satisfaction. she is encouraged to contact clinic should she have any questions or concerns prior to her return visit.  Follow up plan: - Return in about 10 days (around 05/17/2019) for Follow up with Meter and Logs Only - no Labs.  Glade Lloyd, MD [facility] [address] [place], [place] [address] Phone: [phone]  Fax: [phone]    05/07/2019, 3:30 PM  This note was partially dictated with voice recognition software. Similar sounding words can be transcribed inadequately or may not  be  corrected upon review.

## 2019-05-07 NOTE — Patient Instructions (Signed)

## 2019-05-20 ENCOUNTER — Ambulatory Visit (INDEPENDENT_AMBULATORY_CARE_PROVIDER_SITE_OTHER): Payer: Medicaid Other | Admitting: "Endocrinology

## 2019-05-20 ENCOUNTER — Encounter: Payer: Self-pay | Admitting: "Endocrinology

## 2019-05-20 ENCOUNTER — Other Ambulatory Visit: Payer: Self-pay

## 2019-05-20 DIAGNOSIS — E782 Mixed hyperlipidemia: Secondary | ICD-10-CM

## 2019-05-20 DIAGNOSIS — I10 Essential (primary) hypertension: Secondary | ICD-10-CM

## 2019-05-20 DIAGNOSIS — E1165 Type 2 diabetes mellitus with hyperglycemia: Secondary | ICD-10-CM

## 2019-05-20 MED ORDER — LANTUS SOLOSTAR 100 UNIT/ML ~~LOC~~ SOPN: 40.0000 [IU] | PEN_INJECTOR | Freq: Every day | SUBCUTANEOUS | 2 refills | Status: DC

## 2019-05-20 NOTE — Progress Notes (Signed)
05/20/2019, 8:53 AM                                                     Endocrinology Telehealth Visit Follow up Note -During COVID -19 Pandemic  This visit type was conducted due to national recommendations for restrictions regarding the COVID-19 Pandemic  in an effort to limit this patient's exposure and mitigate transmission of the corona virus.  Due to her co-morbid illnesses, Christina Cameron is at  moderate to high risk for complications without adequate follow up.  This format is felt to be most appropriate for her at this time.  I connected with this patient on 05/20/2019   by telephone and verified that I am speaking with the correct person using two identifiers. Christina Cameron, Oct 09, 1965. she has verbally consented to this visit. All issues noted in this document were discussed and addressed. The format was not optimal for physical exam.   Subjective:    Patient ID: Christina Cameron, female    DOB: 1966/05/01.  Christina Cameron is being engaged in telehealth via telephone in follow-up after she was seen in consultation for management of currently uncontrolled symptomatic diabetes requested by  Sinda Du, MD.   Past Medical History:  Diagnosis Date  . Acute bronchitis, unspecified   . Arthritis    right knee  . Cancer ([place])    cervical 1989  . Chest pain    a. normal cors by cath in 11/2014  . Chronic diastolic (congestive) heart failure ([place])   . Diabetes mellitus without complication ([place])   . Enlarged heart   . High cholesterol   . Hyperlipidemia, unspecified   . Hypertension   . Leaky heart valve   . Morbid (severe) obesity due to excess calories ([place])   . Osteoarthritis of knee, unspecified   . Palpitations   . Raynaud's syndrome   . Somnolence   . SVT (supraventricular tachycardia) ([facility])    a. s/p ablation by Dr. Lovena Le in 2006.  . Tachycardia   . Type 2 diabetes mellitus with hyperglycemia ([place])   . Unspecified asthma, uncomplicated     Past  Surgical History:  Procedure Laterality Date  . CARDIAC CATHETERIZATION    . CARDIAC CATHETERIZATION N/A 12/14/2014   Procedure: Right/Left Heart Cath and Coronary Angiography;  Surgeon: Sherren Mocha, MD;  Location: [facility];  Service: Cardiovascular;  Laterality: N/A;  . CARDIAC ELECTROPHYSIOLOGY STUDY AND ABLATION    . CESAREAN SECTION     x2  . ENDOMETRIAL ABLATION      Social History   Socioeconomic History  . Marital status: Married    Spouse name: Not on file  . Number of children: Not on file  . Years of education: Not on file  . Highest education level: Not on file  Occupational History  . Not on file  Tobacco Use  . Smoking status: Never Smoker  . Smokeless tobacco: Never Used  Substance and Sexual Activity  . Alcohol use: Yes    Comment: seldom  . Drug use: No  . Sexual activity: Yes    Birth control/protection: None  Other Topics Concern  . Not on file  Social History Narrative  . Not on file   Social Determinants of Health   Financial Resource Strain:   .  Difficulty of Paying Living Expenses: Not on file  Food Insecurity:   . Worried About Charity fundraiser in the Last Year: Not on file  . Ran Out of Food in the Last Year: Not on file  Transportation Needs:   . Lack of Transportation (Medical): Not on file  . Lack of Transportation (Non-Medical): Not on file  Physical Activity:   . Days of Exercise per Week: Not on file  . Minutes of Exercise per Session: Not on file  Stress:   . Feeling of Stress : Not on file  Social Connections:   . Frequency of Communication with Friends and Family: Not on file  . Frequency of Social Gatherings with Friends and Family: Not on file  . Attends Religious Services: Not on file  . Active Member of Clubs or Organizations: Not on file  . Attends Archivist Meetings: Not on file  . Marital Status: Not on file    Family History  Problem Relation Age of Onset  . Cancer Mother   . Cancer Father    . Stroke Brother   . Hypertension Brother     Outpatient Encounter Medications as of 05/20/2019  Medication Sig  . desvenlafaxine (PRISTIQ) 50 MG 24 hr tablet Take 50 mg by mouth daily.  . Dulaglutide (TRULICITY) 1.5 0000000 SOPN Inject into the skin once a week.  . furosemide (LASIX) 20 MG tablet Take 20 mg by mouth daily as needed.  . Insulin Glargine (LANTUS SOLOSTAR) 100 UNIT/ML Solostar Pen Inject 40 Units into the skin at bedtime.  . Insulin Pen Needle (B-D ULTRAFINE III SHORT PEN) 31G X 8 MM MISC 1 each by Does not apply route as directed.  Marland Kitchen lisinopril (ZESTRIL) 5 MG tablet Take 1 tablet (5 mg total) by mouth daily.  . metFORMIN (GLUCOPHAGE) 500 MG tablet Take 1 tablet (500 mg total) by mouth 2 (two) times daily with a meal.  . montelukast (SINGULAIR) 10 MG tablet Take 10 mg by mouth at bedtime.  . ranolazine (RANEXA) 500 MG 12 hr tablet Take 500 mg by mouth 2 (two) times daily.  . rosuvastatin (CRESTOR) 20 MG tablet Take 1 tablet (20 mg total) by mouth daily.  . [DISCONTINUED] Insulin Glargine (LANTUS SOLOSTAR) 100 UNIT/ML Solostar Pen Inject 30 Units into the skin at bedtime.   No facility-administered encounter medications on file as of 05/20/2019.    ALLERGIES: No Known Allergies  VACCINATION STATUS: Immunization History  Administered Date(s) Administered  . Influenza-Unspecified 03/02/2016, 04/16/2019    Diabetes She presents for her follow-up diabetic visit. She has type 2 diabetes mellitus. Onset time: She was diagnosed at approximate age of 56 years.  She had history of gestational diabetes at approximate age of 43. Her disease course has been worsening. There are no hypoglycemic associated symptoms. Pertinent negatives for hypoglycemia include no confusion, headaches, pallor or seizures. Associated symptoms include fatigue, polydipsia and polyuria. Pertinent negatives for diabetes include no chest pain and no polyphagia. There are no hypoglycemic complications.  Symptoms are improving. Diabetic complications include peripheral neuropathy. Risk factors for coronary artery disease include diabetes mellitus, dyslipidemia, family history, hypertension, obesity, sedentary lifestyle and post-menopausal. Current diabetic treatments: She is currently on Metformin 1 g twice daily, Trulicity 1.5 mg weekly, glipizide 5 mg p.o. twice daily. Her weight is fluctuating minimally. She is following a generally unhealthy diet. When asked about meal planning, she reported none. She has not had a previous visit with a dietitian. She rarely participates in exercise.  Her home blood glucose trend is decreasing steadily. (She reports improving glycemic profile at fasting, continued to have significantly above target postprandial glycemic profile.   Fasting: From the most recent  164, 176, 184,183, 192, 178, [phone] Prelunch from the most recent: 214, 220, 194, 215, 186, 201, 202, 234, 306  Preceptor from the most recent contact 270, 246, 265, 272, 256, 220, 246, 260, 299 Bedtime from the most recent: 216, 222, 230, 225, 214, 204, 212  ) An ACE inhibitor/angiotensin II receptor blocker is not being taken. Eye exam is current.  Hyperlipidemia This is a chronic problem. The current episode started more than 1 year ago. The problem is uncontrolled. Exacerbating diseases include diabetes and obesity. Pertinent negatives include no chest pain, myalgias or shortness of breath. She is currently on no antihyperlipidemic treatment. Risk factors for coronary artery disease include diabetes mellitus, dyslipidemia, family history, hypertension, obesity, a sedentary lifestyle and post-menopausal.  Hypertension This is a chronic problem. The current episode started more than 1 year ago. Pertinent negatives include no chest pain, headaches, palpitations or shortness of breath. Risk factors for coronary artery disease include dyslipidemia, diabetes mellitus, obesity, sedentary lifestyle,  post-menopausal state and family history. Past treatments include nothing.   Review of systems: Limited as above.    Objective:    There were no vitals taken for this visit.  Wt Readings from Last 3 Encounters:  05/07/19 285 lb (129.3 kg)  08/09/18 285 lb (129.3 kg)  07/07/18 288 lb 12.8 oz (131 kg)      CMP ( most recent) CMP     Component Value Date/Time   NA 137 04/15/2019 0000   NA 140 10/19/2011 0919   K 4.6 04/15/2019 0000   K 4.1 10/19/2011 0919   CL 98 (A) 04/15/2019 0000   CL 104 10/19/2011 0919   CO2 31 (A) 04/15/2019 0000   CO2 24 10/19/2011 0919   GLUCOSE 332 (H) 07/07/2018 0748   GLUCOSE 104 (H) 10/19/2011 0919   BUN 16 04/15/2019 0000   BUN 16 10/19/2011 0919   CREATININE 0.6 04/15/2019 0000   CREATININE 0.57 07/07/2018 0748   CREATININE 0.63 10/19/2011 0919   CALCIUM 9.2 04/15/2019 0000   CALCIUM 8.5 10/19/2011 0919   PROT 7.5 02/05/2015 1315   PROT 7.6 10/19/2011 0919   ALBUMIN 4.2 04/15/2019 0000   ALBUMIN 4.2 10/19/2011 0919   ALBUMIN 4.1 10/19/2011 0919   AST 20 04/15/2019 0000   AST 19 10/19/2011 0919   ALT 22 04/15/2019 0000   ALT 24 10/19/2011 0919   ALKPHOS 47 04/15/2019 0000   ALKPHOS 57 10/19/2011 0919   BILITOT 0.8 02/05/2015 1315   BILITOT 0.7 10/19/2011 0919   GFRNONAA 105 04/15/2019 0000   GFRNONAA >60 10/19/2011 0919   GFRAA 122 04/15/2019 0000   GFRAA >60 10/19/2011 0919     Diabetic Labs (most recent): Lab Results  Component Value Date   HGBA1C 10.3 04/16/2019   HGBA1C 10.3 04/15/2019   HGBA1C 8.7 (H) 11/20/2014     Lipid Panel ( most recent) Lipid Panel     Component Value Date/Time   CHOL 245 (A) 06/21/2018 0000   TRIG 185 (A) 06/21/2018 0000   HDL 42 06/21/2018 0000   LDLCALC 169 06/21/2018 0000      Lab Results  Component Value Date   TSH 2.675 11/20/2014   TSH 1.62 10/19/2011      Assessment & Plan:   1. Type 2 diabetes mellitus with hyperglycemia, unspecified whether long  term insulin use  ([place])  - Christina Cameron has currently uncontrolled symptomatic type 2 DM since 53 years of age,  with most recent A1c of 10.3%. Recent labs reviewed.  She reports improving glycemic profile at fasting, continued to have significantly above target postprandial glycemic profile.   Fasting: From the most recent  164, 176, 184,183, 192, 178, [phone] Prelunch from the most recent: 214, 220, 194, 215, 186, 201, 202, 234, 306  Preceptor from the most recent contact 270, 246, 265, 272, 256, 220, 246, 260, 299 Bedtime from the most recent: 216, 222, 230, 225, 214, 204, 212   - I had a long discussion with her about the progressive nature of diabetes and the pathology behind its complications. -her diabetes is complicated by obesity/sedentary life, peripheral neuropathy and she remains at a high risk for more acute and chronic complications which include CAD, CVA, CKD, retinopathy, and neuropathy. These are all discussed in detail with her.  - I have counseled her on diet  and weight management  by adopting a carbohydrate restricted/protein rich diet. Patient is encouraged to switch to  unprocessed or minimally processed     complex starch and increased protein intake (animal or plant source), fruits, and vegetables. -  she is advised to stick to a routine mealtimes to eat 3 meals  a day and avoid unnecessary snacks ( to snack only to correct hypoglycemia).   - she  admits there is a room for improvement in her diet and drink choices. -  Suggestion is made for her to avoid simple carbohydrates  from her diet including Cakes, Sweet Desserts / Pastries, Ice Cream, Soda (diet and regular), Sweet Tea, Candies, Chips, Cookies, Sweet Pastries,  Store Bought Juices, Alcohol in Excess of  1-2 drinks a day, Artificial Sweeteners, Coffee Creamer, and "Sugar-free" Products. This will help patient to have stable blood glucose profile and potentially avoid unintended weight gain.  - she will be scheduled with  Jearld Fenton, RDN, CDE for diabetes education.  - I have approached her with the following individualized plan to manage  her diabetes and patient agrees:   -Given her current and prevailing glycemic burden, she will continue to need at least basal insulin in order for her to achieve control of diabetes to target. - she is advised to increase her Lantus to 40 units nightly, continue to monitor blood glucose at least twice a day-daily before breakfast and at bedtime.    - she is warned not to take insulin without proper monitoring per orders.  - she is encouraged to call clinic for blood glucose levels less than 70 or above 200 mg /dl. - she is advised to continue Trulicity 1.5 mg subcutaneously weekly, Glucophage 500 mg  p.o. twice daily, therapeutically suitable for patient .   - Specific targets for  A1c;  LDL, HDL, Triglycerides, and  Waist Circumference were discussed with the patient.  2) Blood Pressure /Hypertension:  her blood pressure is  controlled to target.  She will benefit from a low-dose ACE inhibitor treatment.  She was initiated on lisinopril 5 mg p.o. daily at breakfast during her last visit.    Marland Kitchen 3) Lipids/Hyperlipidemia:   Review of her recent lipid panel showed un controlled  LDL at 169 .  She was initiated and is advised to continue Crestor 20 mg p.o. nightly.   Side effects and precautions discussed with her.  4)  Weight/Diet: Her BMI is 47-   clearly complicating her diabetes care.  she is  a candidate for weight loss. I discussed with her the fact that loss of 5 - 10% of her  current body weight will have the most impact on her diabetes management.  Exercise, and detailed carbohydrates information provided  -  detailed on discharge instructions.  She is a good candidate for weight loss surgery, will be discussed on subsequent visits.  5) Chronic Care/Health Maintenance:  -she  is on ACEI/ARB and Statin medications and  is encouraged to initiate and continue to  follow up with Ophthalmology, Dentist,  Podiatrist at least yearly or according to recommendations, and advised to  stay away from smoking. I have recommended yearly flu vaccine and pneumonia vaccine at least every 5 years; moderate intensity exercise for up to 150 minutes weekly; and  sleep for at least 7 hours a day.  - she is  advised to maintain close follow up with Sinda Du, MD for primary care needs, as well as her other providers for optimal and coordinated care.  - Patient Care Time Today:  25 min, of which >50% was spent in  counseling and the rest reviewing her  current and  previous labs/studies, previous treatments, her blood glucose readings, and medications' doses and developing a plan for long-term care based on the latest recommendations for standards of care.   Christina Cameron participated in the discussions, expressed understanding, and voiced agreement with the above plans.  All questions were answered to her satisfaction. she is encouraged to contact clinic should she have any questions or concerns prior to her return visit.  Follow up plan: - Return in about 9 weeks (around 07/22/2019) for Bring Meter and Logs- A1c in Office, Include 8 log sheets.  Christina Lloyd, MD [facility] [address] [place], [place] [address] Phone: [phone]  Fax: [phone]    05/20/2019, 8:53 AM  This note was partially dictated with voice recognition software. Similar sounding words can be transcribed inadequately or may not  be corrected upon review.

## 2019-05-26 ENCOUNTER — Other Ambulatory Visit: Payer: Self-pay

## 2019-05-26 ENCOUNTER — Ambulatory Visit
Admission: EM | Admit: 2019-05-26 | Discharge: 2019-05-26 | Disposition: A | Discharge status: Home / Self Care | Payer: Medicaid Other | Attending: Emergency Medicine | Admitting: Emergency Medicine

## 2019-05-26 DIAGNOSIS — R0789 Other chest pain: Secondary | ICD-10-CM | POA: Diagnosis not present

## 2019-05-26 DIAGNOSIS — M791 Myalgia, unspecified site: Secondary | ICD-10-CM | POA: Diagnosis not present

## 2019-05-26 DIAGNOSIS — Z20828 Contact with and (suspected) exposure to other viral communicable diseases: Secondary | ICD-10-CM

## 2019-05-26 DIAGNOSIS — J029 Acute pharyngitis, unspecified: Secondary | ICD-10-CM | POA: Diagnosis not present

## 2019-05-26 DIAGNOSIS — Z20822 Contact with and (suspected) exposure to covid-19: Secondary | ICD-10-CM

## 2019-05-26 DIAGNOSIS — R519 Headache, unspecified: Secondary | ICD-10-CM

## 2019-05-26 MED ORDER — CETIRIZINE HCL 10 MG PO TABS: 10.0000 mg | ORAL_TABLET | Freq: Every day | ORAL | 0 refills | Status: DC

## 2019-05-26 MED ORDER — FLUTICASONE PROPIONATE 50 MCG/ACT NA SUSP: 1.0000 | Freq: Every day | NASAL | 0 refills | Status: DC

## 2019-05-26 NOTE — ED Provider Notes (Signed)
[facility]    CSN: [id number] Arrival date & time: 05/26/19  1736      History   Chief Complaint Chief Complaint  Patient presents with  . sore throat, body aches    HPI Christina Cameron is a 53 y.o. female.   Christina Cameron 53 years old female presented to the urgent care with a complaint of sore throat, headaches, chest tightness, body aches for the past 1 day.  She was exposed to a Covid positive family member couple days ago.  Denies sick exposure to , flu or strep.  Denies recent travel.  Denies aggravating or alleviating symptoms.  Denies previous COVID infection.   Denies fever, chills, fatigue, nasal congestion, rhinorrhea,  cough, SOB, wheezing, chest pain, nausea, vomiting, changes in bowel or bladder habits.       Past Medical History:  Diagnosis Date  . Acute bronchitis, unspecified   . Arthritis    right knee  . Cancer ([place])    cervical 1989  . Chest pain    a. normal cors by cath in 11/2014  . Chronic diastolic (congestive) heart failure ([place])   . Diabetes mellitus without complication ([place])   . Enlarged heart   . High cholesterol   . Hyperlipidemia, unspecified   . Hypertension   . Leaky heart valve   . Morbid (severe) obesity due to excess calories ([place])   . Osteoarthritis of knee, unspecified   . Palpitations   . Raynaud's syndrome   . Somnolence   . SVT (supraventricular tachycardia) ([facility])    a. s/p ablation by Dr. Lovena Le in 2006.  . Tachycardia   . Type 2 diabetes mellitus with hyperglycemia ([place])   . Unspecified asthma, uncomplicated     Patient Active Problem List   Diagnosis Date Noted  . Essential hypertension, benign 05/07/2019  . Acute bronchitis, unspecified   . Chronic diastolic (congestive) heart failure ([place])   . Mixed hyperlipidemia   . Morbid (severe) obesity due to excess calories ([place])   . Osteoarthritis of knee, unspecified   . Palpitations   . Somnolence   . Type 2 diabetes mellitus with hyperglycemia ([place])   .  Unspecified asthma, uncomplicated   . Raynaud's syndrome   . DOE (dyspnea on exertion)   . Chest pain 09/08/2014  . Heart palpitations 08/04/2014  . DM (diabetes mellitus) ([place]) 08/04/2014    Past Surgical History:  Procedure Laterality Date  . CARDIAC CATHETERIZATION    . CARDIAC CATHETERIZATION N/A 12/14/2014   Procedure: Right/Left Heart Cath and Coronary Angiography;  Surgeon: Sherren Mocha, MD;  Location: [facility];  Service: Cardiovascular;  Laterality: N/A;  . CARDIAC ELECTROPHYSIOLOGY STUDY AND ABLATION    . CESAREAN SECTION     x2  . ENDOMETRIAL ABLATION      OB History    Gravida  3   Para  2   Term  2   Preterm      AB  1   Living  2     SAB  1   TAB      Ectopic      Multiple      Live Births               Home Medications    Prior to Admission medications   Medication Sig Start Date End Date Taking? Authorizing Provider  cetirizine (ZYRTEC ALLERGY) 10 MG tablet Take 1 tablet (10 mg total) by mouth daily. 05/26/19   Pilar Grammes  S, FNP  desvenlafaxine (PRISTIQ) 50 MG 24 hr tablet Take 50 mg by mouth daily.    [provider]  Dulaglutide (TRULICITY) 1.5 0000000 SOPN Inject into the skin once a week.    [provider]  fluticasone (FLONASE) 50 MCG/ACT nasal spray Place 1 spray into both nostrils daily for 14 days. 05/26/19 06/09/19  Aris Moman, Darrelyn Hillock, FNP  furosemide (LASIX) 20 MG tablet Take 20 mg by mouth daily as needed.    [provider]  Insulin Glargine (LANTUS SOLOSTAR) 100 UNIT/ML Solostar Pen Inject 40 Units into the skin at bedtime. Patient taking differently: Inject 40 Units into the skin 2 (two) times daily.  05/20/19   Cassandria Anger, MD  Insulin Pen Needle (B-D ULTRAFINE III SHORT PEN) 31G X 8 MM MISC 1 each by Does not apply route as directed. 05/07/19   Cassandria Anger, MD  lisinopril (ZESTRIL) 5 MG tablet Take 1 tablet (5 mg total) by mouth daily. 05/07/19   Cassandria Anger, MD  metFORMIN (GLUCOPHAGE) 500 MG tablet Take 1 tablet (500 mg total) by mouth 2 (two) times daily with a meal. 10/15/17   Amyot, Nicholes Stairs, NP  montelukast (SINGULAIR) 10 MG tablet Take 10 mg by mouth at bedtime.    [provider]  ranolazine (RANEXA) 500 MG 12 hr tablet Take 500 mg by mouth 2 (two) times daily.    [provider]  rosuvastatin (CRESTOR) 20 MG tablet Take 1 tablet (20 mg total) by mouth daily. 05/07/19   Cassandria Anger, MD    Family History Family History  Problem Relation Age of Onset  . Cancer Mother   . Cancer Father   . Stroke Brother   . Hypertension Brother     Social History Social History   Tobacco Use  . Smoking status: Never Smoker  . Smokeless tobacco: Never Used  Substance Use Topics  . Alcohol use: Yes    Comment: seldom  . Drug use: No     Allergies   Patient has no known allergies.   Review of Systems Review of Systems  Constitutional: Negative.   HENT: Positive for sore throat.   Respiratory: Positive for chest tightness.   Cardiovascular: Negative.   Gastrointestinal: Negative.   Musculoskeletal:       Body-aches  Neurological: Positive for headaches.  ROS: all other are nergatives   Physical Exam Triage Vital Signs ED Triage Vitals  Enc Vitals Group     BP 05/26/19 1821 136/86     Pulse Rate 05/26/19 1821 87     Resp 05/26/19 1821 18     Temp 05/26/19 1821 98.6 F (37 C)     Temp Source 05/26/19 1821 Oral     SpO2 05/26/19 1821 97 %     Weight --      Height --      Head Circumference --      Peak Flow --      Pain Score 05/26/19 1823 3     Pain Loc --      Pain Edu? --      Excl. in [place]? --    No data found.  Updated Vital Signs BP 136/86 (BP Location: Right Arm)   Pulse 87   Temp 98.6 F (37 C) (Oral)   Resp 18   SpO2 97%   Visual Acuity Right Eye Distance:   Left Eye Distance:   Bilateral Distance:    Right Eye Near:  Left Eye Near:    Bilateral Near:      Physical Exam Constitutional:      General: She is not in acute distress.    Appearance: Normal appearance. She is normal weight. She is not ill-appearing or toxic-appearing.  HENT:     Head: Normocephalic.     Right Ear: Tympanic membrane, ear canal and external ear normal. There is no impacted cerumen.     Left Ear: Tympanic membrane, ear canal and external ear normal. There is no impacted cerumen.     Nose: Nose normal. No congestion.     Mouth/Throat:     Mouth: Mucous membranes are moist.     Pharynx: No oropharyngeal exudate or posterior oropharyngeal erythema.  Cardiovascular:     Rate and Rhythm: Normal rate and regular rhythm.     Pulses: Normal pulses.     Heart sounds: Normal heart sounds. No murmur.  Pulmonary:     Effort: Pulmonary effort is normal. No respiratory distress.     Breath sounds: No wheezing or rhonchi.  Chest:     Chest wall: No tenderness.  Abdominal:     General: Abdomen is flat. Bowel sounds are normal. There is no distension.     Palpations: There is no mass.  Skin:    Capillary Refill: Capillary refill takes less than 2 seconds.  Neurological:     Mental Status: She is alert and oriented to person, place, and time.      UC Treatments / Results  Labs (all labs ordered are listed, but only abnormal results are displayed) Labs Reviewed  NOVEL CORONAVIRUS, NAA    EKG   Radiology No results found.  Procedures Procedures (including critical care time)  Medications Ordered in UC Medications - No data to display  Initial Impression / Assessment and Plan / UC Course  I have reviewed the triage vital signs and the nursing notes.  Pertinent labs & imaging results that were available during my care of the patient were reviewed by me and considered in my medical decision making (see chart for details).   Patient stable for discharge.  Benign physical exam.  Patient was advised to quarantine until COVID-19 result  become available.  To go to  ED for worsening of symptoms.  Patient verbalized advised of the plan of care.   Final Clinical Impressions(s) / UC Diagnoses   Final diagnoses:  Suspected COVID-19 virus infection  Viral pharyngitis  Chest tightness     Discharge Instructions     COVID testing ordered.  It will take between 2-7 days for test results.  Someone will contact you regarding abnormal results.    In the meantime: You should remain isolated in your home for 10 days from symptom onset  Get plenty of rest and push fluids Zyrtec-D prescribed for nasal congestion, runny nose, and/or sore throat Flonase prescribed for nasal congestion and runny nose Use medications daily for symptom relief Use OTC medications like ibuprofen or tylenol as needed fever or pain Call or go to the ED if you have any new or worsening symptoms such as fever, worsening cough, shortness of breath, chest tightness, chest pain, turning blue, changes in mental status, etc...     ED Prescriptions    Medication Sig Dispense Auth. Provider   cetirizine (ZYRTEC ALLERGY) 10 MG tablet Take 1 tablet (10 mg total) by mouth daily. 30 tablet Joby Richart S, FNP   fluticasone (FLONASE) 50 MCG/ACT nasal spray Place 1 spray into both nostrils  daily for 14 days. 16 g Emerson Monte, FNP     PDMP not reviewed this encounter.   Emerson Monte, [place] 05/26/19 1911

## 2019-05-26 NOTE — ED Triage Notes (Signed)
Pt presents to UC w/ positive covid exposure. Pt presents to UC w/ sore throat, headache, chest tightness, body aches since yesterday.

## 2019-05-26 NOTE — Discharge Instructions (Signed)
COVID testing ordered.  It will take between 2-7 days for test results.  Someone will contact you regarding abnormal results.    In the meantime: You should remain isolated in your home for 10 days from symptom onset  Get plenty of rest and push fluids Zyrtec-D prescribed for nasal congestion, runny nose, and/or sore throat Flonase prescribed for nasal congestion and runny nose Use medications daily for symptom relief Use OTC medications like ibuprofen or tylenol as needed fever or pain Call or go to the ED if you have any new or worsening symptoms such as fever, worsening cough, shortness of breath, chest tightness, chest pain, turning blue, changes in mental status, etc..Marland Kitchen

## 2019-05-28 LAB — NOVEL CORONAVIRUS, NAA: SARS-CoV-2, NAA: NOT DETECTED

## 2019-05-29 ENCOUNTER — Ambulatory Visit: Payer: Self-pay

## 2019-05-29 ENCOUNTER — Other Ambulatory Visit: Payer: Self-pay

## 2019-05-29 ENCOUNTER — Ambulatory Visit
Admission: EM | Admit: 2019-05-29 | Discharge: 2019-05-29 | Disposition: A | Discharge status: Home / Self Care | Payer: Medicaid Other

## 2019-05-29 ENCOUNTER — Emergency Department (HOSPITAL_COMMUNITY): Payer: Medicaid Other

## 2019-05-29 ENCOUNTER — Encounter (HOSPITAL_COMMUNITY): Payer: Self-pay | Admitting: Emergency Medicine

## 2019-05-29 ENCOUNTER — Emergency Department (HOSPITAL_COMMUNITY)
Admission: EM | Admit: 2019-05-29 | Discharge: 2019-05-29 | Disposition: A | Discharge status: Home / Self Care | Payer: Medicaid Other | Attending: Emergency Medicine | Admitting: Emergency Medicine

## 2019-05-29 DIAGNOSIS — R0602 Shortness of breath: Secondary | ICD-10-CM | POA: Insufficient documentation

## 2019-05-29 DIAGNOSIS — Z5321 Procedure and treatment not carried out due to patient leaving prior to being seen by health care provider: Secondary | ICD-10-CM | POA: Insufficient documentation

## 2019-05-29 MED ORDER — ALBUTEROL SULFATE (2.5 MG/3ML) 0.083% IN NEBU: 5.0000 mg | INHALATION_SOLUTION | Freq: Once | RESPIRATORY_TRACT | Status: DC

## 2019-05-29 NOTE — ED Notes (Signed)
Pt left without being seen.

## 2019-05-29 NOTE — Telephone Encounter (Signed)
Pt. Reports she has had a known exposure to COVID 19, but tested negative earlier this week. Yesterday started having symptoms - body aches, fever, cough, chest tightness, shortness of breath. Her PCP has retired. Instructed to go back to UC. Verbalizes understanding.  Reason for Disposition . MILD difficulty breathing (e.g., minimal/no SOB at rest, SOB with walking, pulse <100)  Answer Assessment - Initial Assessment Questions 1. COVID-19 DIAGNOSIS: "Who made your Coronavirus (COVID-19) diagnosis?" "Was it confirmed by a positive lab test?" If not diagnosed by a HCP, ask "Are there lots of cases (community spread) where you live?" (See public health department website, if unsure)     Had a negative test 2. COVID-19 EXPOSURE: "Was there any known exposure to COVID before the symptoms began?" CDC Definition of close contact: within 6 feet (2 meters) for a total of 15 minutes or more over a 24-hour period.      Yes 3. ONSET: "When did the COVID-19 symptoms start?"      Yesterday 4. WORST SYMPTOM: "What is your worst symptom?" (e.g., cough, fever, shortness of breath, muscle aches)     Cough 5. COUGH: "Do you have a cough?" If so, ask: "How bad is the cough?"       Moderate 6. FEVER: "Do you have a fever?" If so, ask: "What is your temperature, how was it measured, and when did it start?"     100 7. RESPIRATORY STATUS: "Describe your breathing?" (e.g., shortness of breath, wheezing, unable to speak)      Some shortness of breath 8. BETTER-SAME-WORSE: "Are you getting better, staying the same or getting worse compared to yesterday?"  If getting worse, ask, "In what way?"     Worse 9. HIGH RISK DISEASE: "Do you have any chronic medical problems?" (e.g., asthma, heart or lung disease, weak immune system, obesity, etc.)     Diabetes, HTN, heart disease 10. PREGNANCY: "Is there any chance you are pregnant?" "When was your last menstrual period?"       No 11. OTHER SYMPTOMS: "Do you have any other  symptoms?"  (e.g., chills, fatigue, headache, loss of smell or taste, muscle pain, sore throat; new loss of smell or taste especially support the diagnosis of COVID-19)       Body aches,fever,chest tightness  Protocols used: CORONAVIRUS (COVID-19) DIAGNOSED OR SUSPECTED-A-AH

## 2019-05-29 NOTE — ED Triage Notes (Signed)
Patient complains of shortness of breath. Patient states her family members are all now COVID positive. Patient had negative test on 28th. Patient is now short of breath with worsening cough, fever, chills.

## 2019-05-30 DIAGNOSIS — U099 Post covid-19 condition, unspecified: Secondary | ICD-10-CM

## 2019-05-30 HISTORY — DX: Post covid-19 condition, unspecified: U09.9

## 2019-06-01 ENCOUNTER — Emergency Department (HOSPITAL_COMMUNITY): Payer: Medicaid Other

## 2019-06-01 ENCOUNTER — Emergency Department (HOSPITAL_COMMUNITY)
Admission: EM | Admit: 2019-06-01 | Discharge: 2019-06-01 | Disposition: A | Discharge status: Home / Self Care | Payer: Medicaid Other | Attending: Emergency Medicine | Admitting: Emergency Medicine

## 2019-06-01 ENCOUNTER — Encounter (HOSPITAL_COMMUNITY): Payer: Self-pay | Admitting: Emergency Medicine

## 2019-06-01 ENCOUNTER — Other Ambulatory Visit: Payer: Self-pay

## 2019-06-01 DIAGNOSIS — I5032 Chronic diastolic (congestive) heart failure: Secondary | ICD-10-CM | POA: Diagnosis not present

## 2019-06-01 DIAGNOSIS — E119 Type 2 diabetes mellitus without complications: Secondary | ICD-10-CM | POA: Diagnosis not present

## 2019-06-01 DIAGNOSIS — I110 Hypertensive heart disease with heart failure: Secondary | ICD-10-CM | POA: Insufficient documentation

## 2019-06-01 DIAGNOSIS — Z79899 Other long term (current) drug therapy: Secondary | ICD-10-CM | POA: Insufficient documentation

## 2019-06-01 DIAGNOSIS — R0602 Shortness of breath: Secondary | ICD-10-CM | POA: Diagnosis present

## 2019-06-01 DIAGNOSIS — J45909 Unspecified asthma, uncomplicated: Secondary | ICD-10-CM | POA: Insufficient documentation

## 2019-06-01 DIAGNOSIS — U071 COVID-19: Secondary | ICD-10-CM | POA: Diagnosis not present

## 2019-06-01 DIAGNOSIS — Z794 Long term (current) use of insulin: Secondary | ICD-10-CM | POA: Insufficient documentation

## 2019-06-01 LAB — COMPREHENSIVE METABOLIC PANEL
ALT: 50 U/L — ABNORMAL HIGH (ref 0–44)
AST: 42 U/L — ABNORMAL HIGH (ref 15–41)
Albumin: 4.3 g/dL (ref 3.5–5.0)
Alkaline Phosphatase: 53 U/L (ref 38–126)
Anion gap: 12 (ref 5–15)
BUN: 17 mg/dL (ref 6–20)
CO2: 22 mmol/L (ref 22–32)
Calcium: 8.8 mg/dL — ABNORMAL LOW (ref 8.9–10.3)
Chloride: 102 mmol/L (ref 98–111)
Creatinine, Ser: 0.52 mg/dL (ref 0.44–1.00)
GFR calc Af Amer: >60 mL/min (ref >60)
GFR calc non Af Amer: >60 mL/min (ref >60)
Glucose, Bld: 226 mg/dL — ABNORMAL HIGH (ref 70–99)
Potassium: 4.2 mmol/L (ref 3.5–5.1)
Sodium: 136 mmol/L (ref 135–145)
Total Bilirubin: 0.9 mg/dL (ref 0.3–1.2)
Total Protein: 8.2 g/dL — ABNORMAL HIGH (ref 6.5–8.1)

## 2019-06-01 LAB — CBC WITH DIFFERENTIAL/PLATELET
Abs Immature Granulocytes: 0.02 10*3/uL (ref 0.00–0.07)
Basophils Absolute: 0 10*3/uL (ref 0.0–0.1)
Basophils Relative: 1 %
Eosinophils Absolute: 0.1 10*3/uL (ref 0.0–0.5)
Eosinophils Relative: 3 %
HCT: 43.5 % (ref 36.0–46.0)
Hemoglobin: 14.9 g/dL (ref 12.0–15.0)
Immature Granulocytes: 0 %
Lymphocytes Relative: 55 %
Lymphs Abs: 2.7 10*3/uL (ref 0.7–4.0)
MCH: 29.7 pg (ref 26.0–34.0)
MCHC: 34.3 g/dL (ref 30.0–36.0)
MCV: 86.8 fL (ref 80.0–100.0)
Monocytes Absolute: 0.5 10*3/uL (ref 0.1–1.0)
Monocytes Relative: 10 %
Neutro Abs: 1.5 10*3/uL — ABNORMAL LOW (ref 1.7–7.7)
Neutrophils Relative %: 31 %
Platelets: 228 10*3/uL (ref 150–400)
RBC: 5.01 MIL/uL (ref 3.87–5.11)
RDW: 13 % (ref 11.5–15.5)
WBC: 4.9 10*3/uL (ref 4.0–10.5)
nRBC: 0 % (ref 0.0–0.2)

## 2019-06-01 LAB — CBG MONITORING, ED: Glucose-Capillary: 226 mg/dL — ABNORMAL HIGH (ref 70–99)

## 2019-06-01 LAB — TROPONIN I (HIGH SENSITIVITY): Troponin I (High Sensitivity): <2 ng/L (ref <18)

## 2019-06-01 MED ORDER — ALBUTEROL SULFATE HFA 108 (90 BASE) MCG/ACT IN AERS: 8.0000 | INHALATION_SPRAY | Freq: Four times a day (QID) | RESPIRATORY_TRACT | 0 refills | Status: DC | PRN

## 2019-06-01 MED ORDER — BENZONATATE 100 MG PO CAPS: 200.0000 mg | ORAL_CAPSULE | Freq: Three times a day (TID) | ORAL | 0 refills | Status: DC | PRN

## 2019-06-01 MED ORDER — AEROCHAMBER PLUS FLO-VU MEDIUM MISC
1.0000 | Freq: Once | Status: AC
  Administered 2019-06-01: 1
  Filled 2019-06-01: qty 1

## 2019-06-01 MED ORDER — ALBUTEROL SULFATE HFA 108 (90 BASE) MCG/ACT IN AERS
8.0000 | INHALATION_SPRAY | Freq: Once | RESPIRATORY_TRACT | Status: AC
  Administered 2019-06-01: 8 via RESPIRATORY_TRACT
  Filled 2019-06-01: qty 6.7

## 2019-06-01 NOTE — ED Provider Notes (Signed)
[facility] Provider Note   CSN: [id number] Arrival date & time: 06/01/19  1702     History Chief Complaint  Patient presents with  . Shortness of Breath    Christina Cameron is a 54 y.o. female with h/o DM on insulin and oral agents, HTN, HLD, obesity, diastolic HF with normal EF, SVT s/p ablation 2015 presents to ER for evaluation of shortness of breath.  Reports being diagnosed with COVID at local pharmacy test positive on 12/31.  Her daughter developed symptoms first.  She developed generalized malaise on 12/30 and subsequently developed low grade fever around 100, sore throat, cough with phlegm, nausea, chest pain, loss of taste and smell.  Her chest pain is constant, "burning" central that is worse with breathing.  She feels like she can't take a full deep breath and her breathing gets "cut off".  Reports chronic LE edema and taking lasix for this.  LE edema is actually better than usual today.  Fever broke yesterday, none today. No modifying factors.   Denies headache, congestion, vomiting, diarrhea, abdominal pain, body aches. No h/o tobacco use. No known lung disease.     HPI     Past Medical History:  Diagnosis Date  . Acute bronchitis, unspecified   . Arthritis    right knee  . Cancer ([place])    cervical 1989  . Chest pain    a. normal cors by cath in 11/2014  . Chronic diastolic (congestive) heart failure ([place])   . Diabetes mellitus without complication ([place])   . Enlarged heart   . High cholesterol   . Hyperlipidemia, unspecified   . Hypertension   . Leaky heart valve   . Morbid (severe) obesity due to excess calories ([place])   . Osteoarthritis of knee, unspecified   . Palpitations   . Raynaud's syndrome   . Somnolence   . SVT (supraventricular tachycardia) ([facility])    a. s/p ablation by Dr. Lovena Le in 2006.  . Tachycardia   . Type 2 diabetes mellitus with hyperglycemia ([place])   . Unspecified asthma, uncomplicated     Patient Active Problem List    Diagnosis Date Noted  . Essential hypertension, benign 05/07/2019  . Acute bronchitis, unspecified   . Chronic diastolic (congestive) heart failure ([place])   . Mixed hyperlipidemia   . Morbid (severe) obesity due to excess calories ([place])   . Osteoarthritis of knee, unspecified   . Palpitations   . Somnolence   . Type 2 diabetes mellitus with hyperglycemia ([place])   . Unspecified asthma, uncomplicated   . Raynaud's syndrome   . DOE (dyspnea on exertion)   . Chest pain 09/08/2014  . Heart palpitations 08/04/2014  . DM (diabetes mellitus) ([place]) 08/04/2014    Past Surgical History:  Procedure Laterality Date  . CARDIAC CATHETERIZATION    . CARDIAC CATHETERIZATION N/A 12/14/2014   Procedure: Right/Left Heart Cath and Coronary Angiography;  Surgeon: Sherren Mocha, MD;  Location: [facility];  Service: Cardiovascular;  Laterality: N/A;  . CARDIAC ELECTROPHYSIOLOGY STUDY AND ABLATION    . CESAREAN SECTION     x2  . ENDOMETRIAL ABLATION       OB History    Gravida  3   Para  2   Term  2   Preterm      AB  1   Living  2     SAB  1   TAB      Ectopic  Multiple      Live Births              Family History  Problem Relation Age of Onset  . Cancer Mother   . Cancer Father   . Stroke Brother   . Hypertension Brother     Social History   Tobacco Use  . Smoking status: Never Smoker  . Smokeless tobacco: Never Used  Substance Use Topics  . Alcohol use: Yes    Comment: seldom  . Drug use: No    Home Medications Prior to Admission medications   Medication Sig Start Date End Date Taking? Authorizing Provider  cetirizine (ZYRTEC ALLERGY) 10 MG tablet Take 1 tablet (10 mg total) by mouth daily. 05/26/19  Yes Avegno, Darrelyn Hillock, FNP  desvenlafaxine (PRISTIQ) 50 MG 24 hr tablet Take 50 mg by mouth daily.   Yes [provider]  Dulaglutide (TRULICITY) 1.5 0000000 SOPN Inject 1.5 mg into the skin every Sunday.    Yes [provider]    furosemide (LASIX) 20 MG tablet Take 20 mg by mouth daily as needed for fluid.    Yes [provider]  Insulin Glargine (LANTUS SOLOSTAR) 100 UNIT/ML Solostar Pen Inject 40 Units into the skin at bedtime. Patient taking differently: Inject 40 Units into the skin 2 (two) times daily.  05/20/19  Yes Nida, Marella Chimes, MD  lisinopril (ZESTRIL) 5 MG tablet Take 1 tablet (5 mg total) by mouth daily. 05/07/19  Yes Nida, Marella Chimes, MD  metFORMIN (GLUCOPHAGE) 500 MG tablet Take 1 tablet (500 mg total) by mouth 2 (two) times daily with a meal. Patient taking differently: Take 1,000 mg by mouth 2 (two) times daily with a meal.  10/15/17  Yes Amyot, Nicholes Stairs, NP  ranolazine (RANEXA) 500 MG 12 hr tablet Take 500 mg by mouth 2 (two) times daily.   Yes [provider]  rosuvastatin (CRESTOR) 20 MG tablet Take 1 tablet (20 mg total) by mouth daily. Patient taking differently: Take 20 mg by mouth every evening.  05/07/19  Yes Nida, Marella Chimes, MD  albuterol (VENTOLIN HFA) 108 (90 Base) MCG/ACT inhaler Inhale 8 puffs into the lungs every 6 (six) hours as needed for wheezing or shortness of breath. 06/01/19   Kinnie Feil, PA-C  benzonatate (TESSALON PERLES) 100 MG capsule Take 2 capsules (200 mg total) by mouth 3 (three) times daily as needed for cough. FOR DISRUPTIVE DAY TIME COUGH 06/01/19   Kinnie Feil, PA-C  fluticasone [facility]) 50 MCG/ACT nasal spray Place 1 spray into both nostrils daily for 14 days. Patient not taking: Reported on 06/01/2019 05/26/19 06/09/19  Avegno, Darrelyn Hillock, FNP  Insulin Pen Needle (B-D ULTRAFINE III SHORT PEN) 31G X 8 MM MISC 1 each by Does not apply route as directed. Patient not taking: Reported on 06/01/2019 05/07/19   Cassandria Anger, MD    Allergies    Patient has no known allergies.  Review of Systems   Review of Systems  Constitutional: Positive for fever (resolved).  HENT: Positive for sore throat.        Loss of taste and smell    Respiratory: Positive for cough and shortness of breath.   Cardiovascular: Positive for chest pain.  Gastrointestinal: Positive for nausea.  All other systems reviewed and are negative.   Physical Exam Updated Vital Signs BP 110/65   Pulse 84   Temp 98.8 F (37.1 C) (Oral)   Resp 18   Ht 5\' 4"  (1.626  m)   Wt 129.3 kg   SpO2 98%   BMI 48.92 kg/m   Physical Exam Vitals and nursing note reviewed.  Constitutional:      Appearance: She is well-developed.     Comments: Non toxic appearing.   HENT:     Head: Normocephalic and atraumatic.     Right Ear: External ear normal.     Left Ear: External ear normal.     Nose: Nose normal.  Eyes:     General: No scleral icterus.    Conjunctiva/sclera: Conjunctivae normal.  Cardiovascular:     Rate and Rhythm: Normal rate and regular rhythm.     Pulses:          Radial pulses are 1+ on the right side and 1+ on the left side.       Dorsalis pedis pulses are 1+ on the right side and 1+ on the left side.     Heart sounds: Normal heart sounds.     Comments: No LE edema. No calf tenderness. Mild right parasternal tenderness. Skin normal over APL chest, no rash.  Pulmonary:     Effort: Pulmonary effort is normal.     Breath sounds: Normal breath sounds. No wheezing.     Comments: Speaking in full sentences SpO2 100% on RA during encounter.  Normal WOB. Upper/middle lobes clear without wheezing, crackles. Diminished air sounds in lower lobes difficult auscultation due to body habitus.  Chest:     Chest wall: Tenderness present.  Musculoskeletal:        General: No deformity. Normal range of motion.     Cervical back: Normal range of motion and neck supple.  Skin:    General: Skin is warm and dry.     Capillary Refill: Capillary refill takes less than 2 seconds.  Neurological:     Mental Status: She is alert and oriented to person, place, and time.  Psychiatric:        Behavior: Behavior normal.        Thought Content: Thought content  normal.        Judgment: Judgment normal.     ED Results / Procedures / Treatments   Labs (all labs ordered are listed, but only abnormal results are displayed) Labs Reviewed  CBC WITH DIFFERENTIAL/PLATELET - Abnormal; Notable for the following components:      Result Value   Neutro Abs 1.5 (*)    All other components within normal limits  COMPREHENSIVE METABOLIC PANEL - Abnormal; Notable for the following components:   Glucose, Bld 226 (*)    Calcium 8.8 (*)    Total Protein 8.2 (*)    AST 42 (*)    ALT 50 (*)    All other components within normal limits  CBG MONITORING, ED - Abnormal; Notable for the following components:   Glucose-Capillary 226 (*)    All other components within normal limits  TROPONIN I (HIGH SENSITIVITY)    EKG EKG Interpretation  Date/Time:  Sunday June 01 2019 20:37:36 EST Ventricular Rate:  79 PR Interval:    QRS Duration: 86 QT Interval:  402 QTC Calculation: 461 R Axis:   -46 Text Interpretation: Sinus rhythm Left anterior fascicular block Low voltage, precordial leads Anterior injury pattern Artifact Abnormal ECG Confirmed by Carmin Muskrat [phone]) on 06/01/2019 8:44:50 PM   Radiology DG Chest Portable 1 View  Result Date: 06/01/2019 CLINICAL DATA:  Initial evaluation for acute shortness of breath, known COVID infection. EXAM: PORTABLE CHEST 1 VIEW COMPARISON:  Prior radiograph from 08/09/2018. FINDINGS: The cardiac and mediastinal silhouettes are stable in size and contour, and remain within normal limits. The lungs are normally inflated. No airspace consolidation, pleural effusion, or pulmonary edema. No pneumothorax. No acute osseous abnormality. IMPRESSION: No radiographic evidence for active cardiopulmonary disease. Electronically Signed   By: Jeannine Boga M.D.   On: 06/01/2019 20:20    Procedures Procedures (including critical care time)  Medications Ordered in ED Medications  albuterol (VENTOLIN HFA) 108 (90 Base) MCG/ACT  inhaler 8 puff (8 puffs Inhalation Given 06/01/19 2104)  AeroChamber Plus Flo-Vu Medium MISC 1 each (1 each Other Given 06/01/19 2104)    ED Course  I have reviewed the triage vital signs and the nursing notes.  Pertinent labs & imaging results that were available during my care of the patient were reviewed by me and considered in my medical decision making (see chart for details).  Clinical Course as of May 31 2153  Nancy Fetter Jun 01, 2019  2145 NEUT#(!): 1.5 [CG]  2145 AST(!): 42 [CG]  2145 ALT(!): 50 [CG]  2145 Troponin I (High Sensitivity): <2.0 [CG]  2145 IMPRESSION: No radiographic evidence for active cardiopulmonary disease.   DG Chest Portable 1 View [CG]    Clinical Course User Index [CG] Kinnie Feil, PA-C   MDM Rules/Calculators/A&P                      I have reviewed patient's EMR to obtain pertinent PMH to assist in MDM.  Exam benign. Normal WOB. Normal VS.  No tachypnea, hypoxemia.  Lungs are CTAB.   ER work up initiated in triage personally reviewed and vastly reassuring, as above. CXR non acute without opacities, pneumonia, edema.  Trop undetectable. EKG without ischemia, RV strain, arrhythmias.    Suspect COVID related URI/LRI symptoms, atypical CP and subjective SOB.  Her CP is likely related to cough, airway inflammation, bronchoconstriction related to viral/inflammatory process.  No risk factors for PE and I doubt this in setting of all other infectious COVID symptoms. H/o diastolic HF but euvolemic on exam and no edema on CXR.  Last echo 2019 with normal LVF.  Doubt ACS. [place] 2016 with clean coronaries.   Will ambulate for 2 min to evaluate for exertional hypoxia.    2150: Pt ambulated without hypoxia here.  VS normal.  Given reassuring clinical findings/work up, will discharge with symptomatic treatment including OTC medicines, albuterol, antitussives.  Discussed plan with patient. We discussed patient's overall risk profile to develop complications.  Recommended  PCP f/u in the next 2-3 days for re-check to ensure to clinical decline, further guidance as needed. Return precautions given.  Final Clinical Impression(s) / ED Diagnoses Final diagnoses:  Shortness of breath  COVID-19 virus infection    Rx / DC Orders ED Discharge Orders         Ordered    albuterol (VENTOLIN HFA) 108 (90 Base) MCG/ACT inhaler  Every 6 hours PRN     06/01/19 2155    benzonatate (TESSALON PERLES) 100 MG capsule  3 times daily PRN     06/01/19 2155           Arlean Hopping 06/01/19 2155    Carmin Muskrat, MD 06/01/19 2239

## 2019-06-01 NOTE — ED Triage Notes (Signed)
Patient tested positive for Covid on Dec. 31st. She complains of shortness of breath and nausea. Patient has oxygen saturations of 100 percent in triage.

## 2019-06-01 NOTE — ED Notes (Signed)
Ambulating in room, O2 sat remains 98-98%

## 2019-06-01 NOTE — Discharge Instructions (Addendum)
You were seen in the ED for symptoms of COVID, chest pain and shortness of breath  Testing done today was normal. Your oxygen remained normal during walk test.  Chest x-ray was normal.    Treatment of your illness and symptoms for now will include self-isolation, monitoring of symptoms and supportive care with over-the-counter medicines.    Stay well-hydrated. Rest. You can use over the counter medications to help with symptoms: [id number] mg acetaminophen (tylenol) every 6 hours, around the clock to help with associated fevers, sore throat, headaches, generalized body aches and malaise.  Oxymetazoline (afrin) intranasal spray once daily for no more than 3 days to help with congestion, after 3 days you can switch to another over-the-counter nasal steroid spray such as fluticasone (flonase) Allergy medication (loratadine, cetirizine, etc) and phenylephrine (sudafed) help with nasal congestion, runny nose and postnasal drip.   Dextromethorphan (Delsym) to suppress dry cough. Frequent coughing is likely causing your chest wall pain.  I have sent a prescription for tessalon perles that can be used instead of over the counter Delsym to help suppress cough.  Guaifenesin (Mucinex) to help expectorate mucus and cough Wash your hands often to prevent spread.  Stay hydrated with plenty of clear fluids Rest  Albuterol inhaler 8 puffs every 4-6 hours over the next 5 days to help open up airways and help with chest discomfort, cough and shortness of breath  Return to the ED if symptoms are worsening or severe, there is increased work of breathing, chest pain or shortness of breath with exertion or activity, inability to tolerate fluids due to persistent vomiting despite nausea medicines, passing out, light headedness.  Follow up with primary care doctor in 2-3 days for re-evaluation and to ensure your symptoms are improving and you do not need a repeat in person evaluation   If your test results are POSITIVE,  the following isolation requirements need to be met to return to work and resume essential activities: At least 10 days since symptom onset  72 hours of absence of fever without antifever medicine (ibuprofen, acetaminophen). A fever is temperature of 100.8F or greater. Improvement of respiratory symptoms  If your test is NEGATIVE, the following requirements need to be met to return to work and essential activities: 1. Your symptoms have improved  2.  You do not have a fever for a total of 3 days without antifever medicines (ibuprofen, acetaminophen).   3. You do not develop any other symptoms of COVID-19.  Call your job and notify them that your test result was negative to see if they will all you to return to work. Sometimes when a COVID test is done very early on in the illness, it can be false negative.  This means the test did not pick up the virus because it was too early.  If your symptoms are not improving or you develop new symptoms of COVID-19, you may need to be retested.      Infection Prevention Recommendations for Individuals Confirmed to have, or Being Evaluated for, or have symptoms of 2019 Novel Coronavirus (COVID-19) Infection Who Receive Care at Home  Individuals who are confirmed to have, or are being evaluated for, COVID-19 should follow the prevention steps below until a healthcare provider or local or state health department says they can return to normal activities.  Stay home except to get medical care You should restrict activities outside your home, except for getting medical care. Do not go to work, school, or public areas, and do  not use public transportation or taxis.  Call ahead before visiting your doctor Before your medical appointment, call the healthcare provider and tell them that you have, or are being evaluated for, COVID-19 infection. This will help the healthcare provider's office take steps to keep other people from getting infected. Ask your healthcare  provider to call the local or state health department.  Monitor your symptoms Seek prompt medical attention if your illness is worsening (e.g., difficulty breathing). Before going to your medical appointment, call the healthcare provider and tell them that you have, or are being evaluated for, COVID-19 infection. Ask your healthcare provider to call the local or state health department.  Wear a facemask You should wear a facemask that covers your nose and mouth when you are in the same room with other people and when you visit a healthcare provider. People who live with or visit you should also wear a facemask while they are in the same room with you.  Separate yourself from other people in your home As much as possible, you should stay in a different room from other people in your home. Also, you should use a separate bathroom, if available.  Avoid sharing household items You should not share dishes, drinking glasses, cups, eating utensils, towels, bedding, or other items with other people in your home. After using these items, you should wash them thoroughly with soap and water.  Cover your coughs and sneezes Cover your mouth and nose with a tissue when you cough or sneeze, or you can cough or sneeze into your sleeve. Throw used tissues in a lined trash can, and immediately wash your hands with soap and water for at least 20 seconds or use an alcohol-based hand rub.  Wash your [facility] your hands often and thoroughly with soap and water for at least 20 seconds. You can use an alcohol-based hand sanitizer if soap and water are not available and if your hands are not visibly dirty. Avoid touching your eyes, nose, and mouth with unwashed hands.   Prevention Steps for Caregivers and Household Members of Individuals Confirmed to have, or Being Evaluated for, or have symptoms of 2019 Novel Coronavirus (COVID-19) Infection Being Cared for in the Home  If you live with, or provide care at home  for, a person confirmed to have, or being evaluated for, COVID-19 infection please follow these guidelines to prevent infection:  Follow healthcare provider's instructions Make sure that you understand and can help the patient follow any healthcare provider instructions for all care.  Provide for the patient's basic needs You should help the patient with basic needs in the home and provide support for getting groceries, prescriptions, and other personal needs.  Monitor the patient's symptoms If they are getting sicker, call his or her medical provider and tell them that the patient has, or is being evaluated for, COVID-19 infection. This will help the healthcare provider's office take steps to keep other people from getting infected. Ask the healthcare provider to call the local or state health department.  Limit the number of people who have contact with the patient If possible, have only one caregiver for the patient. Other household members should stay in another home or place of residence. If this is not possible, they should stay in another room, or be separated from the patient as much as possible. Use a separate bathroom, if available. Restrict visitors who do not have an essential need to be in the home.  Keep older adults, very  young children, and other sick people away from the patient Keep older adults, very young children, and those who have compromised immune systems or chronic health conditions away from the patient. This includes people with chronic heart, lung, or kidney conditions, diabetes, and cancer.  Ensure good ventilation Make sure that shared spaces in the home have good air flow, such as from an air conditioner or an opened window, weather permitting.  Wash your hands often Wash your hands often and thoroughly with soap and water for at least 20 seconds. You can use an alcohol based hand sanitizer if soap and water are not available and if your hands are not visibly  dirty. Avoid touching your eyes, nose, and mouth with unwashed hands. Use disposable paper towels to dry your hands. If not available, use dedicated cloth towels and replace them when they become wet.  Wear a facemask and gloves Wear a disposable facemask at all times in the room and gloves when you touch or have contact with the patient's blood, body fluids, and/or secretions or excretions, such as sweat, saliva, sputum, nasal mucus, vomit, urine, or feces.  Ensure the mask fits over your nose and mouth tightly, and do not touch it during use. Throw out disposable facemasks and gloves after using them. Do not reuse. Wash your hands immediately after removing your facemask and gloves. If your personal clothing becomes contaminated, carefully remove clothing and launder. Wash your hands after handling contaminated clothing. Place all used disposable facemasks, gloves, and other waste in a lined container before disposing them with other household waste. Remove gloves and wash your hands immediately after handling these items.  Do not share dishes, glasses, or other household items with the patient Avoid sharing household items. You should not share dishes, drinking glasses, cups, eating utensils, towels, bedding, or other items with a patient who is confirmed to have, or being evaluated for, COVID-19 infection. After the person uses these items, you should wash them thoroughly with soap and water.  Wash laundry thoroughly Immediately remove and wash clothes or bedding that have blood, body fluids, and/or secretions or excretions, such as sweat, saliva, sputum, nasal mucus, vomit, urine, or feces, on them. Wear gloves when handling laundry from the patient. Read and follow directions on labels of laundry or clothing items and detergent. In general, wash and dry with the warmest temperatures recommended on the label.  Clean all areas the individual has used often Clean all touchable surfaces, such  as counters, tabletops, doorknobs, bathroom fixtures, toilets, phones, keyboards, tablets, and bedside tables, every day. Also, clean any surfaces that may have blood, body fluids, and/or secretions or excretions on them. Wear gloves when cleaning surfaces the patient has come in contact with. Use a diluted bleach solution (e.g., dilute bleach with 1 part bleach and 10 parts water) or a household disinfectant with a label that says EPA-registered for coronaviruses. To make a bleach solution at home, add 1 tablespoon of bleach to 1 quart (4 cups) of water. For a larger supply, add  cup of bleach to 1 gallon (16 cups) of water. Read labels of cleaning products and follow recommendations provided on product labels. Labels contain instructions for safe and effective use of the cleaning product including precautions you should take when applying the product, such as wearing gloves or eye protection and making sure you have good ventilation during use of the product. Remove gloves and wash hands immediately after cleaning.  Monitor yourself for signs and symptoms of illness  Caregivers and household members are considered close contacts, should monitor their health, and will be asked to limit movement outside of the home to the extent possible. Follow the monitoring steps for close contacts listed on the symptom monitoring form.  ? If you have additional questions, contact your local health department or call the epidemiologist on call at [phone] (available 24/7). ? This guidance is subject to change. For the most up-to-date guidance from [facility], please refer to their website: YouBlogs.pl

## 2019-06-02 ENCOUNTER — Telehealth: Payer: Self-pay | Admitting: Cardiovascular Disease

## 2019-06-02 NOTE — Telephone Encounter (Signed)
Pt is COVID positive and was seen in the ER last night and had an EKG- states she was not given the results from the EKG and would like to someone to please give her a call

## 2019-06-02 NOTE — Telephone Encounter (Signed)
    EKG shows a normal sinus rhythm but it did have baseline artifact, likely due to movement of the leads while they were obtaining the tracing. She is probably referencing the "anterior infarct pattern" and that has been noted on prior tracings and shows no acute ST or T-wave abnormalities concerning for an acute change. Cardiac enzymes were also negative during her ED visit, ruling out an acute MI.   Signed, Erma Heritage, PA-C 06/02/2019, 10:37 AM Pager: [phone]

## 2019-06-02 NOTE — Telephone Encounter (Signed)
Pt seen in ED on last night and states that her EKG was not reviewed with her. Pt would like to have EKG reviewed and results given. Please advise.

## 2019-06-02 NOTE — Telephone Encounter (Signed)
Pt notified and thankful for the call

## 2019-06-18 ENCOUNTER — Ambulatory Visit: Payer: Medicaid Other | Attending: Internal Medicine

## 2019-06-18 ENCOUNTER — Other Ambulatory Visit: Payer: Self-pay

## 2019-06-18 DIAGNOSIS — Z20822 Contact with and (suspected) exposure to covid-19: Secondary | ICD-10-CM

## 2019-06-19 LAB — NOVEL CORONAVIRUS, NAA: SARS-CoV-2, NAA: DETECTED — AB

## 2019-06-20 ENCOUNTER — Ambulatory Visit
Admission: EM | Admit: 2019-06-20 | Discharge: 2019-06-20 | Disposition: A | Discharge status: Home / Self Care | Payer: Medicaid Other | Attending: Emergency Medicine | Admitting: Emergency Medicine

## 2019-06-20 ENCOUNTER — Other Ambulatory Visit: Payer: Self-pay

## 2019-06-20 ENCOUNTER — Ambulatory Visit (INDEPENDENT_AMBULATORY_CARE_PROVIDER_SITE_OTHER): Payer: Medicaid Other

## 2019-06-20 ENCOUNTER — Ambulatory Visit: Payer: Medicaid Other

## 2019-06-20 ENCOUNTER — Telehealth: Payer: Self-pay | Admitting: Nurse Practitioner

## 2019-06-20 DIAGNOSIS — U071 COVID-19: Secondary | ICD-10-CM

## 2019-06-20 DIAGNOSIS — J209 Acute bronchitis, unspecified: Secondary | ICD-10-CM

## 2019-06-20 DIAGNOSIS — R05 Cough: Secondary | ICD-10-CM | POA: Diagnosis not present

## 2019-06-20 DIAGNOSIS — R053 Chronic cough: Secondary | ICD-10-CM

## 2019-06-20 MED ORDER — PREDNISONE 10 MG (21) PO TBPK: ORAL_TABLET | Freq: Every day | ORAL | 0 refills | Status: DC

## 2019-06-20 MED ORDER — ALBUTEROL SULFATE HFA 108 (90 BASE) MCG/ACT IN AERS: 1.0000 | INHALATION_SPRAY | Freq: Four times a day (QID) | RESPIRATORY_TRACT | 0 refills | Status: DC | PRN

## 2019-06-20 NOTE — Telephone Encounter (Signed)
Called to Discuss with patient about Covid symptoms and the use of bamlanivimab, a monoclonal antibody infusion for those with mild to moderate Covid symptoms and at a high risk of hospitalization.     Pt is qualified for this infusion at the [facility] due to co-morbid conditions and/or a member of an at-risk group.     Unable to reach pt  

## 2019-06-20 NOTE — Telephone Encounter (Signed)
Called to Discuss with patient about Covid symptoms and the use of bamlanivimab, a monoclonal antibody infusion for those with mild to moderate Covid symptoms and at a high risk of hospitalization.     Pt states that symptoms started 3 weeks ago. 

## 2019-06-20 NOTE — ED Provider Notes (Signed)
[place]   [id number] 06/20/19 Arrival Time: 1001  Cc: COUGH  SUBJECTIVE:  Christina Cameron is a 54 y.o. female hx significant for bronchitis, diastolic CHF,high cholesterol, HLD, HTN, leaky heart valve, type 2 DM, who presents with persistent dry cough, fatigue, and chest tightness with cough x 3 weeks.  Symptoms were improving, and then worsened over the past week.  Tested positive for COVID 3 weeks ago.  Has tried albuterol inhaler and tessalon perles without relief.  Symptoms made worse with cough.  Denies fever, chills, sinus pain, rhinorrhea, sore throat, SOB, wheezing, chest pain, nausea, vomiting, changes in bowel or bladder habits.    ROS: As per HPI.  All other pertinent ROS negative.     Past Medical History:  Diagnosis Date  . Acute bronchitis, unspecified   . Arthritis    right knee  . Cancer ([place])    cervical 1989  . Chest pain    a. normal cors by cath in 11/2014  . Chronic diastolic (congestive) heart failure ([place])   . Diabetes mellitus without complication ([place])   . Enlarged heart   . High cholesterol   . Hyperlipidemia, unspecified   . Hypertension   . Leaky heart valve   . Morbid (severe) obesity due to excess calories ([place])   . Osteoarthritis of knee, unspecified   . Palpitations   . Raynaud's syndrome   . Somnolence   . SVT (supraventricular tachycardia) ([facility])    a. s/p ablation by Dr. Lovena Le in 2006.  . Tachycardia   . Type 2 diabetes mellitus with hyperglycemia ([place])   . Unspecified asthma, uncomplicated    Past Surgical History:  Procedure Laterality Date  . CARDIAC CATHETERIZATION    . CARDIAC CATHETERIZATION N/A 12/14/2014   Procedure: Right/Left Heart Cath and Coronary Angiography;  Surgeon: Sherren Mocha, MD;  Location: [facility];  Service: Cardiovascular;  Laterality: N/A;  . CARDIAC ELECTROPHYSIOLOGY STUDY AND ABLATION    . CESAREAN SECTION     x2  . ENDOMETRIAL ABLATION     No Known Allergies No current  facility-administered medications on file prior to encounter.   Current Outpatient Medications on File Prior to Encounter  Medication Sig Dispense Refill  . benzonatate (TESSALON PERLES) 100 MG capsule Take 2 capsules (200 mg total) by mouth 3 (three) times daily as needed for cough. FOR DISRUPTIVE DAY TIME COUGH 20 capsule 0  . cetirizine (ZYRTEC ALLERGY) 10 MG tablet Take 1 tablet (10 mg total) by mouth daily. 30 tablet 0  . desvenlafaxine (PRISTIQ) 50 MG 24 hr tablet Take 50 mg by mouth daily.    . Dulaglutide (TRULICITY) 1.5 0000000 SOPN Inject 1.5 mg into the skin every Sunday.     . fluticasone (FLONASE) 50 MCG/ACT nasal spray Place 1 spray into both nostrils daily for 14 days. (Patient not taking: Reported on 06/01/2019) 16 g 0  . furosemide (LASIX) 20 MG tablet Take 20 mg by mouth daily as needed for fluid.     . Insulin Glargine (LANTUS SOLOSTAR) 100 UNIT/ML Solostar Pen Inject 40 Units into the skin at bedtime. (Patient taking differently: Inject 40 Units into the skin 2 (two) times daily. ) 5 pen 2  . Insulin Pen Needle (B-D ULTRAFINE III SHORT PEN) 31G X 8 MM MISC 1 each by Does not apply route as directed. (Patient not taking: Reported on 06/01/2019) 100 each 3  . lisinopril (ZESTRIL) 5 MG tablet Take 1 tablet (5 mg total) by mouth daily. [place]  tablet 2  . metFORMIN (GLUCOPHAGE) 500 MG tablet Take 1 tablet (500 mg total) by mouth 2 (two) times daily with a meal. (Patient taking differently: Take 1,000 mg by mouth 2 (two) times daily with a meal. ) 60 tablet 1  . ranolazine (RANEXA) 500 MG 12 hr tablet Take 500 mg by mouth 2 (two) times daily.    . rosuvastatin (CRESTOR) 20 MG tablet Take 1 tablet (20 mg total) by mouth daily. (Patient taking differently: Take 20 mg by mouth every evening. ) 90 tablet 3    Social History   Socioeconomic History  . Marital status: Married    Spouse name: Not on file  . Number of children: Not on file  . Years of education: Not on file  . Highest  education level: Not on file  Occupational History  . Not on file  Tobacco Use  . Smoking status: Never Smoker  . Smokeless tobacco: Never Used  Substance and Sexual Activity  . Alcohol use: Yes    Comment: seldom  . Drug use: No  . Sexual activity: Yes    Birth control/protection: None  Other Topics Concern  . Not on file  Social History Narrative  . Not on file   Social Determinants of Health   Financial Resource Strain:   . Difficulty of Paying Living Expenses: Not on file  Food Insecurity:   . Worried About Charity fundraiser in the Last Year: Not on file  . Ran Out of Food in the Last Year: Not on file  Transportation Needs:   . Lack of Transportation (Medical): Not on file  . Lack of Transportation (Non-Medical): Not on file  Physical Activity:   . Days of Exercise per Week: Not on file  . Minutes of Exercise per Session: Not on file  Stress:   . Feeling of Stress : Not on file  Social Connections:   . Frequency of Communication with Friends and Family: Not on file  . Frequency of Social Gatherings with Friends and Family: Not on file  . Attends Religious Services: Not on file  . Active Member of Clubs or Organizations: Not on file  . Attends Archivist Meetings: Not on file  . Marital Status: Not on file  Intimate Partner Violence:   . Fear of Current or Ex-Partner: Not on file  . Emotionally Abused: Not on file  . Physically Abused: Not on file  . Sexually Abused: Not on file   Family History  Problem Relation Age of Onset  . Cancer Mother   . Cancer Father   . Stroke Brother   . Hypertension Brother      OBJECTIVE:  Vitals:   06/20/19 1019  BP: 124/83  Pulse: 86  Resp: 18  Temp: 98.6 F (37 C)  TempSrc: Oral  SpO2: 97%     General appearance: Alert, appears mildly fatigued, but nontoxic; speaking in full sentences without difficulty HEENT:NCAT; Ears: EACs clear, TMs pearly gray; Eyes: PERRL.  EOM grossly intact. Nose: nares  patent without rhinorrhea; Throat: tonsils nonerythematous or enlarged, uvula midline  Neck: supple without LAD Lungs: clear to auscultation bilaterally without adventitious breath sounds; normal respiratory effort; moderate cough present Heart: regular rate and rhythm.   Skin: warm and dry Psychological: alert and cooperative; normal mood and affect  DIAGNOSTIC STUDIES:  DG Chest 2 View  Result Date: 06/20/2019 CLINICAL DATA:  Progressive cough. COVID-19. EXAM: CHEST - 2 VIEW COMPARISON:  06/01/2019 and 08/09/2018 FINDINGS: The  heart size and mediastinal contours are within normal limits. Both lungs are clear except for slight peribronchial thickening. No effusions. The visualized skeletal structures are unremarkable. IMPRESSION: Slight bronchitic changes. Electronically Signed   By: Lorriane Shire M.D.   On: 06/20/2019 10:50    X-rays positive for possible bronchitic change  I have reviewed the x-rays myself and the radiologist interpretation. I am in agreement with the radiologist interpretation.     ASSESSMENT & PLAN:  1. COVID-19 virus infection   2. Persistent cough   3. Acute bronchitis, unspecified organism     Meds ordered this encounter  Medications  . predniSONE (STERAPRED UNI-PAK 21 TAB) 10 MG (21) TBPK tablet    Sig: Take by mouth daily. Take 6 tabs by mouth daily  for 2 days, then 5 tabs for 2 days, then 4 tabs for 2 days, then 3 tabs for 2 days, 2 tabs for 2 days, then 1 tab by mouth daily for 2 days    Dispense:  42 tablet    Refill:  0    Order Specific Question:   Supervising Provider    Answer:   Raylene Everts [id number]  . albuterol (VENTOLIN HFA) 108 (90 Base) MCG/ACT inhaler    Sig: Inhale 1-2 puffs into the lungs every 6 (six) hours as needed for wheezing or shortness of breath.    Dispense:  18 g    Refill:  0    Order Specific Question:   Supervising Provider    Answer:   Raylene Everts [id number]   Unable to rule out cardiac disease or blood  clot in urgent care setting.  Offered patient further evaluation and management in the ED.  Patient declines at this time and would like to try outpatient therapy first.  Aware of the risk associated with this decision including missed diagnosis, organ damage, organ failure, and/or death.  Patient aware and in agreement.     X-rays negative for pneumonia, but showed bronchitic changes Get plenty of rest and push fluids Use OTC medications as needed for symptomatic relief of fever and body aches Prednisone prescribed.  Take as prescribed and to completion.   Use inhaler as needed for shortness of breath/ or wheezing Follow up with PCP if symptoms persist Return or go to ER if you have any new or worsening symptoms fever, chills, nausea, vomiting, chest pain, chest pressure, shortness of breath, symptoms do not improve with medications, etc...   Reviewed expectations re: course of current medical issues. Questions answered. Outlined signs and symptoms indicating need for more acute intervention. Patient verbalized understanding. After Visit Summary given.          Lestine Box, PA-C 06/20/19 1115

## 2019-06-20 NOTE — ED Triage Notes (Signed)
Pt presents to UC w/ c/o chest tightness, dry cough, muscle weakness since testing positive for covid 3 weeks ago. Pt states these symptoms had gotten better, but in the last week have gotten worse. Pt's pcp recommends to be seen in office since their office is closed today.

## 2019-06-20 NOTE — Discharge Instructions (Addendum)
X-rays negative for pneumonia, but showed bronchitic changes Get plenty of rest and push fluids Use OTC medications as needed for symptomatic relief of fever and body aches Prednisone prescribed.  Take as prescribed and to completion.   Use inhaler as needed for shortness of breath/ or wheezing Follow up with PCP if symptoms persist Return or go to ER if you have any new or worsening symptoms fever, chills, nausea, vomiting, chest pain, chest pressure, shortness of breath, symptoms do not improve with medications, etc..Marland Kitchen

## 2019-06-23 ENCOUNTER — Telehealth: Payer: Self-pay | Admitting: "Endocrinology

## 2019-06-23 NOTE — Telephone Encounter (Signed)
Patient said she has Covid and is having issues. She is on steroids, which is causing her sugar to be 510 (Saturday). She has got it down in the 200's. What should she do?

## 2019-06-23 NOTE — Telephone Encounter (Signed)
Tried to call pt x 2 with no answer. I will continue to try to reach pt

## 2019-06-23 NOTE — Telephone Encounter (Signed)
Advised pt to increase her lantus to 60u nightly, continue her trulicity and metformin as prescribed, contact the office if her morning BS > 200 x 3 per Dr. Dorris Fetch. Understanding voiced per pt.

## 2019-06-23 NOTE — Telephone Encounter (Signed)
-   Advise her to increase her Lantus to 60 units nightly, continue to monitor blood glucose at least twice a day-daily before breakfast and at bedtime.   -continue Trulicity 1.5 mg subcutaneously weekly, Glucophage 500 mg  p.o. twice daily -call back if morning Bg >200 x 3

## 2019-06-25 ENCOUNTER — Telehealth: Payer: Self-pay | Admitting: Physician Assistant

## 2019-06-25 NOTE — Progress Notes (Signed)
Virtual Visit via Telephone Note   This visit type was conducted due to national recommendations for restrictions regarding the COVID-19 Pandemic (e.g. social distancing) in an effort to limit this patient's exposure and mitigate transmission in our community.  Due to her co-morbid illnesses, this patient is at least at moderate risk for complications without adequate follow up.  This format is felt to be most appropriate for this patient at this time.  The patient did not have access to video technology/had technical difficulties with video requiring transitioning to audio format only (telephone).  All issues noted in this document were discussed and addressed.  No physical exam could be performed with this format.  Please refer to the patient's chart for her  consent to telehealth for [facility].   Date:  06/30/2019   ID:  Christina Cameron, DOB Apr 27, 1966, MRN [id number]  Patient Location: Home Provider Location: Office  PCP:  Patient, No Pcp Per  Cardiologist:  Kate Sable, MD   Electrophysiologist:  None   Evaluation Performed:  Follow-Up Visit  Chief Complaint:  f/u  History of Present Illness:    Christina Cameron is a 54 y.o. female with history of noncardiac chest pain with normal coronary arteries in 2016, hypertension, hyperlipidemia, DM type II, palpitations status post ablation 2005 and morbid obesity.  Echocardiogram 2019 normal LVEF 60 to 65% with mild LVH and grade 2 DD.    Patient last seen by Dr. Bronson Ing 07/01/2018 at which time she was on Ranexa for chest pain that was felt to be noncardiac.  Blood pressure was mildly elevated.  Leg edema felt due to morbid obesity and abdominal venous compression on low-dose Lasix.  Patient was in the ED with Covid symptoms 06/20/2019 after positive test 06/18/2019.Patient was initially diagnosed in December with covid19 but symptoms didn't improve. She didn't qualify for the monoclonal antibody symptoms.Says her breathing isn't good from  covid19 but still having palpitations that seem to have worsened since covid. Occur everyday-sometimes skipping, sometimes rapid and associated chest tightness. Drinks 1 glass of caffeine a day. Feet and hands still swell.   The patient does not have symptoms concerning for COVID-19 infection (fever, chills, cough, or new shortness of breath).    Past Medical History:  Diagnosis Date  . Acute bronchitis, unspecified   . Arthritis    right knee  . Cancer ([place])    cervical 1989  . Chest pain    a. normal cors by cath in 11/2014  . Chronic diastolic (congestive) heart failure ([place])   . Diabetes mellitus without complication ([place])   . Enlarged heart   . High cholesterol   . Hyperlipidemia, unspecified   . Hypertension   . Leaky heart valve   . Morbid (severe) obesity due to excess calories ([place])   . Osteoarthritis of knee, unspecified   . Palpitations   . Raynaud's syndrome   . Somnolence   . SVT (supraventricular tachycardia) (HCC)    a. s/p ablation by Dr. Lovena Le in 2006.  . Tachycardia   . Type 2 diabetes mellitus with hyperglycemia ([place])   . Unspecified asthma, uncomplicated    Past Surgical History:  Procedure Laterality Date  . CARDIAC CATHETERIZATION    . CARDIAC CATHETERIZATION N/A 12/14/2014   Procedure: Right/Left Heart Cath and Coronary Angiography;  Surgeon: Sherren Mocha, MD;  Location: [facility];  Service: Cardiovascular;  Laterality: N/A;  . CARDIAC ELECTROPHYSIOLOGY STUDY AND ABLATION    . CESAREAN SECTION  x2  . ENDOMETRIAL ABLATION       Current Meds  Medication Sig  . albuterol (VENTOLIN HFA) 108 (90 Base) MCG/ACT inhaler Inhale 1-2 puffs into the lungs every 6 (six) hours as needed for wheezing or shortness of breath.  . benzonatate (TESSALON PERLES) 100 MG capsule Take 2 capsules (200 mg total) by mouth 3 (three) times daily as needed for cough. FOR DISRUPTIVE DAY TIME COUGH  . cetirizine (ZYRTEC ALLERGY) 10 MG tablet Take 1 tablet (10 mg  total) by mouth daily.  Marland Kitchen desvenlafaxine (PRISTIQ) 50 MG 24 hr tablet Take 50 mg by mouth daily.  . Dulaglutide (TRULICITY) 1.5 0000000 SOPN Inject 1.5 mg into the skin every Sunday.   . fluticasone (FLONASE) 50 MCG/ACT nasal spray Place 1 spray into both nostrils daily for 14 days.  . furosemide (LASIX) 20 MG tablet Take 20 mg by mouth daily as needed for fluid.   . Insulin Glargine (LANTUS SOLOSTAR) 100 UNIT/ML Solostar Pen Inject 40 Units into the skin at bedtime. (Patient taking differently: Inject 60 Units into the skin 2 (two) times daily. )  . lisinopril (ZESTRIL) 5 MG tablet Take 1 tablet (5 mg total) by mouth daily.  . metFORMIN (GLUCOPHAGE) 500 MG tablet Take 1 tablet (500 mg total) by mouth 2 (two) times daily with a meal. (Patient taking differently: Take 1,000 mg by mouth 2 (two) times daily with a meal. )  . ranolazine (RANEXA) 500 MG 12 hr tablet Take 500 mg by mouth 2 (two) times daily.  . rosuvastatin (CRESTOR) 20 MG tablet Take 1 tablet (20 mg total) by mouth daily. (Patient taking differently: Take 20 mg by mouth every evening. )     Allergies:   Patient has no known allergies.   Social History   Tobacco Use  . Smoking status: Never Smoker  . Smokeless tobacco: Never Used  Substance Use Topics  . Alcohol use: Yes    Comment: seldom  . Drug use: No     Family Hx: The patient's family history includes Cancer in her father and mother; Hypertension in her brother; Stroke in her brother.  ROS:   Please see the history of present illness.      All other systems reviewed and are negative.   Prior CV studies:   The following studies were reviewed today: Echocardiogram 04/12/2018 showed normal left ventricular systolic function, LVEF 60 to 65%, mild LVH, and grade 2 diastolic dysfunction.   Cardiac catheterization on 12/14/2014 demonstrated angiographically normal coronary arteries and normal intracardiac pressures.    Labs/Other Tests and Data Reviewed:    EKG:   An ECG dated 06/02/2019 was personally reviewed today and demonstrated:  Normal sinus rhythm with poor R wave progression anteriorly similar to prior EKGs  Recent Labs: 06/01/2019: ALT 50; BUN 17; Creatinine, Ser 0.52; Hemoglobin 14.9; Platelets 228; Potassium 4.2; Sodium 136   Recent Lipid Panel Lab Results  Component Value Date/Time   CHOL 245 (A) 06/21/2018 12:00 AM   TRIG 185 (A) 06/21/2018 12:00 AM   HDL 42 06/21/2018 12:00 AM   LDLCALC 169 06/21/2018 12:00 AM    Wt Readings from Last 3 Encounters:  06/30/19 285 lb (129.3 kg)  06/01/19 285 lb (129.3 kg)  05/29/19 285 lb (129.3 kg)     Objective:    Vital Signs:  Ht 5\' 4"  (1.626 m)   Wt 285 lb (129.3 kg)   BMI 48.92 kg/m    VITAL SIGNS:  reviewed  ASSESSMENT & PLAN:  1. History of atypical chest pain with normal coronary arteries on cardiac cath in 2016-continues to have chest pain but mostly with palpitations. On ranexa 2. History of palpitations status post ablation 2005, now with palpitations worse since covid19. Will send monitor. Add low dose metoprolol. Will also 3. Essential hypertension-sending her a cuff to check at home. 4. Hyperlipidemia LDL 169 trig 185 06/21/18 5. DM type II not well controlled now on insulin 6. Morbid obesity needs to lose weight. 7. [phone] positive with complications  [id number] Education: The signs and symptoms of COVID-19 were discussed with the patient and how to seek care for testing (follow up with PCP or arrange E-visit).   The importance of social distancing was discussed today.  Time:   Today, I have spent 16 minutes with the patient with telehealth technology discussing the above problems.     Medication Adjustments/Labs and Tests Ordered: Current medicines are reviewed at length with the patient today.  Concerns regarding medicines are outlined above.   Tests Ordered: Orders Placed This Encounter  Procedures  . Lipid Profile  . Cardiac event monitor  . ECHOCARDIOGRAM  COMPLETE    Medication Changes: Meds ordered this encounter  Medications  . metoprolol tartrate (LOPRESSOR) 25 MG tablet    Sig: Take 0.5 tablets (12.5 mg total) by mouth 2 (two) times daily.    Dispense:  45 tablet    Refill:  3    Follow Up:  In Person in 6 week(s)  Dr. Bronson Ing Signed, Ermalinda Barrios, PA-C  06/30/2019 2:48 PM    [place]

## 2019-06-25 NOTE — Telephone Encounter (Signed)

## 2019-06-26 ENCOUNTER — Ambulatory Visit: Payer: Self-pay | Admitting: Nutrition

## 2019-06-30 ENCOUNTER — Encounter: Payer: Self-pay | Admitting: Physician Assistant

## 2019-06-30 ENCOUNTER — Telehealth: Payer: Self-pay | Admitting: Licensed Clinical Social Worker

## 2019-06-30 ENCOUNTER — Telehealth (INDEPENDENT_AMBULATORY_CARE_PROVIDER_SITE_OTHER): Payer: Medicaid Other | Admitting: Physician Assistant

## 2019-06-30 VITALS — Ht 64.0 in | Wt 285.0 lb

## 2019-06-30 DIAGNOSIS — E785 Hyperlipidemia, unspecified: Secondary | ICD-10-CM | POA: Diagnosis not present

## 2019-06-30 DIAGNOSIS — R002 Palpitations: Secondary | ICD-10-CM

## 2019-06-30 DIAGNOSIS — I10 Essential (primary) hypertension: Secondary | ICD-10-CM | POA: Diagnosis not present

## 2019-06-30 DIAGNOSIS — R0600 Dyspnea, unspecified: Secondary | ICD-10-CM

## 2019-06-30 DIAGNOSIS — Z87898 Personal history of other specified conditions: Secondary | ICD-10-CM | POA: Diagnosis not present

## 2019-06-30 DIAGNOSIS — E1165 Type 2 diabetes mellitus with hyperglycemia: Secondary | ICD-10-CM

## 2019-06-30 MED ORDER — METOPROLOL TARTRATE 25 MG PO TABS: 12.5000 mg | ORAL_TABLET | Freq: Two times a day (BID) | ORAL | 3 refills | Status: DC

## 2019-06-30 NOTE — Patient Instructions (Signed)
Medication Instructions:  START Lopressor 12.5 mg twice a day   *If you need a refill on your cardiac medications before your next appointment, please call your pharmacy*  Lab Work: FASTING Lipids If you have labs (blood work) drawn today and your tests are completely normal, you will receive your results only by: Marland Kitchen MyChart Message (if you have MyChart) OR . A paper copy in the mail If you have any lab test that is abnormal or we need to change your treatment, we will call you to review the results.  Testing/Procedures: Your physician has requested that you have an echocardiogram. Echocardiography is a painless test that uses sound waves to create images of your heart. It provides your doctor with information about the size and shape of your heart and how well your heart's chambers and valves are working. This procedure takes approximately one hour. There are no restrictions for this procedure.  Your physician has recommended that you wear an event monitor for 72 hours . Event monitors are medical devices that record the heart's electrical activity. Doctors most often [place] these monitors to diagnose arrhythmias. Arrhythmias are problems with the speed or rhythm of the heartbeat. The monitor is a small, portable device. You can wear one while you do your normal daily activities. This is usually used to diagnose what is causing palpitations/syncope (passing out).    Follow-Up: At [facility], you and your health needs are our priority.  As part of our continuing mission to provide you with exceptional heart care, we have created designated Provider Care Teams.  These Care Teams include your primary Cardiologist (physician) and Advanced Practice Providers (APPs -  Physician Assistants and Nurse Practitioners) who all work together to provide you with the care you need, when you need it.  Your next appointment:   8 week(s)  The format for your next appointment:   In Person  Provider:     Kate Sable, MD  Other Instructions None     Thank you for choosing [facility] !

## 2019-06-30 NOTE — Telephone Encounter (Signed)
CSW referred to assist patient with obtaining a BP cuff. CSW contacted patient to inform cuff will be delivered to home. Patient grateful for support and assistance. CSW available as needed. Jackie Giani Betzold, LCSW, CCSW-MCS [phone]  

## 2019-07-04 ENCOUNTER — Ambulatory Visit: Payer: Medicaid Other

## 2019-07-04 DIAGNOSIS — R002 Palpitations: Secondary | ICD-10-CM

## 2019-07-21 ENCOUNTER — Ambulatory Visit (HOSPITAL_COMMUNITY)
Admission: RE | Admit: 2019-07-21 | Discharge: 2019-07-21 | Disposition: A | Discharge status: Home / Self Care | Payer: Medicaid Other | Source: Ambulatory Visit | Attending: Physician Assistant | Admitting: Physician Assistant

## 2019-07-21 ENCOUNTER — Other Ambulatory Visit: Payer: Self-pay

## 2019-07-21 ENCOUNTER — Ambulatory Visit: Payer: Medicaid Other | Attending: Internal Medicine

## 2019-07-21 DIAGNOSIS — Z20822 Contact with and (suspected) exposure to covid-19: Secondary | ICD-10-CM

## 2019-07-21 DIAGNOSIS — R0600 Dyspnea, unspecified: Secondary | ICD-10-CM | POA: Diagnosis present

## 2019-07-21 NOTE — Progress Notes (Signed)
*  PRELIMINARY RESULTS* Echocardiogram 2D Echocardiogram has been performed.  Leavy Cella 07/21/2019, 12:33 PM

## 2019-07-22 ENCOUNTER — Telehealth: Payer: Self-pay | Admitting: *Deleted

## 2019-07-22 ENCOUNTER — Telehealth: Payer: Self-pay

## 2019-07-22 ENCOUNTER — Ambulatory Visit: Payer: Medicaid Other | Admitting: "Endocrinology

## 2019-07-22 LAB — NOVEL CORONAVIRUS, NAA: SARS-CoV-2, NAA: NOT DETECTED

## 2019-07-22 NOTE — Telephone Encounter (Signed)
Patient received echo results.

## 2019-07-22 NOTE — Telephone Encounter (Signed)
Called patient with test results. No answer. Left message to call back.  

## 2019-07-22 NOTE — Telephone Encounter (Signed)
Pt called requesting call from [place].  She has a few questions.  Please call [phone]  Thanks renee

## 2019-08-06 ENCOUNTER — Ambulatory Visit: Payer: Medicaid Other | Admitting: "Endocrinology

## 2019-08-07 LAB — LIPID PANEL
Cholesterol: 229 mg/dL — ABNORMAL HIGH (ref <200)
HDL: 47 mg/dL — ABNORMAL LOW (ref >=50)
LDL Cholesterol (Calc): 152 mg/dL (calc) — ABNORMAL HIGH
Non-HDL Cholesterol (Calc): 182 mg/dL (calc) — ABNORMAL HIGH (ref <130)
Total CHOL/HDL Ratio: 4.9 (calc) (ref <5.0)
Triglycerides: 163 mg/dL — ABNORMAL HIGH (ref <150)

## 2019-08-08 ENCOUNTER — Telehealth: Payer: Self-pay | Admitting: *Deleted

## 2019-08-08 DIAGNOSIS — E785 Hyperlipidemia, unspecified: Secondary | ICD-10-CM

## 2019-08-08 MED ORDER — ROSUVASTATIN CALCIUM 40 MG PO TABS: 40.0000 mg | ORAL_TABLET | Freq: Every day | ORAL | 3 refills | Status: DC

## 2019-08-08 NOTE — Telephone Encounter (Signed)
Pt notified and orders placed  

## 2019-08-08 NOTE — Telephone Encounter (Signed)
-----   Message from Imogene Burn, [place] sent at 08/08/2019 10:38 AM EST ----- Lipids very high and not at goal. Would increase crestor 40 mg daily and repeat flp in 2 months. Has an appt with Dr. Raliegh Ip on 08/11/19 and can discuss further. thanks

## 2019-08-11 ENCOUNTER — Encounter: Payer: Self-pay | Admitting: Cardiovascular Disease

## 2019-08-11 ENCOUNTER — Telehealth (INDEPENDENT_AMBULATORY_CARE_PROVIDER_SITE_OTHER): Payer: Medicaid Other | Admitting: Cardiovascular Disease

## 2019-08-11 VITALS — BP 163/103 | HR 94 | Ht 64.0 in | Wt 278.0 lb

## 2019-08-11 DIAGNOSIS — R0789 Other chest pain: Secondary | ICD-10-CM

## 2019-08-11 DIAGNOSIS — R600 Localized edema: Secondary | ICD-10-CM

## 2019-08-11 DIAGNOSIS — R002 Palpitations: Secondary | ICD-10-CM | POA: Diagnosis not present

## 2019-08-11 DIAGNOSIS — E785 Hyperlipidemia, unspecified: Secondary | ICD-10-CM

## 2019-08-11 DIAGNOSIS — I519 Heart disease, unspecified: Secondary | ICD-10-CM

## 2019-08-11 DIAGNOSIS — I10 Essential (primary) hypertension: Secondary | ICD-10-CM

## 2019-08-11 MED ORDER — METOPROLOL TARTRATE 25 MG PO TABS: 25.0000 mg | ORAL_TABLET | Freq: Two times a day (BID) | ORAL | 6 refills | Status: DC

## 2019-08-11 MED ORDER — LISINOPRIL 10 MG PO TABS: 10.0000 mg | ORAL_TABLET | Freq: Every day | ORAL | 6 refills | Status: DC

## 2019-08-11 NOTE — Progress Notes (Signed)
Virtual Visit via Telephone Note   This visit type was conducted due to national recommendations for restrictions regarding the COVID-19 Pandemic (e.g. social distancing) in an effort to limit this patient's exposure and mitigate transmission in our community.  Due to her co-morbid illnesses, this patient is at least at moderate risk for complications without adequate follow up.  This format is felt to be most appropriate for this patient at this time.  The patient did not have access to video technology/had technical difficulties with video requiring transitioning to audio format only (telephone).  All issues noted in this document were discussed and addressed.  No physical exam could be performed with this format.  Please refer to the patient's chart for her  consent to telehealth for [facility].   The patient was identified using 2 identifiers.  Date:  08/11/2019   ID:  Christina Cameron, DOB Oct 18, 1965, MRN [id number]  Patient Location: Home Provider Location: Office  PCP:  Patient, No Pcp Per  Cardiologist:  Kate Sable, MD  Electrophysiologist:  None   Evaluation Performed:  Follow-Up Visit  Chief Complaint: Palpitations  History of Present Illness:    Christina Cameron is a 54 y.o. female with morbid obesity, noncardiac chest pain, shortness of breath, palpitations, SVT ablation in 2006, hypertension, type 2 diabetes mellitus, and hyperlipidemia.  She was diagnosed with COVID19 on 05/29/19. She's had problems with chest tightness, shortness of breath, palpitations, dizziness, and her eyes hurting since then.   She has also had burning in her feet and swelling of her hands.  Her blood pressures have been staying elevated as well.  She tested positive again on 07/21/19.  Chest xray on 06/20/19 showed slight bronchitic changes.  She does not have a PCP yet as Dr. Luan Pulling retired.  I gave her some recommendations.   Past Medical History:  Diagnosis Date  . Acute bronchitis,  unspecified   . Arthritis    right knee  . Cancer ([place])    cervical 1989  . Chest pain    a. normal cors by cath in 11/2014  . Chronic diastolic (congestive) heart failure ([place])   . Diabetes mellitus without complication ([place])   . Enlarged heart   . High cholesterol   . Hyperlipidemia, unspecified   . Hypertension   . Leaky heart valve   . Morbid (severe) obesity due to excess calories ([place])   . Osteoarthritis of knee, unspecified   . Palpitations   . Raynaud's syndrome   . Somnolence   . SVT (supraventricular tachycardia) (HCC)    a. s/p ablation by Dr. Lovena Le in 2006.  . Tachycardia   . Type 2 diabetes mellitus with hyperglycemia ([place])   . Unspecified asthma, uncomplicated    Past Surgical History:  Procedure Laterality Date  . CARDIAC CATHETERIZATION    . CARDIAC CATHETERIZATION N/A 12/14/2014   Procedure: Right/Left Heart Cath and Coronary Angiography;  Surgeon: Sherren Mocha, MD;  Location: [facility];  Service: Cardiovascular;  Laterality: N/A;  . CARDIAC ELECTROPHYSIOLOGY STUDY AND ABLATION    . CESAREAN SECTION     x2  . ENDOMETRIAL ABLATION       Current Meds  Medication Sig  . albuterol (VENTOLIN HFA) 108 (90 Base) MCG/ACT inhaler Inhale 1-2 puffs into the lungs every 6 (six) hours as needed for wheezing or shortness of breath.  . benzonatate (TESSALON PERLES) 100 MG capsule Take 2 capsules (200 mg total) by mouth 3 (three) times daily as needed for  cough. FOR DISRUPTIVE DAY TIME COUGH  . cetirizine (ZYRTEC ALLERGY) 10 MG tablet Take 1 tablet (10 mg total) by mouth daily.  Marland Kitchen desvenlafaxine (PRISTIQ) 50 MG 24 hr tablet Take 50 mg by mouth daily.  . Dulaglutide (TRULICITY) 1.5 0000000 SOPN Inject 1.5 mg into the skin every Sunday.   . fluticasone (FLONASE) 50 MCG/ACT nasal spray Place 1-2 sprays into both nostrils as needed for allergies or rhinitis.  . furosemide (LASIX) 20 MG tablet Take 20 mg by mouth daily as needed for fluid.   Marland Kitchen insulin glargine  (LANTUS) 100 UNIT/ML injection Inject 30 Units into the skin 2 (two) times daily.  Marland Kitchen lisinopril (ZESTRIL) 5 MG tablet Take 1 tablet (5 mg total) by mouth daily.  . metFORMIN (GLUCOPHAGE) 500 MG tablet Take 500 mg by mouth 2 (two) times daily with a meal.  . metoprolol tartrate (LOPRESSOR) 25 MG tablet Take 0.5 tablets (12.5 mg total) by mouth 2 (two) times daily.  . ranolazine (RANEXA) 500 MG 12 hr tablet Take 500 mg by mouth 2 (two) times daily.  . rosuvastatin (CRESTOR) 40 MG tablet Take 1 tablet (40 mg total) by mouth daily.     Allergies:   Patient has no known allergies.   Social History   Tobacco Use  . Smoking status: Never Smoker  . Smokeless tobacco: Never Used  Substance Use Topics  . Alcohol use: Yes    Comment: seldom  . Drug use: No     Family Hx: The patient's family history includes Cancer in her father and mother; Hypertension in her brother; Stroke in her brother.  ROS:   Please see the history of present illness.     All other systems reviewed and are negative.   Prior CV studies:   The following studies were reviewed today:  Cardiac catheterization on 12/14/2014 demonstrated angiographically normal coronary arteries and normal intracardiac pressures.   Echocardiogram 07/21/2019:  1. Left ventricular ejection fraction, by estimation, is 60 to 65%. The  left ventricle has normal function. The left ventricle has no regional  wall motion abnormalities. There is moderate concentric left ventricular  hypertrophy. Left ventricular  diastolic parameters are consistent with Grade I diastolic dysfunction  (impaired relaxation).  2. Right ventricular systolic function is normal. The right ventricular  size is mildly enlarged.  3. Left atrial size was mildly dilated.  4. The mitral valve is grossly normal. No evidence of mitral valve  regurgitation.  5. The aortic valve is grossly normal. Aortic valve regurgitation is not  visualized.  6. The inferior  vena cava is normal in size with greater than 50%  respiratory variability, suggesting right atrial pressure of 3 mmHg.   Labs/Other Tests and Data Reviewed:    EKG:  An ECG dated 06/01/19 was personally reviewed today and demonstrated:  sinus rhythm with left axis deviation and late R wave transition  Recent Labs: 06/01/2019: ALT 50; BUN 17; Creatinine, Ser 0.52; Hemoglobin 14.9; Platelets 228; Potassium 4.2; Sodium 136   Recent Lipid Panel Lab Results  Component Value Date/Time   CHOL 229 (H) 08/07/2019 08:14 AM   TRIG 163 (H) 08/07/2019 08:14 AM   HDL 47 (L) 08/07/2019 08:14 AM   CHOLHDL 4.9 08/07/2019 08:14 AM   LDLCALC 152 (H) 08/07/2019 08:14 AM    Wt Readings from Last 3 Encounters:  08/11/19 278 lb (126.1 kg)  06/30/19 285 lb (129.3 kg)  06/01/19 285 lb (129.3 kg)     Objective:    Vital Signs:  BP (!) 163/103   Pulse 94   Ht 5\' 4"  (1.626 m)   Wt 278 lb (126.1 kg)   BMI 47.72 kg/m    VITAL SIGNS:  reviewed  ASSESSMENT & PLAN:    1.  Chest pain: Normal coronary arteriesand intracardiac pressures.LV systolic function is normal as detailed above.  Currently on Ranexa 500 mg twice daily and she feels this helps.  She does not take Imdur.  Chest pain is noncardiac and current symptoms are likely part of the post COVID-19 syndrome.   2. Essential HTN:  BP is markedly elevated.  I will increase lisinopril to 10 mg daily.  3. Hyperlipidemia:  Lipids reviewed above and are found to be markedly elevated.  Rosuvastatin recently increased to 40 mg daily.  4. Bilateral leg edema: Diastolic dysfunction is likely due to morbid obesity and poorly controlled type 2 diabetes mellitus.  No evidence of frank diastolic decompensation.  Leg edema is likely due to morbid obesity and abdominal venous compression.  Diastolic dysfunction may also be playing a role.    Most recent echocardiogram reviewed above with now grade 1 diastolic dysfunction (previously grade 2).    She takes Lasix  20 mg as needed.   5. Morbid obesity: Needs aggressive weight loss.   6. Palpitations: Underwent SVT ablation in 2005. Event monitor ordered and pending.  She has had increased palpitations lately and this is likely the result of post COVID-19 syndrome.  I will increase Lopressor to 25 mg twice daily.   COVID-19 Education: The signs and symptoms of COVID-19 were discussed with the patient and how to seek care for testing (follow up with PCP or arrange E-visit).  The importance of social distancing was discussed today.  Time:   Today, I have spent 25 minutes with the patient with telehealth technology discussing the above problems.     Medication Adjustments/Labs and Tests Ordered: Current medicines are reviewed at length with the patient today.  Concerns regarding medicines are outlined above.   Tests Ordered: No orders of the defined types were placed in this encounter.   Medication Changes: No orders of the defined types were placed in this encounter.   Follow Up:  Virtual Visit  in 6 month(s)  Signed, Kate Sable, MD  08/11/2019 12:05 PM    [place]

## 2019-08-11 NOTE — Patient Instructions (Addendum)
Medication Instructions:   Increase Lisinopril to 10mg  daily.    Increase Lopressor to 25mg  twice a day    Continue all other medications.    Labwork: none  Testing/Procedures: none  Follow-Up: 6 months   Any Other Special Instructions Will Be Listed Below (If Applicable).  If you need a refill on your cardiac medications before your next appointment, please call your pharmacy.

## 2019-08-11 NOTE — Addendum Note (Signed)
Addended by: Laurine Blazer on: 08/11/2019 02:49 PM   Modules accepted: Orders

## 2019-08-12 ENCOUNTER — Telehealth: Payer: Self-pay

## 2019-08-12 NOTE — Telephone Encounter (Signed)
Received fax from [facility] stating patient requested cancellation of monitoring service.

## 2019-08-21 ENCOUNTER — Telehealth: Payer: Medicaid Other | Admitting: Emergency Medicine

## 2019-08-21 DIAGNOSIS — R300 Dysuria: Secondary | ICD-10-CM | POA: Diagnosis not present

## 2019-08-21 MED ORDER — CEPHALEXIN 500 MG PO CAPS: 500.0000 mg | ORAL_CAPSULE | Freq: Two times a day (BID) | ORAL | 0 refills | Status: AC

## 2019-08-21 NOTE — Progress Notes (Signed)

## 2019-08-26 ENCOUNTER — Ambulatory Visit: Payer: Medicaid Other | Admitting: "Endocrinology

## 2019-09-02 ENCOUNTER — Telehealth: Payer: Medicaid Other | Admitting: Physician Assistant

## 2019-09-02 DIAGNOSIS — B3731 Acute candidiasis of vulva and vagina: Secondary | ICD-10-CM

## 2019-09-02 DIAGNOSIS — B373 Candidiasis of vulva and vagina: Secondary | ICD-10-CM | POA: Diagnosis not present

## 2019-09-02 MED ORDER — FLUCONAZOLE 150 MG PO TABS: 150.0000 mg | ORAL_TABLET | Freq: Once | ORAL | 0 refills | Status: AC

## 2019-09-02 NOTE — Progress Notes (Signed)
Please contact your PCP regarding any potential medication interactions with Diflucan.   We are sorry that you are not feeling well. Here is how we plan to help! Based on what you shared with me it looks like you: May have a yeast vaginosis  Vaginosis is an inflammation of the vagina that can result in discharge, itching and pain. The cause is usually a change in the normal balance of vaginal bacteria or an infection. Vaginosis can also result from reduced estrogen levels after menopause.  The most common causes of vaginosis are:   Bacterial vaginosis which results from an overgrowth of one on several organisms that are normally present in your vagina.   Yeast infections which are caused by a naturally occurring fungus called candida.   Vaginal atrophy (atrophic vaginosis) which results from the thinning of the vagina from reduced estrogen levels after menopause.   Trichomoniasis which is caused by a parasite and is commonly transmitted by sexual intercourse.  Factors that increase your risk of developing vaginosis include: Marland Kitchen Medications, such as antibiotics and steroids . Uncontrolled diabetes . Use of hygiene products such as bubble bath, vaginal spray or vaginal deodorant . Douching . Wearing damp or tight-fitting clothing . Using an intrauterine device (IUD) for birth control . Hormonal changes, such as those associated with pregnancy, birth control pills or menopause . Sexual activity . Having a sexually transmitted infection  Your treatment plan is A single Diflucan (fluconazole) 150mg  tablet once.  I have electronically sent this prescription into the pharmacy that you have chosen.  Be sure to take all of the medication as directed. Stop taking any medication if you develop a rash, tongue swelling or shortness of breath. Mothers who are breast feeding should consider pumping and discarding their breast milk while on these antibiotics. However, there is no consensus that infant  exposure at these doses would be harmful.  Remember that medication creams can weaken latex condoms. Marland Kitchen   HOME CARE:  Good hygiene may prevent some types of vaginosis from recurring and may relieve some symptoms:  . Avoid baths, hot tubs and whirlpool spas. Rinse soap from your outer genital area after a shower, and dry the area well to prevent irritation. Don't use scented or harsh soaps, such as those with deodorant or antibacterial action. Marland Kitchen Avoid irritants. These include scented tampons and pads. . Wipe from front to back after using the toilet. Doing so avoids spreading fecal bacteria to your vagina.  Other things that may help prevent vaginosis include:  Marland Kitchen Don't douche. Your vagina doesn't require cleansing other than normal bathing. Repetitive douching disrupts the normal organisms that reside in the vagina and can actually increase your risk of vaginal infection. Douching won't clear up a vaginal infection. . Use a latex condom. Both female and female latex condoms may help you avoid infections spread by sexual contact. . Wear cotton underwear. Also wear pantyhose with a cotton crotch. If you feel comfortable without it, skip wearing underwear to bed. Yeast thrives in [employer] Your symptoms should improve in the next day or two.  GET HELP RIGHT AWAY IF:  . You have pain in your lower abdomen ( pelvic area or over your ovaries) . You develop nausea or vomiting . You develop a fever . Your discharge changes or worsens . You have persistent pain with intercourse . You develop shortness of breath, a rapid pulse, or you faint.  These symptoms could be signs of problems or infections that need to  be evaluated by a medical provider now.  MAKE SURE YOU    Understand these instructions.  Will watch your condition.  Will get help right away if you are not doing well or get worse.  Your e-visit answers were reviewed by a board certified advanced clinical practitioner to  complete your personal care plan. Depending upon the condition, your plan could have included both over the counter or prescription medications. Please review your pharmacy choice to make sure that you have choses a pharmacy that is open for you to pick up any needed prescription, Your safety is important to [place]. If you have drug allergies check your prescription carefully.   You can use MyChart to ask questions about today's visit, request a non-urgent call back, or ask for a work or school excuse for 24 hours related to this e-Visit. If it has been greater than 24 hours you will need to follow up with your provider, or enter a new e-Visit to address those concerns. You will get a MyChart message within the next two days asking about your experience. I hope that your e-visit has been valuable and will speed your recovery.  Greater than 5 minutes, yet less than 10 minutes of time have been spent researching, coordinating and implementing care for this patient today.

## 2019-09-15 ENCOUNTER — Encounter: Payer: Self-pay | Admitting: Family Medicine

## 2019-09-15 ENCOUNTER — Other Ambulatory Visit: Payer: Self-pay

## 2019-09-15 ENCOUNTER — Ambulatory Visit (INDEPENDENT_AMBULATORY_CARE_PROVIDER_SITE_OTHER): Payer: Medicaid Other | Admitting: Family Medicine

## 2019-09-15 VITALS — BP 133/84 | HR 81 | Temp 97.9°F | Ht 64.0 in | Wt 276.8 lb

## 2019-09-15 DIAGNOSIS — F419 Anxiety disorder, unspecified: Secondary | ICD-10-CM | POA: Diagnosis not present

## 2019-09-15 DIAGNOSIS — R0609 Other forms of dyspnea: Secondary | ICD-10-CM

## 2019-09-15 DIAGNOSIS — R002 Palpitations: Secondary | ICD-10-CM

## 2019-09-15 DIAGNOSIS — R0600 Dyspnea, unspecified: Secondary | ICD-10-CM | POA: Diagnosis not present

## 2019-09-15 DIAGNOSIS — M179 Osteoarthritis of knee, unspecified: Secondary | ICD-10-CM

## 2019-09-15 DIAGNOSIS — J4530 Mild persistent asthma, uncomplicated: Secondary | ICD-10-CM | POA: Diagnosis not present

## 2019-09-15 MED ORDER — DESVENLAFAXINE SUCCINATE ER 50 MG PO TB24: 50.0000 mg | ORAL_TABLET | Freq: Every day | ORAL | 2 refills | Status: DC

## 2019-09-15 MED ORDER — FUROSEMIDE 20 MG PO TABS: 20.0000 mg | ORAL_TABLET | Freq: Every day | ORAL | 3 refills | Status: DC | PRN

## 2019-09-15 NOTE — Patient Instructions (Addendum)
  Refer to pulmonary for additional evaluation-Short of Breath post COVID with h/o asthma-taking albuterol Refer to counseling -concern for anxiety -taking Pristig with questionable control Refills on medication Take Crestor daily then recheck in 3 months-goal LDL cholesterol 70 Consider follow up with Weight loss specialist   If you have lab work done today you will be contacted with your lab results within the next 2 weeks.  If you have not heard from [place] then please contact us. The fastest way to get your results is to register for My Chart.   IF you received an x-ray today, you will receive an invoice from [facility]. Please contact [facility] at [phone] with questions or concerns regarding your invoice.   IF you received labwork today, you will receive an invoice from [facility]. Please contact [facility] at [phone] with questions or concerns regarding your invoice.   Our billing staff will not be able to assist you with questions regarding bills from these companies.  You will be contacted with the lab results as soon as they are available. The fastest way to get your results is to activate your My Chart account. Instructions are located on the last page of this paperwork. If you have not heard from [place] regarding the results in 2 weeks, please contact this office.

## 2019-09-15 NOTE — Progress Notes (Signed)
New Patient Office Visit  Subjective:  Patient ID: Christina Cameron, female    DOB: [birth year]  Age: 54 y.o. MRN: [id number]   echo Left ventricular ejection fraction, by estimation, is 60 to 65%. The left ventricle has normal function. The left ventricle has no regional wall motion abnormalities. There is moderate concentric left ventricular hypertrophy. Left ventricular diastolic parameters are consistent with Grade I diastolic dysfunction (impaired relaxation). 2. Right ventricular systolic function is normal. The right ventricular size is mildly enlarged. 3. Left atrial size was mildly dilated. 4. The mitral valve is grossly normal. No evidence of mitral valve regurgitation. 5. The aortic valve is grossly normal. Aortic valve regurgitation is not visualized. 6. The inferior vena cava is normal in size with greater than 50% respiratory variability, suggesting right atrial pressure of 3 mmHg. CC:  Chief Complaint  Patient presents with  . Establish Care    Need refills on pended meds. Also still have issues with sob post covid  DM/HTN/Asthma-+COVID 06/18/19, neg 07/21/19  HPI Wallie Char presents for  Right knee needs replacement-ortho following for surgery-needs weight loss-surgery recommended in the past, no current medication or diet management Cardiology-palpitations/CHF-Ranex, Lopressor, Lasix, Zestril-cardio following Endo-glucophage, Trulicity AR-flonase as needed, no recent use zyrtec Hyperlipidemia-Crestor +COVID-pt did not use bamlanivimab-past 10 days for infusion-06/18/19 diagnosis, retested 07/21/19-pt with asthma-after COVID worsening symptoms-albuterol MDI-using 1-2 times a day. Pt states continued SOB, occasional wheezing-pt not taking inhaled steroids-pt with no pulmonary follow since Dr. Luan Pulling retirement Oxygen sat 97%-ER 1/21 Past Medical History:  Diagnosis Date  . Acute bronchitis, unspecified   . Arthritis    right knee  . Cancer ([place])    cervical 1989  .  Chest pain    a. normal cors by cath in 11/2014  . Chronic diastolic (congestive) heart failure ([place])   . Diabetes mellitus without complication ([place])   . Enlarged heart   . High cholesterol   . Hyperlipidemia, unspecified   . Hypertension   . Leaky heart valve   . Morbid (severe) obesity due to excess calories ([place])   . Osteoarthritis of knee, unspecified   . Palpitations   . Raynaud's syndrome   . Somnolence   . SVT (supraventricular tachycardia) ([facility])    a. s/p ablation by Dr. Lovena Le in 2006.  . Tachycardia   . Type 2 diabetes mellitus with hyperglycemia ([place])   . Unspecified asthma, uncomplicated     Past Surgical History:  Procedure Laterality Date  . CARDIAC CATHETERIZATION    . CARDIAC CATHETERIZATION N/A 12/14/2014   Procedure: Right/Left Heart Cath and Coronary Angiography;  Surgeon: Sherren Mocha, MD;  Location: [facility];  Service: Cardiovascular;  Laterality: N/A;  . CARDIAC ELECTROPHYSIOLOGY STUDY AND ABLATION    . CESAREAN SECTION     x2  . ENDOMETRIAL ABLATION      Family History  Problem Relation Age of Onset  . Cancer Mother   . Cancer Father   . Stroke Brother   . Hypertension Brother     Social History   Socioeconomic History  . Marital status: Married    Spouse name: Not on file  . Number of children: Not on file  . Years of education: Not on file  . Highest education level: Not on file  Occupational History  . Not on file  Tobacco Use  . Smoking status: Never Smoker  . Smokeless tobacco: Never Used  Substance and Sexual Activity  . Alcohol use: Yes  Comment: seldom  . Drug use: No  . Sexual activity: Yes    Birth control/protection: None  Other Topics Concern  . Not on file  Social History Narrative  . Not on file   Social Determinants of Health   Financial Resource Strain:   . Difficulty of Paying Living Expenses:   Food Insecurity:   . Worried About Charity fundraiser in the Last Year:   . Arboriculturist in the  Last Year:   Transportation Needs:   . Film/video editor (Medical):   Marland Kitchen Lack of Transportation (Non-Medical):   Physical Activity:   . Days of Exercise per Week:   . Minutes of Exercise per Session:   Stress:   . Feeling of Stress :   Social Connections:   . Frequency of Communication with Friends and Family:   . Frequency of Social Gatherings with Friends and Family:   . Attends Religious Services:   . Active Member of Clubs or Organizations:   . Attends Archivist Meetings:   Marland Kitchen Marital Status:   Intimate Partner Violence:   . Fear of Current or Ex-Partner:   . Emotionally Abused:   Marland Kitchen Physically Abused:   . Sexually Abused:     ROS Review of Systems  Constitutional: Positive for fatigue and unexpected weight change.  HENT: Positive for congestion, dental problem and sinus pressure.   Eyes: Positive for pain.  Respiratory: Positive for cough, chest tightness and shortness of breath.   Cardiovascular: Positive for palpitations and leg swelling.  Gastrointestinal: Positive for diarrhea.  Endocrine: Positive for polyphagia.  Genitourinary: Positive for dyspareunia, dysuria, pelvic pain and vaginal discharge.  Musculoskeletal: Positive for arthralgias, myalgias, neck pain and neck stiffness.  Neurological: Positive for dizziness, light-headedness and headaches.  Psychiatric/Behavioral: Positive for decreased concentration and dysphoric mood. The patient is nervous/anxious.     Objective:   Today's Vitals: BP 133/84 (BP Location: Right Arm, Patient Position: Sitting, Cuff Size: Large)   Pulse 81   Temp 97.9 F (36.6 C) (Temporal)   Ht 5\' 4"  (1.626 m)   Wt 276 lb 12.8 oz (125.6 kg)   SpO2 96%   BMI 47.51 kg/m   Physical Exam Constitutional:      Appearance: She is obese.  Cardiovascular:     Rate and Rhythm: Normal rate and regular rhythm.     Pulses: Normal pulses.     Heart sounds: Normal heart sounds.  Pulmonary:     Effort: Pulmonary effort is  normal.     Breath sounds: Normal breath sounds.  Musculoskeletal:     Right lower leg: Edema present.     Left lower leg: Edema present.  Neurological:     Mental Status: She is alert and oriented to person, place, and time.  Psychiatric:        Mood and Affect: Mood normal.        Behavior: Behavior normal.     Assessment & Plan:  1. DOE (dyspnea on exertion) Concern for continued SOB with cough post COVID - Ambulatory referral to Pulmonology No pneumonia noted on cxr-oxygen sat 97% in ER, sat 96%ER 2. Mild persistent asthma without complication H/o COVID-continued SOB and cough with use of albuterol - Ambulatory referral to Pulmonology 3. Anxiety D/w pt concern for increase in anxiety-taking Pristiq daily - Ambulatory referral to Psychology  4. Osteoarthritis of knee, unspecified Ortho following-recommended replacement but weight loss needed for surgery  5. Morbid (severe) obesity due to excess  calories [facility]) Referral to Dr. Anastasio Champion for follow up-weight loss needed for ortho surgery  6. Palpitations Cardio following  Outpatient Encounter Medications as of 09/15/2019  Medication Sig  . albuterol (VENTOLIN HFA) 108 (90 Base) MCG/ACT inhaler Inhale 1-2 puffs into the lungs every 6 (six) hours as needed for wheezing or shortness of breath.  . cetirizine (ZYRTEC ALLERGY) 10 MG tablet Take 1 tablet (10 mg total) by mouth daily.  Marland Kitchen desvenlafaxine (PRISTIQ) 50 MG 24 hr tablet Take 50 mg by mouth daily.  . Dulaglutide (TRULICITY) 1.5 0000000 SOPN Inject 1.5 mg into the skin every Sunday.   . fluticasone (FLONASE) 50 MCG/ACT nasal spray Place 1-2 sprays into both nostrils as needed for allergies or rhinitis.  . furosemide (LASIX) 20 MG tablet Take 20 mg by mouth daily as needed for fluid.   Marland Kitchen insulin glargine (LANTUS) 100 UNIT/ML injection Inject 30 Units into the skin 2 (two) times daily.  Marland Kitchen lisinopril (ZESTRIL) 10 MG tablet Take 1 tablet (10 mg total) by mouth daily.  .  metFORMIN (GLUCOPHAGE) 500 MG tablet Take 500 mg by mouth 2 (two) times daily with a meal.  . metoprolol tartrate (LOPRESSOR) 25 MG tablet Take 1 tablet (25 mg total) by mouth 2 (two) times daily.  . ranolazine (RANEXA) 500 MG 12 hr tablet Take 500 mg by mouth 2 (two) times daily.  . rosuvastatin (CRESTOR) 40 MG tablet Take 1 tablet (40 mg total) by mouth daily.  . [DISCONTINUED] benzonatate (TESSALON PERLES) 100 MG capsule Take 2 capsules (200 mg total) by mouth 3 (three) times daily as needed for cough. FOR DISRUPTIVE DAY TIME COUGH   No facility-administered encounter medications on file as of 09/15/2019.    Follow-up:   Renalda Locklin Hannah Beat, MD

## 2019-09-17 ENCOUNTER — Ambulatory Visit (INDEPENDENT_AMBULATORY_CARE_PROVIDER_SITE_OTHER): Payer: Medicaid Other | Admitting: Internal Medicine

## 2019-09-17 ENCOUNTER — Encounter: Payer: Self-pay | Admitting: Internal Medicine

## 2019-09-17 ENCOUNTER — Other Ambulatory Visit: Payer: Self-pay

## 2019-09-17 DIAGNOSIS — E1165 Type 2 diabetes mellitus with hyperglycemia: Secondary | ICD-10-CM | POA: Diagnosis not present

## 2019-09-17 DIAGNOSIS — I471 Supraventricular tachycardia: Secondary | ICD-10-CM | POA: Diagnosis not present

## 2019-09-17 DIAGNOSIS — R05 Cough: Secondary | ICD-10-CM | POA: Diagnosis not present

## 2019-09-17 DIAGNOSIS — I5032 Chronic diastolic (congestive) heart failure: Secondary | ICD-10-CM

## 2019-09-17 DIAGNOSIS — R058 Other specified cough: Secondary | ICD-10-CM | POA: Insufficient documentation

## 2019-09-17 DIAGNOSIS — I10 Essential (primary) hypertension: Secondary | ICD-10-CM

## 2019-09-17 MED ORDER — IRBESARTAN 150 MG PO TABS: 150.0000 mg | ORAL_TABLET | Freq: Every day | ORAL | 11 refills | Status: DC

## 2019-09-17 NOTE — Assessment & Plan Note (Signed)
Onset 2017 while on acei?  Upper airway cough syndrome (previously labeled PNDS),  is so named because it's frequently impossible to sort out how much is  CR/sinusitis with freq throat clearing (which can be related to primary GERD)   vs  causing  secondary (" extra esophageal")  GERD from wide swings in gastric pressure that occur with throat clearing, often  promoting self use of mint and menthol lozenges that reduce the lower esophageal sphincter tone and exacerbate the problem further in a cyclical fashion.   These are the same pts (now being labeled as having "irritable larynx syndrome" by some cough centers) who not infrequently have a history of having failed to tolerate ace inhibitors,  dry powder inhalers or biphosphonates or report having atypical/extraesophageal reflux symptoms that don't respond to standard doses of PPI  and are easily confused as having aecopd or asthma flares by even experienced allergists/ pulmonologists (myself included).   rec first stop the acei and reduce the saba back to prn   - The proper method of use, as well as anticipated side effects, of a metered-dose inhaler were discussed and demonstrated to the patient.    >>> advised to return with all meds/inhalers in hand using a trust but verify approach to confirm accurate Medication  Reconciliation The principal here is that until we are certain that the  patients are doing what we've asked, it makes no sense to ask them to do more.

## 2019-09-17 NOTE — Assessment & Plan Note (Signed)
D/c acei 09/17/2019 due to pseudoasthma   In the best review of chronic cough to date ( NEJM 2016 375 [id number]) ,  ACEi are now felt to cause cough in up to  20% of pts which is a 4 fold increase from previous reports and does not include the variety of non-specific complaints we see in pulmonary clinic in pts on ACEi but previously attributed to another dx like  Copd/asthma and  include PNDS, throat and chest congestion, "bronchitis", unexplained dyspnea and noct "strangling" sensations, and hoarseness, but also  atypical /refractory GERD symptoms like dysphagia and "bad heartburn"   The only way I know  to prove this is not an "ACEi Case" is a trial off ACEi x a minimum of 4weeks then regroup.           Each maintenance medication was reviewed in detail including emphasizing most importantly the difference between maintenance and prns and under what circumstances the prns are to be triggered using an action plan format where appropriate.  Total time for H and P, chart review, counseling, teaching device and generating customized AVS unique to this office visit / charting = 60 min

## 2019-09-17 NOTE — Progress Notes (Signed)
Christina Cameron, female    DOB: 23-Dec-1965,   MRN: [id number]   Brief patient profile:  40 yowf never smoker with longstanding HBP and no allergy/asthma symptoms until started needing prn   albuterol since around 2017 then much worse since covid 19 Dec 27th with cough and sob  rx as outpt with prednisone x 2 days then had to stop due to sugars up but persistent sense of chest tightness some better but having to take  albuterol around the clock so referred to pulmonary clinic 09/17/2019 by Dr   Holly Bodily       History of Present Illness  09/17/2019  Pulmonary/ 1st office eval/Burr Soffer on ACEi with atypical asthma hx  Chief Complaint  Patient presents with  . Pulmonary Consult    Referred by Dr Benny Lennert. Pt states has history of Asthma and increased SOB since had covid 19 end of Dec 2020.  She c/o heaviness in her chest and SOB with exertion, wakes up in the night feeling SOB.   Dyspnea:  MMRC3 = can't walk 100 yards even at a slow pace at a flat grade s stopping due to sob eg [employer]  Cough:describes as was" very croupy"  better since recovered from covid 19/ never productive Sleep: bed is flat / 2 pillows and wakes up sob and sometimes uses saba and sometimes not SABA use:  None on day of ov but avg tid    No obvious day to day or daytime variability or assoc excess/ purulent sputum or mucus plugs or hemoptysis or cp or chest tightness, subjective wheeze or overt sinus or hb symptoms.     Also denies any obvious fluctuation of symptoms with weather or environmental changes or other aggravating or alleviating factors except as outlined above   No unusual exposure hx or h/o childhood pna/ asthma or knowledge of premature birth.  Current Allergies, Complete Past Medical History, Past Surgical History, Family History, and Social History were reviewed in [employer] record.  ROS  The following are not active complaints unless bolded Hoarseness, sore throat, dysphagia/globus,  dental problems, itching, sneezing,  nasal congestion or discharge of excess mucus or purulent secretions, ear ache,   fever, chills, sweats, unintended wt loss or wt gain, classically pleuritic or exertional cp,  orthopnea pnd or arm/hand swelling  or leg swelling, presyncope, palpitations, abdominal pain, anorexia, nausea, vomiting, diarrhea  or change in bowel habits or change in bladder habits, change in stools or change in urine, dysuria, hematuria,  rash, arthralgias, visual complaints, headache, numbness, weakness or ataxia or problems with walking or coordination,  change in mood or  memory.             Past Medical History:  Diagnosis Date  . Acute bronchitis, unspecified   . Arthritis    right knee  . Cancer ([place])    cervical 1989  . Chest pain    a. normal cors by cath in 11/2014  . Chronic diastolic (congestive) heart failure ([place])   . Diabetes mellitus without complication ([place])   . Enlarged heart   . High cholesterol   . Hyperlipidemia, unspecified   . Hypertension   . Leaky heart valve   . Morbid (severe) obesity due to excess calories ([place])   . Osteoarthritis of knee, unspecified   . Palpitations   . Raynaud's syndrome   . Somnolence   . SVT (supraventricular tachycardia) ([facility])    a. s/p ablation by Dr. Lovena Le in 2006.  Marland Kitchen  Tachycardia   . Type 2 diabetes mellitus with hyperglycemia ([place])   . Unspecified asthma, uncomplicated     Outpatient Medications Prior to Visit  Medication Sig Dispense Refill  . albuterol (VENTOLIN HFA) 108 (90 Base) MCG/ACT inhaler Inhale 1-2 puffs into the lungs every 6 (six) hours as needed for wheezing or shortness of breath. 18 g 0  . cetirizine (ZYRTEC ALLERGY) 10 MG tablet Take 1 tablet (10 mg total) by mouth daily. (Patient taking differently: Take 10 mg by mouth daily as needed. ) 30 tablet 0  . desvenlafaxine (PRISTIQ) 50 MG 24 hr tablet Take 1 tablet (50 mg total) by mouth daily. 30 tablet 2  . Dulaglutide (TRULICITY) 1.5 0000000  SOPN Inject 1.5 mg into the skin every Sunday.     . fluticasone (FLONASE) 50 MCG/ACT nasal spray Place 1-2 sprays into both nostrils as needed for allergies or rhinitis.    . furosemide (LASIX) 20 MG tablet Take 1 tablet (20 mg total) by mouth daily as needed for fluid. 30 tablet 3  . insulin glargine (LANTUS) 100 UNIT/ML injection Inject 30 Units into the skin 2 (two) times daily.    Marland Kitchen lisinopril (ZESTRIL) 10 MG tablet Take 1 tablet (10 mg total) by mouth daily. (Patient taking differently: Take 10 mg by mouth in the morning and at bedtime. ) 30 tablet 6  . metFORMIN (GLUCOPHAGE) 500 MG tablet Take 500 mg by mouth 2 (two) times daily with a meal.    . metoprolol tartrate (LOPRESSOR) 25 MG tablet Take 1 tablet (25 mg total) by mouth 2 (two) times daily. 60 tablet 6  . ranolazine (RANEXA) 500 MG 12 hr tablet Take 500 mg by mouth 2 (two) times daily.    . rosuvastatin (CRESTOR) 40 MG tablet Take 1 tablet (40 mg total) by mouth daily. 90 tablet 3   No facility-administered medications prior to visit.     Objective:     BP 136/82 (BP Location: Left Arm, Cuff Size: Large)   Pulse 72   Temp (!) 97.1 F (36.2 C) (Temporal)   Ht 5\' 4"  (1.626 m)   Wt 279 lb (126.6 kg)   SpO2 97% Comment: on RA  BMI 47.89 kg/m   SpO2: 97 %(on RA)   amb obese wf nad with classic pseudowheeze    HEENT : pt wearing mask not removed for exam due to covid -19 concerns.    NECK :  without JVD/Nodes/TM/ nl carotid upstrokes bilaterally   LUNGS: no acc muscle use,  Nl contour chest which is clear to A and P bilaterally without cough on insp or exp maneuvers   CV:  RRR  no s3 or murmur or increase in P2, and no edema   ABD: obese  soft and nontender with nl inspiratory excursion in the supine position. No bruits or organomegaly appreciated, bowel sounds nl  MS:  Nl gait/ ext warm without deformities, calf tenderness, cyanosis or clubbing No obvious joint restrictions   SKIN: warm and dry without lesions     NEURO:  alert, approp, nl sensorium with  no motor or cerebellar deficits apparent.     I personally reviewed images and agree with radiology impression as follows:  CXR:  PA and LAt 06/20/19 Slight bronchitic changes.    Assessment   Upper airway cough syndrome vs atypical asthma/ cough variant  Onset 2017 while on acei?  Upper airway cough syndrome (previously labeled PNDS),  is so named because it's frequently impossible to sort out  how much is  CR/sinusitis with freq throat clearing (which can be related to primary GERD)   vs  causing  secondary (" extra esophageal")  GERD from wide swings in gastric pressure that occur with throat clearing, often  promoting self use of mint and menthol lozenges that reduce the lower esophageal sphincter tone and exacerbate the problem further in a cyclical fashion.   These are the same pts (now being labeled as having "irritable larynx syndrome" by some cough centers) who not infrequently have a history of having failed to tolerate ace inhibitors,  dry powder inhalers or biphosphonates or report having atypical/extraesophageal reflux symptoms that don't respond to standard doses of PPI  and are easily confused as having aecopd or asthma flares by even experienced allergists/ pulmonologists (myself included).   rec first stop the acei and reduce the saba back to prn   - The proper method of use, as well as anticipated side effects, of a metered-dose inhaler were discussed and demonstrated to the patient.    >>> advised to return with all meds/inhalers in hand using a trust but verify approach to confirm accurate Medication  Reconciliation The principal here is that until we are certain that the  patients are doing what we've asked, it makes no sense to ask them to do more.      Essential hypertension, benign D/c acei 09/17/2019 due to pseudoasthma   In the best review of chronic cough to date ( NEJM 2016 375 [id number]) ,  ACEi are now felt to  cause cough in up to  20% of pts which is a 4 fold increase from previous reports and does not include the variety of non-specific complaints we see in pulmonary clinic in pts on ACEi but previously attributed to another dx like  Copd/asthma and  include PNDS, throat and chest congestion, "bronchitis", unexplained dyspnea and noct "strangling" sensations, and hoarseness, but also  atypical /refractory GERD symptoms like dysphagia and "bad heartburn"   The only way I know  to prove this is not an "ACEi Case" is a trial off ACEi x a minimum of 4weeks then regroup.        Each maintenance medication was reviewed in detail including emphasizing most importantly the difference between maintenance and prns and under what circumstances the prns are to be triggered using an action plan format where appropriate.  Total time for H and P, chart review, counseling, teaching device and generating customized AVS unique to this office visit / charting = 60 min        Christinia Gully, MD 09/17/2019

## 2019-09-17 NOTE — Patient Instructions (Addendum)
Stop zestoril (lisinopril)  - call [facility] when it was first started vs when you started albuterol   Avapro (ibasartan)  150 mg  one daily on a trial basis and you should gradually improve   Only use your albuterol as a rescue medication to be used if you can't catch your breath by resting or doing a relaxed purse lip breathing pattern.  - The less you use it, the better it will work when you need it. - Ok to use up to 2 puffs  every 4 hours if you must but call for immediate appointment if use goes up over your usual need - Don't leave home without it !!  (think of it like the spare tire for your car)    Try albuterol 15 min before an activity that you know would make you short of breath and see if it makes any difference and if makes none then don't take it after activity unless you can't catch your breath.   Please schedule a follow up office visit in 4 weeks, sooner if needed  with all medications /inhalers/ solutions in hand so we can verify exactly what you are taking. This includes all medications from all doctors and over the counters

## 2019-09-23 ENCOUNTER — Ambulatory Visit (INDEPENDENT_AMBULATORY_CARE_PROVIDER_SITE_OTHER): Payer: Medicaid Other

## 2019-09-23 ENCOUNTER — Other Ambulatory Visit: Payer: Self-pay

## 2019-09-23 DIAGNOSIS — R002 Palpitations: Secondary | ICD-10-CM | POA: Diagnosis not present

## 2019-10-06 ENCOUNTER — Ambulatory Visit: Payer: Medicaid Other | Admitting: "Endocrinology

## 2019-10-17 ENCOUNTER — Ambulatory Visit: Payer: Medicaid Other | Admitting: Internal Medicine

## 2019-10-21 ENCOUNTER — Telehealth: Payer: Medicaid Other | Admitting: Physician Assistant

## 2019-10-21 ENCOUNTER — Telehealth: Payer: Self-pay

## 2019-10-21 DIAGNOSIS — B373 Candidiasis of vulva and vagina: Secondary | ICD-10-CM | POA: Diagnosis not present

## 2019-10-21 DIAGNOSIS — B3731 Acute candidiasis of vulva and vagina: Secondary | ICD-10-CM

## 2019-10-21 MED ORDER — FLUCONAZOLE 150 MG PO TABS: 150.0000 mg | ORAL_TABLET | Freq: Once | ORAL | 0 refills | Status: AC

## 2019-10-21 MED ORDER — METOPROLOL TARTRATE 50 MG PO TABS: 50.0000 mg | ORAL_TABLET | Freq: Two times a day (BID) | ORAL | 3 refills | Status: DC

## 2019-10-21 NOTE — Telephone Encounter (Signed)
Can increase metoprolol to 50 mg bid

## 2019-10-21 NOTE — Telephone Encounter (Signed)
Pt notified to increase dose to 50 mg bid

## 2019-10-21 NOTE — Progress Notes (Signed)
We are sorry that you are not feeling well. Here is how we plan to help! Based on what you shared with me it looks like you: May have a yeast vaginosis  Vaginosis is an inflammation of the vagina that can result in discharge, itching and pain. The cause is usually a change in the normal balance of vaginal bacteria or an infection. Vaginosis can also result from reduced estrogen levels after menopause.  The most common causes of vaginosis are:   Bacterial vaginosis which results from an overgrowth of one on several organisms that are normally present in your vagina.   Yeast infections which are caused by a naturally occurring fungus called candida.   Vaginal atrophy (atrophic vaginosis) which results from the thinning of the vagina from reduced estrogen levels after menopause.   Trichomoniasis which is caused by a parasite and is commonly transmitted by sexual intercourse.  Factors that increase your risk of developing vaginosis include: . Medications, such as antibiotics and steroids . Uncontrolled diabetes . Use of hygiene products such as bubble bath, vaginal spray or vaginal deodorant . Douching . Wearing damp or tight-fitting clothing . Using an intrauterine device (IUD) for birth control . Hormonal changes, such as those associated with pregnancy, birth control pills or menopause . Sexual activity . Having a sexually transmitted infection  Your treatment plan is A single Diflucan (fluconazole) 150mg tablet once.  I have electronically sent this prescription into the pharmacy that you have chosen.  Be sure to take all of the medication as directed. Stop taking any medication if you develop a rash, tongue swelling or shortness of breath. Mothers who are breast feeding should consider pumping and discarding their breast milk while on these antibiotics. However, there is no consensus that infant exposure at these doses would be harmful.  Remember that medication creams can weaken latex  condoms. .   HOME CARE:  Good hygiene may prevent some types of vaginosis from recurring and may relieve some symptoms:  . Avoid baths, hot tubs and whirlpool spas. Rinse soap from your outer genital area after a shower, and dry the area well to prevent irritation. Don't use scented or harsh soaps, such as those with deodorant or antibacterial action. . Avoid irritants. These include scented tampons and pads. . Wipe from front to back after using the toilet. Doing so avoids spreading fecal bacteria to your vagina.  Other things that may help prevent vaginosis include:  . Don't douche. Your vagina doesn't require cleansing other than normal bathing. Repetitive douching disrupts the normal organisms that reside in the vagina and can actually increase your risk of vaginal infection. Douching won't clear up a vaginal infection. . Use a latex condom. Both female and female latex condoms may help you avoid infections spread by sexual contact. . Wear cotton underwear. Also wear pantyhose with a cotton crotch. If you feel comfortable without it, skip wearing underwear to bed. Yeast thrives in moist environments Your symptoms should improve in the next day or two.  GET HELP RIGHT AWAY IF:  . You have pain in your lower abdomen ( pelvic area or over your ovaries) . You develop nausea or vomiting . You develop a fever . Your discharge changes or worsens . You have persistent pain with intercourse . You develop shortness of breath, a rapid pulse, or you faint.  These symptoms could be signs of problems or infections that need to be evaluated by a medical provider now.  MAKE SURE YOU      Understand these instructions.  Will watch your condition.  Will get help right away if you are not doing well or get worse.  Your e-visit answers were reviewed by a board certified advanced clinical practitioner to complete your personal care plan. Depending upon the condition, your plan could have included  both over the counter or prescription medications. Please review your pharmacy choice to make sure that you have choses a pharmacy that is open for you to pick up any needed prescription, Your safety is important to us. If you have drug allergies check your prescription carefully.   You can use MyChart to ask questions about today's visit, request a non-urgent call back, or ask for a work or school excuse for 24 hours related to this e-Visit. If it has been greater than 24 hours you will need to follow up with your provider, or enter a new e-Visit to address those concerns. You will get a MyChart message within the next two days asking about your experience. I hope that your e-visit has been valuable and will speed your recovery.  Greater than 5 minutes, yet less than 10 minutes of time have been spent researching, coordinating and implementing care for this patient today.   

## 2019-10-21 NOTE — Telephone Encounter (Signed)
Patient is still having palpitations. Taking  lopressor 25 mg BID, please advise

## 2019-10-21 NOTE — Telephone Encounter (Signed)
-----   Message from Laurine Blazer, LPN sent at [id number]  4:36 PM EDT -----  ----- Message ----- From: Murrell Converse Sent: 10/20/2019   4:27 PM EDT To: Laurine Blazer, LPN  Monitor showed Rare skipping a a brief run of atrial tachycardia. Ask her if her symptoms are better since adding metoprolol. We can always increase if needed. thanks

## 2019-10-28 ENCOUNTER — Telehealth: Payer: Medicaid Other | Admitting: Physician Assistant

## 2019-10-28 DIAGNOSIS — B001 Herpesviral vesicular dermatitis: Secondary | ICD-10-CM | POA: Diagnosis not present

## 2019-10-28 MED ORDER — VALACYCLOVIR HCL 1 G PO TABS: 2000.0000 mg | ORAL_TABLET | Freq: Two times a day (BID) | ORAL | 0 refills | Status: AC

## 2019-10-28 NOTE — Progress Notes (Signed)
We are sorry that you are not feeling well.  Here is how we plan to help!  Based on what you have shared with me it does look like you have a viral infection.    Most cold sores or fever blisters are small fluid filled blisters around the mouth caused by herpes simplex virus.  The most common strain of the virus causing cold sores is herpes simplex virus 1.  It can be spread by skin contact, sharing eating utensils, or even sharing towels.  Cold sores are contagious to other people until dry. (Approximately 5-7 days).  Wash your hands. You can spread the virus to your eyes through handling your contact lenses after touching the lesions.  Most people experience pain at the sight or tingling sensations in their lips that may begin before the ulcers erupt.  Herpes simplex is treatable but not curable.  It may lie dormant for a long time and then reappear due to stress or prolonged sun exposure.  Many patients have success in treating their cold sores with an over the counter topical called Abreva.  You may apply the cream up to 5 times daily (maximum 10 days) until healing occurs.  If you would like to use an oral antiviral medication to speed the healing of your cold sore, I have sent a prescription to your local pharmacy Valacyclovir 2 gm take one by mouth twice a day for 1 day    HOME CARE:   Wash your hands frequently.  Do not pick at or rub the sore.  Don't open the blisters.  Avoid kissing other people during this time.  Avoid sharing drinking glasses, eating utensils, or razors.  Do not handle contact lenses unless you have thoroughly washed your hands with soap and warm water!  Avoid oral sex during this time.  Herpes from sores on your mouth can spread to your partner's genital area.  Avoid contact with anyone who has eczema or a weakened immune system.  Cold sores are often triggered by exposure to intense sunlight, use a lip balm containing a sunscreen (SPF 30 or  higher).  GET HELP RIGHT AWAY IF:   Blisters look infected.  Blisters occur near or in the eye.  Symptoms last longer than 10 days.  Your symptoms become worse.  MAKE SURE YOU:   Understand these instructions.  Will watch your condition.  Will get help right away if you are not doing well or get worse.    Your e-visit answers were reviewed by a board certified advanced clinical practitioner to complete your personal care plan.  Depending upon the condition, your plan could have  Included both over the counter or prescription medications.    Please review your pharmacy choice.  Be sure that the pharmacy you have chosen is open so that you can pick up your prescription now.  If there is a problem you can message your provider in MyChart to have the prescription routed to another pharmacy.    Your safety is important to us.  If you have drug allergies check our prescription carefully.  For the next 24 hours you can use MyChart to ask questions about today's visit, request a non-urgent call back, or ask for a work or school excuse from your e-visit provider.  You will get an email in the next two days asking about your experience.  I hope that your e-visit has been valuable and will speed your recovery.   Greater than 5 minutes, yet less   than 10 minutes of time have been spent researching, coordinating and implementing care for this patient today.    

## 2019-11-20 ENCOUNTER — Telehealth: Payer: Medicaid Other | Admitting: Physician Assistant

## 2019-11-20 DIAGNOSIS — B3731 Acute candidiasis of vulva and vagina: Secondary | ICD-10-CM

## 2019-11-20 DIAGNOSIS — B373 Candidiasis of vulva and vagina: Secondary | ICD-10-CM

## 2019-11-20 DIAGNOSIS — R399 Unspecified symptoms and signs involving the genitourinary system: Secondary | ICD-10-CM

## 2019-11-20 MED ORDER — FLUCONAZOLE 150 MG PO TABS: 150.0000 mg | ORAL_TABLET | Freq: Once | ORAL | 0 refills | Status: AC

## 2019-11-20 MED ORDER — CEPHALEXIN 500 MG PO CAPS: 500.0000 mg | ORAL_CAPSULE | Freq: Two times a day (BID) | ORAL | 0 refills | Status: AC

## 2019-11-20 NOTE — Progress Notes (Signed)
Hi Christina Cameron,   I am sorry you are not feeling well.  I am happy to hear you have an appointment with your GYN to discuss these problems.  Be sure to keep this appointment, even if you are feeling fine at the time.    We are sorry that you are not feeling well.  Here is how we plan to help!  Based on what you shared with me it looks like you most likely have a simple urinary tract infection.  A UTI (Urinary Tract Infection) is a bacterial infection of the bladder.  Most cases of urinary tract infections are simple to treat but a key part of your care is to encourage you to drink plenty of fluids and watch your symptoms carefully.  I have prescribed Keflex 500 mg twice a day for 7 days.  Your symptoms should gradually improve. Call us if the burning in your urine worsens, you develop worsening fever, back pain or pelvic pain or if your symptoms do not resolve after completing the antibiotic.  I will send in Diflucan in case you experience symptoms of a yeast infection.   Urinary tract infections can be prevented by drinking plenty of water to keep your body hydrated.  Also be sure when you wipe, wipe from front to back and don't hold it in!  If possible, empty your bladder every 4 hours.  Your e-visit answers were reviewed by a board certified advanced clinical practitioner to complete your personal care plan.  Depending on the condition, your plan could have included both over the counter or prescription medications.  If there is a problem please reply  once you have received a response from your provider.  Your safety is important to [place].  If you have drug allergies check your prescription carefully.    You can use MyChart to ask questions about today's visit, request a non-urgent call back, or ask for a work or school excuse for 24 hours related to this e-Visit. If it has been greater than 24 hours you will need to follow up with your provider, or enter a new e-Visit to address those  concerns.   You will get an e-mail in the next two days asking about your experience.  I hope that your e-visit has been valuable and will speed your recovery. Thank you for using e-visits.   Greater than 5 minutes, yet less than 10 minutes of time have been spent researching, coordinating and implementing care for this patient today.

## 2019-11-27 ENCOUNTER — Telehealth: Payer: Medicaid Other | Admitting: Emergency Medicine

## 2019-11-27 DIAGNOSIS — R6883 Chills (without fever): Secondary | ICD-10-CM

## 2019-11-27 DIAGNOSIS — M549 Dorsalgia, unspecified: Secondary | ICD-10-CM

## 2019-11-27 DIAGNOSIS — R300 Dysuria: Secondary | ICD-10-CM

## 2019-11-27 NOTE — Progress Notes (Signed)
Based on what you shared with me, I feel your condition warrants further evaluation and I recommend that you be seen for a face to face office visit.  Your persistent symptoms, chills, back pain, and medical history are all concerning.  You should be seen in-person. You may need to have blood work done to check your kidney function along with a formal urinalysis and urine culture to figure out what is going on.  I'm sorry, I can't adequately evaluate or treat you through an e-visit.   NOTE: If you entered your credit card information for this eVisit, you will not be charged. You may see a "hold" on your card for the $35 but that hold will drop off and you will not have a charge processed.   If you are having a true medical emergency please call 911.      For an urgent face to face visit, [facility] has five urgent care centers for your convenience:      NEW:  [facility] [place] [facility] [place] Get Driving Directions [phone] [address], [place] [address] . 10 am - 6pm Monday - Friday    [facility]) Get Driving Directions [phone] [address] [place], [place] [address] . 10 am to 8 pm Monday-Friday . 12 pm to 8 pm [facility] Get Driving Directions [phone] [place], [place], [place] [address] . 8 am to 8 pm Monday-Friday . 9 am to 6 pm Saturday . 11 am to 6 pm Sunday     [facility] at [facility] Get Driving Directions  [phone] [address] [place], [place] [address] . 8 am to 8 pm Monday-Friday . 8 am to 4 pm [facility] at [place] Get Driving Directions [phone] [address] [place], [place] [address] . 12 pm to 6 pm Monday-Friday      Your e-visit answers were reviewed by a board certified advanced clinical practitioner to  complete your personal care plan.  Thank you for using e-Visits.    Approximately 5 minutes was used in reviewing the patient's chart, questionnaire, prescribing medications, and documentation.

## 2019-11-28 ENCOUNTER — Encounter: Payer: Self-pay | Admitting: Emergency Medicine

## 2019-11-28 ENCOUNTER — Ambulatory Visit: Payer: Self-pay

## 2019-11-28 ENCOUNTER — Ambulatory Visit
Admission: EM | Admit: 2019-11-28 | Discharge: 2019-11-28 | Disposition: A | Discharge status: Home / Self Care | Payer: Medicaid Other | Attending: Emergency Medicine | Admitting: Emergency Medicine

## 2019-11-28 DIAGNOSIS — R109 Unspecified abdominal pain: Secondary | ICD-10-CM | POA: Insufficient documentation

## 2019-11-28 DIAGNOSIS — M545 Low back pain, unspecified: Secondary | ICD-10-CM

## 2019-11-28 LAB — POCT URINALYSIS DIP (MANUAL ENTRY)
Bilirubin, UA: NEGATIVE
Glucose, UA: 500 mg/dL — AB
Ketones, POC UA: NEGATIVE mg/dL
Leukocytes, UA: NEGATIVE
Nitrite, UA: NEGATIVE
Protein Ur, POC: NEGATIVE mg/dL
Spec Grav, UA: >=1.03 — AB (ref 1.010–1.025)
Urobilinogen, UA: 0.2 E.U./dL
pH, UA: 5.5 (ref 5.0–8.0)

## 2019-11-28 MED ORDER — FLUCONAZOLE 150 MG PO TABS
150.0000 mg | ORAL_TABLET | Freq: Once | ORAL | 0 refills | Status: AC
Start: 2019-11-28 — End: 2019-11-28

## 2019-11-28 NOTE — ED Triage Notes (Signed)
Just finished abx tx for uti.  Pt c/o low back pain and RT flank pain now.  Hx of frequent utis

## 2019-11-28 NOTE — Discharge Instructions (Signed)
POCT urine analysis was negative Urine culture sent.  We will call you with the results.   Push fluids and get plenty of rest.   Diflucan was prescribed for possible yeast infection Follow up with PCP if symptoms persists Return here or go to ER if you have any new or worsening symptoms such as fever, worsening abdominal pain, nausea/vomiting, flank pain, etc

## 2019-11-28 NOTE — ED Provider Notes (Addendum)
[facility]   Chief Complaint  Patient presents with  . Urinary Tract Infection     SUBJECTIVE:  Christina Cameron is a 54 y.o. female who presented to the urgent care with a complaint of low back pain and right flank pain for the past few days.  Patient reports she just finished antibiotic treatment for UTI with 1 Diflucan for possible yeast.  Denies a precipitating event, recent sexual encounter, excessive caffeine intake.  Localizes the pain to the lower abdomen/ flank.  Pain is intermittent and describes it as sharp.  Has tried OTC medications without relief.  Symptoms are made worse with urination.  Admits to similar symptoms in the past.  Denies fever, chills, nausea, vomiting, abnormal vaginal discharge or bleeding, hematuria.    LMP: No LMP recorded. Patient has had an ablation.  ROS: As in HPI.  All other pertinent ROS negative.     Past Medical History:  Diagnosis Date  . Acute bronchitis, unspecified   . Arthritis    right knee  . Cancer ([place])    cervical 1989  . Chest pain    a. normal cors by cath in 11/2014  . Chronic diastolic (congestive) heart failure ([place])   . Diabetes mellitus without complication ([place])   . Enlarged heart   . High cholesterol   . Hyperlipidemia, unspecified   . Hypertension   . Leaky heart valve   . Morbid (severe) obesity due to excess calories ([place])   . Osteoarthritis of knee, unspecified   . Palpitations   . Raynaud's syndrome   . Somnolence   . SVT (supraventricular tachycardia) (HCC)    a. s/p ablation by Dr. Lovena Le in 2006.  . Tachycardia   . Type 2 diabetes mellitus with hyperglycemia ([place])   . Unspecified asthma, uncomplicated    Past Surgical History:  Procedure Laterality Date  . CARDIAC CATHETERIZATION    . CARDIAC CATHETERIZATION N/A 12/14/2014   Procedure: Right/Left Heart Cath and Coronary Angiography;  Surgeon: Sherren Mocha, MD;  Location: [facility];  Service: Cardiovascular;  Laterality: N/A;  .  CARDIAC ELECTROPHYSIOLOGY STUDY AND ABLATION    . CESAREAN SECTION     x2  . ENDOMETRIAL ABLATION     No Known Allergies No current facility-administered medications on file prior to encounter.   Current Outpatient Medications on File Prior to Encounter  Medication Sig Dispense Refill  . albuterol (VENTOLIN HFA) 108 (90 Base) MCG/ACT inhaler Inhale 1-2 puffs into the lungs every 6 (six) hours as needed for wheezing or shortness of breath. 18 g 0  . cetirizine (ZYRTEC ALLERGY) 10 MG tablet Take 1 tablet (10 mg total) by mouth daily. (Patient taking differently: Take 10 mg by mouth daily as needed. ) 30 tablet 0  . desvenlafaxine (PRISTIQ) 50 MG 24 hr tablet Take 1 tablet (50 mg total) by mouth daily. 30 tablet 2  . Dulaglutide (TRULICITY) 1.5 OI/7.1IW SOPN Inject 1.5 mg into the skin every Sunday.     . fluticasone (FLONASE) 50 MCG/ACT nasal spray Place 1-2 sprays into both nostrils as needed for allergies or rhinitis.    . furosemide (LASIX) 20 MG tablet Take 1 tablet (20 mg total) by mouth daily as needed for fluid. 30 tablet 3  . insulin glargine (LANTUS) 100 UNIT/ML injection Inject 30 Units into the skin 2 (two) times daily.    . irbesartan (AVAPRO) 150 MG tablet Take 1 tablet (150 mg total) by mouth daily. 30 tablet 11  .  metFORMIN (GLUCOPHAGE) 500 MG tablet Take 500 mg by mouth 2 (two) times daily with a meal.    . metoprolol tartrate (LOPRESSOR) 50 MG tablet Take 1 tablet (50 mg total) by mouth 2 (two) times daily. 180 tablet 3  . ranolazine (RANEXA) 500 MG 12 hr tablet Take 500 mg by mouth 2 (two) times daily.    . rosuvastatin (CRESTOR) 40 MG tablet Take 1 tablet (40 mg total) by mouth daily. 90 tablet 3   Social History   Socioeconomic History  . Marital status: Married    Spouse name: Not on file  . Number of children: Not on file  . Years of education: Not on file  . Highest education level: Not on file  Occupational History  . Not on file  Tobacco Use  . Smoking status:  Never Smoker  . Smokeless tobacco: Never Used  Vaping Use  . Vaping Use: Never used  Substance and Sexual Activity  . Alcohol use: Yes    Comment: seldom  . Drug use: No  . Sexual activity: Yes    Birth control/protection: None  Other Topics Concern  . Not on file  Social History Narrative  . Not on file   Social Determinants of Health   Financial Resource Strain:   . Difficulty of Paying Living Expenses:   Food Insecurity:   . Worried About Charity fundraiser in the Last Year:   . Arboriculturist in the Last Year:   Transportation Needs:   . Film/video editor (Medical):   Marland Kitchen Lack of Transportation (Non-Medical):   Physical Activity:   . Days of Exercise per Week:   . Minutes of Exercise per Session:   Stress:   . Feeling of Stress :   Social Connections:   . Frequency of Communication with Friends and Family:   . Frequency of Social Gatherings with Friends and Family:   . Attends Religious Services:   . Active Member of Clubs or Organizations:   . Attends Archivist Meetings:   Marland Kitchen Marital Status:   Intimate Partner Violence:   . Fear of Current or Ex-Partner:   . Emotionally Abused:   Marland Kitchen Physically Abused:   . Sexually Abused:    Family History  Problem Relation Age of Onset  . Cancer Mother   . Cancer Father   . Stroke Brother   . Hypertension Brother     OBJECTIVE:  Vitals:   11/28/19 1442 11/28/19 1505  BP: 127/78   Pulse: 86   Resp: 16   Temp: 97.9 F (36.6 C)   TempSrc: Oral   SpO2: 98%   Weight:  271 lb (122.9 kg)  Height:  5\' 4"  (1.626 m)   General appearance: AOx3 in no acute distress HEENT: NCAT.  Oropharynx clear.  Lungs: clear to auscultation bilaterally without adventitious breath sounds Heart: regular rate and rhythm.  Radial pulses 2+ symmetrical bilaterally Abdomen: soft; non-distended; no tenderness; bowel sounds present; no guarding or rebound tenderness Back: no CVA tenderness Extremities: no edema; symmetrical  with no gross deformities Skin: warm and dry Neurologic: Ambulates from chair to exam table without difficulty Psychological: alert and cooperative; normal mood and affect  Labs Reviewed  POCT URINALYSIS DIP (MANUAL ENTRY) - Abnormal; Notable for the following components:      Result Value   Glucose, UA =500 (*)    Spec Grav, UA >=1.030 (*)    Blood, UA trace-intact (*)    All other components  within normal limits  URINE CULTURE    ASSESSMENT & PLAN:  1. Right flank pain   2. Acute bilateral low back pain without sciatica     Meds ordered this encounter  Medications  . fluconazole (DIFLUCAN) 150 MG tablet    Sig: Take 1 tablet (150 mg total) by mouth once for 1 dose. May take the second dose 72 hours after the first if symptom does not resolve    Dispense:  2 tablet    Refill:  0  Patient  is stable at discharge.  She just completed a course of Keflex with 1 Diflucan.  Her symptom is less likely from UTI.  She is a diabetic and therefore can develop yeast.  Currently does not have a yeast infection symptoms.  Was offer candidiasis and BV screening test patient declined.  KUB test was also ordered patient declined  Discharge Instructions POCT urine analysis was negative Urine culture sent.  We will call you with the results.   Push fluids and get plenty of rest.   Diflucan was prescribed for possible yeast infection Follow up with PCP if symptoms persists Return here or go to ER if you have any new or worsening symptoms such as fever, worsening abdominal pain, nausea/vomiting, flank pain, etc...  Outlined signs and symptoms indicating need for more acute intervention. Patient verbalized understanding. After Visit Summary given.    Note: This document was prepared using Dragon voice recognition software and may include unintentional dictation errors.    Emerson Monte, FNP 11/28/19 1540    Emerson Monte, FNP 11/28/19 1542

## 2019-11-30 LAB — URINE CULTURE: Culture: 80000 — AB

## 2019-12-02 NOTE — Progress Notes (Signed)
Call in antibiotics.  Amoxicillin 875 BID for a week/  Follow up with PCP

## 2019-12-03 ENCOUNTER — Telehealth (HOSPITAL_COMMUNITY): Payer: Self-pay | Admitting: Emergency Medicine

## 2019-12-03 MED ORDER — AMOXICILLIN 500 MG PO CAPS
500.0000 mg | ORAL_CAPSULE | Freq: Three times a day (TID) | ORAL | 0 refills | Status: AC
Start: 2019-12-03 — End: 2019-12-10

## 2019-12-03 NOTE — Telephone Encounter (Signed)
Urine culture positive and reviewed by Dr. Lanny Cramp.  He sent in the prescription for the appropriate antibiotic.  Patient called, verified identity using two identifiers, and updated on culture results and treatment.  All questions answered.  Pharmacy confirmed.  Patient verbalized understanding.

## 2019-12-05 ENCOUNTER — Other Ambulatory Visit: Payer: Self-pay | Admitting: Orthopedic Surgery

## 2019-12-05 ENCOUNTER — Other Ambulatory Visit (HOSPITAL_COMMUNITY): Payer: Self-pay | Admitting: Orthopedic Surgery

## 2019-12-05 DIAGNOSIS — M79662 Pain in left lower leg: Secondary | ICD-10-CM

## 2019-12-12 ENCOUNTER — Ambulatory Visit (HOSPITAL_COMMUNITY): Payer: Medicaid Other

## 2019-12-15 ENCOUNTER — Ambulatory Visit (HOSPITAL_COMMUNITY): Payer: Medicaid Other

## 2019-12-18 ENCOUNTER — Telehealth: Payer: Medicaid Other | Admitting: Family

## 2019-12-18 DIAGNOSIS — R102 Pelvic and perineal pain unspecified side: Secondary | ICD-10-CM

## 2019-12-18 NOTE — Progress Notes (Signed)
Based on what you shared with me, I feel your condition warrants further evaluation and I recommend that you be seen for a face to face visit.  Please contact your primary care physician practice to be seen. Many offices offer virtual options to be seen via video if you are not comfortable going in person to a medical facility at this time.  I am concerned about your belly pain.  If you do not have a PCP, [facility] offers a free physician referral service available at [phone]. Our trained staff has the experience, knowledge and resources to put you in touch with a physician who is right for you.   You also have the option of a video visit through [email or web address]  If you are having a true medical emergency please call 911.  NOTE: If you entered your credit card information for this eVisit, you will not be charged. You may see a "hold" on your card for the $35 but that hold will drop off and you will not have a charge processed.  Your e-visit answers were reviewed by a board certified advanced clinical practitioner to complete your personal care plan.  Thank you for using e-Visits.

## 2019-12-19 ENCOUNTER — Ambulatory Visit (HOSPITAL_COMMUNITY): Payer: Medicaid Other

## 2019-12-24 ENCOUNTER — Other Ambulatory Visit: Payer: Self-pay

## 2019-12-24 ENCOUNTER — Ambulatory Visit
Admission: RE | Admit: 2019-12-24 | Discharge: 2019-12-24 | Disposition: A | Discharge status: Home / Self Care | Payer: Medicaid Other | Source: Ambulatory Visit | Attending: Emergency Medicine | Admitting: Emergency Medicine

## 2019-12-24 VITALS — BP 118/62 | HR 88 | Temp 99.0°F | Resp 17 | Ht 64.0 in | Wt 275.0 lb

## 2019-12-24 DIAGNOSIS — R05 Cough: Secondary | ICD-10-CM

## 2019-12-24 DIAGNOSIS — R059 Cough, unspecified: Secondary | ICD-10-CM

## 2019-12-24 DIAGNOSIS — Z20822 Contact with and (suspected) exposure to covid-19: Secondary | ICD-10-CM

## 2019-12-24 DIAGNOSIS — J069 Acute upper respiratory infection, unspecified: Secondary | ICD-10-CM

## 2019-12-24 MED ORDER — BENZONATATE 100 MG PO CAPS
100.0000 mg | ORAL_CAPSULE | Freq: Three times a day (TID) | ORAL | 0 refills | Status: DC
Start: 2019-12-24 — End: 2020-05-02

## 2019-12-24 NOTE — ED Triage Notes (Addendum)
Cough that started on Sunday, now has body aches, headache, fever.  Pt had covid in Dec 2020

## 2019-12-24 NOTE — Discharge Instructions (Signed)
COVID testing ordered.  It will take between 2-5 days for test results.  Someone will contact you regarding abnormal results.    In the meantime: You should remain isolated in your home for 10 days from symptom onset AND greater than 72 hours after symptoms resolution (absence of fever without the use of fever-reducing medication and improvement in respiratory symptoms), whichever is longer Get plenty of rest and push fluids Tessalon Perles prescribed for cough Use OTC zyrtec for nasal congestion, runny nose, and/or sore throat Use OTC flonase for nasal congestion and runny nose Use medications daily for symptom relief Use OTC medications like ibuprofen or tylenol as needed fever or pain Call or go to the ED if you have any new or worsening symptoms such as fever, worsening cough, shortness of breath, chest tightness, chest pain, turning blue, changes in mental status, etc...  

## 2019-12-24 NOTE — ED Provider Notes (Signed)
[facility]   [id number] 12/24/19 Arrival Time: 1100   CC: COVID symptoms  SUBJECTIVE: History from: patient.  Christina Cameron is a 54 y.o. female who presents with cough, body aches, headache, and fever x 3 days.  Denies sick exposure to COVID, flu or strep.  Has tried OTC medications without relief.  Reports COVID infection in December 2020.  Denies sinus pain, sore throat, SOB, wheezing, chest pain, nausea, changes in bowel or bladder habits.     ROS: As per HPI.  All other pertinent ROS negative.     Past Medical History:  Diagnosis Date  . Acute bronchitis, unspecified   . Arthritis    right knee  . Cancer ([place])    cervical 1989  . Chest pain    a. normal cors by cath in 11/2014  . Chronic diastolic (congestive) heart failure ([place])   . Diabetes mellitus without complication ([place])   . Enlarged heart   . High cholesterol   . Hyperlipidemia, unspecified   . Hypertension   . Leaky heart valve   . Morbid (severe) obesity due to excess calories ([place])   . Osteoarthritis of knee, unspecified   . Palpitations   . Raynaud's syndrome   . Somnolence   . SVT (supraventricular tachycardia) (H[facility])    a. s/p ablation by Dr. Lovena Le in 2006.  . Tachycardia   . Type 2 diabetes mellitus with hyperglycemia ([place])   . Unspecified asthma, uncomplicated    Past Surgical History:  Procedure Laterality Date  . CARDIAC CATHETERIZATION    . CARDIAC CATHETERIZATION N/A 12/14/2014   Procedure: Right/Left Heart Cath and Coronary Angiography;  Surgeon: Sherren Mocha, MD;  Location: [facility];  Service: Cardiovascular;  Laterality: N/A;  . CARDIAC ELECTROPHYSIOLOGY STUDY AND ABLATION    . CESAREAN SECTION     x2  . ENDOMETRIAL ABLATION     No Known Allergies No current facility-administered medications on file prior to encounter.   Current Outpatient Medications on File Prior to Encounter  Medication Sig Dispense Refill  . albuterol (VENTOLIN HFA) 108 (90 Base) MCG/ACT  inhaler Inhale 1-2 puffs into the lungs every 6 (six) hours as needed for wheezing or shortness of breath. 18 g 0  . cetirizine (ZYRTEC ALLERGY) 10 MG tablet Take 1 tablet (10 mg total) by mouth daily. (Patient taking differently: Take 10 mg by mouth daily as needed. ) 30 tablet 0  . desvenlafaxine (PRISTIQ) 50 MG 24 hr tablet Take 1 tablet (50 mg total) by mouth daily. 30 tablet 2  . Dulaglutide (TRULICITY) 1.5 WU/9.8JX SOPN Inject 1.5 mg into the skin every Sunday.     . fluticasone (FLONASE) 50 MCG/ACT nasal spray Place 1-2 sprays into both nostrils as needed for allergies or rhinitis.    . furosemide (LASIX) 20 MG tablet Take 1 tablet (20 mg total) by mouth daily as needed for fluid. 30 tablet 3  . insulin glargine (LANTUS) 100 UNIT/ML injection Inject 30 Units into the skin 2 (two) times daily.    . irbesartan (AVAPRO) 150 MG tablet Take 1 tablet (150 mg total) by mouth daily. 30 tablet 11  . metFORMIN (GLUCOPHAGE) 500 MG tablet Take 500 mg by mouth 2 (two) times daily with a meal.    . metoprolol tartrate (LOPRESSOR) 50 MG tablet Take 1 tablet (50 mg total) by mouth 2 (two) times daily. 180 tablet 3  . ranolazine (RANEXA) 500 MG 12 hr tablet Take 500 mg by mouth 2 (two) times  daily.    . rosuvastatin (CRESTOR) 40 MG tablet Take 1 tablet (40 mg total) by mouth daily. 90 tablet 3   Social History   Socioeconomic History  . Marital status: Married    Spouse name: Not on file  . Number of children: Not on file  . Years of education: Not on file  . Highest education level: Not on file  Occupational History  . Not on file  Tobacco Use  . Smoking status: Never Smoker  . Smokeless tobacco: Never Used  Vaping Use  . Vaping Use: Never used  Substance and Sexual Activity  . Alcohol use: Yes    Comment: seldom  . Drug use: No  . Sexual activity: Yes    Birth control/protection: None  Other Topics Concern  . Not on file  Social History Narrative  . Not on file   Social Determinants of  Health   Financial Resource Strain:   . Difficulty of Paying Living Expenses:   Food Insecurity:   . Worried About Charity fundraiser in the Last Year:   . Arboriculturist in the Last Year:   Transportation Needs:   . Film/video editor (Medical):   Marland Kitchen Lack of Transportation (Non-Medical):   Physical Activity:   . Days of Exercise per Week:   . Minutes of Exercise per Session:   Stress:   . Feeling of Stress :   Social Connections:   . Frequency of Communication with Friends and Family:   . Frequency of Social Gatherings with Friends and Family:   . Attends Religious Services:   . Active Member of Clubs or Organizations:   . Attends Archivist Meetings:   Marland Kitchen Marital Status:   Intimate Partner Violence:   . Fear of Current or Ex-Partner:   . Emotionally Abused:   Marland Kitchen Physically Abused:   . Sexually Abused:    Family History  Problem Relation Age of Onset  . Cancer Mother   . Cancer Father   . Stroke Brother   . Hypertension Brother     OBJECTIVE:  Vitals:   12/24/19 1133 12/24/19 1135  BP:  (!) 118/62  Pulse:  88  Resp:  17  Temp:  99 F (37.2 C)  TempSrc:  Oral  SpO2:  95%  Weight: (!) 275 lb (124.7 kg)   Height: 5\' 4"  (1.626 m)      General appearance: alert; appears mildly fatigued, but nontoxic; speaking in full sentences and tolerating own secretions HEENT: NCAT; Ears: EACs clear, TMs pearly gray; Eyes: PERRL.  EOM grossly intact. Nose: nares patent without rhinorrhea, Throat: oropharynx clear, tonsils non erythematous or enlarged, uvula midline  Neck: supple without LAD Lungs: unlabored respirations, symmetrical air entry; cough: absent; no respiratory distress; CTAB Heart: regular rate and rhythm.   Skin: warm and dry Psychological: alert and cooperative; normal mood and affect  ASSESSMENT & PLAN:  1. Cough   2. Viral URI with cough   3. Suspected COVID-19 virus infection     Meds ordered this encounter  Medications  . benzonatate  (TESSALON) 100 MG capsule    Sig: Take 1 capsule (100 mg total) by mouth every 8 (eight) hours.    Dispense:  21 capsule    Refill:  0    Order Specific Question:   Supervising Provider    Answer:   Raylene Everts [[id number]]    COVID testing ordered.  It will take between 2-5 days for test results.  Someone will contact you regarding abnormal results.    In the meantime: You should remain isolated in your home for 10 days from symptom onset AND greater than 72 hours after symptoms resolution (absence of fever without the use of fever-reducing medication and improvement in respiratory symptoms), whichever is longer Get plenty of rest and push fluids Tessalon Perles prescribed for cough Use OTC zyrtec for nasal congestion, runny nose, and/or sore throat Use OTC flonase for nasal congestion and runny nose Use medications daily for symptom relief Use OTC medications like ibuprofen or tylenol as needed fever or pain Call or go to the ED if you have any new or worsening symptoms such as fever, worsening cough, shortness of breath, chest tightness, chest pain, turning blue, changes in mental status, etc...   Reviewed expectations re: course of current medical issues. Questions answered. Outlined signs and symptoms indicating need for more acute intervention. Patient verbalized understanding. After Visit Summary given.         Lestine Box, PA-C 12/24/19 1213

## 2019-12-25 ENCOUNTER — Telehealth: Payer: Medicaid Other | Admitting: Emergency Medicine

## 2019-12-25 DIAGNOSIS — R059 Cough, unspecified: Secondary | ICD-10-CM

## 2019-12-25 LAB — SARS-COV-2, NAA 2 DAY TAT

## 2019-12-25 LAB — NOVEL CORONAVIRUS, NAA: SARS-CoV-2, NAA: NOT DETECTED

## 2019-12-25 NOTE — Progress Notes (Signed)
Time spent 5 min  Based on what you shared with me, I feel your condition warrants further evaluation and I recommend that you be seen for a face to face office visit.  You reported several red flags including fever, sweats, chest tightness, cough. You also reported several treatments that have not helped including antibiotics, steroids.  For these reasons, I think you need a face to face re-evaluation to make sure you do not have pneumonia.    NOTE: If you entered your credit card information for this eVisit, you will not be charged. You may see a "hold" on your card for the $35 but that hold will drop off and you will not have a charge processed.   If you are having a true medical emergency please call 911.      For an urgent face to face visit, [facility] has five urgent care centers for your convenience:      NEW:  [facility] [place] [facility] [place] Get Driving Directions [phone] [place], [place] [address] . 10 am - 6pm Monday - Friday    [facility]) Get Driving Directions [phone] [address] [place], [place] [address] . 10 am to 8 pm Monday-Friday . 12 pm to 8 pm [facility] Get Driving Directions [phone] [place], [place], [place] [address] . 8 am to 8 pm Monday-Friday . 9 am to 6 pm Saturday . 11 am to 6 pm Sunday     [facility] at [facility] Get Driving Directions  [phone] [address], [place] [address] . 8 am to 8 pm Monday-Friday . 8 am to 4 pm [facility] at [place] Get Driving Directions [phone] [address] [place], [place] [address] . 12 pm to 6 pm Monday-Friday      Your e-visit answers were reviewed by a board certified advanced clinical practitioner to complete your personal care plan.  Thank you  for using e-Visits.

## 2019-12-29 ENCOUNTER — Ambulatory Visit (HOSPITAL_COMMUNITY)
Admission: RE | Admit: 2019-12-29 | Discharge: 2019-12-29 | Disposition: A | Discharge status: Home / Self Care | Payer: Medicaid Other | Source: Ambulatory Visit | Attending: Orthopedic Surgery | Admitting: Orthopedic Surgery

## 2019-12-29 ENCOUNTER — Other Ambulatory Visit: Payer: Self-pay

## 2019-12-29 DIAGNOSIS — M7989 Other specified soft tissue disorders: Secondary | ICD-10-CM | POA: Diagnosis present

## 2019-12-29 DIAGNOSIS — M79662 Pain in left lower leg: Secondary | ICD-10-CM | POA: Insufficient documentation

## 2020-01-17 ENCOUNTER — Telehealth: Payer: Medicaid Other | Admitting: Physician Assistant

## 2020-01-17 DIAGNOSIS — N760 Acute vaginitis: Secondary | ICD-10-CM | POA: Diagnosis not present

## 2020-01-17 MED ORDER — FLUCONAZOLE 150 MG PO TABS
150.0000 mg | ORAL_TABLET | Freq: Once | ORAL | 0 refills | Status: AC
Start: 2020-01-17 — End: 2020-01-17

## 2020-01-17 NOTE — Progress Notes (Signed)
Hi Christina Cameron,  I am sorry you are not feeling well.  I hope you are still planning to see your GYN on 8/24. I think you should be seen in person for follow-up as you have had several E-visits in the past few months.  However, I will be happy to treat your symptoms today until you can see your GYN.    Based on what you shared with me it looks like you: May have a yeast vaginosis  Vaginosis is an inflammation of the vagina that can result in discharge, itching and pain. The cause is usually a change in the normal balance of vaginal bacteria or an infection. Vaginosis can also result from reduced estrogen levels after menopause.  The most common causes of vaginosis are:   Bacterial vaginosis which results from an overgrowth of one on several organisms that are normally present in your vagina.   Yeast infections which are caused by a naturally occurring fungus called candida.   Vaginal atrophy (atrophic vaginosis) which results from the thinning of the vagina from reduced estrogen levels after menopause.   Trichomoniasis which is caused by a parasite and is commonly transmitted by sexual intercourse.  Factors that increase your risk of developing vaginosis include: Marland Kitchen Medications, such as antibiotics and steroids . Uncontrolled diabetes . Use of hygiene products such as bubble bath, vaginal spray or vaginal deodorant . Douching . Wearing damp or tight-fitting clothing . Using an intrauterine device (IUD) for birth control . Hormonal changes, such as those associated with pregnancy, birth control pills or menopause . Sexual activity . Having a sexually transmitted infection  Your treatment plan is A single Diflucan (fluconazole) 150mg  tablet once.  I have electronically sent this prescription into the pharmacy that you have chosen.  Be sure to take all of the medication as directed. Stop taking any medication if you develop a rash, tongue swelling or shortness of breath. Mothers who are breast  feeding should consider pumping and discarding their breast milk while on these antibiotics. However, there is no consensus that infant exposure at these doses would be harmful.  Remember that medication creams can weaken latex condoms. Marland Kitchen   HOME CARE:  Good hygiene may prevent some types of vaginosis from recurring and may relieve some symptoms:  . Avoid baths, hot tubs and whirlpool spas. Rinse soap from your outer genital area after a shower, and dry the area well to prevent irritation. Don't use scented or harsh soaps, such as those with deodorant or antibacterial action. Marland Kitchen Avoid irritants. These include scented tampons and pads. . Wipe from front to back after using the toilet. Doing so avoids spreading fecal bacteria to your vagina.  Other things that may help prevent vaginosis include:  Marland Kitchen Don't douche. Your vagina doesn't require cleansing other than normal bathing. Repetitive douching disrupts the normal organisms that reside in the vagina and can actually increase your risk of vaginal infection. Douching won't clear up a vaginal infection. . Use a latex condom. Both female and female latex condoms may help you avoid infections spread by sexual contact. . Wear cotton underwear. Also wear pantyhose with a cotton crotch. If you feel comfortable without it, skip wearing underwear to bed. Yeast thrives in [employer] Your symptoms should improve in the next day or two.  GET HELP RIGHT AWAY IF:  . You have pain in your lower abdomen ( pelvic area or over your ovaries) . You develop nausea or vomiting . You develop a fever . Your  discharge changes or worsens . You have persistent pain with intercourse . You develop shortness of breath, a rapid pulse, or you faint.  These symptoms could be signs of problems or infections that need to be evaluated by a medical provider now.  MAKE SURE YOU    Understand these instructions.  Will watch your condition.  Will get help right away  if you are not doing well or get worse.  Your e-visit answers were reviewed by a board certified advanced clinical practitioner to complete your personal care plan. Depending upon the condition, your plan could have included both over the counter or prescription medications. Please review your pharmacy choice to make sure that you have choses a pharmacy that is open for you to pick up any needed prescription, Your safety is important to [place]. If you have drug allergies check your prescription carefully.   You can use MyChart to ask questions about today's visit, request a non-urgent call back, or ask for a work or school excuse for 24 hours related to this e-Visit. If it has been greater than 24 hours you will need to follow up with your provider, or enter a new e-Visit to address those concerns. You will get a MyChart message within the next two days asking about your experience. I hope that your e-visit has been valuable and will speed your recovery.  Greater than 5 minutes, yet less than 10 minutes of time have been spent researching, coordinating and implementing care for this patient today.

## 2020-02-11 ENCOUNTER — Telehealth: Payer: Medicaid Other | Admitting: Cardiovascular Disease

## 2020-02-13 ENCOUNTER — Ambulatory Visit: Payer: Medicaid Other | Admitting: Physician Assistant

## 2020-02-17 ENCOUNTER — Ambulatory Visit: Payer: Medicaid Other | Admitting: Cardiology

## 2020-02-25 NOTE — Progress Notes (Signed)
Cardiology Office Note  Date: 02/26/2020   ID: Okla, Qazi 01-06-1966, MRN [id number]  PCP:  Bonnita Hollow, MD  Cardiologist:  No primary care provider on file. Electrophysiologist:  None   Chief Complaint: Palpitations  History of Present Illness: Christina Cameron is a 54 y.o. female with a history of palpitations, chest pain, HTN, HLD, bilateral leg edema, morbid obesity.  Last encounter with Dr. Bronson Ing via telemedicine 08/11/2019.  She was diagnosed with COVID-19 virus 05/29/2019.  Since then she had been experiencing chest tightness, shortness of breath, palpitations, dizziness and eyes hurting.  Also reported burning in her feet and swelling of her hands.  Blood pressures were staying elevated as well.  She tested positive again on 07/21/2019.  CXR 06/20/2019 showed slight bronchitic changes.  She was currently taking Ranexa 500 mg p.o. twice daily and felt like this helped her symptoms.  She was not taking Imdur.  Dr. Bronson Ing stated chest pain was noncardiac and current symptoms were likely part of post Covid syndrome.  Blood pressure was markedly elevated.  Lisinopril was increased to 10 mg daily.  Lipid labs were reviewed and were found to be markedly elevated.  Her rosuvastatin  had been recently increased to 40 mg daily.  She was continuing Lasix 20 mg as needed for bilateral leg edema.  Echo showed diastolic dysfunction may be playing a role.  Needed aggressive weight loss.  She was having more palpitations.  She had undergone a previous SVT ablation in 2005.  An event monitor was ordered.  Lopressor was increased to 25 mg p.o. twice daily.  Cardiac monitor 09/23/2019 showed sinus rhythm and sinus tachycardia with rare PACs and very brief atrial run.  No sustained arrhythmias.  She is here for 29-month follow-up.  She states she is having some more issues with palpitations.  He states the palpitations seems to have gotten worse since she had the Covid virus.  States she  sometimes wakes up with palpitations even with increased doses of metoprolol.  Continues to complain of shortness of breath and chest pressure with ambulation and increased activity.  She states her diabetes has not been well controlled running in the high 280s to 300 range.  She has established with a new physician Dr. Grandville Silos at [facility].  Her blood pressure is well controlled on current therapy.  She admits to snoring-like symptoms and think she likely does have sleep apnea.  She admits she is significantly overweight and needs to lose weight.  Also complaining of lower extremity calf pain when ambulating relieved at rest.  She was pending knee replacement surgery but states she was told she needed better control of her diabetes and weight loss before she could have the surgery.  Past Medical History:  Diagnosis Date  . Acute bronchitis, unspecified   . Arthritis    right knee  . Cancer ([place])    cervical 1989  . Chest pain    a. normal cors by cath in 11/2014  . Chronic diastolic (congestive) heart failure ([place])   . Diabetes mellitus without complication ([place])   . Enlarged heart   . High cholesterol   . Hyperlipidemia, unspecified   . Hypertension   . Leaky heart valve   . Morbid (severe) obesity due to excess calories ([place])   . Osteoarthritis of knee, unspecified   . Palpitations   . Raynaud's syndrome   . Somnolence   . SVT (supraventricular tachycardia) ([facility])    a. s/p  ablation by Dr. Lovena Le in 2006.  . Tachycardia   . Type 2 diabetes mellitus with hyperglycemia ([place])   . Unspecified asthma, uncomplicated     Past Surgical History:  Procedure Laterality Date  . CARDIAC CATHETERIZATION    . CARDIAC CATHETERIZATION N/A 12/14/2014   Procedure: Right/Left Heart Cath and Coronary Angiography;  Surgeon: Sherren Mocha, MD;  Location: [facility];  Service: Cardiovascular;  Laterality: N/A;  . CARDIAC ELECTROPHYSIOLOGY STUDY AND ABLATION    . CESAREAN SECTION      x2  . ENDOMETRIAL ABLATION      Current Outpatient Medications  Medication Sig Dispense Refill  . albuterol (VENTOLIN HFA) 108 (90 Base) MCG/ACT inhaler Inhale 1-2 puffs into the lungs every 6 (six) hours as needed for wheezing or shortness of breath. 18 g 0  . benzonatate (TESSALON) 100 MG capsule Take 1 capsule (100 mg total) by mouth every 8 (eight) hours. 21 capsule 0  . cetirizine (ZYRTEC ALLERGY) 10 MG tablet Take 1 tablet (10 mg total) by mouth daily. (Patient taking differently: Take 10 mg by mouth daily as needed. ) 30 tablet 0  . desvenlafaxine (PRISTIQ) 50 MG 24 hr tablet Take 1 tablet (50 mg total) by mouth daily. 30 tablet 2  . Dulaglutide (TRULICITY) 1.5 GH/8.2XH SOPN Inject 1.5 mg into the skin every Sunday.     . furosemide (LASIX) 20 MG tablet Take 1 tablet (20 mg total) by mouth daily as needed for fluid. 30 tablet 3  . ibuprofen (ADVIL) 800 MG tablet Take 800 mg by mouth every 8 (eight) hours as needed.    . insulin glargine (LANTUS) 100 UNIT/ML injection Inject 30 Units into the skin 2 (two) times daily.    . irbesartan (AVAPRO) 150 MG tablet Take 1 tablet (150 mg total) by mouth daily. 30 tablet 11  . meloxicam (MOBIC) 15 MG tablet Take 15 mg by mouth daily.    . metFORMIN (GLUCOPHAGE) 500 MG tablet Take 500 mg by mouth 2 (two) times daily with a meal.    . metoprolol tartrate (LOPRESSOR) 50 MG tablet Take 1 tablet (50 mg total) by mouth 2 (two) times daily. 180 tablet 3  . ranolazine (RANEXA) 500 MG 12 hr tablet Take 500 mg by mouth 2 (two) times daily.    . rosuvastatin (CRESTOR) 40 MG tablet Take 1 tablet (40 mg total) by mouth daily. 90 tablet 3   No current facility-administered medications for this visit.   Allergies:  Patient has no known allergies.   Social History: The patient  reports that she has never smoked. She has never used smokeless tobacco. She reports current alcohol use. She reports that she does not use drugs.   Family History: The patient's family  history includes Cancer in her father and mother; Hypertension in her brother; Stroke in her brother.   ROS:  Please see the history of present illness. Otherwise, complete review of systems is positive for none.  All other systems are reviewed and negative.   Physical Exam: VS:  BP 124/80   Pulse 74   Ht 5\' 4"  (1.626 m)   Wt 277 lb 3.2 oz (125.7 kg)   SpO2 98%   BMI 47.58 kg/m , BMI Body mass index is 47.58 kg/m.  Wt Readings from Last 3 Encounters:  02/26/20 277 lb 3.2 oz (125.7 kg)  12/24/19 (!) 275 lb (124.7 kg)  11/28/19 271 lb (122.9 kg)    General: Severely morbidly obese patient appears comfortable at  rest. Neck: Supple, no elevated JVP or carotid bruits, no thyromegaly. Lungs: Clear to auscultation, nonlabored breathing at rest. Cardiac: Regular rate and rhythm, no S3 or significant systolic murmur, no pericardial rub. Extremities: No pitting edema, distal pulses 2+. Skin: Warm and dry. Musculoskeletal: No kyphosis. Neuropsychiatric: Alert and oriented x3, affect grossly appropriate.  ECG:  EKG on May 29, 2019 sinus tachycardia rate of 108, right superior axis deviation, cannot rule out anteroseptal infarct, age undetermined  Recent Labwork: 06/01/2019: ALT 50; AST 42; BUN 17; Creatinine, Ser 0.52; Hemoglobin 14.9; Platelets 228; Potassium 4.2; Sodium 136     Component Value Date/Time   CHOL 229 (H) 08/07/2019 0814   TRIG 163 (H) 08/07/2019 0814   HDL 47 (L) 08/07/2019 0814   CHOLHDL 4.9 08/07/2019 0814   LDLCALC 152 (H) 08/07/2019 0814    Other Studies Reviewed Today:  Lower venous study 12/29/2019 left leg IMPRESSION: No evidence of deep venous thrombosis   Cardiac monitor 09/23/2019 Study Highlights Sinus rhythm and sinus tachycardia with rare PACs and a very brief atrial run. There were no sustained arrhythmias.    Cardiac catheterization on 12/14/2014 demonstrated angiographically normal coronary arteries and normal intracardiac  pressures.   Echocardiogram 07/21/2019:  1. Left ventricular ejection fraction, by estimation, is 60 to 65%. The  left ventricle has normal function. The left ventricle has no regional  wall motion abnormalities. There is moderate concentric left ventricular  hypertrophy. Left ventricular  diastolic parameters are consistent with Grade I diastolic dysfunction  (impaired relaxation).  2. Right ventricular systolic function is normal. The right ventricular  size is mildly enlarged.  3. Left atrial size was mildly dilated.  4. The mitral valve is grossly normal. No evidence of mitral valve  regurgitation.  5. The aortic valve is grossly normal. Aortic valve regurgitation is not  visualized.  6. The inferior vena cava is normal in size with greater than 50%  respiratory variability, suggesting right atrial pressure of 3 mmHg.  Assessment and Plan:  1. Preoperative clearance   2. Palpitations   3. Chest pain, unspecified type   4. Essential hypertension, benign   5. Bilateral leg edema   6. Morbid obesity, unspecified obesity type ([place])     1. Preoperative clearance Patient has pending knee surgery by Dr. French Ana but has been told by primary care provider that she must have better control over her diabetes before surgery can proceed.  Also states she has been told she needs to lose approximately 30 pounds before she can undergo the surgery.  She is having some chest pain with exertional dyspnea.  We are ordering a Lexiscan stress test.  At this time patient cannot be cleared for surgery.  2. Palpitations Continues with palpitations in spite of increasing beta-blocker therapy.  She continues on metoprolol 50 mg p.o. twice daily.  Continues to have breakthrough palpitations.  Start Cardizem 30 mg p.o. twice daily for palpitations.  3. Chest pain, unspecified type Complaining of increasing chest pain with exertional dyspnea.  She is on ranolazine 500 mg p.o. twice daily.  Not on  Imdur.  Please get a Lexiscan stress test to reassess for ischemia.  Last ischemic evaluation with cardiac cath in 2016 showed normal coronary arteries.  She has multiple risk factors for cardiac disease including uncontrolled diabetes, hypertension, morbid obesity, significant hyperlipidemia.   4. Essential hypertension, benign Blood pressure is well controlled on current therapy.  Continue irbesartan 150 mg daily, metoprolol 50 mg p.o. twice daily.   5.  Bilateral leg edema No significant bilateral leg edema noted.  Continue Lasix 20 mg as needed for fluid.  6. Morbid obesity, unspecified obesity type ([place]) Significant morbid obesity with a BMI of 47.58.  Patient needs aggressive weight loss.  Patient states she has been told by surgeon she needs to lose approximately 30 pounds before she can proceed with knee surgery  7.  Bilateral leg pain Complaining of bilateral calf pain when walking and relieved with rest.  Please get lower extremity arterial Doppler with ABIs.  8.  Suspected sleep apnea Patient states she has a significant history of snoring with significant fatigue.  Suspect sleep apnea given her weight and snoring as well as significant obesity.  Please refer for sleep study  Medication Adjustments/Labs and Tests Ordered: Current medicines are reviewed at length with the patient today.  Concerns regarding medicines are outlined above.   Disposition: Follow-up with Dr. Harl Bowie.  Patient wants to establish with him follow-up in 4 to 6 weeks Signed, [name], NP 02/26/2020 9:16 AM    [facility], [place], [place] [address] Phone: [phone]; Fax: [phone]

## 2020-02-26 ENCOUNTER — Ambulatory Visit (INDEPENDENT_AMBULATORY_CARE_PROVIDER_SITE_OTHER): Payer: Medicaid Other | Admitting: Family Medicine

## 2020-02-26 ENCOUNTER — Encounter: Payer: Self-pay | Admitting: Family Medicine

## 2020-02-26 ENCOUNTER — Other Ambulatory Visit: Payer: Self-pay | Admitting: *Deleted

## 2020-02-26 ENCOUNTER — Encounter: Payer: Self-pay | Admitting: *Deleted

## 2020-02-26 VITALS — BP 124/80 | HR 74 | Ht 64.0 in | Wt 277.2 lb

## 2020-02-26 DIAGNOSIS — Z01818 Encounter for other preprocedural examination: Secondary | ICD-10-CM

## 2020-02-26 DIAGNOSIS — I10 Essential (primary) hypertension: Secondary | ICD-10-CM | POA: Diagnosis not present

## 2020-02-26 DIAGNOSIS — M79605 Pain in left leg: Secondary | ICD-10-CM

## 2020-02-26 DIAGNOSIS — R079 Chest pain, unspecified: Secondary | ICD-10-CM | POA: Diagnosis not present

## 2020-02-26 DIAGNOSIS — R600 Localized edema: Secondary | ICD-10-CM

## 2020-02-26 DIAGNOSIS — R002 Palpitations: Secondary | ICD-10-CM

## 2020-02-26 DIAGNOSIS — M79604 Pain in right leg: Secondary | ICD-10-CM

## 2020-02-26 DIAGNOSIS — R29818 Other symptoms and signs involving the nervous system: Secondary | ICD-10-CM

## 2020-02-26 MED ORDER — DILTIAZEM HCL 30 MG PO TABS
30.0000 mg | ORAL_TABLET | Freq: Two times a day (BID) | ORAL | 2 refills | Status: DC | PRN
Start: 2020-02-26 — End: 2020-05-19

## 2020-02-26 MED ORDER — DILTIAZEM HCL 30 MG PO TABS: 30.0000 mg | ORAL_TABLET | Freq: Two times a day (BID) | ORAL | 2 refills | Status: DC | PRN

## 2020-02-26 NOTE — Patient Instructions (Addendum)
Medication Instructions:   Your physician has recommended you make the following change in your medication:   Start diltiazem 30 mg by mouth twice daily as needed  Continue other medications the same  Labwork:  None  Testing/Procedures: Your physician has requested that you have a lexiscan myoview. For further information please visit [email or web address]. Please follow instruction sheet, as given.  Your physician has requested that you have a lower extremity arterial exercise duplex. During this test, exercise and ultrasound are used to evaluate arterial blood flow in the legs. Allow one hour for this exam. There are no restrictions or special instructions. Your physician has requested that you have an ankle brachial index (ABI). During this test an ultrasound and blood pressure cuff are used to evaluate the arteries that supply the arms and legs with blood. Allow thirty minutes for this exam. There are no restrictions or special instructions.  Follow-Up:  Your physician recommends that you schedule a follow-up appointment in: 4-6 weeks.  Any Other Special Instructions Will Be Listed Below (If Applicable).  You have been referred to [facility].  If you need a refill on your cardiac medications before your next appointment, please call your pharmacy.

## 2020-03-02 ENCOUNTER — Encounter (HOSPITAL_COMMUNITY): Payer: Self-pay

## 2020-03-02 ENCOUNTER — Ambulatory Visit (HOSPITAL_COMMUNITY)
Admission: RE | Admit: 2020-03-02 | Discharge: 2020-03-02 | Disposition: A | Discharge status: Home / Self Care | Payer: Medicaid Other | Source: Ambulatory Visit | Attending: Family Medicine | Admitting: Family Medicine

## 2020-03-02 ENCOUNTER — Encounter (HOSPITAL_BASED_OUTPATIENT_CLINIC_OR_DEPARTMENT_OTHER)
Admission: RE | Admit: 2020-03-02 | Discharge: 2020-03-02 | Disposition: A | Discharge status: Home / Self Care | Payer: Medicaid Other | Source: Ambulatory Visit | Attending: Family Medicine | Admitting: Family Medicine

## 2020-03-02 ENCOUNTER — Other Ambulatory Visit: Payer: Self-pay

## 2020-03-02 DIAGNOSIS — R079 Chest pain, unspecified: Secondary | ICD-10-CM | POA: Insufficient documentation

## 2020-03-02 DIAGNOSIS — Z9289 Personal history of other medical treatment: Secondary | ICD-10-CM

## 2020-03-02 HISTORY — DX: Personal history of other medical treatment: Z92.89

## 2020-03-02 LAB — NM MYOCAR MULTI W/SPECT W/WALL MOTION / EF
LV dias vol: 100 mL (ref 46–106)
LV sys vol: 34 mL
Peak HR: 106 {beats}/min
RATE: 0.4
Rest HR: 87 {beats}/min
SDS: 6
SRS: 2
SSS: 8
TID: 1.2

## 2020-03-02 MED ORDER — SODIUM CHLORIDE FLUSH 0.9 % IV SOLN
INTRAVENOUS | Status: AC
  Administered 2020-03-02: 10 mL via INTRAVENOUS
  Filled 2020-03-02: qty 10

## 2020-03-02 MED ORDER — TECHNETIUM TC 99M TETROFOSMIN IV KIT
10.0000 | PACK | Freq: Once | INTRAVENOUS | Status: AC | PRN
  Administered 2020-03-02: 11 via INTRAVENOUS

## 2020-03-02 MED ORDER — TECHNETIUM TC 99M TETROFOSMIN IV KIT
30.0000 | PACK | Freq: Once | INTRAVENOUS | Status: AC | PRN
  Administered 2020-03-02: 30 via INTRAVENOUS

## 2020-03-02 MED ORDER — REGADENOSON 0.4 MG/5ML IV SOLN
INTRAVENOUS | Status: AC
  Administered 2020-03-02: 0.4 mg via INTRAVENOUS
  Filled 2020-03-02: qty 5

## 2020-03-04 ENCOUNTER — Telehealth: Payer: Self-pay | Admitting: *Deleted

## 2020-03-04 ENCOUNTER — Other Ambulatory Visit: Payer: Self-pay | Admitting: Family Medicine

## 2020-03-04 DIAGNOSIS — I739 Peripheral vascular disease, unspecified: Secondary | ICD-10-CM

## 2020-03-04 NOTE — Telephone Encounter (Signed)
Laurine Blazer, LPN  99/11/7412 2:39 PM EDT Back to Top    Notified, copy to pcp.

## 2020-03-04 NOTE — Telephone Encounter (Signed)
-----   Message from Verta Ellen., NP sent at 03/03/2020  7:25 PM EDT ----- Please let the patient know the stress test was negative for any evidence of lack of blood flow through the coronary arteries. It was considered a low risk for coronary artery disease. Pumping function was good. Thanks

## 2020-03-25 NOTE — Progress Notes (Deleted)
Cardiology Office Note  Date: 03/25/2020   ID: Christina, Cameron 1966/03/19, MRN [id number]  PCP:  Bonnita Hollow, MD  Cardiologist:  No primary care provider on file. Electrophysiologist:  None   Chief Complaint: Palpitations  History of Present Illness: Christina Cameron is a 54 y.o. female with a history of palpitations, chest pain, HTN, HLD, bilateral leg edema, morbid obesity.  Last encounter with Dr. Bronson Ing via telemedicine 08/11/2019.  She was diagnosed with COVID-19 virus 05/29/2019.  Since then she had been experiencing chest tightness, shortness of breath, palpitations, dizziness and eyes hurting.  Also reported burning in her feet and swelling of her hands.  Blood pressures were staying elevated as well.  She tested positive again on 07/21/2019.  CXR 06/20/2019 showed slight bronchitic changes.  She was currently taking Ranexa 500 mg p.o. twice daily and felt like this helped her symptoms.  She was not taking Imdur.  Dr. Bronson Ing stated chest pain was noncardiac and current symptoms were likely part of post Covid syndrome.  Blood pressure was markedly elevated.  Lisinopril was increased to 10 mg daily.  Lipid labs were reviewed and were found to be markedly elevated.  Her rosuvastatin  had been recently increased to 40 mg daily.  She was continuing Lasix 20 mg as needed for bilateral leg edema.  Echo showed diastolic dysfunction may be playing a role.  Needed aggressive weight loss.  She was having more palpitations.  She had undergone a previous SVT ablation in 2005.  An event monitor was ordered.  Lopressor was increased to 25 mg p.o. twice daily.  Cardiac monitor 09/23/2019 showed sinus rhythm and sinus tachycardia with rare PACs and very brief atrial run.  No sustained arrhythmias.  She is here for 35-month follow-up.  She states she is having some more issues with palpitations.  He states the palpitations seems to have gotten worse since she had the Covid virus.  States she  sometimes wakes up with palpitations even with increased doses of metoprolol.  Continues to complain of shortness of breath and chest pressure with ambulation and increased activity.  She states her diabetes has not been well controlled running in the high 280s to 300 range.  She has established with a new physician Dr. Grandville Silos at [place].  Her blood pressure is well controlled on current therapy.  She admits to snoring-like symptoms and think she likely does have sleep apnea.  She admits she is significantly overweight and needs to lose weight.  Also complaining of lower extremity calf pain when ambulating relieved at rest.  She was pending knee replacement surgery but states she was told she needed better control of her diabetes and weight loss before she could have the surgery.  Past Medical History:  Diagnosis Date  . Acute bronchitis, unspecified   . Arthritis    right knee  . Cancer ([place])    cervical 1989  . Chest pain    a. normal cors by cath in 11/2014  . Chronic diastolic (congestive) heart failure ([place])   . Diabetes mellitus without complication ([place])   . Enlarged heart   . High cholesterol   . Hyperlipidemia, unspecified   . Hypertension   . Leaky heart valve   . Morbid (severe) obesity due to excess calories ([place])   . Osteoarthritis of knee, unspecified   . Palpitations   . Raynaud's syndrome   . Somnolence   . SVT (supraventricular tachycardia) (H[facility])    a. s/p  ablation by Dr. Lovena Le in 2006.  . Tachycardia   . Type 2 diabetes mellitus with hyperglycemia ([place])   . Unspecified asthma, uncomplicated     Past Surgical History:  Procedure Laterality Date  . CARDIAC CATHETERIZATION    . CARDIAC CATHETERIZATION N/A 12/14/2014   Procedure: Right/Left Heart Cath and Coronary Angiography;  Surgeon: Sherren Mocha, MD;  Location: [facility];  Service: Cardiovascular;  Laterality: N/A;  . CARDIAC ELECTROPHYSIOLOGY STUDY AND ABLATION    . CESAREAN SECTION      x2  . ENDOMETRIAL ABLATION      Current Outpatient Medications  Medication Sig Dispense Refill  . albuterol (VENTOLIN HFA) 108 (90 Base) MCG/ACT inhaler Inhale 1-2 puffs into the lungs every 6 (six) hours as needed for wheezing or shortness of breath. 18 g 0  . benzonatate (TESSALON) 100 MG capsule Take 1 capsule (100 mg total) by mouth every 8 (eight) hours. 21 capsule 0  . cetirizine (ZYRTEC ALLERGY) 10 MG tablet Take 1 tablet (10 mg total) by mouth daily. (Patient taking differently: Take 10 mg by mouth daily as needed. ) 30 tablet 0  . desvenlafaxine (PRISTIQ) 50 MG 24 hr tablet Take 1 tablet (50 mg total) by mouth daily. 30 tablet 2  . diltiazem (CARDIZEM) 30 MG tablet Take 1 tablet (30 mg total) by mouth 2 (two) times daily as needed (palpitations). 60 tablet 2  . Dulaglutide (TRULICITY) 1.5 TD/3.2KG SOPN Inject 1.5 mg into the skin every Sunday.     . furosemide (LASIX) 20 MG tablet Take 1 tablet (20 mg total) by mouth daily as needed for fluid. 30 tablet 3  . ibuprofen (ADVIL) 800 MG tablet Take 800 mg by mouth every 8 (eight) hours as needed.    . insulin glargine (LANTUS) 100 UNIT/ML injection Inject 30 Units into the skin 2 (two) times daily.    . irbesartan (AVAPRO) 150 MG tablet Take 1 tablet (150 mg total) by mouth daily. 30 tablet 11  . meloxicam (MOBIC) 15 MG tablet Take 15 mg by mouth daily.    . metFORMIN (GLUCOPHAGE) 500 MG tablet Take 500 mg by mouth 2 (two) times daily with a meal.    . metoprolol tartrate (LOPRESSOR) 50 MG tablet Take 1 tablet (50 mg total) by mouth 2 (two) times daily. 180 tablet 3  . ranolazine (RANEXA) 500 MG 12 hr tablet Take 500 mg by mouth 2 (two) times daily.    . rosuvastatin (CRESTOR) 40 MG tablet Take 1 tablet (40 mg total) by mouth daily. 90 tablet 3   No current facility-administered medications for this visit.   Allergies:  Patient has no known allergies.   Social History: The patient  reports that she has never smoked. She has never used  smokeless tobacco. She reports current alcohol use. She reports that she does not use drugs.   Family History: The patient's family history includes Cancer in her father and mother; Hypertension in her brother; Stroke in her brother.   ROS:  Please see the history of present illness. Otherwise, complete review of systems is positive for none.  All other systems are reviewed and negative.   Physical Exam: VS:  There were no vitals taken for this visit., BMI There is no height or weight on file to calculate BMI.  Wt Readings from Last 3 Encounters:  02/26/20 277 lb 3.2 oz (125.7 kg)  12/24/19 (!) 275 lb (124.7 kg)  11/28/19 271 lb (122.9 kg)    General: Severely  morbidly obese patient appears comfortable at rest. Neck: Supple, no elevated JVP or carotid bruits, no thyromegaly. Lungs: Clear to auscultation, nonlabored breathing at rest. Cardiac: Regular rate and rhythm, no S3 or significant systolic murmur, no pericardial rub. Extremities: No pitting edema, distal pulses 2+. Skin: Warm and dry. Musculoskeletal: No kyphosis. Neuropsychiatric: Alert and oriented x3, affect grossly appropriate.  ECG:  EKG on May 29, 2019 sinus tachycardia rate of 108, right superior axis deviation, cannot rule out anteroseptal infarct, age undetermined  Recent Labwork: 06/01/2019: ALT 50; AST 42; BUN 17; Creatinine, Ser 0.52; Hemoglobin 14.9; Platelets 228; Potassium 4.2; Sodium 136     Component Value Date/Time   CHOL 229 (H) 08/07/2019 0814   TRIG 163 (H) 08/07/2019 0814   HDL 47 (L) 08/07/2019 0814   CHOLHDL 4.9 08/07/2019 0814   LDLCALC 152 (H) 08/07/2019 0814    Other Studies Reviewed Today:   NST 03/02/2020 Study Result  Narrative & Impression   There was no ST segment deviation noted during stress.  The study is normal. There are no perfusion defects consistent with prior infarct or current ischemia.  This is a low risk study.  The left ventricular ejection fraction is normal  (55-65%).      Lower venous study 12/29/2019 left leg IMPRESSION: No evidence of deep venous thrombosis   Cardiac monitor 09/23/2019 Study Highlights Sinus rhythm and sinus tachycardia with rare PACs and a very brief atrial run. There were no sustained arrhythmias.    Cardiac catheterization on 12/14/2014 demonstrated angiographically normal coronary arteries and normal intracardiac pressures.   Echocardiogram 07/21/2019:  1. Left ventricular ejection fraction, by estimation, is 60 to 65%. The  left ventricle has normal function. The left ventricle has no regional  wall motion abnormalities. There is moderate concentric left ventricular  hypertrophy. Left ventricular  diastolic parameters are consistent with Grade I diastolic dysfunction  (impaired relaxation).  2. Right ventricular systolic function is normal. The right ventricular  size is mildly enlarged.  3. Left atrial size was mildly dilated.  4. The mitral valve is grossly normal. No evidence of mitral valve  regurgitation.  5. The aortic valve is grossly normal. Aortic valve regurgitation is not  visualized.  6. The inferior vena cava is normal in size with greater than 50%  respiratory variability, suggesting right atrial pressure of 3 mmHg.  Assessment and Plan:  1. Preoperative clearance Patient has pending knee surgery by Dr. French Ana but has been told by primary care provider that she must have better control over her diabetes before surgery can proceed.  Also states she has been told she needs to lose approximately 30 pounds before she can undergo the surgery.  She is having some chest pain with exertional dyspnea.  Recent Lexiscan stress test 03/02/2020 was low risk.  2. Palpitations Continues with palpitations in spite of increasing beta-blocker therapy.  She continues on metoprolol 50 mg p.o. twice daily.  Continues to have breakthrough palpitations.  Started Cardizem 30 mg p.o. twice daily for  palpitations at last visit  3. Chest pain, unspecified type At last visit complained of increasing chest pain with exertional dyspnea.  Was taking ranolazine 500 mg p.o. twice daily.  Not on Imdur. Last ischemic evaluation with cardiac cath in 2016 showed normal coronary arteries.  She has multiple risk factors for cardiac disease including uncontrolled diabetes, hypertension, morbid obesity, significant hyperlipidemia.  Recent Lexiscan stress test was low risk on 03/02/2020  4. Essential hypertension, benign Blood pressure is well controlled  on current therapy.  Continue irbesartan 150 mg daily, metoprolol 50 mg p.o. twice daily.   5. Bilateral leg edema No significant bilateral leg edema noted.  Continue Lasix 20 mg as needed for fluid.  6. Morbid obesity, unspecified obesity type ([place]) Significant morbid obesity with a BMI of 47.58.  Patient needs aggressive weight loss.  Patient states she has been told by surgeon she needs to lose approximately 30 pounds before she can proceed with knee surgery  7.  Bilateral leg pain Complaining of bilateral calf pain when walking and relieved with rest.  Please get lower extremity arterial Doppler with ABIs.  8.  Suspected sleep apnea Patient states she has a significant history of snoring with significant fatigue.  Suspect sleep apnea given her weight and snoring as well as significant obesity.  She was referred for sleep study at last visit.  Medication Adjustments/Labs and Tests Ordered: Current medicines are reviewed at length with the patient today.  Concerns regarding medicines are outlined above.   Disposition: Follow-up with Dr. Harl Bowie.  Patient wants to establish with him.     Signed, [name], NP 03/25/2020 3:52 PM    [facility] at [place], [place],  [address] Phone: [phone]; Fax: [phone]

## 2020-03-26 ENCOUNTER — Ambulatory Visit: Payer: Medicaid Other | Admitting: Family Medicine

## 2020-04-05 ENCOUNTER — Institutional Professional Consult (permissible substitution): Payer: Medicaid Other | Admitting: Pulmonary Disease

## 2020-04-10 ENCOUNTER — Telehealth: Payer: Medicaid Other | Admitting: Orthopedic Surgery

## 2020-04-10 DIAGNOSIS — R399 Unspecified symptoms and signs involving the genitourinary system: Secondary | ICD-10-CM | POA: Diagnosis not present

## 2020-04-10 MED ORDER — NITROFURANTOIN MONOHYD MACRO 100 MG PO CAPS: 100.0000 mg | ORAL_CAPSULE | Freq: Two times a day (BID) | ORAL | 0 refills | Status: AC

## 2020-04-10 NOTE — Progress Notes (Signed)

## 2020-04-20 ENCOUNTER — Other Ambulatory Visit: Payer: Self-pay

## 2020-04-20 ENCOUNTER — Ambulatory Visit: Payer: Medicaid Other | Admitting: Family Medicine

## 2020-04-20 ENCOUNTER — Ambulatory Visit (INDEPENDENT_AMBULATORY_CARE_PROVIDER_SITE_OTHER): Payer: Medicaid Other

## 2020-04-20 DIAGNOSIS — I739 Peripheral vascular disease, unspecified: Secondary | ICD-10-CM

## 2020-04-27 ENCOUNTER — Telehealth: Payer: Self-pay | Admitting: *Deleted

## 2020-04-27 ENCOUNTER — Ambulatory Visit: Payer: Medicaid Other | Admitting: Family Medicine

## 2020-04-27 NOTE — Telephone Encounter (Signed)
Laurine Blazer, LPN  99/35/7017 79:39 PM EST Back to Top    Notified, copy to pcp.

## 2020-04-27 NOTE — Telephone Encounter (Signed)
-----   Message from Verta Ellen., NP sent at 04/24/2020  8:34 PM EST ----- Please call the patient and let her know the low extremity arterial study showed no evidence of arterial disease in her legs. She has good blood flow through her arterial system in her legs. Thank You

## 2020-05-02 ENCOUNTER — Other Ambulatory Visit: Payer: Self-pay

## 2020-05-02 ENCOUNTER — Ambulatory Visit
Admission: RE | Admit: 2020-05-02 | Discharge: 2020-05-02 | Disposition: A | Discharge status: Home / Self Care | Payer: Medicaid Other | Source: Ambulatory Visit | Attending: Emergency Medicine | Admitting: Emergency Medicine

## 2020-05-02 VITALS — BP 133/83 | HR 83 | Temp 98.7°F | Resp 19

## 2020-05-02 DIAGNOSIS — Z1152 Encounter for screening for COVID-19: Secondary | ICD-10-CM | POA: Diagnosis not present

## 2020-05-02 DIAGNOSIS — J069 Acute upper respiratory infection, unspecified: Secondary | ICD-10-CM

## 2020-05-02 MED ORDER — ALBUTEROL SULFATE HFA 108 (90 BASE) MCG/ACT IN AERS: 1.0000 | INHALATION_SPRAY | Freq: Four times a day (QID) | RESPIRATORY_TRACT | 0 refills | Status: DC | PRN

## 2020-05-02 MED ORDER — BENZONATATE 100 MG PO CAPS: 100.0000 mg | ORAL_CAPSULE | Freq: Three times a day (TID) | ORAL | 0 refills | Status: DC | PRN

## 2020-05-02 MED ORDER — AZITHROMYCIN 250 MG PO TABS: 250.0000 mg | ORAL_TABLET | Freq: Every day | ORAL | 0 refills | Status: DC

## 2020-05-02 NOTE — ED Triage Notes (Signed)
Pt presents with complaints of painful cough, sore throat, weakness, and decreased taste x 2 days. Pt had covid a year ago.

## 2020-05-02 NOTE — ED Provider Notes (Signed)
[facility]   [id number] 05/02/20 Arrival Time: 50   CC: COVID symptoms  SUBJECTIVE: History from: patient and family.  Christina Cameron is a 54 y.o. female w who presented to the urgent care for complaint of cough, sore throat and weakness and loss of taste for the past 2 days.  Reported dark green sputum.  Denies sick exposure to COVID, flu or strep.  Denies recent travel.  Has tried OTC medication without relief.  Denies aggravating factors.  Denies previous symptoms in the past.   Denies fever, chills, fatigue, sinus pain, rhinorrhea, sore throat, SOB, wheezing, chest pain, nausea, changes in bowel or bladder habits.     ROS: As per HPI.  All other pertinent ROS negative.      Past Medical History:  Diagnosis Date  . Acute bronchitis, unspecified   . Arthritis    right knee  . Cancer ([place])    cervical 1989  . Chest pain    a. normal cors by cath in 11/2014  . Chronic diastolic (congestive) heart failure ([place])   . Diabetes mellitus without complication ([place])   . Enlarged heart   . High cholesterol   . Hyperlipidemia, unspecified   . Hypertension   . Leaky heart valve   . Morbid (severe) obesity due to excess calories ([place])   . Osteoarthritis of knee, unspecified   . Palpitations   . Raynaud's syndrome   . Somnolence   . SVT (supraventricular tachycardia) ([facility])    a. s/p ablation by Dr. Lovena Le in 2006.  . Tachycardia   . Type 2 diabetes mellitus with hyperglycemia ([name])   . Unspecified asthma, uncomplicated    Past Surgical History:  Procedure Laterality Date  . CARDIAC CATHETERIZATION    . CARDIAC CATHETERIZATION N/A 12/14/2014   Procedure: Right/Left Heart Cath and Coronary Angiography;  Surgeon: Sherren Mocha, MD;  Location: [facility];  Service: Cardiovascular;  Laterality: N/A;  . CARDIAC ELECTROPHYSIOLOGY STUDY AND ABLATION    . CESAREAN SECTION     x2  . ENDOMETRIAL ABLATION     No Known Allergies No current facility-administered  medications on file prior to encounter.   Current Outpatient Medications on File Prior to Encounter  Medication Sig Dispense Refill  . cetirizine (ZYRTEC ALLERGY) 10 MG tablet Take 1 tablet (10 mg total) by mouth daily. (Patient taking differently: Take 10 mg by mouth daily as needed. ) 30 tablet 0  . desvenlafaxine (PRISTIQ) 50 MG 24 hr tablet Take 1 tablet (50 mg total) by mouth daily. 30 tablet 2  . diltiazem (CARDIZEM) 30 MG tablet Take 1 tablet (30 mg total) by mouth 2 (two) times daily as needed (palpitations). 60 tablet 2  . Dulaglutide (TRULICITY) 1.5 JJ/0.0XF SOPN Inject 1.5 mg into the skin every Sunday.     . furosemide (LASIX) 20 MG tablet Take 1 tablet (20 mg total) by mouth daily as needed for fluid. 30 tablet 3  . ibuprofen (ADVIL) 800 MG tablet Take 800 mg by mouth every 8 (eight) hours as needed.    . insulin glargine (LANTUS) 100 UNIT/ML injection Inject 30 Units into the skin 2 (two) times daily.    . irbesartan (AVAPRO) 150 MG tablet Take 1 tablet (150 mg total) by mouth daily. 30 tablet 11  . meloxicam (MOBIC) 15 MG tablet Take 15 mg by mouth daily.    . metFORMIN (GLUCOPHAGE) 500 MG tablet Take 500 mg by mouth 2 (two) times daily with a meal.    .  metoprolol tartrate (LOPRESSOR) 50 MG tablet Take 1 tablet (50 mg total) by mouth 2 (two) times daily. 180 tablet 3  . ranolazine (RANEXA) 500 MG 12 hr tablet Take 500 mg by mouth 2 (two) times daily.    . rosuvastatin (CRESTOR) 40 MG tablet Take 1 tablet (40 mg total) by mouth daily. 90 tablet 3   Social History   Socioeconomic History  . Marital status: Married    Spouse name: Not on file  . Number of children: Not on file  . Years of education: Not on file  . Highest education level: Not on file  Occupational History  . Not on file  Tobacco Use  . Smoking status: Never Smoker  . Smokeless tobacco: Never Used  Vaping Use  . Vaping Use: Never used  Substance and Sexual Activity  . Alcohol use: Yes    Comment: seldom   . Drug use: No  . Sexual activity: Yes    Birth control/protection: None  Other Topics Concern  . Not on file  Social History Narrative  . Not on file   Social Determinants of Health   Financial Resource Strain:   . Difficulty of Paying Living Expenses: Not on file  Food Insecurity:   . Worried About Charity fundraiser in the Last Year: Not on file  . Ran Out of Food in the Last Year: Not on file  Transportation Needs:   . Lack of Transportation (Medical): Not on file  . Lack of Transportation (Non-Medical): Not on file  Physical Activity:   . Days of Exercise per Week: Not on file  . Minutes of Exercise per Session: Not on file  Stress:   . Feeling of Stress : Not on file  Social Connections:   . Frequency of Communication with Friends and Family: Not on file  . Frequency of Social Gatherings with Friends and Family: Not on file  . Attends Religious Services: Not on file  . Active Member of Clubs or Organizations: Not on file  . Attends Archivist Meetings: Not on file  . Marital Status: Not on file  Intimate Partner Violence:   . Fear of Current or Ex-Partner: Not on file  . Emotionally Abused: Not on file  . Physically Abused: Not on file  . Sexually Abused: Not on file   Family History  Problem Relation Age of Onset  . Cancer Mother   . Cancer Father   . Stroke Brother   . Hypertension Brother     OBJECTIVE:  Vitals:   05/02/20 1348  BP: 133/83  Pulse: 83  Resp: 19  Temp: 98.7 F (37.1 C)  SpO2: 94%     General appearance: alert; appears fatigued, but nontoxic; speaking in full sentences and tolerating own secretions HEENT: NCAT; Ears: EACs clear, TMs pearly gray; Eyes: PERRL.  EOM grossly intact. Sinuses: nontender; Nose: nares patent without rhinorrhea, Throat: oropharynx clear, tonsils non erythematous or enlarged, uvula midline  Neck: supple without LAD Lungs: unlabored respirations, symmetrical air entry; cough: moderate; no respiratory  distress; CTAB Heart: regular rate and rhythm.  Radial pulses 2+ symmetrical bilaterally Skin: warm and dry Psychological: alert and cooperative; normal mood and affect  LABS:  No results found for this or any previous visit (from the past 24 hour(s)).   ASSESSMENT & PLAN:  1. URI with cough and congestion   2. Encounter for screening for COVID-19     Meds ordered this encounter  Medications  . azithromycin (ZITHROMAX)  250 MG tablet    Sig: Take 1 tablet (250 mg total) by mouth daily. Take first 2 tablets together, then 1 every day until finished.    Dispense:  6 tablet    Refill:  0  . albuterol (VENTOLIN HFA) 108 (90 Base) MCG/ACT inhaler    Sig: Inhale 1-2 puffs into the lungs every 6 (six) hours as needed for wheezing or shortness of breath.    Dispense:  18 g    Refill:  0  . benzonatate (TESSALON) 100 MG capsule    Sig: Take 1 capsule (100 mg total) by mouth 3 (three) times daily as needed for cough.    Dispense:  30 capsule    Refill:  0    Discharge instructions...    COVID testing ordered.  It will take between 2-7 days for test results.  Someone will contact you regarding abnormal results.    In the meantime: You should remain isolated in your home for 10 days from symptom onset AND greater than 24 hours after symptoms resolution (absence of fever without the use of fever-reducing medication and improvement in respiratory symptoms), whichever is longer Get plenty of rest and push fluids Tessalon Perles prescribed for cough Azithromycin was prescribed  ProAir was prescribed Use medications daily for symptom relief Use OTC medications like ibuprofen or tylenol as needed fever or pain Call or go to the ED if you have any new or worsening symptoms such as fever, worsening cough, shortness of breath, chest tightness, chest pain, turning blue, changes in mental status, etc...   Reviewed expectations re: course of current medical issues. Questions answered. Outlined  signs and symptoms indicating need for more acute intervention. Patient verbalized understanding. After Visit Summary given.         Emerson Monte, [place] 05/02/20 1420

## 2020-05-02 NOTE — Progress Notes (Deleted)
Cardiology Office Note  Date: 05/02/2020   ID: Christina Cameron, Christina Cameron [birth year], MRN [id number]  PCP:  Bonnita Hollow, MD  Cardiologist:  No primary care provider on file. Electrophysiologist:  None   Chief Complaint: Palpitations  History of Present Illness: Christina Cameron is a 54 y.o. female with a history of palpitations, chest pain, HTN, HLD, bilateral leg edema, morbid obesity.  Last encounter with Dr. Bronson Ing via telemedicine 08/11/2019.  She was diagnosed with COVID-19 virus 05/29/2019.  Since then she had been experiencing chest tightness, shortness of breath, palpitations, dizziness and eyes hurting.  Also reported burning in her feet and swelling of her hands.  Blood pressures were staying elevated as well.  She tested positive again on 07/21/2019.  CXR 06/20/2019 showed slight bronchitic changes.  She was currently taking Ranexa 500 mg p.o. twice daily and felt like this helped her symptoms.  She was not taking Imdur.  Dr. Bronson Ing stated chest pain was noncardiac and current symptoms were likely part of post Covid syndrome.  Blood pressure was markedly elevated.  Lisinopril was increased to 10 mg daily.  Lipid labs were reviewed and were found to be markedly elevated.  Her rosuvastatin  had been recently increased to 40 mg daily.  She was continuing Lasix 20 mg as needed for bilateral leg edema.  Echo showed diastolic dysfunction may be playing a role.  Needed aggressive weight loss.  She was having more palpitations.  She had undergone a previous SVT ablation in 2005.  An event monitor was ordered.  Lopressor was increased to 25 mg p.o. twice daily.  Cardiac monitor 09/23/2019 showed sinus rhythm and sinus tachycardia with rare PACs and very brief atrial run.  No sustained arrhythmias.  She is here for 33-month follow-up.  She states she is having some more issues with palpitations.  He states the palpitations seems to have gotten worse since she had the Covid virus.  States she  sometimes wakes up with palpitations even with increased doses of metoprolol.  Continues to complain of shortness of breath and chest pressure with ambulation and increased activity.  She states her diabetes has not been well controlled running in the high 280s to 300 range.  She has established with a new physician Dr. Grandville Silos at [facility].  Her blood pressure is well controlled on current therapy.  She admits to snoring-like symptoms and think she likely does have sleep apnea.  She admits she is significantly overweight and needs to lose weight.  Also complaining of lower extremity calf pain when ambulating relieved at rest.  She was pending knee replacement surgery but states she was told she needed better control of her diabetes and weight loss before she could have the surgery.  Past Medical History:  Diagnosis Date  . Acute bronchitis, unspecified   . Arthritis    right knee  . Cancer ([place])    cervical 1989  . Chest pain    a. normal cors by cath in 11/2014  . Chronic diastolic (congestive) heart failure ([place])   . Diabetes mellitus without complication ([place])   . Enlarged heart   . High cholesterol   . Hyperlipidemia, unspecified   . Hypertension   . Leaky heart valve   . Morbid (severe) obesity due to excess calories ([place])   . Osteoarthritis of knee, unspecified   . Palpitations   . Raynaud's syndrome   . Somnolence   . SVT (supraventricular tachycardia) ([facility])    a. s/p  ablation by Dr. Lovena Le in 2006.  . Tachycardia   . Type 2 diabetes mellitus with hyperglycemia ([place])   . Unspecified asthma, uncomplicated     Past Surgical History:  Procedure Laterality Date  . CARDIAC CATHETERIZATION    . CARDIAC CATHETERIZATION N/A 12/14/2014   Procedure: Right/Left Heart Cath and Coronary Angiography;  Surgeon: Sherren Mocha, MD;  Location: [facility];  Service: Cardiovascular;  Laterality: N/A;  . CARDIAC ELECTROPHYSIOLOGY STUDY AND ABLATION    . CESAREAN SECTION      x2  . ENDOMETRIAL ABLATION      Current Outpatient Medications  Medication Sig Dispense Refill  . albuterol (VENTOLIN HFA) 108 (90 Base) MCG/ACT inhaler Inhale 1-2 puffs into the lungs every 6 (six) hours as needed for wheezing or shortness of breath. 18 g 0  . azithromycin (ZITHROMAX) 250 MG tablet Take 1 tablet (250 mg total) by mouth daily. Take first 2 tablets together, then 1 every day until finished. 6 tablet 0  . benzonatate (TESSALON) 100 MG capsule Take 1 capsule (100 mg total) by mouth 3 (three) times daily as needed for cough. 30 capsule 0  . cetirizine (ZYRTEC ALLERGY) 10 MG tablet Take 1 tablet (10 mg total) by mouth daily. (Patient taking differently: Take 10 mg by mouth daily as needed. ) 30 tablet 0  . desvenlafaxine (PRISTIQ) 50 MG 24 hr tablet Take 1 tablet (50 mg total) by mouth daily. 30 tablet 2  . diltiazem (CARDIZEM) 30 MG tablet Take 1 tablet (30 mg total) by mouth 2 (two) times daily as needed (palpitations). 60 tablet 2  . Dulaglutide (TRULICITY) 1.5 WN/0.2VO SOPN Inject 1.5 mg into the skin every Sunday.     . furosemide (LASIX) 20 MG tablet Take 1 tablet (20 mg total) by mouth daily as needed for fluid. 30 tablet 3  . ibuprofen (ADVIL) 800 MG tablet Take 800 mg by mouth every 8 (eight) hours as needed.    . insulin glargine (LANTUS) 100 UNIT/ML injection Inject 30 Units into the skin 2 (two) times daily.    . irbesartan (AVAPRO) 150 MG tablet Take 1 tablet (150 mg total) by mouth daily. 30 tablet 11  . meloxicam (MOBIC) 15 MG tablet Take 15 mg by mouth daily.    . metFORMIN (GLUCOPHAGE) 500 MG tablet Take 500 mg by mouth 2 (two) times daily with a meal.    . metoprolol tartrate (LOPRESSOR) 50 MG tablet Take 1 tablet (50 mg total) by mouth 2 (two) times daily. 180 tablet 3  . ranolazine (RANEXA) 500 MG 12 hr tablet Take 500 mg by mouth 2 (two) times daily.    . rosuvastatin (CRESTOR) 40 MG tablet Take 1 tablet (40 mg total) by mouth daily. 90 tablet 3   No current  facility-administered medications for this visit.   Allergies:  Patient has no known allergies.   Social History: The patient  reports that she has never smoked. She has never used smokeless tobacco. She reports current alcohol use. She reports that she does not use drugs.   Family History: The patient's family history includes Cancer in her father and mother; Hypertension in her brother; Stroke in her brother.   ROS:  Please see the history of present illness. Otherwise, complete review of systems is positive for none.  All other systems are reviewed and negative.   Physical Exam: VS:  There were no vitals taken for this visit., BMI There is no height or weight on file to calculate  BMI.  Wt Readings from Last 3 Encounters:  02/26/20 277 lb 3.2 oz (125.7 kg)  12/24/19 (!) 275 lb (124.7 kg)  11/28/19 271 lb (122.9 kg)    General: Severely morbidly obese patient appears comfortable at rest. Neck: Supple, no elevated JVP or carotid bruits, no thyromegaly. Lungs: Clear to auscultation, nonlabored breathing at rest. Cardiac: Regular rate and rhythm, no S3 or significant systolic murmur, no pericardial rub. Extremities: No pitting edema, distal pulses 2+. Skin: Warm and dry. Musculoskeletal: No kyphosis. Neuropsychiatric: Alert and oriented x3, affect grossly appropriate.  ECG:  EKG on May 29, 2019 sinus tachycardia rate of 108, right superior axis deviation, cannot rule out anteroseptal infarct, age undetermined  Recent Labwork: 06/01/2019: ALT 50; AST 42; BUN 17; Creatinine, Ser 0.52; Hemoglobin 14.9; Platelets 228; Potassium 4.2; Sodium 136     Component Value Date/Time   CHOL 229 (H) 08/07/2019 0814   TRIG 163 (H) 08/07/2019 0814   HDL 47 (L) 08/07/2019 0814   CHOLHDL 4.9 08/07/2019 0814   LDLCALC 152 (H) 08/07/2019 0814    Other Studies Reviewed Today:  Nuclear stress test 03/02/2020 Narrative & Impression   There was no ST segment deviation noted during stress.  The  study is normal. There are no perfusion defects consistent with prior infarct or current ischemia.  This is a low risk study.  The left ventricular ejection fraction is normal (55-65%).    Vascular lower extremity arterial duplex/ABI 04/20/2020  Right: Resting right ankle-brachial index is within normal range. No evidence of significant right lower extremity arterial disease. The right toe-brachial index is normal. Left: Resting left ankle-brachial index is within normal range. No evidence of significant left lower extremity arterial disease. The left toe-brachial index is normal.  Lower venous study 12/29/2019 left leg IMPRESSION: No evidence of deep venous thrombosis   Cardiac monitor 09/23/2019 Study Highlights Sinus rhythm and sinus tachycardia with rare PACs and a very brief atrial run. There were no sustained arrhythmias.    Cardiac catheterization on 12/14/2014 demonstrated angiographically normal coronary arteries and normal intracardiac pressures.   Echocardiogram 07/21/2019:  1. Left ventricular ejection fraction, by estimation, is 60 to 65%. The  left ventricle has normal function. The left ventricle has no regional  wall motion abnormalities. There is moderate concentric left ventricular  hypertrophy. Left ventricular  diastolic parameters are consistent with Grade I diastolic dysfunction  (impaired relaxation).  2. Right ventricular systolic function is normal. The right ventricular  size is mildly enlarged.  3. Left atrial size was mildly dilated.  4. The mitral valve is grossly normal. No evidence of mitral valve  regurgitation.  5. The aortic valve is grossly normal. Aortic valve regurgitation is not  visualized.  6. The inferior vena cava is normal in size with greater than 50%  respiratory variability, suggesting right atrial pressure of 3 mmHg.  Assessment and Plan:    1. Preoperative clearance Patient has pending knee surgery by Dr. French Ana  but has been told by primary care provider that she must have better control over her diabetes before surgery can proceed.  Also states she has been told she needs to lose approximately 30 pounds before she can undergo the surgery.  She is having some chest pain with exertional dyspnea.  We are ordering a Lexiscan stress test.  At this time patient cannot be cleared for surgery.  2. Palpitations Continues with palpitations in spite of increasing beta-blocker therapy.  She continues on metoprolol 50 mg p.o. twice daily.  Continues to have breakthrough palpitations.  Start Cardizem 30 mg p.o. twice daily for palpitations.  3. Chest pain, unspecified type Complaining of increasing chest pain with exertional dyspnea.  She is on ranolazine 500 mg p.o. twice daily.  Not on Imdur.  Please get a Lexiscan stress test to reassess for ischemia.  Last ischemic evaluation with cardiac cath in 2016 showed normal coronary arteries.  She has multiple risk factors for cardiac disease including uncontrolled diabetes, hypertension, morbid obesity, significant hyperlipidemia.   4. Essential hypertension, benign Blood pressure is well controlled on current therapy.  Continue irbesartan 150 mg daily, metoprolol 50 mg p.o. twice daily.   5. Bilateral leg edema No significant bilateral leg edema noted.  Continue Lasix 20 mg as needed for fluid.  6. Morbid obesity, unspecified obesity type ([place]) Significant morbid obesity with a BMI of 47.58.  Patient needs aggressive weight loss.  Patient states she has been told by surgeon she needs to lose approximately 30 pounds before she can proceed with knee surgery  7.  Bilateral leg pain Complaining of bilateral calf pain when walking and relieved with rest.  Please get lower extremity arterial Doppler with ABIs.  8.  Suspected sleep apnea Patient states she has a significant history of snoring with significant fatigue.  Suspect sleep apnea given her weight and snoring as well  as significant obesity.  Please refer for sleep study  Medication Adjustments/Labs and Tests Ordered: Current medicines are reviewed at length with the patient today.  Concerns regarding medicines are outlined above.   Disposition: Follow-up with Dr. Harl Bowie.  Patient wants to establish with him follow-up in 4 to 6 weeks Signed, [name], NP 05/02/2020 6:55 PM    [facility] [place], [place], [place] [address] Phone: [phone]; Fax: [phone]

## 2020-05-02 NOTE — Discharge Instructions (Signed)
COVID testing ordered.  It will take between 2-7 days for test results.  Someone will contact you regarding abnormal results.    In the meantime: You should remain isolated in your home for 10 days from symptom onset AND greater than 24 hours after symptoms resolution (absence of fever without the use of fever-reducing medication and improvement in respiratory symptoms), whichever is longer Get plenty of rest and push fluids Tessalon Perles prescribed for cough Azithromycin was prescribed  ProAir was prescribed Use medications daily for symptom relief Use OTC medications like ibuprofen or tylenol as needed fever or pain Call or go to the ED if you have any new or worsening symptoms such as fever, worsening cough, shortness of breath, chest tightness, chest pain, turning blue, changes in mental status, etc.

## 2020-05-03 ENCOUNTER — Ambulatory Visit: Payer: Medicaid Other | Admitting: Family Medicine

## 2020-05-03 LAB — SARS-COV-2, NAA 2 DAY TAT

## 2020-05-03 LAB — NOVEL CORONAVIRUS, NAA: SARS-CoV-2, NAA: NOT DETECTED

## 2020-05-06 ENCOUNTER — Institutional Professional Consult (permissible substitution): Payer: Medicaid Other | Admitting: Pulmonary Disease

## 2020-05-12 ENCOUNTER — Telehealth: Payer: Medicaid Other | Admitting: Nurse Practitioner

## 2020-05-12 DIAGNOSIS — J069 Acute upper respiratory infection, unspecified: Secondary | ICD-10-CM

## 2020-05-12 NOTE — Progress Notes (Signed)
Based on what you shared with me it looks like you have upper resp infection,that should be evaluated in a face to face office visit. You have already taken a z pak. You will need further evaluation in oreder to get more antibiotics and steroids.     NOTE: If you entered your credit card information for this eVisit, you will not be charged. You may see a "hold" on your card for the $35 but that hold will drop off and you will not have a charge processed.  If you are having a true medical emergency please call 911.     For an urgent face to face visit, [place] has four urgent care centers for your convenience:   . [facility]    [phone]                  Get Driving Directions  [address] [address] . 10 am to 8 pm Monday-Friday . 12 pm to 8 pm Saturday-Sunday   . [facility] [place]                  Get Driving Directions  [address], [place], [place] [address] . 8 am to 8 pm Monday-Friday . 9 am to 6 pm Saturday . 11 am to 6 pm Sunday   . [facility] at [place]                  Get Driving Directions   [address], [place] [address] . 8 am to 8 pm Monday-Friday . 8 am to 4 pm Saturday-Sunday    . [facility] [place]                    Get Driving Directions  [phone]  [address] [place], [place] [address]  . Monday-Friday, 12 PM to 6 PM    Your e-visit answers were reviewed by a board certified advanced clinical practitioner to complete your personal care plan.  Thank you for using e-Visits.

## 2020-05-18 NOTE — Progress Notes (Addendum)
This is a telemedicine visit Patient location: Home Provider location: Office  Cardiology Office Note  Date: 05/19/2020   ID: Christina Cameron, Christina Cameron [birth year], MRN [id number]  PCP:  Bonnita Hollow, MD  Cardiologist:  No primary care provider on file. Electrophysiologist:  None   Chief Complaint: Palpitations  History of Present Illness: Christina Cameron is a 54 y.o. female with a history of palpitations, chest pain, HTN, HLD, bilateral leg edema, morbid obesity.  Last encounter with Dr. Bronson Ing via telemedicine 08/11/2019.  She was diagnosed with COVID-19 virus 05/29/2019.  Since then she had been experiencing chest tightness, shortness of breath, palpitations, dizziness and eyes hurting.  Also reported burning in her feet and swelling of her hands.  Blood pressures were staying elevated as well.  She tested positive again on 07/21/2019.  CXR 06/20/2019 showed slight bronchitic changes.  She was currently taking Ranexa 500 mg p.o. twice daily and felt like this helped her symptoms.  She was not taking Imdur.  Dr. Bronson Ing stated chest pain was noncardiac and current symptoms were likely part of post Covid syndrome.  Blood pressure was markedly elevated.  Lisinopril was increased to 10 mg daily.  Lipid labs were reviewed and were found to be markedly elevated.  Her rosuvastatin  had been recently increased to 40 mg daily.  She was continuing Lasix 20 mg as needed for bilateral leg edema.  Echo showed diastolic dysfunction may be playing a role.  Needed aggressive weight loss.  She was having more palpitations.  She had undergone a previous SVT ablation in 2005.  An event monitor was ordered.  Lopressor was increased to 25 mg p.o. twice daily.  Cardiac monitor 09/23/2019 showed sinus rhythm and sinus tachycardia with rare PACs and very brief atrial run.  No sustained arrhythmias.  Was here last visit for 76-month follow-up.  States she was having  more issues with palpitations.  Stated the  palpitations seemed to become worse since she had the Covid virus.  Sometimes awakened with palpitations even with increased doses of metoprolol.  Continue to complain of shortness of breath and chest pressure with ambulation and increased activity.  Stated her diabetes has not been well controlled running in the high 280s to 300 range.  She had established with a new physician Dr. Grandville Silos at [facility].  Her blood pressure was well controlled on current therapy.  She admitted to snoring-like symptoms believed she likely does had sleep apnea.  She admitted she was significantly overweight and needs to lose weight.  Also complained of lower extremity calf pain when ambulating relieved at rest.  She was pending knee replacement surgery but states she was told she needed better control of her diabetes and weight loss before she could have the surgery.   Past Medical History:  Diagnosis Date  . Acute bronchitis, unspecified   . Arthritis    right knee  . Cancer ([place])    cervical 1989  . Chest pain    a. normal cors by cath in 11/2014  . Chronic diastolic (congestive) heart failure ([place])   . Diabetes mellitus without complication ([place])   . Enlarged heart   . High cholesterol   . Hyperlipidemia, unspecified   . Hypertension   . Leaky heart valve   . Morbid (severe) obesity due to excess calories ([place])   . Osteoarthritis of knee, unspecified   . Palpitations   . Raynaud's syndrome   . Somnolence   . SVT (supraventricular tachycardia) (  [place])    a. s/p ablation by Dr. Lovena Le in 2006.  . Tachycardia   . Type 2 diabetes mellitus with hyperglycemia ([place])   . Unspecified asthma, uncomplicated     Past Surgical History:  Procedure Laterality Date  . CARDIAC CATHETERIZATION    . CARDIAC CATHETERIZATION N/A 12/14/2014   Procedure: Right/Left Heart Cath and Coronary Angiography;  Surgeon: Sherren Mocha, MD;  Location: [facility];  Service: Cardiovascular;  Laterality: N/A;  . CARDIAC  ELECTROPHYSIOLOGY STUDY AND ABLATION    . CESAREAN SECTION     x2  . ENDOMETRIAL ABLATION      Current Outpatient Medications  Medication Sig Dispense Refill  . albuterol (VENTOLIN HFA) 108 (90 Base) MCG/ACT inhaler Inhale 1-2 puffs into the lungs every 6 (six) hours as needed for wheezing or shortness of breath. 18 g 0  . cetirizine (ZYRTEC ALLERGY) 10 MG tablet Take 1 tablet (10 mg total) by mouth daily. (Patient taking differently: Take 10 mg by mouth daily as needed.) 30 tablet 0  . desvenlafaxine (PRISTIQ) 100 MG 24 hr tablet Take 100 mg by mouth daily.    Marland Kitchen diltiazem (CARDIZEM) 30 MG tablet Take 1 tablet (30 mg total) by mouth 2 (two) times daily as needed (palpitations). 60 tablet 2  . Dulaglutide (TRULICITY) 1.5 0000000 SOPN Inject 1.5 mg into the skin every Sunday.     Marland Kitchen FARXIGA 10 MG TABS tablet Take 10 mg by mouth daily.    . furosemide (LASIX) 20 MG tablet Take 1 tablet (20 mg total) by mouth daily as needed for fluid. 30 tablet 3  . ibuprofen (ADVIL) 800 MG tablet Take 800 mg by mouth every 8 (eight) hours as needed.    . insulin glargine (LANTUS) 100 UNIT/ML injection Inject 30 Units into the skin 2 (two) times daily.    . irbesartan (AVAPRO) 300 MG tablet Take 300 mg by mouth daily.    . meloxicam (MOBIC) 15 MG tablet Take 15 mg by mouth daily.    . metFORMIN (GLUCOPHAGE) 500 MG tablet Take 1,000 mg by mouth 2 (two) times daily with a meal.    . metoprolol tartrate (LOPRESSOR) 50 MG tablet Take 50 mg by mouth 2 (two) times daily.    . pregabalin (LYRICA) 100 MG capsule Take 100 mg by mouth 3 (three) times daily.    . ranolazine (RANEXA) 500 MG 12 hr tablet Take 500 mg by mouth 2 (two) times daily.    . rosuvastatin (CRESTOR) 40 MG tablet Take 40 mg by mouth daily.     No current facility-administered medications for this visit.   Allergies:  Patient has no known allergies.   Social History: The patient  reports that she has never smoked. She has never used smokeless  tobacco. She reports current alcohol use. She reports that she does not use drugs.   Family History: The patient's family history includes Cancer in her father and mother; Hypertension in her brother; Stroke in her brother.   ROS:  Please see the history of present illness. Otherwise, complete review of systems is positive for none.  All other systems are reviewed and negative.   Physical Exam: VS:  BP (!) 146/85   Ht 5\' 4"  (1.626 m)   Wt 265 lb (120.2 kg)   BMI 45.49 kg/m , BMI Body mass index is 45.49 kg/m.  Wt Readings from Last 3 Encounters:  05/19/20 265 lb (120.2 kg)  02/26/20 277 lb 3.2 oz (125.7 kg)  12/24/19 Marland Kitchen)  275 lb (124.7 kg)    General: Severely morbidly obese patient appears comfortable at rest. Neck: Supple, no elevated JVP or carotid bruits, no thyromegaly. Lungs: Clear to auscultation, nonlabored breathing at rest. Cardiac: Regular rate and rhythm, no S3 or significant systolic murmur, no pericardial rub. Extremities: No pitting edema, distal pulses 2+. Skin: Warm and dry. Musculoskeletal: No kyphosis. Neuropsychiatric: Alert and oriented x3, affect grossly appropriate.  ECG:  EKG on May 29, 2019 sinus tachycardia rate of 108, right superior axis deviation, cannot rule out anteroseptal infarct, age undetermined  Recent Labwork: 06/01/2019: ALT 50; AST 42; BUN 17; Creatinine, Ser 0.52; Hemoglobin 14.9; Platelets 228; Potassium 4.2; Sodium 136     Component Value Date/Time   CHOL 229 (H) 08/07/2019 0814   TRIG 163 (H) 08/07/2019 0814   HDL 47 (L) 08/07/2019 0814   CHOLHDL 4.9 08/07/2019 0814   LDLCALC 152 (H) 08/07/2019 0814    Other Studies Reviewed Today:  Nuclear stress test 03/02/2020 Narrative & Impression   There was no ST segment deviation noted during stress.  The study is normal. There are no perfusion defects consistent with prior infarct or current ischemia.  This is a low risk study.  The left ventricular ejection fraction is normal  (55-65%).    Vascular lower extremity arterial duplex/ABI 04/20/2020 Right: Resting right ankle-brachial index is within normal range. No evidence of significant right lower extremity arterial disease. The right toe-brachial index is normal. Left: Resting left ankle-brachial index is within normal range. No evidence of significant left lower extremity arterial disease. The left toe-brachial index is normal.  Lower venous study 12/29/2019 left leg IMPRESSION: No evidence of deep venous thrombosis   Cardiac monitor 09/23/2019 Study Highlights Sinus rhythm and sinus tachycardia with rare PACs and a very brief atrial run. There were no sustained arrhythmias.    Cardiac catheterization on 12/14/2014 demonstrated angiographically normal coronary arteries and normal intracardiac pressures.   Echocardiogram 07/21/2019:  1. Left ventricular ejection fraction, by estimation, is 60 to 65%. The  left ventricle has normal function. The left ventricle has no regional  wall motion abnormalities. There is moderate concentric left ventricular  hypertrophy. Left ventricular  diastolic parameters are consistent with Grade I diastolic dysfunction  (impaired relaxation).  2. Right ventricular systolic function is normal. The right ventricular  size is mildly enlarged.  3. Left atrial size was mildly dilated.  4. The mitral valve is grossly normal. No evidence of mitral valve  regurgitation.  5. The aortic valve is grossly normal. Aortic valve regurgitation is not  visualized.  6. The inferior vena cava is normal in size with greater than 50%  respiratory variability, suggesting right atrial pressure of 3 mmHg.  Assessment and Plan:    1. Preoperative clearance Patient has pending knee surgery by Dr. Madelon Lips but has been told by primary care provider that she must have better control over her diabetes before surgery can proceed.  Also states she has been told she needs to lose  approximately 30 pounds before she can undergo the surgery.  She is having some chest pain with exertional dyspnea.  We are ordering a Lexiscan stress test.  At this time patient cannot be cleared for surgery.  2. Palpitations States she notes some improvement since starting the Cardizem 30 mg p.o. twice daily but still has some palpitations.  Advised we would increase Cardizem to 60 mg p.o. twice daily.  3. Chest pain, unspecified type Discussed that recent stress test result and the fact  that it was negative.  She continues with some chest pressure and exertional dyspnea.  She is on ranolazine 500 mg p.o. twice daily.  Not on Imdur.  She has multiple risk factors for cardiac disease including uncontrolled diabetes, hypertension, morbid obesity, significant hyperlipidemia.   4. Essential hypertension, benign Blood pressure elevated at 146/85 today.  Continue irbesartan 150 mg daily, metoprolol 50 mg p.o. twice daily.   5. Bilateral leg edema No significant bilateral leg edema noted.  Continue Lasix 20 mg as needed for fluid.  6. Morbid obesity, unspecified obesity type ([place]) Significant morbid obesity with a BMI of 47.58.  Patient needs aggressive weight loss.  Patient states she has been told by surgeon she needs to lose approximately 30 pounds before she can proceed with knee surgery  7.  Bilateral leg pain Complaining of bilateral calf pain when walking and relieved with rest.  Recent lower extremity arterial Doppler and ABI were negative for evidence of peripheral arterial disease.  Discussed results with patient.  8.  Suspected sleep apnea Patient states she has a significant history of snoring with significant fatigue.  Suspect sleep apnea given her weight and snoring as well as significant obesity.  Please refer for sleep study  Medication Adjustments/Labs and Tests Ordered: Current medicines are reviewed at length with the patient today.  Concerns regarding medicines are outlined  above.   Disposition: Follow-up with Dr. Harl Bowie or APP 6 months  Signed, [name], NP 05/19/2020 9:05 AM    [facility] [place], [place], [place] [address] Phone: [phone]; Fax: [phone]

## 2020-05-19 ENCOUNTER — Other Ambulatory Visit: Payer: Self-pay

## 2020-05-19 ENCOUNTER — Encounter: Payer: Self-pay | Admitting: Family Medicine

## 2020-05-19 ENCOUNTER — Telehealth (INDEPENDENT_AMBULATORY_CARE_PROVIDER_SITE_OTHER): Payer: Medicaid Other | Admitting: Family Medicine

## 2020-05-19 ENCOUNTER — Telehealth: Payer: Self-pay | Admitting: Family Medicine

## 2020-05-19 VITALS — BP 146/85 | Ht 64.0 in | Wt 265.0 lb

## 2020-05-19 DIAGNOSIS — Z01818 Encounter for other preprocedural examination: Secondary | ICD-10-CM | POA: Diagnosis not present

## 2020-05-19 DIAGNOSIS — M79604 Pain in right leg: Secondary | ICD-10-CM

## 2020-05-19 DIAGNOSIS — R079 Chest pain, unspecified: Secondary | ICD-10-CM

## 2020-05-19 DIAGNOSIS — I10 Essential (primary) hypertension: Secondary | ICD-10-CM | POA: Diagnosis not present

## 2020-05-19 DIAGNOSIS — M79605 Pain in left leg: Secondary | ICD-10-CM

## 2020-05-19 DIAGNOSIS — R002 Palpitations: Secondary | ICD-10-CM | POA: Diagnosis not present

## 2020-05-19 DIAGNOSIS — R600 Localized edema: Secondary | ICD-10-CM

## 2020-05-19 DIAGNOSIS — R29818 Other symptoms and signs involving the nervous system: Secondary | ICD-10-CM

## 2020-05-19 MED ORDER — DILTIAZEM HCL 60 MG PO TABS: 60.0000 mg | ORAL_TABLET | Freq: Two times a day (BID) | ORAL | 1 refills | Status: DC

## 2020-05-19 NOTE — Patient Instructions (Addendum)
Medication Instructions:   Your physician has recommended you make the following change in your medication:   Increase diltiazem to 60 mg twice daily  Continue other medications the same  Labwork:  None  Testing/Procedures:  None  Follow-Up:  Your physician recommends that you schedule a follow-up appointment in: 6 months.  Any Other Special Instructions Will Be Listed Below (If Applicable).  If you need a refill on your cardiac medications before your next appointment, please call your pharmacy.

## 2020-05-19 NOTE — Telephone Encounter (Signed)
  Patient Consent for Virtual Visit         Christina Cameron has provided verbal consent on 05/19/2020 for a virtual visit (video or telephone).   CONSENT FOR VIRTUAL VISIT FOR:  Christina Cameron  By participating in this virtual visit I agree to the following:  I hereby voluntarily request, consent and authorize [facility] and its employed or contracted physicians, physician assistants, nurse practitioners or other licensed health care professionals (the Practitioner), to provide me with telemedicine health care services (the "Services") as deemed necessary by the treating Practitioner. I acknowledge and consent to receive the Services by the Practitioner via telemedicine. I understand that the telemedicine visit will involve communicating with the Practitioner through live audiovisual communication technology and the disclosure of certain medical information by electronic transmission. I acknowledge that I have been given the opportunity to request an in-person assessment or other available alternative prior to the telemedicine visit and am voluntarily participating in the telemedicine visit.  I understand that I have the right to withhold or withdraw my consent to the use of telemedicine in the course of my care at any time, without affecting my right to future care or treatment, and that the Practitioner or I may terminate the telemedicine visit at any time. I understand that I have the right to inspect all information obtained and/or recorded in the course of the telemedicine visit and may receive copies of available information for a reasonable fee.  I understand that some of the potential risks of receiving the Services via telemedicine include:  Marland Kitchen Delay or interruption in medical evaluation due to technological equipment failure or disruption; . Information transmitted may not be sufficient (e.g. poor resolution of images) to allow for appropriate medical decision making by the Practitioner;  and/or  . In rare instances, security protocols could fail, causing a breach of personal health information.  Furthermore, I acknowledge that it is my responsibility to provide information about my medical history, conditions and care that is complete and accurate to the best of my ability. I acknowledge that Practitioner's advice, recommendations, and/or decision may be based on factors not within their control, such as incomplete or inaccurate data provided by me or distortions of diagnostic images or specimens that may result from electronic transmissions. I understand that the practice of medicine is not an exact science and that Practitioner makes no warranties or guarantees regarding treatment outcomes. I acknowledge that a copy of this consent can be made available to me via my patient portal ([facility]), or I can request a printed copy by calling the office of [facility].    I understand that my insurance will be billed for this visit.   I have read or had this consent read to me. . I understand the contents of this consent, which adequately explains the benefits and risks of the Services being provided via telemedicine.  . I have been provided ample opportunity to ask questions regarding this consent and the Services and have had my questions answered to my satisfaction. . I give my informed consent for the services to be provided through the use of telemedicine in my medical care

## 2020-06-04 ENCOUNTER — Institutional Professional Consult (permissible substitution): Payer: Medicaid Other | Admitting: Pulmonary Disease

## 2020-06-13 ENCOUNTER — Telehealth: Payer: Medicaid Other | Admitting: Family

## 2020-06-13 DIAGNOSIS — L03119 Cellulitis of unspecified part of limb: Secondary | ICD-10-CM

## 2020-06-13 MED ORDER — CEPHALEXIN 500 MG PO CAPS: 500.0000 mg | ORAL_CAPSULE | Freq: Four times a day (QID) | ORAL | 0 refills | Status: AC

## 2020-06-13 NOTE — Progress Notes (Signed)
E Visit for Cellulitis  We are sorry that you are not feeling well. Here is how we plan to help!  Based on what you shared with me it looks like you have cellulitis.  Cellulitis looks like areas of skin redness, swelling, and warmth; it develops as a result of bacteria entering under the skin. Little red spots and/or bleeding can be seen in skin, and tiny surface sacs containing fluid can occur. Fever can be present. Cellulitis is almost always on one side of a body, and the lower limbs are the most common site of involvement.   I have prescribed:  Keflex 500mg take one by mouth four times a day for 5 days  HOME CARE:  . Take your medications as ordered and take all of them, even if the skin irritation appears to be healing.   GET HELP RIGHT AWAY IF:  . Symptoms that don't begin to go away within 48 hours. . Severe redness persists or worsens . If the area turns color, spreads or swells. . If it blisters and opens, develops yellow-brown crust or bleeds. . You develop a fever or chills. . If the pain increases or becomes unbearable.  . Are unable to keep fluids and food down.  MAKE SURE YOU    Understand these instructions.  Will watch your condition.  Will get help right away if you are not doing well or get worse.  Thank you for choosing an e-visit. Your e-visit answers were reviewed by a board certified advanced clinical practitioner to complete your personal care plan. Depending upon the condition, your plan could have included both over the counter or prescription medications. Please review your pharmacy choice. Make sure the pharmacy is open so you can pick up prescription now. If there is a problem, you may contact your provider through MyChart messaging and have the prescription routed to another pharmacy. Your safety is important to us. If you have drug allergies check your prescription carefully.  For the next 24 hours you can use MyChart to ask questions about today's  visit, request a non-urgent call back, or ask for a work or school excuse. You will get an email in the next two days asking about your experience. I hope that your e-visit has been valuable and will speed your recovery.  Approximately 5 minutes was spent documenting and reviewing patient's chart.   

## 2020-06-15 ENCOUNTER — Other Ambulatory Visit: Payer: Medicaid Other

## 2020-06-18 ENCOUNTER — Other Ambulatory Visit: Payer: Medicaid Other

## 2020-07-01 ENCOUNTER — Institutional Professional Consult (permissible substitution): Payer: Medicaid Other | Admitting: Pulmonary Disease

## 2020-07-28 ENCOUNTER — Telehealth: Payer: Self-pay | Admitting: Family Medicine

## 2020-07-28 NOTE — Telephone Encounter (Signed)
Patient called stating that 07/26/2020 she was sleeping . States that she woke up with shortness of breath, heart racing, arms feel weak. States that she has not felt right since this episode.  Requesting an appointment. She has appointment with Pulmonary doctor on [phone].

## 2020-07-28 NOTE — Telephone Encounter (Signed)
Pt c/o SOB since 2/28 has chest tightness/weakness in arms and shoulders - denies any weight gain/chest pain/dizziness at this time - some swelling in hands - HR this morning 98 BP yesterday 152/89 - denies any recent medication changes - has appt with pulmonology tomorrow and scheduled pt with Katina Dung, NP on Friday - pt aware that if symptoms escalate to have ED evaluation

## 2020-07-29 ENCOUNTER — Telehealth: Payer: Medicaid Other | Admitting: Emergency Medicine

## 2020-07-29 ENCOUNTER — Ambulatory Visit: Payer: Medicaid Other | Admitting: Pulmonary Disease

## 2020-07-29 DIAGNOSIS — S99929A Unspecified injury of unspecified foot, initial encounter: Secondary | ICD-10-CM

## 2020-07-29 NOTE — Progress Notes (Signed)
Hi Christina Cameron,  Based on what you shared with me, I feel your condition warrants further evaluation and I recommend that you be seen for a face to face office visit.   NOTE: If you entered your credit card information for this eVisit, you will not be charged. You may see a "hold" on your card for the $35 but that hold will drop off and you will not have a charge processed.   If you are having a true medical emergency please call 911.      For an urgent face to face visit, [facility] has five urgent care centers for your convenience:     [facility] [place] Get Driving Directions [phone] [address], [place] [address] . 10 am - 6pm Monday - Friday    [facility]) Get Driving Directions [phone] [address] [place], [place] [address] . 10 am to 8 pm Monday-Friday . 12 pm to 8 pm [facility] Get Driving Directions [phone] [place], [place], [place] [address] . 8 am to 8 pm Monday-Friday . 9 am to 6 pm Saturday . 11 am to 6 pm Sunday     [facility] at [facility] Get Driving Directions  [phone] [address] [place], [place] [address] . 8 am to 8 pm Monday-Friday . 8 am to 4 pm [facility] at [place] Get Driving Directions [phone] [address] [place], [place] [address] . 12 pm to 6 pm Monday-Friday      Your e-visit answers were reviewed by a board certified advanced clinical practitioner to complete your personal care plan.  Thank you for using e-Visits.     Approximately 5 minutes was spent documenting and reviewing patient's chart.

## 2020-07-29 NOTE — Progress Notes (Signed)
Cardiology Office Note  Date: 07/30/2020   ID: Amey, Hossain [birth year], MRN [id number]  PCP:  Bonnita Hollow, MD  Cardiologist:  No primary care provider on file. Electrophysiologist:  None   Chief Complaint: Shortness of breath  History of Present Illness: Christina Cameron is a 55 y.o. female with a history of  palpitations/previous SVT ablation 2005, chest pain, HTN, HLD, bilateral leg edema, morbid obesity.  Previous patient of  Dr. Bronson Ing. Previous diagnosis of COVID-19 virus 05/29/2019. Previously experiencing chest tightness, shortness of breath, palpitations, dizziness.  Started Ranexa 500 mg p.o. twice daily. Blood pressure previously markedly elevated. She was started on lisinopril 10 mg. Lipids markedly elevated in the past. Crestor previously increased to 40 mg daily. She was continuing Lasix as needed for lower extremity edema.  She is here today for recent issue on Monday around 2:58 AM waking up short of breath with chest tightness, racing heart, and dizziness.  She states she took her Ranexa which eventually resolved her chest tightness.  She states her blood sugar has been elevated recently with most recent random blood sugar  221.  Blood pressure  elevated today at 140/86 with a heart rate of 75.  Currently taking irbesartan 300 mg daily.  Furosemide 20 mg daily as needed for fluid.  Metoprolol 50 mg p.o. twice daily..  She has an upcoming appointment with Dr. Halford Chessman for evaluation for sleep apnea.  She is dealing with some significant stress due to the death of her daughter in late 06-10-2023 of this past year. In October 2021 she had a low risk stress test.  Previous cardiac monitor in April 2021 demonstrating sinus rhythm and sinus tachycardia with rare PACs and a brief atrial run.  No sustained arrhythmias.  Previous cardiac catheterization 2016 normal coronary arteries.  Echocardiogram February 2021 EF 60-65% moderate, LVH, G1 DD.  Previously started on Cardizem for  palpitations which has been titrated up in the past.  Past Medical History:  Diagnosis Date  . Acute bronchitis, unspecified   . Arthritis    right knee  . Cancer ([place])    cervical 1989  . Chest pain    a. normal cors by cath in 11/2014  . Chronic diastolic (congestive) heart failure ([place])   . Diabetes mellitus without complication ([place])   . Enlarged heart   . High cholesterol   . Hyperlipidemia, unspecified   . Hypertension   . Leaky heart valve   . Morbid (severe) obesity due to excess calories ([place])   . Osteoarthritis of knee, unspecified   . Palpitations   . Raynaud's syndrome   . Somnolence   . SVT (supraventricular tachycardia) ([facility])    a. s/p ablation by Dr. Lovena Le in 2006.  . Tachycardia   . Type 2 diabetes mellitus with hyperglycemia ([place])   . Unspecified asthma, uncomplicated     Past Surgical History:  Procedure Laterality Date  . CARDIAC CATHETERIZATION    . CARDIAC CATHETERIZATION N/A 12/14/2014   Procedure: Right/Left Heart Cath and Coronary Angiography;  Surgeon: Sherren Mocha, MD;  Location: [facility];  Service: Cardiovascular;  Laterality: N/A;  . CARDIAC ELECTROPHYSIOLOGY STUDY AND ABLATION    . CESAREAN SECTION     x2  . ENDOMETRIAL ABLATION      Current Outpatient Medications  Medication Sig Dispense Refill  . albuterol (VENTOLIN HFA) 108 (90 Base) MCG/ACT inhaler Inhale 1-2 puffs into the lungs every 6 (six) hours as needed for wheezing or  shortness of breath. 18 g 0  . cetirizine (ZYRTEC ALLERGY) 10 MG tablet Take 1 tablet (10 mg total) by mouth daily. (Patient taking differently: Take 10 mg by mouth daily as needed.) 30 tablet 0  . desvenlafaxine (PRISTIQ) 100 MG 24 hr tablet Take 100 mg by mouth daily.    . Dulaglutide (TRULICITY) 1.5 SA/6.3KZ SOPN Inject 1.5 mg into the skin every Sunday.     Marland Kitchen FARXIGA 10 MG TABS tablet Take 10 mg by mouth daily.    . furosemide (LASIX) 20 MG tablet Take 1 tablet (20 mg total) by mouth daily as needed  for fluid. 30 tablet 3  . ibuprofen (ADVIL) 800 MG tablet Take 800 mg by mouth every 8 (eight) hours as needed.    . insulin glargine (LANTUS) 100 UNIT/ML injection Inject 30 Units into the skin 2 (two) times daily.    . irbesartan (AVAPRO) 300 MG tablet Take 300 mg by mouth daily.    . meloxicam (MOBIC) 15 MG tablet Take 15 mg by mouth daily.    . metFORMIN (GLUCOPHAGE) 500 MG tablet Take 1,000 mg by mouth 2 (two) times daily with a meal.    . metoprolol tartrate (LOPRESSOR) 50 MG tablet Take 50 mg by mouth 2 (two) times daily.    . pregabalin (LYRICA) 100 MG capsule Take 100 mg by mouth 3 (three) times daily.    . ranolazine (RANEXA) 500 MG 12 hr tablet Take 500 mg by mouth 2 (two) times daily.    . rosuvastatin (CRESTOR) 40 MG tablet Take 40 mg by mouth daily.    Marland Kitchen diltiazem (CARDIZEM) 90 MG tablet Take 1 tablet (90 mg total) by mouth 2 (two) times daily. 60 tablet 6   No current facility-administered medications for this visit.   Allergies:  Patient has no known allergies.   Social History: The patient  reports that she has never smoked. She has never used smokeless tobacco. She reports current alcohol use. She reports that she does not use drugs.   Family History: The patient's family history includes Cancer in her father and mother; Hypertension in her brother; Stroke in her brother.   ROS:  Please see the history of present illness. Otherwise, complete review of systems is positive for none.  All other systems are reviewed and negative.   Physical Exam: VS:  BP 140/86   Pulse 75   Ht 5\' 4"  (1.626 m)   Wt 267 lb 12.8 oz (121.5 kg)   SpO2 96%   BMI 45.97 kg/m , BMI Body mass index is 45.97 kg/m.  Wt Readings from Last 3 Encounters:  07/30/20 267 lb 12.8 oz (121.5 kg)  05/19/20 265 lb (120.2 kg)  02/26/20 277 lb 3.2 oz (125.7 kg)    General: Morbidly obese patient appears comfortable at rest. Neck: Supple, no elevated JVP or carotid bruits, no thyromegaly. Lungs: Clear to  auscultation, nonlabored breathing at rest. Cardiac: Regular rate and rhythm, no S3 or significant systolic murmur, no pericardial rub. Extremities: No pitting edema, distal pulses 2+. Skin: Warm and dry. Musculoskeletal: No kyphosis. Neuropsychiatric: Alert and oriented x3, affect grossly appropriate.  ECG:  An ECG dated 07/30/2020 was personally reviewed today and demonstrated:  Normal sinus rhythm rate of 81, left axis deviation, pulmonary disease pattern, septal infarct, age undetermined  Recent Labwork: No results found for requested labs within last 8760 hours.     Component Value Date/Time   CHOL 229 (H) 08/07/2019 0814   TRIG 163 (H) 08/07/2019  5093   HDL 47 (L) 08/07/2019 0814   CHOLHDL 4.9 08/07/2019 0814   LDLCALC 152 (H) 08/07/2019 0814    Other Studies Reviewed Today:  Nuclear stress test 03/02/2020 Narrative & Impression   There was no ST segment deviation noted during stress.  The study is normal. There are no perfusion defects consistent with prior infarct or current ischemia.  This is a low risk study.  The left ventricular ejection fraction is normal (55-65%).      Vascular lower extremity arterial duplex/ABI 04/20/2020 Right: Resting right ankle-brachial index is within normal range. No evidence of significant right lower extremity arterial disease. The right toe-brachial index is normal. Left: Resting left ankle-brachial index is within normal range. No evidence of significant left lower extremity arterial disease. The left toe-brachial index is normal.  Lower venous study 12/29/2019 left leg IMPRESSION: No evidence of deep venous thrombosis   Cardiac monitor 09/23/2019 Study Highlights Sinus rhythm and sinus tachycardia with rare PACs and a very brief atrial run. There were no sustained arrhythmias.   Cardiac catheterization on 7/18/2016demonstrated angiographically normal coronary arteries and normal intracardiac  pressures.   Echocardiogram 07/21/2019:  1. Left ventricular ejection fraction, by estimation, is 60 to 65%. The  left ventricle has normal function. The left ventricle has no regional  wall motion abnormalities. There is moderate concentric left ventricular  hypertrophy. Left ventricular  diastolic parameters are consistent with Grade I diastolic dysfunction  (impaired relaxation).  2. Right ventricular systolic function is normal. The right ventricular  size is mildly enlarged.  3. Left atrial size was mildly dilated.  4. The mitral valve is grossly normal. No evidence of mitral valve  regurgitation.  5. The aortic valve is grossly normal. Aortic valve regurgitation is not  visualized.  6. The inferior vena cava is normal in size with greater than 50%  respiratory variability, suggesting right atrial pressure of 3 mmHg.  Assessment and Plan:  1. Shortness of breath   2. Racing heart beat   3. Bilateral arm weakness   4. Essential hypertension, benign   5. Bilateral leg edema   6. Morbid obesity, unspecified obesity type ([place])   7. Suspected sleep apnea    1. Shortness of breath Recently complained of waking up short of breath with racing heart, dizziness.  Has a history of palpitations.  This likely could be related to untreated OSA, obesity hypoventilation syndrome.  She has an upcoming appointment with Dr. Halford Chessman pulmonology for evaluation for sleep apnea  2. Racing heart beat Recent episode of waking up with sudden shortness of breath, palpitations, dizziness.  Also complained of some chest tightness associated.  States she took a Ranexa which resolved the racing heart and chest tightness.  Increase diltiazem to 90 mg p.o. twice daily for palpitations.  3. Bilateral arm weakness Patient complained of bilateral arm weakness after recent episode of racing heart and shortness of breath mentioned above.  States she woke up with sudden shortness of breath, palpitations,  dizziness and felt weak in her arms and shoulders.  4. Essential hypertension, benign Blood pressure today 140/86.  Continue irbesartan 300 mg daily.  Continue metoprolol 50 mg p.o. twice daily.  We are increasing Cardizem to 90 mg p.o. twice daily for palpitations.  Hopefully this will help with blood pressure also.  5. Bilateral leg edema Has chronic bilateral lower extremity edema.  She has as needed furosemide 20 mg.  Continue furosemide as needed.  6. Morbid obesity, unspecified obesity type ([place]) BMI  of 45.97.  Advised weight loss, dietary modifications.  7. Suspected sleep apnea History of snoring, hypoapnea, excessive daytime hypersomnolence.  She has an upcoming visit with Dr. Halford Chessman March 30 for evaluation of sleep apnea.  Medication Adjustments/Labs and Tests Ordered: Current medicines are reviewed at length with the patient today.  Concerns regarding medicines are outlined above.   Disposition: Follow-up with Dr. Harl Bowie or APP 1 month  Signed, [name], NP 07/30/2020 12:59 PM    [facility] [address], [place], [place] [address] Phone: [phone]; Fax: [phone]

## 2020-07-30 ENCOUNTER — Ambulatory Visit (INDEPENDENT_AMBULATORY_CARE_PROVIDER_SITE_OTHER): Payer: Medicaid Other | Admitting: Family Medicine

## 2020-07-30 ENCOUNTER — Encounter: Payer: Self-pay | Admitting: Family Medicine

## 2020-07-30 VITALS — BP 140/86 | HR 75 | Ht 64.0 in | Wt 267.8 lb

## 2020-07-30 DIAGNOSIS — R29898 Other symptoms and signs involving the musculoskeletal system: Secondary | ICD-10-CM

## 2020-07-30 DIAGNOSIS — R600 Localized edema: Secondary | ICD-10-CM

## 2020-07-30 DIAGNOSIS — R0602 Shortness of breath: Secondary | ICD-10-CM

## 2020-07-30 DIAGNOSIS — I10 Essential (primary) hypertension: Secondary | ICD-10-CM | POA: Diagnosis not present

## 2020-07-30 DIAGNOSIS — R0600 Dyspnea, unspecified: Secondary | ICD-10-CM

## 2020-07-30 DIAGNOSIS — R000 Tachycardia, unspecified: Secondary | ICD-10-CM

## 2020-07-30 DIAGNOSIS — R29818 Other symptoms and signs involving the nervous system: Secondary | ICD-10-CM

## 2020-07-30 MED ORDER — DILTIAZEM HCL 90 MG PO TABS
90.0000 mg | ORAL_TABLET | Freq: Two times a day (BID) | ORAL | 6 refills | Status: DC
Start: 2020-07-30 — End: 2020-10-22

## 2020-07-30 NOTE — Patient Instructions (Addendum)
Medication Instructions:   Increase Cardizem to 90mg  twice a day.  Continue all other medications.    Labwork: none  Testing/Procedures: none  Follow-Up: 1 month   Any Other Special Instructions Will Be Listed Below (If Applicable).  If you need a refill on your cardiac medications before your next appointment, please call your pharmacy.

## 2020-08-24 ENCOUNTER — Ambulatory Visit (INDEPENDENT_AMBULATORY_CARE_PROVIDER_SITE_OTHER): Payer: Medicaid Other | Admitting: Podiatry

## 2020-08-24 ENCOUNTER — Other Ambulatory Visit: Payer: Self-pay

## 2020-08-24 DIAGNOSIS — IMO0002 Reserved for concepts with insufficient information to code with codable children: Secondary | ICD-10-CM

## 2020-08-24 DIAGNOSIS — M79675 Pain in left toe(s): Secondary | ICD-10-CM

## 2020-08-24 DIAGNOSIS — L600 Ingrowing nail: Secondary | ICD-10-CM | POA: Diagnosis not present

## 2020-08-24 DIAGNOSIS — B351 Tinea unguium: Secondary | ICD-10-CM

## 2020-08-24 DIAGNOSIS — M79674 Pain in right toe(s): Secondary | ICD-10-CM

## 2020-08-24 DIAGNOSIS — E1149 Type 2 diabetes mellitus with other diabetic neurological complication: Secondary | ICD-10-CM

## 2020-08-24 DIAGNOSIS — Z872 Personal history of diseases of the skin and subcutaneous tissue: Secondary | ICD-10-CM

## 2020-08-24 MED ORDER — MUPIROCIN 2 % EX OINT: 1.0000 "application " | TOPICAL_OINTMENT | Freq: Two times a day (BID) | CUTANEOUS | 2 refills | Status: DC

## 2020-08-24 NOTE — Patient Instructions (Signed)
Diabetes Mellitus and Foot Care Foot care is an important part of your health, especially when you have diabetes. Diabetes may cause you to have problems because of poor blood flow (circulation) to your feet and legs, which can cause your skin to:  Become thinner and drier.  Break more easily.  Heal more slowly.  Peel and crack. You may also have nerve damage (neuropathy) in your legs and feet, causing decreased feeling in them. This means that you may not notice minor injuries to your feet that could lead to more serious problems. Noticing and addressing any potential problems early is the best way to prevent future foot problems. How to care for your feet Foot hygiene  Wash your feet daily with warm water and mild soap. Do not use hot water. Then, pat your feet and the areas between your toes until they are completely dry. Do not soak your feet as this can dry your skin.  Trim your toenails straight across. Do not dig under them or around the cuticle. File the edges of your nails with an emery board or nail file.  Apply a moisturizing lotion or petroleum jelly to the skin on your feet and to dry, brittle toenails. Use lotion that does not contain alcohol and is unscented. Do not apply lotion between your toes.   Shoes and socks  Wear clean socks or stockings every day. Make sure they are not too tight. Do not wear knee-high stockings since they may decrease blood flow to your legs.  Wear shoes that fit properly and have enough cushioning. Always look in your shoes before you put them on to be sure there are no objects inside.  To break in new shoes, wear them for just a few hours a day. This prevents injuries on your feet. Wounds, scrapes, corns, and calluses  Check your feet daily for blisters, cuts, bruises, sores, and redness. If you cannot see the bottom of your feet, use a mirror or ask someone for help.  Do not cut corns or calluses or try to remove them with medicine.  If you  find a minor scrape, cut, or break in the skin on your feet, keep it and the skin around it clean and dry. You may clean these areas with mild soap and water. Do not clean the area with peroxide, alcohol, or iodine.  If you have a wound, scrape, corn, or callus on your foot, look at it several times a day to make sure it is healing and not infected. Check for: ? Redness, swelling, or pain. ? Fluid or blood. ? Warmth. ? Pus or a bad smell.   General tips  Do not cross your legs. This may decrease blood flow to your feet.  Do not use heating pads or hot water bottles on your feet. They may burn your skin. If you have lost feeling in your feet or legs, you may not know this is happening until it is too late.  Protect your feet from hot and cold by wearing shoes, such as at the beach or on hot pavement.  Schedule a complete foot exam at least once a year (annually) or more often if you have foot problems. Report any cuts, sores, or bruises to your health care provider immediately. Where to find more information  American Diabetes Association: www.diabetes.org  Association of Diabetes Care & Education Specialists: www.diabeteseducator.org Contact a health care provider if:  You have a medical condition that increases your risk of infection and   you have any cuts, sores, or bruises on your feet.  You have an injury that is not healing.  You have redness on your legs or feet.  You feel burning or tingling in your legs or feet.  You have pain or cramps in your legs and feet.  Your legs or feet are numb.  Your feet always feel cold.  You have pain around any toenails. Get help right away if:  You have a wound, scrape, corn, or callus on your foot and: ? You have pain, swelling, or redness that gets worse. ? You have fluid or blood coming from the wound, scrape, corn, or callus. ? Your wound, scrape, corn, or callus feels warm to the touch. ? You have pus or a bad smell coming from  the wound, scrape, corn, or callus. ? You have a fever. ? You have a red line going up your leg. Summary  Check your feet every day for blisters, cuts, bruises, sores, and redness.  Apply a moisturizing lotion or petroleum jelly to the skin on your feet and to dry, brittle toenails.  Wear shoes that fit properly and have enough cushioning.  If you have foot problems, report any cuts, sores, or bruises to your health care provider immediately.  Schedule a complete foot exam at least once a year (annually) or more often if you have foot problems. This information is not intended to replace advice given to you by your health care provider. Make sure you discuss any questions you have with your health care provider. Document Revised: 12/04/2019 Document Reviewed: 12/04/2019 Elsevier Patient Education  2021 Elsevier Inc.  

## 2020-08-24 NOTE — Progress Notes (Signed)
Subjective:   Patient ID: Christina Cameron, female   DOB: 55 y.o.   MRN: [id number]   HPI 55 year old female presents the office today with concerns of toenail issues.  She states that in December she had infections around both of her big toenails.  Recently last month she got infection of the right second toenail.  She was on antibiotics for this and since then the toenail has come off.  She currently denies any drainage or pus or any swelling.  She is diabetic and she states that her A1c is in the 10's.  Her blood sugars typically run in the 200-300 range.  Denies any open lesions.  She does get neuropathy symptoms, numbness and tingling to her feet.  She is currently on Lyrica but not sure of the dose.  She has no ulcerations.  She has no other concerns.   Review of Systems  All other systems reviewed and are negative.  Past Medical History:  Diagnosis Date  . Arthritis    right knee  . Asthma   . Cancer ([place])    cervical 1989  . Chest pain    a. normal cors by cath in 11/2014  . Chronic diastolic (congestive) heart failure ([place])   . COVID-17 June 2019, January 2022  . Diabetes mellitus, type 2 ([place])   . High cholesterol   . Hypertension   . Morbid (severe) obesity due to excess calories ([place])   . Osteoarthritis of knee, unspecified   . Palpitations   . Raynaud's syndrome   . SVT (supraventricular tachycardia) ([facility])    a. s/p ablation by Dr. Lovena Le in 2006.    Past Surgical History:  Procedure Laterality Date  . CARDIAC CATHETERIZATION    . CARDIAC CATHETERIZATION N/A 12/14/2014   Procedure: Right/Left Heart Cath and Coronary Angiography;  Surgeon: Sherren Mocha, MD;  Location: [facility];  Service: Cardiovascular;  Laterality: N/A;  . CARDIAC ELECTROPHYSIOLOGY STUDY AND ABLATION    . CESAREAN SECTION     x2  . ENDOMETRIAL ABLATION       Current Outpatient Medications:  .  mupirocin ointment (BACTROBAN) 2 %, Apply 1 application topically 2 (two) times daily.,  Disp: 30 g, Rfl: 2 .  albuterol (VENTOLIN HFA) 108 (90 Base) MCG/ACT inhaler, Inhale 1-2 puffs into the lungs every 6 (six) hours as needed for wheezing or shortness of breath., Disp: 18 g, Rfl: 0 .  ARIPiprazole (ABILIFY) 2 MG tablet, Take 2 mg by mouth daily., Disp: , Rfl:  .  cetirizine (ZYRTEC ALLERGY) 10 MG tablet, Take 1 tablet (10 mg total) by mouth daily. (Patient taking differently: Take 10 mg by mouth daily as needed.), Disp: 30 tablet, Rfl: 0 .  desvenlafaxine (PRISTIQ) 100 MG 24 hr tablet, Take 100 mg by mouth daily., Disp: , Rfl:  .  diltiazem (CARDIZEM) 90 MG tablet, Take 1 tablet (90 mg total) by mouth 2 (two) times daily., Disp: 60 tablet, Rfl: 6 .  doxycycline (VIBRA-TABS) 100 MG tablet, Take 100 mg by mouth 2 (two) times daily., Disp: , Rfl:  .  Dulaglutide (TRULICITY) 1.5 AY/3.0ZS SOPN, Inject 1.5 mg into the skin every Sunday. , Disp: , Rfl:  .  FARXIGA 10 MG TABS tablet, Take 10 mg by mouth daily., Disp: , Rfl:  .  furosemide (LASIX) 20 MG tablet, Take 1 tablet (20 mg total) by mouth daily as needed for fluid., Disp: 30 tablet, Rfl: 3 .  ibuprofen (ADVIL) 800 MG tablet, Take 800 mg  by mouth every 8 (eight) hours as needed., Disp: , Rfl:  .  insulin glargine (LANTUS) 100 UNIT/ML injection, Inject 30 Units into the skin 2 (two) times daily., Disp: , Rfl:  .  irbesartan (AVAPRO) 300 MG tablet, Take 300 mg by mouth daily., Disp: , Rfl:  .  meloxicam (MOBIC) 15 MG tablet, Take 15 mg by mouth daily., Disp: , Rfl:  .  metFORMIN (GLUCOPHAGE) 500 MG tablet, Take 1,000 mg by mouth 2 (two) times daily with a meal., Disp: , Rfl:  .  metoprolol tartrate (LOPRESSOR) 50 MG tablet, Take 50 mg by mouth 2 (two) times daily., Disp: , Rfl:  .  omeprazole (PRILOSEC) 40 MG capsule, Take 40 mg by mouth daily., Disp: , Rfl:  .  pregabalin (LYRICA) 100 MG capsule, Take 100 mg by mouth 3 (three) times daily., Disp: , Rfl:  .  ranolazine (RANEXA) 500 MG 12 hr tablet, Take 500 mg by mouth 2 (two) times  daily., Disp: , Rfl:  .  rosuvastatin (CRESTOR) 40 MG tablet, Take 40 mg by mouth daily., Disp: , Rfl:  .  valACYclovir (VALTREX) 1000 MG tablet, valacyclovir 1 gram tablet  TAKE 2 TABLETS BY MOUTH TWICE DAILY FOR 1 DAY, Disp: , Rfl:   No Known Allergies       Objective:  Physical Exam  General: AAO x3, NAD  Dermatological: Nails in general are dystrophic, brittle, discolored with yellow discoloration.  There is incurvation present of the nails particularly hallux nails.  No nails present on the right second toe.  On the right second toe there is minimal edema and erythema at the base of the nail plate no drainage or pus.  This is likely for the nail just came off as well as some resolving infection.  There is no ascending cellulitis.  There is no fluctuance or crepitation.  No edema, erythema to the other toenails.  No open lesions bilaterally.  Vascular: Dorsalis Pedis artery and Posterior Tibial artery pedal pulses are 2/4 bilateral with immedate capillary fill time.There is no pain with calf compression, swelling, warmth, erythema.   Neruologic: Sensation decreased with Semmes Weinstein monofilament.  Musculoskeletal:No pain, crepitus, or limitation noted with foot and ankle range of motion bilateral. Muscular strength 5/5 in all groups tested bilateral.      Assessment:   55 year old female with ingrown toenail, with history of infection, symptomatic onychomycosis     Plan:  -Treatment options discussed including all alternatives, risks, and complications -Etiology of symptoms were discussed -Long discussion regards to nail care.  Today we discussed nail removal, partial nail removal but there is no signs of infection, pain currently to the hallux toenails.  Also given uncontrolled diabetes would recommend holding off on the procedure at this time.  The nail of the right second toe has come off on its own.  I did prescribe you mupirocin ointment.  Wash with soap and water daily  and dry thoroughly.  I did debride the nails x9 without any complications or bleeding. -Due to the neuropathy she is on Lyrica.  Discussed possibly increasing the dose.  I do not have any recent lab work.  We will send this note over to her primary care physician as well to see about increasing the Lyrica.  Trula Slade DPM

## 2020-08-25 ENCOUNTER — Ambulatory Visit (INDEPENDENT_AMBULATORY_CARE_PROVIDER_SITE_OTHER): Payer: Medicaid Other | Admitting: Pulmonary Disease

## 2020-08-25 ENCOUNTER — Encounter: Payer: Self-pay | Admitting: Pulmonary Disease

## 2020-08-25 VITALS — BP 134/80 | HR 79 | Temp 97.1°F | Ht 64.0 in | Wt 272.6 lb

## 2020-08-25 DIAGNOSIS — R0683 Snoring: Secondary | ICD-10-CM

## 2020-08-25 NOTE — Patient Instructions (Signed)
Will arrange for home sleep study Will call to arrange for follow up after sleep study reviewed  

## 2020-08-25 NOTE — Progress Notes (Signed)
Pulmonary, Critical Care, and Sleep Medicine  Chief Complaint  Patient presents with  . Follow-up    Needing sleep doctor    Constitutional:  BP 134/80 (BP Location: Left Arm, Cuff Size: Normal)   Pulse 79   Temp (!) 97.1 F (36.2 C) (Other (Comment)) Comment (Src): wrist  Ht 5\' 4"  (1.626 m)   Wt 272 lb 9.6 oz (123.7 kg)   SpO2 99% Comment: Room air  BMI 46.79 kg/m   Past Medical History:  OA, Cervical cancer, Diastolic CHF, DM, HLD, HTN, Raynaud's, SVT s/p ablation, COVID 19 in January 2021 and January 2022  Past Surgical History:  She  has a past surgical history that includes Cardiac catheterization; Cardiac electrophysiology study and ablation; Endometrial ablation; Cardiac catheterization (N/A, 12/14/2014); and Cesarean section.  Brief Summary:  Christina Cameron is a 55 y.o. female with snoring.      Subjective:   She was seen by Dr. Melvyn Novas in April 2021.  At the time concern was for upper airway cough syndrome versus cough variant asthma.  Also concern ACE inhibitor cough.  She had COVID 19 infection in December 2020 and much of her cough symptoms occurred after this.  She was more recently seen by cardiology palpitations and there was concern she could have sleep apnea.  Her daughter had severe sleep apnea and died in her sleep after getting a respiratory infection.  She snores and wakes up hearing herself snore.  She is a restless sleeper.  She will wake up feeling her heart race.  She is tired all the time, and can fall asleep whenever she is sitting.  She goes to sleep between 1 to 3 am.  She falls asleep in few minutes.  She wakes up 4 to 5 times to use the bathroom.  She gets out of bed at 7 am.  She feels tired in the morning.  She gets frequent migraine headaches.  She does not use anything to help her fall sleep or stay awake.  She will talk in her sleep.  She denies sleep walking, bruxism, or nightmares.  There is no history of restless legs.  She denies  sleep hallucinations, sleep paralysis, or cataplexy.  The Epworth score is 20 out of 24.  Physical Exam:   Appearance - well kempt   ENMT - no sinus tenderness, no oral exudate, no LAN, Mallampati 4 airway, no stridor  Respiratory - equal breath sounds bilaterally, no wheezing or rales  CV - s1s2 regular rate and rhythm, no murmurs  Ext - no clubbing, no edema  Skin - no rashes  Psych - normal mood and affect   Pulmonary testing:   PFT 06/02/16 >> FEV1 2.08 (72%), FEV1% 79, TLC 4.31 (82%), DLCO 74%  Chest Imaging:   CT chest 09/28/14 >> lungs clear, fatty liver  Sleep Tests:    Cardiac Tests:   Echo 07/21/19 >> EF 60 to 65%, mod LVH, grade 1 DD  Social History:  She  reports that she has never smoked. She has never used smokeless tobacco. She reports current alcohol use. She reports that she does not use drugs.  Family History:  Her family history includes Cancer in her father and mother; Hypertension in her brother; Stroke in her brother.    Discussion:  She has snoring, sleep disruption, apnea, and daytime sleepiness.  Her BMI is > 35.  She has history of hypertension with diastolic CHF.  I am concerned she has obstructive sleep apnea.  Assessment/Plan:   Snoring with excessive daytime sleepiness. - will need to arrange for a home sleep study  Diastolic CHF, SVT. - followed by Dr. Carlyle Dolly with [facility]  Obesity. - discussed how weight can impact sleep and risk for sleep disordered breathing - discussed options to assist with weight loss: combination of diet modification, cardiovascular and strength training exercises  Cardiovascular risk. - had an extensive discussion regarding the adverse health consequences related to untreated sleep disordered breathing - specifically discussed the risks for hypertension, coronary artery disease, cardiac dysrhythmias, cerebrovascular disease, and diabetes - lifestyle modification discussed  Safe driving  practices. - discussed how sleep disruption can increase risk of accidents, particularly when driving - safe driving practices were discussed  Therapies for obstructive sleep apnea. - if the sleep study shows significant sleep apnea, then various therapies for treatment were reviewed: CPAP, oral appliance, and surgical interventions  Time Spent Involved in Patient Care on Day of Examination:  31 minutes  Follow up:  Patient Instructions  Will arrange for home sleep study Will call to arrange for follow up after sleep study reviewed    Medication List:   Allergies as of 08/25/2020   No Known Allergies     Medication List       Accurate as of August 25, 2020  9:53 AM. If you have any questions, ask your nurse or doctor.        STOP taking these medications   QUEtiapine 100 MG tablet Commonly known as: SEROQUEL Stopped by: Chesley Mires, MD     TAKE these medications   albuterol 108 (90 Base) MCG/ACT inhaler Commonly known as: VENTOLIN HFA Inhale 1-2 puffs into the lungs every 6 (six) hours as needed for wheezing or shortness of breath.   ARIPiprazole 2 MG tablet Commonly known as: ABILIFY Take 2 mg by mouth daily.   cetirizine 10 MG tablet Commonly known as: ZyrTEC Allergy Take 1 tablet (10 mg total) by mouth daily. What changed:   when to take this  reasons to take this   desvenlafaxine 100 MG 24 hr tablet Commonly known as: PRISTIQ Take 100 mg by mouth daily.   diltiazem 90 MG tablet Commonly known as: CARDIZEM Take 1 tablet (90 mg total) by mouth 2 (two) times daily.   doxycycline 100 MG tablet Commonly known as: VIBRA-TABS Take 100 mg by mouth 2 (two) times daily.   Farxiga 10 MG Tabs tablet Generic drug: dapagliflozin propanediol Take 10 mg by mouth daily.   furosemide 20 MG tablet Commonly known as: LASIX Take 1 tablet (20 mg total) by mouth daily as needed for fluid.   ibuprofen 800 MG tablet Commonly known as: ADVIL Take 800 mg by mouth  every 8 (eight) hours as needed.   insulin glargine 100 UNIT/ML injection Commonly known as: LANTUS Inject 30 Units into the skin 2 (two) times daily.   irbesartan 300 MG tablet Commonly known as: AVAPRO Take 300 mg by mouth daily.   meloxicam 15 MG tablet Commonly known as: MOBIC Take 15 mg by mouth daily.   metFORMIN 500 MG tablet Commonly known as: GLUCOPHAGE Take 1,000 mg by mouth 2 (two) times daily with a meal.   metoprolol tartrate 50 MG tablet Commonly known as: LOPRESSOR Take 50 mg by mouth 2 (two) times daily.   mupirocin ointment 2 % Commonly known as: BACTROBAN Apply 1 application topically 2 (two) times daily.   omeprazole 40 MG capsule Commonly known as: PRILOSEC Take 40 mg  by mouth daily.   pregabalin 100 MG capsule Commonly known as: LYRICA Take 100 mg by mouth 3 (three) times daily. What changed: Another medication with the same name was removed. Continue taking this medication, and follow the directions you see here. Changed by: Chesley Mires, MD   ranolazine 500 MG 12 hr tablet Commonly known as: RANEXA Take 500 mg by mouth 2 (two) times daily.   rosuvastatin 40 MG tablet Commonly known as: CRESTOR Take 40 mg by mouth daily.   Trulicity 1.5 HY/8.5OY Sopn Generic drug: Dulaglutide Inject 1.5 mg into the skin every Sunday.   valACYclovir 1000 MG tablet Commonly known as: VALTREX valacyclovir 1 gram tablet  TAKE 2 TABLETS BY MOUTH TWICE DAILY FOR 1 DAY       Signature:  Chesley Mires, MD [place] Pager - [phone] 08/25/2020, 9:53 AM

## 2020-08-28 ENCOUNTER — Telehealth: Payer: Medicaid Other | Admitting: Physician Assistant

## 2020-08-28 DIAGNOSIS — J040 Acute laryngitis: Secondary | ICD-10-CM

## 2020-08-28 DIAGNOSIS — Z20822 Contact with and (suspected) exposure to covid-19: Secondary | ICD-10-CM | POA: Diagnosis not present

## 2020-08-28 DIAGNOSIS — J029 Acute pharyngitis, unspecified: Secondary | ICD-10-CM | POA: Diagnosis not present

## 2020-08-28 DIAGNOSIS — R059 Cough, unspecified: Secondary | ICD-10-CM

## 2020-08-28 MED ORDER — BENZONATATE 100 MG PO CAPS: 100.0000 mg | ORAL_CAPSULE | Freq: Three times a day (TID) | ORAL | 0 refills | Status: DC | PRN

## 2020-08-28 MED ORDER — AMOXICILLIN 500 MG PO CAPS: 500.0000 mg | ORAL_CAPSULE | Freq: Two times a day (BID) | ORAL | 0 refills | Status: DC

## 2020-08-28 NOTE — Progress Notes (Signed)
E-Visit for Corona Virus Screening  Your current symptoms could be consistent with the coronavirus.  Many health care providers can now test patients at their office but not all are.  [facility] has multiple testing sites. For information on our [place] testing locations and hours go to [email or web address]  We are enrolling you in our [facility] for [place] . Daily you will receive a questionnaire within the [place] website. Our COVID 19 response team will be monitoring your responses daily.  Testing Information: The COVID-19 Community Testing sites are testing BY APPOINTMENT ONLY.  You can schedule online at [email or web address]  If you do not have access to a smart phone or computer you may call [phone] for an appointment.   Additional testing sites in the Community:  . For [facility] Testing sites in [facility]  [email or web address]  . For Pop-up testing sites in [place]  [email or web address].[email or web address]  . For [facility] [email or web address]  . For [facility] testing in [place] and [employer] [email or web address]  . For [facility] testing in [facility]   [email or web address]  For  more information about community testing call [phone]   Please quarantine yourself while awaiting your test results. Please stay home for a minimum of 10 days from the first day of illness with improving symptoms and you have had 24 hours of no fever (without the use of Tylenol (Acetaminophen) Motrin (Ibuprofen) or any fever reducing medication).  Also - Do not get tested prior to returning to work because once you have had a positive test the test can stay  positive for more than a month in some cases.   You should wear a mask or cloth face covering over your nose and mouth if you must be around other people or animals, including pets (even at home). Try to stay at least 6 feet away from other people. This will protect the people around you.  Please continue good preventive care measures, including:  frequent hand-washing, avoid touching your face, cover coughs/sneezes, stay out of crowds and keep a 6 foot distance from others.  COVID-19 is a respiratory illness with symptoms that are similar to the flu. Symptoms are typically mild to moderate, but there have been cases of severe illness and death due to the virus.   The following symptoms may appear 2-14 days after exposure: . Fever . Cough . Shortness of breath or difficulty breathing . Chills . Repeated shaking with chills . Muscle pain . Headache . Sore throat . New loss of taste or smell . Fatigue . Congestion or runny nose . Nausea or vomiting . Diarrhea  Go to the nearest hospital ED for assessment if fever/cough/breathlessness are severe or illness seems like a threat to life.  It is vitally important that if you feel that you have an infection such as this virus or any other virus that you stay home and away from places where you may spread it to others.  You should avoid contact with people age 22 and older.   You can use medication such as prescription cough medication called Tessalon Perles 100 mg. You may take 1-2 capsules every 8 hours as needed for cough   Your hoarseness of the voice, along with sore throat, I have prescribed antibiotic, Amoxicillin 500 mg one pill twice daily for 7 days.   You may also take acetaminophen (Tylenol) as needed for fever.  Reduce your risk of any infection by using the same precautions used for avoiding the common cold  or flu:  Marland Kitchen Wash your hands often with soap and warm water for at least 20 seconds.  If soap and water are not readily available,  use an alcohol-based hand sanitizer with at least 60% alcohol.  . If coughing or sneezing, cover your mouth and nose by coughing or sneezing into the elbow areas of your shirt or coat, into a tissue or into your sleeve (not your hands). . Avoid shaking hands with others and consider head nods or verbal greetings only. . Avoid touching your eyes, nose, or mouth with unwashed hands.  . Avoid close contact with people who are sick. . Avoid places or events with large numbers of people in one location, like concerts or sporting events. . Carefully consider travel plans you have or are making. . If you are planning any travel outside or inside the [place], visit the CDC's Travelers' Health webpage for the latest health notices. . If you have some symptoms but not all symptoms, continue to monitor at home and seek medical attention if your symptoms worsen. . If you are having a medical emergency, call 911.  HOME CARE . Only take medications as instructed by your medical team. . Drink plenty of fluids and get plenty of rest. . A steam or ultrasonic humidifier can help if you have congestion.   GET HELP RIGHT AWAY IF YOU HAVE EMERGENCY WARNING SIGNS** FOR COVID-19. If you or someone is showing any of these signs seek emergency medical care immediately. Call 911 or proceed to your closest emergency facility if: . You develop worsening high fever. . Trouble breathing . Bluish lips or face . Persistent pain or pressure in the chest . New confusion . Inability to wake or stay awake . You cough up blood. . Your symptoms become more severe  **This list is not all possible symptoms. Contact your medical provider for any symptoms that are sever or concerning to you.  MAKE SURE YOU   Understand these instructions.  Will watch your condition.  Will get help right away if you are not doing well or get worse.  Your e-visit answers were reviewed by a board certified advanced clinical practitioner to  complete your personal care plan.  Depending on the condition, your plan could have included both over the counter or prescription medications.  If there is a problem please reply once you have received a response from your provider.  Your safety is important to [place].  If you have drug allergies check your prescription carefully.    You can use MyChart to ask questions about today's visit, request a non-urgent call back, or ask for a work or school excuse for 24 hours related to this e-Visit. If it has been greater than 24 hours you will need to follow up with your provider, or enter a new e-Visit to address those concerns. You will get an e-mail in the next two days asking about your experience.  I hope that your e-visit has been valuable and will speed your recovery. Thank you for using e-visits.   I spent 5-10 minutes on review and completion of this note- Lacy Duverney [facility]

## 2020-09-03 ENCOUNTER — Ambulatory Visit: Payer: Medicaid Other | Admitting: Family Medicine

## 2020-09-09 NOTE — Progress Notes (Signed)
Cardiology Office Note  Date: 09/10/2020   ID: Christina, Cameron 1965/08/27, MRN [id number]  PCP:  Bonnita Hollow, MD  Cardiologist:  No primary care provider on file. Electrophysiologist:  None   Chief Complaint: Shortness of breath  History of Present Illness: Christina Cameron is a 55 y.o. female with a history of  palpitations/previous SVT ablation 2005, chest pain, HTN, HLD, bilateral leg edema, morbid obesity.  Previous patient of  Dr. Bronson Ing. Previous diagnosis of COVID-19 virus 05/29/2019. Previously experiencing chest tightness, shortness of breath, palpitations, dizziness.  Started Ranexa 500 mg p.o. twice daily. Blood pressure previously markedly elevated. She was started on lisinopril 10 mg. Lipids markedly elevated in the past. Crestor previously increased to 40 mg daily. She was continuing Lasix as needed for lower extremity edema.  She was here last visit for complaint of waking up short of breath with chest tightness, racing heart, and dizziness.  She states she took her Ranexa which eventually resolved her chest tightness.  She stated her blood sugar had been elevated recently with most recent random blood sugar  221.  Blood pressure  elevated at 140/86 with a heart rate of 75.  Currently taking irbesartan 300 mg daily.  Furosemide 20 mg daily as needed for fluid.  Metoprolol 50 mg p.o. twice daily..  She has an upcoming appointment with Dr. Halford Chessman for evaluation for sleep apnea.  She is dealing with some significant stress due to the death of her daughter in late June 06, 2023 of this past year. In October 2021 she had a low risk stress test.  Previous cardiac monitor in April 2021 demonstrating sinus rhythm and sinus tachycardia with rare PACs and a brief atrial run.  No sustained arrhythmias.  Previous cardiac catheterization 2016 normal coronary arteries.  Echocardiogram February 2021 EF 60-65% moderate, LVH, G1 DD.  Previously started on Cardizem for palpitations which had been  titrated up in the past.  She saw Dr. Halford Chessman recently on 08/25/2020 and he ordered a home sleep study due to suspected sleep apnea.  She presents today stating her palpitations are a little better but they still occur.  She continues with some shortness of breath.  Her last echocardiogram in February 2021 showed EF 60 to 65%.  moderate concentric LVH.  G1 DD.  She states when she walks she becomes more short of breath and seems to significantly speed up her heart rate.  She also states she is still dealing with the death of her daughter last year and is going to see a psychologist and is starting with a grief coping group at church.  She states Dr. Juanetta Gosling office has not contacted her regarding getting her sleep study.  Blood pressure appears reasonably controlled today.  BP today 130/82.  Still having some issues with controlling her blood sugars.  She states she needs knee surgery but surgeon stated she needed better control over her diabetes before surgery could be done.    Past Medical History:  Diagnosis Date  . Arthritis    right knee  . Asthma   . Cancer ([place])    cervical 1989  . Chest pain    a. normal cors by cath in 11/2014  . Chronic diastolic (congestive) heart failure ([place])   . COVID-17 June 2019, January 2022  . Diabetes mellitus, type 2 ([place])   . High cholesterol   . Hypertension   . Morbid (severe) obesity due to excess calories ([place])   . Osteoarthritis of knee,  unspecified   . Palpitations   . Raynaud's syndrome   . SVT (supraventricular tachycardia) ([facility])    a. s/p ablation by Dr. Lovena Le in 2006.    Past Surgical History:  Procedure Laterality Date  . CARDIAC CATHETERIZATION    . CARDIAC CATHETERIZATION N/A 12/14/2014   Procedure: Right/Left Heart Cath and Coronary Angiography;  Surgeon: Sherren Mocha, MD;  Location: [facility];  Service: Cardiovascular;  Laterality: N/A;  . CARDIAC ELECTROPHYSIOLOGY STUDY AND ABLATION    . CESAREAN SECTION     x2  .  ENDOMETRIAL ABLATION      Current Outpatient Medications  Medication Sig Dispense Refill  . albuterol (VENTOLIN HFA) 108 (90 Base) MCG/ACT inhaler Inhale 1-2 puffs into the lungs every 6 (six) hours as needed for wheezing or shortness of breath. 18 g 0  . ARIPiprazole (ABILIFY) 10 MG tablet Take 10 mg by mouth daily.    . cetirizine (ZYRTEC) 10 MG tablet Take 10 mg by mouth as needed for allergies.    Marland Kitchen desvenlafaxine (PRISTIQ) 100 MG 24 hr tablet Take 100 mg by mouth daily.    Marland Kitchen diltiazem (CARDIZEM) 90 MG tablet Take 1 tablet (90 mg total) by mouth 2 (two) times daily. 60 tablet 6  . doxycycline (VIBRA-TABS) 100 MG tablet Take 100 mg by mouth 2 (two) times daily.    . Dulaglutide (TRULICITY) 1.5 TK/1.6WF SOPN Inject 1.5 mg into the skin every Sunday.     Marland Kitchen FARXIGA 10 MG TABS tablet Take 10 mg by mouth daily.    . furosemide (LASIX) 20 MG tablet Take 1 tablet (20 mg total) by mouth daily as needed for fluid. 30 tablet 3  . ibuprofen (ADVIL) 800 MG tablet Take 800 mg by mouth every 8 (eight) hours as needed.    . insulin glargine (LANTUS) 100 UNIT/ML injection Inject 30 Units into the skin 2 (two) times daily.    . irbesartan (AVAPRO) 300 MG tablet Take 300 mg by mouth daily.    . meloxicam (MOBIC) 15 MG tablet Take 15 mg by mouth daily.    . metFORMIN (GLUCOPHAGE) 500 MG tablet Take 1,000 mg by mouth 2 (two) times daily with a meal.    . metoprolol tartrate (LOPRESSOR) 50 MG tablet Take 50 mg by mouth 2 (two) times daily.    . mupirocin ointment (BACTROBAN) 2 % Apply 1 application topically 2 (two) times daily. 30 g 2  . omeprazole (PRILOSEC) 40 MG capsule Take 40 mg by mouth daily.    . pregabalin (LYRICA) 100 MG capsule Take 100 mg by mouth 3 (three) times daily.    . ranolazine (RANEXA) 500 MG 12 hr tablet Take 500 mg by mouth 2 (two) times daily.    . rosuvastatin (CRESTOR) 40 MG tablet Take 40 mg by mouth daily.     No current facility-administered medications for this visit.    Allergies:  Patient has no known allergies.   Social History: The patient  reports that she has never smoked. She has never used smokeless tobacco. She reports current alcohol use. She reports that she does not use drugs.   Family History: The patient's family history includes Cancer in her father and mother; Hypertension in her brother; Stroke in her brother.   ROS:  Please see the history of present illness. Otherwise, complete review of systems is positive for none.  All other systems are reviewed and negative.   Physical Exam: VS:  BP 130/82   Pulse 74  Ht 5\' 4"  (1.626 m)   Wt 272 lb 6.4 oz (123.6 kg)   SpO2 98%   BMI 46.76 kg/m , BMI Body mass index is 46.76 kg/m.  Wt Readings from Last 3 Encounters:  09/10/20 272 lb 6.4 oz (123.6 kg)  08/25/20 272 lb 9.6 oz (123.7 kg)  07/30/20 267 lb 12.8 oz (121.5 kg)    General: Morbidly obese patient appears comfortable at rest. Neck: Supple, no elevated JVP or carotid bruits, no thyromegaly. Lungs: Clear to auscultation, nonlabored breathing at rest. Cardiac: Regular rate and rhythm, no S3 or significant systolic murmur, no pericardial rub. Extremities: No pitting edema, distal pulses 2+. Skin: Warm and dry. Musculoskeletal: No kyphosis. Neuropsychiatric: Alert and oriented x3, affect grossly appropriate.  ECG:  An ECG dated 07/30/2020 was personally reviewed today and demonstrated:  Normal sinus rhythm rate of 81, left axis deviation, pulmonary disease pattern, septal infarct, age undetermined  Recent Labwork: No results found for requested labs within last 8760 hours.     Component Value Date/Time   CHOL 229 (H) 08/07/2019 0814   TRIG 163 (H) 08/07/2019 0814   HDL 47 (L) 08/07/2019 0814   CHOLHDL 4.9 08/07/2019 0814   LDLCALC 152 (H) 08/07/2019 0814    Other Studies Reviewed Today:  Nuclear stress test 03/02/2020 Narrative & Impression   There was no ST segment deviation noted during stress.  The study is normal.  There are no perfusion defects consistent with prior infarct or current ischemia.  This is a low risk study.  The left ventricular ejection fraction is normal (55-65%).     Vascular lower extremity arterial duplex/ABI 04/20/2020 Right: Resting right ankle-brachial index is within normal range. No evidence of significant right lower extremity arterial disease. The right toe-brachial index is normal. Left: Resting left ankle-brachial index is within normal range. No evidence of significant left lower extremity arterial disease. The left toe-brachial index is normal.  Lower venous study 12/29/2019 left leg IMPRESSION: No evidence of deep venous thrombosis   Cardiac monitor 09/23/2019 Study Highlights Sinus rhythm and sinus tachycardia with rare PACs and a very brief atrial run. There were no sustained arrhythmias.   Cardiac catheterization on 7/18/2016demonstrated angiographically normal coronary arteries and normal intracardiac pressures.   Echocardiogram 07/21/2019:  1. Left ventricular ejection fraction, by estimation, is 60 to 65%. The  left ventricle has normal function. The left ventricle has no regional  wall motion abnormalities. There is moderate concentric left ventricular  hypertrophy. Left ventricular  diastolic parameters are consistent with Grade I diastolic dysfunction  (impaired relaxation).  2. Right ventricular systolic function is normal. The right ventricular  size is mildly enlarged.  3. Left atrial size was mildly dilated.  4. The mitral valve is grossly normal. No evidence of mitral valve  regurgitation.  5. The aortic valve is grossly normal. Aortic valve regurgitation is not  visualized.  6. The inferior vena cava is normal in size with greater than 50%  respiratory variability, suggesting right atrial pressure of 3 mmHg.  Assessment and Plan:  1. Shortness of breath   2. Racing heart beat   3. Bilateral arm weakness   4. Essential  hypertension, benign   5. Bilateral leg edema   6. Morbid obesity, unspecified obesity type ([place])   7. Suspected sleep apnea    1. Shortness of breath Recently complained of waking up short of breath with racing heart, dizziness.  Has a history of palpitations.  This likely could be related to untreated OSA,  obesity hypoventilation syndrome.  She saw Dr. Halford Chessman recently and he has ordered a home sleep study test.  She states they have not contacted her regarding when this is to be done.  We will get nursing staff to call his office to determine when she can have the home sleep study.  Last echo February 2021 showed EF 60 to 65%, moderate concentric LVH and G1 DD.  Please get a follow-up echocardiogram to reassess LV function, diastolic function and valvular function.  2. Racing heart beat Recent episode of waking up with sudden shortness of breath, palpitations, dizziness.  Also complained of some chest tightness associated.  States she took a Ranexa which resolved the racing heart and chest tightness.  At last visit diltiazem was increased to 90 mg p.o. twice daily for palpitations.  She states palpitations are little better.  3. Bilateral arm weakness Patient complained of bilateral arm weakness after recent episode of racing heart and shortness of breath mentioned above.  States she woke up with sudden shortness of breath, palpitations, dizziness and felt weak in her arms and shoulders.  4. Essential hypertension, benign Blood pressure today 130/82.  Continue irbesartan 300 mg daily.  Continue metoprolol 50 mg p.o. twice daily.  Continue Cardizem to 90 mg p.o. twice daily for palpitations.   5. Bilateral leg edema Has chronic bilateral lower extremity edema.  She has as needed furosemide 20 mg.  Continue furosemide as needed.  6. Morbid obesity, unspecified obesity type ([place]) BMI of 45.97.  Advised weight loss, dietary modifications.  7. Suspected sleep apnea History of snoring, hypoapnea,  excessive daytime hypersomnolence.  She recently saw Dr. Halford Chessman who has ordered a sleep study.  She states she has not been contacted regarding sleep study since her office visit.  We will have nursing staff call Dr. Juanetta Gosling office to determine next steps.  Medication Adjustments/Labs and Tests Ordered: Current medicines are reviewed at length with the patient today.  Concerns regarding medicines are outlined above.   Disposition: Follow-up with Dr. Harl Bowie or APP 6 weeks Signed, [name], NP 09/10/2020 8:50 AM    [facility] [place], [place], [place] [address] Phone: [phone]; Fax: [phone]

## 2020-09-10 ENCOUNTER — Other Ambulatory Visit: Payer: Self-pay

## 2020-09-10 ENCOUNTER — Encounter: Payer: Self-pay | Admitting: Family Medicine

## 2020-09-10 ENCOUNTER — Ambulatory Visit (INDEPENDENT_AMBULATORY_CARE_PROVIDER_SITE_OTHER): Payer: Medicaid Other | Admitting: Family Medicine

## 2020-09-10 VITALS — BP 130/82 | HR 74 | Ht 64.0 in | Wt 272.4 lb

## 2020-09-10 DIAGNOSIS — R0602 Shortness of breath: Secondary | ICD-10-CM

## 2020-09-10 DIAGNOSIS — R600 Localized edema: Secondary | ICD-10-CM

## 2020-09-10 DIAGNOSIS — R000 Tachycardia, unspecified: Secondary | ICD-10-CM | POA: Diagnosis not present

## 2020-09-10 DIAGNOSIS — R29818 Other symptoms and signs involving the nervous system: Secondary | ICD-10-CM

## 2020-09-10 DIAGNOSIS — R29898 Other symptoms and signs involving the musculoskeletal system: Secondary | ICD-10-CM

## 2020-09-10 DIAGNOSIS — I10 Essential (primary) hypertension: Secondary | ICD-10-CM | POA: Diagnosis not present

## 2020-09-10 NOTE — Addendum Note (Signed)
Addended by: Laurine Blazer on: 09/10/2020 09:08 AM   Modules accepted: Orders

## 2020-09-10 NOTE — Patient Instructions (Signed)
Medication Instructions:  Continue all current medications.  Labwork: none  Testing/Procedures:  Your physician has requested that you have an echocardiogram. Echocardiography is a painless test that uses sound waves to create images of your heart. It provides your doctor with information about the size and shape of your heart and how well your heart's chambers and valves are working. This procedure takes approximately one hour. There are no restrictions for this procedure.  Office will contact with results via phone or letter.    Follow-Up: 6 weeks   Any Other Special Instructions Will Be Listed Below (If Applicable).  If you need a refill on your cardiac medications before your next appointment, please call your pharmacy.

## 2020-09-15 ENCOUNTER — Telehealth: Payer: Self-pay | Admitting: Cardiology

## 2020-09-15 DIAGNOSIS — Z0279 Encounter for issue of other medical certificate: Secondary | ICD-10-CM

## 2020-09-15 NOTE — Telephone Encounter (Signed)
Forms from [employer] received on 4/14/221. Completed patient auth attached. Took form to [employer] for completion. [facility] 09/15/20 called patient and let her know the form charge , she will be by next Thursday to fill out paper work and pay fee.

## 2020-09-23 ENCOUNTER — Other Ambulatory Visit: Payer: Self-pay

## 2020-09-23 ENCOUNTER — Ambulatory Visit: Payer: Medicaid Other

## 2020-09-23 DIAGNOSIS — G4733 Obstructive sleep apnea (adult) (pediatric): Secondary | ICD-10-CM

## 2020-09-23 DIAGNOSIS — R0683 Snoring: Secondary | ICD-10-CM

## 2020-09-24 DIAGNOSIS — G4730 Sleep apnea, unspecified: Secondary | ICD-10-CM

## 2020-09-24 HISTORY — DX: Sleep apnea, unspecified: G47.30

## 2020-09-27 ENCOUNTER — Telehealth: Payer: Self-pay | Admitting: Pulmonary Disease

## 2020-09-27 DIAGNOSIS — G4733 Obstructive sleep apnea (adult) (pediatric): Secondary | ICD-10-CM | POA: Diagnosis not present

## 2020-09-27 NOTE — Telephone Encounter (Signed)
HST 09/24/20 >> AHI 16, SpO2 low 84%   Please inform her that her sleep study shows moderate obstructive sleep apnea.  Please arrange for ROV with me or NP to discuss treatment options.

## 2020-09-28 DIAGNOSIS — M79676 Pain in unspecified toe(s): Secondary | ICD-10-CM

## 2020-09-28 NOTE — Telephone Encounter (Signed)
Attempted to call patient to go over HST results, no answer and unable to leave voicemail. Will try again at another time.

## 2020-10-01 ENCOUNTER — Telehealth: Payer: Medicaid Other | Admitting: Physician Assistant

## 2020-10-01 DIAGNOSIS — B373 Candidiasis of vulva and vagina: Secondary | ICD-10-CM | POA: Diagnosis not present

## 2020-10-01 DIAGNOSIS — B3731 Acute candidiasis of vulva and vagina: Secondary | ICD-10-CM

## 2020-10-01 MED ORDER — FLUCONAZOLE 150 MG PO TABS: 150.0000 mg | ORAL_TABLET | Freq: Once | ORAL | 0 refills | Status: AC

## 2020-10-01 NOTE — Progress Notes (Signed)
We are sorry that you are not feeling well. Here is how we plan to help! Based on what you shared with me it looks like you: May have a yeast vaginosis  Vaginosis is an inflammation of the vagina that can result in discharge, itching and pain. The cause is usually a change in the normal balance of vaginal bacteria or an infection. Vaginosis can also result from reduced estrogen levels after menopause.  The most common causes of vaginosis are:   Bacterial vaginosis which results from an overgrowth of one on several organisms that are normally present in your vagina.   Yeast infections which are caused by a naturally occurring fungus called candida.   Vaginal atrophy (atrophic vaginosis) which results from the thinning of the vagina from reduced estrogen levels after menopause.   Trichomoniasis which is caused by a parasite and is commonly transmitted by sexual intercourse.  Factors that increase your risk of developing vaginosis include: . Medications, such as antibiotics and steroids . Uncontrolled diabetes . Use of hygiene products such as bubble bath, vaginal spray or vaginal deodorant . Douching . Wearing damp or tight-fitting clothing . Using an intrauterine device (IUD) for birth control . Hormonal changes, such as those associated with pregnancy, birth control pills or menopause . Sexual activity . Having a sexually transmitted infection  Your treatment plan is A single Diflucan (fluconazole) 150mg tablet once.  I have electronically sent this prescription into the pharmacy that you have chosen.  Be sure to take all of the medication as directed. Stop taking any medication if you develop a rash, tongue swelling or shortness of breath. Mothers who are breast feeding should consider pumping and discarding their breast milk while on these antibiotics. However, there is no consensus that infant exposure at these doses would be harmful.  Remember that medication creams can weaken latex  condoms. .   HOME CARE:  Good hygiene may prevent some types of vaginosis from recurring and may relieve some symptoms:  . Avoid baths, hot tubs and whirlpool spas. Rinse soap from your outer genital area after a shower, and dry the area well to prevent irritation. Don't use scented or harsh soaps, such as those with deodorant or antibacterial action. . Avoid irritants. These include scented tampons and pads. . Wipe from front to back after using the toilet. Doing so avoids spreading fecal bacteria to your vagina.  Other things that may help prevent vaginosis include:  . Don't douche. Your vagina doesn't require cleansing other than normal bathing. Repetitive douching disrupts the normal organisms that reside in the vagina and can actually increase your risk of vaginal infection. Douching won't clear up a vaginal infection. . Use a latex condom. Both female and female latex condoms may help you avoid infections spread by sexual contact. . Wear cotton underwear. Also wear pantyhose with a cotton crotch. If you feel comfortable without it, skip wearing underwear to bed. Yeast thrives in moist environments Your symptoms should improve in the next day or two.  GET HELP RIGHT AWAY IF:  . You have pain in your lower abdomen ( pelvic area or over your ovaries) . You develop nausea or vomiting . You develop a fever . Your discharge changes or worsens . You have persistent pain with intercourse . You develop shortness of breath, a rapid pulse, or you faint.  These symptoms could be signs of problems or infections that need to be evaluated by a medical provider now.  MAKE SURE YOU      Understand these instructions.  Will watch your condition.  Will get help right away if you are not doing well or get worse.  Your e-visit answers were reviewed by a board certified advanced clinical practitioner to complete your personal care plan. Depending upon the condition, your plan could have included  both over the counter or prescription medications. Please review your pharmacy choice to make sure that you have choses a pharmacy that is open for you to pick up any needed prescription, Your safety is important to [place]. If you have drug allergies check your prescription carefully.   You can use MyChart to ask questions about today's visit, request a non-urgent call back, or ask for a work or school excuse for 24 hours related to this e-Visit. If it has been greater than 24 hours you will need to follow up with your provider, or enter a new e-Visit to address those concerns. You will get a MyChart message within the next two days asking about your experience. I hope that your e-visit has been valuable and will speed your recovery.  I provided 5 minutes of non face-to-face time during this encounter for chart review and documentation.

## 2020-10-05 ENCOUNTER — Telehealth: Payer: Self-pay | Admitting: Pulmonary Disease

## 2020-10-05 NOTE — Telephone Encounter (Signed)
Called and spoke with patient who stated she was calling stating she hasn't received her HST results.   Chesley Mires, MD        HST 09/24/20 >> AHI 16, SpO2 low 84%   Please inform her that her sleep study shows moderate obstructive sleep apnea.  Please arrange for ROV with me or NP to discuss treatment options.     Went over [employer] results per Dr Halford Chessman with patient. All questions answered and patient expressed full understanding. Scheduled televisit with NP for Monday 10/11/20 at 9:30am. Patient agreeable with time and date and expressed understanding about televisit. Phone number to call confirmed. Nothing further needed at this time.

## 2020-10-05 NOTE — Telephone Encounter (Signed)
See telephone note from today 10/05/20. Went over [employer].

## 2020-10-07 NOTE — Telephone Encounter (Signed)
Forms from social security disability received on 09/15/20. Completed patient auth attached. Took form to MD [employer] for completion. Patient came in today paid 29.00 cash and filled out release paperwork . Forms transferred to [facility]

## 2020-10-11 ENCOUNTER — Other Ambulatory Visit: Payer: Self-pay

## 2020-10-11 ENCOUNTER — Ambulatory Visit: Payer: Medicaid Other | Admitting: Primary Care

## 2020-10-12 ENCOUNTER — Telehealth: Payer: Medicaid Other | Admitting: Emergency Medicine

## 2020-10-12 DIAGNOSIS — R300 Dysuria: Secondary | ICD-10-CM

## 2020-10-12 MED ORDER — CEPHALEXIN 500 MG PO CAPS: 500.0000 mg | ORAL_CAPSULE | Freq: Two times a day (BID) | ORAL | 0 refills | Status: DC

## 2020-10-12 NOTE — Progress Notes (Signed)
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
## 2020-10-18 ENCOUNTER — Other Ambulatory Visit: Payer: Self-pay

## 2020-10-18 ENCOUNTER — Ambulatory Visit (INDEPENDENT_AMBULATORY_CARE_PROVIDER_SITE_OTHER): Payer: Medicaid Other | Admitting: Primary Care

## 2020-10-18 ENCOUNTER — Encounter: Payer: Self-pay | Admitting: Primary Care

## 2020-10-18 DIAGNOSIS — R0683 Snoring: Secondary | ICD-10-CM

## 2020-10-18 NOTE — Progress Notes (Signed)
Reviewed and agree with assessment/plan.   Chesley Mires, MD [facility] Pulmonary/Critical Care 10/18/2020, 10:13 AM Pager:  [phone]

## 2020-10-18 NOTE — Progress Notes (Signed)
Virtual Visit via Telephone Note  I connected with Christina Cameron on 10/18/20 at  9:30 AM EDT by telephone and verified that I am speaking with the correct person using two identifiers.  Location: Patient: Home Provider: Office    I discussed the limitations, risks, security and privacy concerns of performing an evaluation and management service by telephone and the availability of in person appointments. I also discussed with the patient that there may be a patient responsible charge related to this service. The patient expressed understanding and agreed to proceed.   History of Present Illness: 55 year old female, never smoked. PMH significant for chronic diastolic heart failure, HTn, asthma, DM type 2, mixed hyperlipidemia, obesity. Patient of Dr. Halford Chessman, seen for initial sleep consult on 08/25/20.   Previous [facility] pulmonary encounter: She was seen by Dr. Melvyn Novas in April 2021.  At the time concern was for upper airway cough syndrome versus cough variant asthma.  Also concern ACE inhibitor cough.  She had COVID 19 infection in December 2020 and much of her cough symptoms occurred after this.  She was more recently seen by cardiology palpitations and there was concern she could have sleep apnea.  Her daughter had severe sleep apnea and died in her sleep after getting a respiratory infection.  She snores and wakes up hearing herself snore.  She is a restless sleeper.  She will wake up feeling her heart race.  She is tired all the time, and can fall asleep whenever she is sitting.  She goes to sleep between 1 to 3 am.  She falls asleep in few minutes.  She wakes up 4 to 5 times to use the bathroom.  She gets out of bed at 7 am.  She feels tired in the morning.  She gets frequent migraine headaches.  She does not use anything to help her fall sleep or stay awake.  She will talk in her sleep.  She denies sleep walking, bruxism, or nightmares.  There is no history of restless legs.  She denies sleep  hallucinations, sleep paralysis, or cataplexy.  The Epworth score is 20 out of 24.   10/18/2020- Interim hx  Patient contacted today to review sleep study results. HST 09/24/20 showed moderate OSA, AHI 16/hr. She reports symptoms of snoring and restless sleep. Her daughter recently passed away, she had severe sleep apnea. We discussed treatment options including weight loss, oral appliance, CPAP therapy and referral to ENT for possible surgical options. She is somewhat hesitant to try CPAP but would be open to using nasal mask if able.    Observations/Objective:  - Able to speak in full sentences; no overt shortness of breath, wheezing or cough  Assessment and Plan:  Moderate OSA: - Patient has symptoms of snoring and restless sleep - HST 09/24/20 showed moderate OSA, AHI 16/hr - Reviewed treatment options, patient is interested in trying CPAP therapy  - DME order placed for new CPAP 5-15cm h20 with mask of choice - Advised patient aim to wear CPAP every night for 4-6 hours or longer (she was made aware that there is a backlog to get Resmed machine) - Recommend weight loss, side sleeping position and avoiding sedating medication/alcohol prior to bedtime    Follow Up Instructions:  - 3 months with Eustaquio Maize NP    I discussed the assessment and treatment plan with the patient. The patient was provided an opportunity to ask questions and all were answered. The patient agreed with the plan and demonstrated an  understanding of the instructions.   The patient was advised to call back or seek an in-person evaluation if the symptoms worsen or if the condition fails to improve as anticipated.  I provided 22 minutes of non-face-to-face time during this encounter.   Martyn Ehrich, NP

## 2020-10-18 NOTE — Patient Instructions (Signed)
Home sleep study showed moderate obstructive sleep apnea  Recommendations: - Aim to wear CPAP every night for min 4-6 hours or longer - Do not drive if experiencing excessive daytime fatigue or somnolence  - Do not dink alcohol in excess or take sedating medication prior to bedtime as these can worsen underlying sleep apnea   Follow-up: - 3 month televisit with Beth NP    CPAP and BPAP Information CPAP and BPAP are methods that use air pressure to keep your airways open and to help you breathe well. CPAP and BPAP use different amounts of pressure. Your health care provider will tell you whether CPAP or BPAP would be more helpful for you.  CPAP stands for "continuous positive airway pressure." With CPAP, the amount of pressure stays the same while you breathe in and out.  BPAP stands for "bi-level positive airway pressure." With BPAP, the amount of pressure will be higher when you breathe in (inhale) and lower when you breathe out(exhale). This allows you to take larger breaths. CPAP or BPAP may be used in the hospital, or your health care provider may want you to use it at home. You may need to have a sleep study before your health care provider can order a machine for you to use at home. Why are CPAP and BPAP treatments used? CPAP or BPAP can be helpful if you have:  Sleep apnea.  Chronic obstructive pulmonary disease (COPD).  Heart failure.  Medical conditions that cause muscle weakness, including muscular dystrophy or amyotrophic lateral sclerosis (ALS).  Other problems that cause breathing to be shallow, weak, abnormal, or difficult. CPAP and BPAP are most commonly used for obstructive sleep apnea (OSA) to keep the airways from collapsing when the muscles relax during sleep. How is CPAP or BPAP administered? Both CPAP and BPAP are provided by a small machine with a flexible plastic tube that attaches to a plastic mask that you wear. Air is blown through the mask into your nose  or mouth. The amount of pressure that is used to blow the air can be adjusted on the machine. Your health care provider will set the pressure setting and help you find the best mask for you. When should CPAP or BPAP be used? In most cases, the mask only needs to be worn during sleep. Generally, the mask needs to be worn throughout the night and during any daytime naps. People with certain medical conditions may also need to wear the mask at other times when they are awake. Follow instructions from your health care provider about when to use the machine. What are some tips for using the mask?  Because the mask needs to be snug, some people feel trapped or closed-in (claustrophobic) when first using the mask. If you feel this way, you may need to get used to the mask. One way to do this is to hold the mask loosely over your nose or mouth and then gradually apply the mask more snugly. You can also gradually increase the amount of time that you use the mask.  Masks are available in various types and sizes. If your mask does not fit well, talk with your health care provider about getting a different one. Some common types of masks include: ? Full face masks, which fit over the mouth and nose. ? Nasal masks, which fit over the nose. ? Nasal pillow or prong masks, which fit into the nostrils.  If you are using a mask that fits over your nose  and you tend to breathe through your mouth, a chin strap may be applied to help keep your mouth closed.  Some CPAP and BPAP machines have alarms that may sound if the mask comes off or develops a leak.  If you have trouble with the mask, it is very important that you talk with your health care provider about finding a way to make the mask easier to tolerate. Do not stop using the mask. There could be a negative impact to your health if you stop using the mask.   What are some tips for using the machine?  Place your CPAP or BPAP machine on a secure table or stand near  an electrical outlet.  Know where the on/off switch is on the machine.  Follow instructions from your health care provider about how to set the pressure on your machine and when you should use it.  Do not eat or drink while the CPAP or BPAP machine is on. Food or fluids could get pushed into your lungs by the pressure of the CPAP or BPAP.  For home use, CPAP and BPAP machines can be rented or purchased through home health care companies. Many different brands of machines are available. Renting a machine before purchasing may help you find out which particular machine works well for you. Your insurance may also decide which machine you may get.  Keep the CPAP or BPAP machine and attachments clean. Ask your health care provider for specific instructions. Follow these instructions at home:  Do not use any products that contain nicotine or tobacco, such as cigarettes, e-cigarettes, and chewing tobacco. If you need help quitting, ask your health care provider.  Keep all follow-up visits as told by your health care provider. This is important. Contact a health care provider if:  You have redness or pressure sores on your head, face, mouth, or nose from the mask or head gear.  You have trouble using the CPAP or BPAP machine.  You cannot tolerate wearing the CPAP or BPAP mask.  Someone tells you that you snore even when wearing your CPAP or BPAP. Get help right away if:  You have trouble breathing.  You feel confused. Summary  CPAP and BPAP are methods that use air pressure to keep your airways open and to help you breathe well.  You may need to have a sleep study before your health care provider can order a machine for home use.  If you have trouble with the mask, it is very important that you talk with your health care provider about finding a way to make the mask easier to tolerate. Do not stop using the mask. There could be a negative impact to your health if you stop using the  mask.  Follow instructions from your health care provider about when to use the machine. This information is not intended to replace advice given to you by your health care provider. Make sure you discuss any questions you have with your health care provider. Document Revised: 06/06/2019 Document Reviewed: 06/09/2019 Elsevier Patient Education  2021 [employer]   Sleep Apnea Sleep apnea affects breathing during sleep. It causes breathing to stop for a short time or to become shallow. It can also increase the risk of:  Heart attack.  Stroke.  Being very overweight (obese).  Diabetes.  Heart failure.  Irregular heartbeat. The goal of treatment is to help you breathe normally again. What are the causes? There are three kinds of sleep apnea:  Obstructive  sleep apnea. This is caused by a blocked or collapsed airway.  Central sleep apnea. This happens when the brain does not send the right signals to the muscles that control breathing.  Mixed sleep apnea. This is a combination of obstructive and central sleep apnea. The most common cause of this condition is a collapsed or blocked airway. This can happen if:  Your throat muscles are too relaxed.  Your tongue and tonsils are too large.  You are overweight.  Your airway is too small.   What increases the risk?  Being overweight.  Smoking.  Having a small airway.  Being older.  Being female.  Drinking alcohol.  Taking medicines to calm yourself (sedatives or tranquilizers).  Having family members with the condition. What are the signs or symptoms?  Trouble staying asleep.  Being sleepy or tired during the day.  Getting angry a lot.  Loud snoring.  Headaches in the morning.  Not being able to focus your mind (concentrate).  Forgetting things.  Less interest in sex.  Mood swings.  Personality changes.  Feelings of sadness (depression).  Waking up a lot during the night to pee (urinate).  Dry  mouth.  Sore throat. How is this diagnosed?  Your medical history.  A physical exam.  A test that is done when you are sleeping (sleep study). The test is most often done in a sleep lab but may also be done at home. How is this treated?  Sleeping on your side.  Using a medicine to get rid of mucus in your nose (decongestant).  Avoiding the use of alcohol, medicines to help you relax, or certain pain medicines (narcotics).  Losing weight, if needed.  Changing your diet.  Not smoking.  Using a machine to open your airway while you sleep, such as: ? An oral appliance. This is a mouthpiece that shifts your lower jaw forward. ? A CPAP device. This device blows air through a mask when you breathe out (exhale). ? An EPAP device. This has valves that you put in each nostril. ? A BPAP device. This device blows air through a mask when you breathe in (inhale) and breathe out.  Having surgery if other treatments do not work. It is important to get treatment for sleep apnea. Without treatment, it can lead to:  High blood pressure.  Coronary artery disease.  In men, not being able to have an erection (impotence).  Reduced thinking ability.   Follow these instructions at home: Lifestyle  Make changes that your doctor recommends.  Eat a healthy diet.  Lose weight if needed.  Avoid alcohol, medicines to help you relax, and some pain medicines.  Do not use any products that contain nicotine or tobacco, such as cigarettes, e-cigarettes, and chewing tobacco. If you need help quitting, ask your doctor. General instructions  Take over-the-counter and prescription medicines only as told by your doctor.  If you were given a machine to use while you sleep, use it only as told by your doctor.  If you are having surgery, make sure to tell your doctor you have sleep apnea. You may need to bring your device with you.  Keep all follow-up visits as told by your doctor. This is  important. Contact a doctor if:  The machine that you were given to use during sleep bothers you or does not seem to be working.  You do not get better.  You get worse. Get help right away if:  Your chest hurts.  You  have trouble breathing in enough air.  You have an uncomfortable feeling in your back, arms, or stomach.  You have trouble talking.  One side of your body feels weak.  A part of your face is hanging down. These symptoms may be an emergency. Do not wait to see if the symptoms will go away. Get medical help right away. Call your local emergency services (911 in the [place] Do not drive yourself to the hospital. Summary  This condition affects breathing during sleep.  The most common cause is a collapsed or blocked airway.  The goal of treatment is to help you breathe normally while you sleep. This information is not intended to replace advice given to you by your health care provider. Make sure you discuss any questions you have with your health care provider. Document Revised: 03/01/2018 Document Reviewed: 01/08/2018 Elsevier Patient Education  [place].

## 2020-10-20 ENCOUNTER — Ambulatory Visit (INDEPENDENT_AMBULATORY_CARE_PROVIDER_SITE_OTHER): Payer: Medicaid Other

## 2020-10-20 ENCOUNTER — Other Ambulatory Visit: Payer: Self-pay

## 2020-10-20 DIAGNOSIS — R0602 Shortness of breath: Secondary | ICD-10-CM | POA: Diagnosis not present

## 2020-10-20 LAB — ECHOCARDIOGRAM COMPLETE
AR max vel: 2.29 cm2
AV Area VTI: 2.41 cm2
AV Area mean vel: 2.29 cm2
AV Mean grad: 3.9 mmHg
AV Peak grad: 7.8 mmHg
Ao pk vel: 1.4 m/s
Area-P 1/2: 3.83 cm2
MV M vel: 4.66 m/s
MV Peak grad: 87 mmHg
S' Lateral: 3.35 cm
Single Plane A4C EF: 68.4 %

## 2020-10-21 ENCOUNTER — Telehealth: Payer: Self-pay | Admitting: *Deleted

## 2020-10-21 NOTE — Telephone Encounter (Signed)
-----   Message from Verta Ellen., NP sent at 10/20/2020  3:41 PM EDT ----- Please call the patient and let her know the echocardiogram showed she has good pumping function of the heart. She has no leaky valves or narrowed valves. The left pumping chamber is mildly more muscular than normal. Best management is to keep blood pressure at or below 130/80 conistently . No evidence on echo which would contribute to shortness of breath.

## 2020-10-21 NOTE — Progress Notes (Addendum)
Cardiology Office Note  Date: 10/22/2020   ID: Deshonda, Cryderman [birth year], MRN [id number]  PCP:  Bonnita Hollow, MD  Cardiologist:  None Electrophysiologist:  None   Chief Complaint: Shortness of breath  History of Present Illness: Christina Cameron is a 55 y.o. female with a history of  palpitations/previous SVT ablation 2005, chest pain, HTN, HLD, bilateral leg edema, morbid obesity.  Previous patient of  Dr. Bronson Ing. Previous diagnosis of COVID-19 virus 05/29/2019. Previously experiencing chest tightness, shortness of breath, palpitations, dizziness.  Started Ranexa 500 mg p.o. twice daily. Blood pressure previously markedly elevated. She was started on lisinopril 10 mg. Lipids markedly elevated in the past. Crestor previously increased to 40 mg daily. She was continuing Lasix as needed for lower extremity edema.  She was here last visit for complaint of waking up short of breath with chest tightness, racing heart, and dizziness.  She states she took her Ranexa which eventually resolved her chest tightness.  She stated her blood sugar had been elevated recently with most recent random blood sugar  221.  Blood pressure  elevated at 140/86 with a heart rate of 75.  Currently taking irbesartan 300 mg daily.  Furosemide 20 mg daily as needed for fluid.  Metoprolol 50 mg p.o. twice daily..  She has an upcoming appointment with Dr. Halford Chessman for evaluation for sleep apnea.  She is dealing with some significant stress due to the death of her daughter in late May 20, 2023 of this past year. In October 2021 she had a low risk stress test.  Previous cardiac monitor in April 2021 demonstrating sinus rhythm and sinus tachycardia with rare PACs and a brief atrial run.  No sustained arrhythmias.  Previous cardiac catheterization 2016 normal coronary arteries.  Echocardiogram February 2021 EF 60-65% moderate, LVH, G1 DD.  Previously started on Cardizem for palpitations which had been titrated up in the  past.  She saw Dr. Halford Chessman recently on 08/25/2020 and he ordered a home sleep study due to suspected sleep apnea.  She presents today stating her palpitations are a little better but they still occur.  She continues with some shortness of breath.  Her last echocardiogram in February 2021 showed EF 60 to 65%.  moderate concentric LVH.  G1 DD.  She states when she walks she becomes more short of breath and seems to significantly speed up her heart rate.  She also states she is still dealing with the death of her daughter last year and is going to see a psychologist and is starting with a grief coping group at church.  She states Dr. Juanetta Gosling office has not contacted her regarding getting her sleep study.  Blood pressure appears reasonably controlled today.  BP today 130/82.  Still having some issues with controlling her blood sugars.  She states she needs knee surgery but surgeon stated she needed better control over her diabetes before surgery could be done.  She is here for follow-up for recent echocardiogram.  Still complains of some shortness of breath and occasional palpitations.  Otherwise denies any anginal symptoms, orthostatic symptoms, CVA or TIA-like symptoms, bleeding issues.  Still having some issues with blood sugar control.  Denies any claudication issues but states she has 2 bad knees and both need replacement.  She states this limits her ability to exercise or increase her activity. She had a recent home sleep study with results showing moderate obstructive sleep apnea with an AHI of 16 and SPO2 low of 84%.  Recommendations  were weight loss, CPAP, oral appliance, or surgical assessment.  She has yet to receive her CPAP device.  Blood pressure is reasonably controlled at 138/86.  Advised her the goal is to keep blood pressure at or below 130/80.  Past Medical History:  Diagnosis Date   Arthritis    right knee   Asthma    Cancer ([place])    cervical 1989   Chest pain    a. normal cors by cath in  11/2014   Chronic diastolic (congestive) heart failure [facility])    COVID-17 June 2019, January 2022   Diabetes mellitus, type 2 ([place])    High cholesterol    Hypertension    Morbid (severe) obesity due to excess calories ([facility])    Osteoarthritis of knee, unspecified    Palpitations    Raynaud's syndrome    SVT (supraventricular tachycardia) ([place])    a. s/p ablation by Dr. Lovena Le in 2006.    Past Surgical History:  Procedure Laterality Date   CARDIAC CATHETERIZATION     CARDIAC CATHETERIZATION N/A 12/14/2014   Procedure: Right/Left Heart Cath and Coronary Angiography;  Surgeon: Sherren Mocha, MD;  Location: [facility];  Service: Cardiovascular;  Laterality: N/A;   CARDIAC ELECTROPHYSIOLOGY STUDY AND ABLATION     CESAREAN SECTION     x2   ENDOMETRIAL ABLATION      Current Outpatient Medications  Medication Sig Dispense Refill   albuterol (VENTOLIN HFA) 108 (90 Base) MCG/ACT inhaler Inhale 1-2 puffs into the lungs every 6 (six) hours as needed for wheezing or shortness of breath. 18 g 0   ARIPiprazole (ABILIFY) 10 MG tablet Take 10 mg by mouth daily.     busPIRone (BUSPAR) 10 MG tablet Take 10 mg by mouth 2 (two) times daily.     cetirizine (ZYRTEC) 10 MG tablet Take 10 mg by mouth as needed for allergies.     doxycycline (VIBRA-TABS) 100 MG tablet Take 100 mg by mouth 2 (two) times daily.     FARXIGA 10 MG TABS tablet Take 10 mg by mouth daily.     furosemide (LASIX) 20 MG tablet Take 1 tablet (20 mg total) by mouth daily as needed for fluid. 30 tablet 3   ibuprofen (ADVIL) 800 MG tablet Take 800 mg by mouth every 8 (eight) hours as needed.     insulin glargine (LANTUS) 100 UNIT/ML injection Inject 30 Units into the skin 2 (two) times daily.     irbesartan (AVAPRO) 300 MG tablet Take 300 mg by mouth daily.     meloxicam (MOBIC) 15 MG tablet Take 15 mg by mouth daily.     metFORMIN (GLUCOPHAGE) 500 MG tablet Take 1,000 mg by mouth 2 (two) times daily with a meal.      metoprolol tartrate (LOPRESSOR) 50 MG tablet Take 50 mg by mouth 2 (two) times daily.     omeprazole (PRILOSEC) 40 MG capsule Take 40 mg by mouth daily.     pregabalin (LYRICA) 100 MG capsule Take 100 mg by mouth 3 (three) times daily.     ranolazine (RANEXA) 500 MG 12 hr tablet Take 500 mg by mouth 2 (two) times daily.     rosuvastatin (CRESTOR) 40 MG tablet Take 40 mg by mouth daily.     sertraline (ZOLOFT) 50 MG tablet Take 50 mg by mouth daily.     TRESIBA FLEXTOUCH 200 UNIT/ML FlexTouch Pen Inject into the skin.     cephALEXin (KEFLEX) 500 MG capsule Take 1 capsule (500  mg total) by mouth 2 (two) times daily. (Patient not taking: No sig reported) 14 capsule 0   desvenlafaxine (PRISTIQ) 100 MG 24 hr tablet Take 100 mg by mouth daily. (Patient not taking: Reported on 10/22/2020)     diltiazem (CARDIZEM) 90 MG tablet Take 1 tablet (90 mg total) by mouth 2 (two) times daily. 60 tablet 6   Dulaglutide (TRULICITY) 1.5 CH/8.8FO SOPN Inject 1.5 mg into the skin every Sunday.  (Patient not taking: Reported on 10/22/2020)     mupirocin ointment (BACTROBAN) 2 % Apply 1 application topically 2 (two) times daily. (Patient not taking: Reported on 10/22/2020) 30 g 2   No current facility-administered medications for this visit.   Allergies:  Patient has no known allergies.   Social History: The patient  reports that she has never smoked. She has never used smokeless tobacco. She reports current alcohol use. She reports that she does not use drugs.   Family History: The patient's family history includes Cancer in her father and mother; Hypertension in her brother; Stroke in her brother.   ROS:  Please see the history of present illness. Otherwise, complete review of systems is positive for none.  All other systems are reviewed and negative.   Physical Exam: VS:  BP 138/86   Pulse 67   Ht 5\' 4"  (1.626 m)   Wt 272 lb 6.4 oz (123.6 kg)   SpO2 97%   BMI 46.76 kg/m , BMI Body mass index is 46.76  kg/m.  Wt Readings from Last 3 Encounters:  10/22/20 272 lb 6.4 oz (123.6 kg)  09/10/20 272 lb 6.4 oz (123.6 kg)  08/25/20 272 lb 9.6 oz (123.7 kg)    General: Morbidly obese patient appears comfortable at rest. Neck: Supple, no elevated JVP or carotid bruits, no thyromegaly. Lungs: Clear to auscultation, nonlabored breathing at rest. Cardiac: Regular rate and rhythm, no S3 or significant systolic murmur, no pericardial rub. Extremities: No pitting edema, distal pulses 2+. Skin: Warm and dry. Musculoskeletal: No kyphosis. Neuropsychiatric: Alert and oriented x3, affect grossly appropriate.  ECG:  An ECG dated 07/30/2020 was personally reviewed today and demonstrated:  Normal sinus rhythm rate of 81, left axis deviation, pulmonary disease pattern, septal infarct, age undetermined  Recent Labwork: No results found for requested labs within last 8760 hours.     Component Value Date/Time   CHOL 229 (H) 08/07/2019 0814   TRIG 163 (H) 08/07/2019 0814   HDL 47 (L) 08/07/2019 0814   CHOLHDL 4.9 08/07/2019 0814   LDLCALC 152 (H) 08/07/2019 0814    Other Studies Reviewed Today:  Echocardiogram 10/20/2020   1. Left ventricular ejection fraction, by estimation, is 60 to 65%. The left ventricle has normal function. The left ventricle has no regional wall motion abnormalities. There is mild left ventricular hypertrophy. Left ventricular diastolic parameters were normal. The average left ventricular global longitudinal strain is 18.1 %. The global longitudinal strain is normal. 2. Right ventricular systolic function is normal. The right ventricular size is normal. 3. The mitral valve is normal in structure. No evidence of mitral valve regurgitation. No evidence of mitral stenosis. 4. The aortic valve has an indeterminant number of cusps. There is mild calcification of the aortic valve. There is mild thickening of the aortic valve. Aortic valve regurgitation is not visualized. No aortic  stenosis is present. 5. The inferior vena cava is normal in size with greater than 50% respiratory variability, suggesting right atrial pressure of 3 mmHg. Comparison(s): Echocardiogram done 07/21/19 showed  an EF of 60-65%.  Home sleep study 09/24/2020 She had a recent home sleep study with results showing moderate obstructive sleep apnea with an AHI of 16 and SPO2 low of 84%.  Recommendations were weight loss, CPAP, oral appliance, or surgical assessment.     Nuclear stress test 03/02/2020 Narrative & Impression  There was no ST segment deviation noted during stress. The study is normal. There are no perfusion defects consistent with prior infarct or current ischemia. This is a low risk study. The left ventricular ejection fraction is normal (55-65%).       Vascular lower extremity arterial duplex/ABI 04/20/2020 Right: Resting right ankle-brachial index is within normal range. No evidence of significant right lower extremity arterial disease. The right toe-brachial index is normal. Left: Resting left ankle-brachial index is within normal range. No evidence of significant left lower extremity arterial disease. The left toe-brachial index is normal.   Lower venous study 12/29/2019 left leg IMPRESSION: No evidence of deep venous thrombosis     Cardiac monitor 09/23/2019 Study Highlights Sinus rhythm and sinus tachycardia with rare PACs and a very brief atrial run. There were no sustained arrhythmias.     Cardiac catheterization on 12/14/2014 demonstrated angiographically normal coronary arteries and normal intracardiac pressures.     Echocardiogram 07/21/2019:   1. Left ventricular ejection fraction, by estimation, is 60 to 65%. The  left ventricle has normal function. The left ventricle has no regional  wall motion abnormalities. There is moderate concentric left ventricular  hypertrophy. Left ventricular  diastolic parameters are consistent with Grade I diastolic dysfunction   (impaired relaxation).   2. Right ventricular systolic function is normal. The right ventricular  size is mildly enlarged.   3. Left atrial size was mildly dilated.   4. The mitral valve is grossly normal. No evidence of mitral valve  regurgitation.   5. The aortic valve is grossly normal. Aortic valve regurgitation is not  visualized.   6. The inferior vena cava is normal in size with greater than 50%  respiratory variability, suggesting right atrial pressure of 3 mmHg.   Assessment and Plan:  1. Shortness of breath   2. Racing heart beat   3. Bilateral arm weakness   4. Essential hypertension, benign   5. Bilateral leg edema   6. Morbid obesity, unspecified obesity type ([place])   7. Obstructive sleep apnea    1. Shortness of breath Recently complained of waking up short of breath with racing heart, dizziness.  Has a history of palpitations.  This likely could be related to untreated OSA, obesity hypoventilation syndrome.   Recent echocardiogram 10/20/2020 demonstrated EF of 60 to 65%.  No WMA's.  Mild LVH.  She had a recent home sleep study on 09/24/2020 demonstrating moderate obstructive sleep apnea with AHI of 16 and SPO2 low of 84%.  She has been prescribed CPAP which she has not received yet.  2. Racing heart beat Previous complaint of of waking up with sudden shortness of breath, palpitations, dizziness.  Also complained of some chest tightness associated.  States she took a Ranexa which resolved the racing heart and chest tightness.  Today she states palpitations have decreased in frequency and duration.  Continue diltiazem 90 mg p.o. twice daily.  3. Bilateral arm weakness No complaints of bilateral arm weakness since last visit.  At last visit she stated  she woke up with sudden shortness of breath, palpitations, dizziness and felt weak in her arms and shoulders.  4. Essential hypertension, benign  BP 138/86.  Continue irbesartan 300 mg daily.  Continue metoprolol 50 mg p.o.  twice daily.  Continue Cardizem to 90 mg p.o. twice daily for palpitations.   5. Bilateral leg edema Has chronic bilateral lower extremity edema.  She has as needed furosemide 20 mg.  Continue furosemide as needed.  6. Morbid obesity, unspecified obesity type ([place]) BMI of 45.97.  Advised weight loss, dietary modifications.  7.  Obstructive sleep apnea She had a recent home sleep study with results showing moderate obstructive sleep apnea with an AHI of 16 and SPO2 low of 84%.  Recommendations were weight loss, CPAP, oral appliance, or surgical assessment.  Medication Adjustments/Labs and Tests Ordered: Current medicines are reviewed at length with the patient today.  Concerns regarding medicines are outlined above.   Disposition: Follow-up with Dr. Harl Bowie or APP 6 weeks Signed, [name], NP 10/22/2020 9:29 AM    [facility] [place], [place], [place] [address] Phone: [phone]; Fax: [phone]

## 2020-10-21 NOTE — Telephone Encounter (Signed)
Laurine Blazer, LPN  01/25/9406 68:08 AM EDT Back to Top     Notified, copy to pcp.

## 2020-10-22 ENCOUNTER — Ambulatory Visit (INDEPENDENT_AMBULATORY_CARE_PROVIDER_SITE_OTHER): Payer: Medicaid Other | Admitting: Family Medicine

## 2020-10-22 ENCOUNTER — Encounter: Payer: Self-pay | Admitting: Family Medicine

## 2020-10-22 VITALS — BP 138/86 | HR 67 | Ht 64.0 in | Wt 272.4 lb

## 2020-10-22 DIAGNOSIS — I10 Essential (primary) hypertension: Secondary | ICD-10-CM | POA: Diagnosis not present

## 2020-10-22 DIAGNOSIS — R29898 Other symptoms and signs involving the musculoskeletal system: Secondary | ICD-10-CM | POA: Diagnosis not present

## 2020-10-22 DIAGNOSIS — R0602 Shortness of breath: Secondary | ICD-10-CM

## 2020-10-22 DIAGNOSIS — R000 Tachycardia, unspecified: Secondary | ICD-10-CM

## 2020-10-22 DIAGNOSIS — G4733 Obstructive sleep apnea (adult) (pediatric): Secondary | ICD-10-CM

## 2020-10-22 DIAGNOSIS — R600 Localized edema: Secondary | ICD-10-CM

## 2020-10-22 MED ORDER — DILTIAZEM HCL 90 MG PO TABS: 90.0000 mg | ORAL_TABLET | Freq: Two times a day (BID) | ORAL | 6 refills | Status: DC

## 2020-10-22 NOTE — Patient Instructions (Signed)
Medication Instructions:  Continue all current medications.   Labwork: none  Testing/Procedures: none  Follow-Up: 6 months   Any Other Special Instructions Will Be Listed Below (If Applicable).   If you need a refill on your cardiac medications before your next appointment, please call your pharmacy.  

## 2020-10-26 ENCOUNTER — Ambulatory Visit: Payer: Medicaid Other | Admitting: Podiatry

## 2020-11-11 ENCOUNTER — Ambulatory Visit: Payer: Medicaid Other | Admitting: Podiatry

## 2020-11-19 ENCOUNTER — Ambulatory Visit: Payer: Medicaid Other | Admitting: Cardiology

## 2020-12-17 ENCOUNTER — Other Ambulatory Visit: Payer: Self-pay | Admitting: *Deleted

## 2020-12-17 ENCOUNTER — Telehealth: Payer: Self-pay | Admitting: Cardiology

## 2020-12-17 NOTE — Telephone Encounter (Signed)
New message     Patient would like a call back regarding her test results , she received a call a couple days ago about them but has questions

## 2020-12-17 NOTE — Telephone Encounter (Signed)
Spoke with pt and after research Appears labs on another pt has been scanned into her chart Pt aware will remove these labs and scan into appropriate chart .Adonis Housekeeper

## 2020-12-20 ENCOUNTER — Other Ambulatory Visit: Payer: Self-pay

## 2020-12-20 ENCOUNTER — Telehealth: Payer: Self-pay

## 2020-12-20 DIAGNOSIS — E1165 Type 2 diabetes mellitus with hyperglycemia: Secondary | ICD-10-CM

## 2020-12-20 DIAGNOSIS — E785 Hyperlipidemia, unspecified: Secondary | ICD-10-CM

## 2020-12-20 NOTE — Telephone Encounter (Deleted)
The original result note has been redacted due to error.

## 2020-12-20 NOTE — Progress Notes (Deleted)
The original result note has been redacted due to error.

## 2021-01-15 ENCOUNTER — Telehealth: Payer: Medicaid Other | Admitting: Physician Assistant

## 2021-01-15 ENCOUNTER — Encounter: Payer: Self-pay | Admitting: Physician Assistant

## 2021-01-15 DIAGNOSIS — R059 Cough, unspecified: Secondary | ICD-10-CM

## 2021-01-15 DIAGNOSIS — Z20822 Contact with and (suspected) exposure to covid-19: Secondary | ICD-10-CM

## 2021-01-15 MED ORDER — BENZONATATE 100 MG PO CAPS: 100.0000 mg | ORAL_CAPSULE | Freq: Two times a day (BID) | ORAL | 0 refills | Status: DC | PRN

## 2021-01-15 NOTE — Progress Notes (Signed)
E-Visit for Corona Virus Screening  Your current symptoms could be consistent with the coronavirus.  Many health care providers can now test patients at their office but not all are.  [facility] has multiple testing sites. For information on our [place] testing locations and hours go to [email or web address]  We are enrolling you in our [facility] for [place] . Daily you will receive a questionnaire within the [facility] website. Our COVID 19 response team will be monitoring your responses daily.  Testing Information: The COVID-19 Community Testing sites are testing BY APPOINTMENT ONLY.  You can schedule online at [email or web address]  If you do not have access to a smart phone or computer you may call [phone] for an appointment.   Additional testing sites in the Community:  For [facility] Testing sites in   FaceUpdate.uy  For Pop-up testing sites in [place]  BowlDirectory.co.uk  For [facility] [email or web address]  For [facility] testing in [place] and [employer] [email or web address]  For [facility] testing in [place]   [email or web address]  For  more information about community testing call [phone]   Please quarantine yourself while awaiting your test results. Please stay home for a minimum of 10 days from the first day of illness with improving symptoms and you have had 24 hours of no fever (without the use of Tylenol (Acetaminophen) Motrin (Ibuprofen) or any fever reducing medication).  Also - Do not get tested prior to returning to work because once you have had a positive test the test can stay positive for  more than a month in some cases.   You should wear a mask or cloth face covering over your nose and mouth if you must be around other people or animals, including pets (even at home). Try to stay at least 6 feet away from other people. This will protect the people around you.  Please continue good preventive care measures, including:  frequent hand-washing, avoid touching your face, cover coughs/sneezes, stay out of crowds and keep a 6 foot distance from others.  COVID-19 is a respiratory illness with symptoms that are similar to the flu. Symptoms are typically mild to moderate, but there have been cases of severe illness and death due to the virus.   The following symptoms may appear 2-14 days after exposure: Fever Cough Shortness of breath or difficulty breathing Chills Repeated shaking with chills Muscle pain Headache Sore throat New loss of taste or smell Fatigue Congestion or runny nose Nausea or vomiting Diarrhea  Go to the nearest hospital ED for assessment if fever/cough/breathlessness are severe or illness seems like a threat to life.  It is vitally important that if you feel that you have an infection such as this virus or any other virus that you stay home and away from places where you may spread it to others.  You should avoid contact with people age 51 and older.   You can use medication such as prescription cough medication called Tessalon Perles 100 mg. You may take 1-2 capsules every 8 hours as needed for cough  You may also take acetaminophen (Tylenol) as needed for fever.  Reduce your risk of any infection by using the same precautions used for avoiding the common cold or flu:  Wash your hands often with soap and warm water for at least 20 seconds.  If soap and water are not readily available, use an alcohol-based hand sanitizer with at least 60% alcohol.  If coughing or sneezing, cover your mouth  and nose by coughing or sneezing into the elbow areas of your shirt or  coat, into a tissue or into your sleeve (not your hands). Avoid shaking hands with others and consider head nods or verbal greetings only. Avoid touching your eyes, nose, or mouth with unwashed hands.  Avoid close contact with people who are sick. Avoid places or events with large numbers of people in one location, like concerts or sporting events. Carefully consider travel plans you have or are making. If you are planning any travel outside or inside the [place], visit the CDC's Travelers' Health webpage for the latest health notices. If you have some symptoms but not all symptoms, continue to monitor at home and seek medical attention if your symptoms worsen. If you are having a medical emergency, call 911.  HOME CARE Only take medications as instructed by your medical team. Drink plenty of fluids and get plenty of rest. A steam or ultrasonic humidifier can help if you have congestion.   GET HELP RIGHT AWAY IF YOU HAVE EMERGENCY WARNING SIGNS** FOR COVID-19. If you or someone is showing any of these signs seek emergency medical care immediately. Call 911 or proceed to your closest emergency facility if: You develop worsening high fever. Trouble breathing Bluish lips or face Persistent pain or pressure in the chest New confusion Inability to wake or stay awake You cough up blood. Your symptoms become more severe  **This list is not all possible symptoms. Contact your medical provider for any symptoms that are sever or concerning to you.  MAKE SURE YOU  Understand these instructions. Will watch your condition. Will get help right away if you are not doing well or get worse.  Your e-visit answers were reviewed by a board certified advanced clinical practitioner to complete your personal care plan.  Depending on the condition, your plan could have included both over the counter or prescription medications.  If there is a problem please reply once you have received a response from your  provider.  Your safety is important to [place].  If you have drug allergies check your prescription carefully.    You can use MyChart to ask questions about today's visit, request a non-urgent call back, or ask for a work or school excuse for 24 hours related to this e-Visit. If it has been greater than 24 hours you will need to follow up with your provider, or enter a new e-Visit to address those concerns. You will get an e-mail in the next two days asking about your experience.  I hope that your e-visit has been valuable and will speed your recovery. Thank you for using e-visits.  I spent 5-10 minutes on review and completion of this note- Lacy Duverney [facility]

## 2021-02-01 ENCOUNTER — Other Ambulatory Visit (HOSPITAL_COMMUNITY): Payer: Self-pay | Admitting: Obstetrics

## 2021-02-01 ENCOUNTER — Other Ambulatory Visit: Payer: Self-pay | Admitting: Obstetrics

## 2021-02-01 DIAGNOSIS — N950 Postmenopausal bleeding: Secondary | ICD-10-CM

## 2021-02-03 ENCOUNTER — Telehealth: Payer: Medicaid Other | Admitting: Physician Assistant

## 2021-02-03 DIAGNOSIS — H1031 Unspecified acute conjunctivitis, right eye: Secondary | ICD-10-CM

## 2021-02-03 MED ORDER — POLYMYXIN B-TRIMETHOPRIM 10000-0.1 UNIT/ML-% OP SOLN
1.0000 [drp] | OPHTHALMIC | 0 refills | Status: DC
Start: 2021-02-03 — End: 2021-03-11

## 2021-02-03 NOTE — Progress Notes (Signed)

## 2021-02-21 ENCOUNTER — Other Ambulatory Visit: Payer: Self-pay

## 2021-02-21 ENCOUNTER — Ambulatory Visit: Payer: Medicaid Other | Admitting: Podiatry

## 2021-02-21 ENCOUNTER — Telehealth: Payer: Medicaid Other | Admitting: Physician Assistant

## 2021-02-21 DIAGNOSIS — IMO0002 Reserved for concepts with insufficient information to code with codable children: Secondary | ICD-10-CM

## 2021-02-21 DIAGNOSIS — L0292 Furuncle, unspecified: Secondary | ICD-10-CM

## 2021-02-21 DIAGNOSIS — L03311 Cellulitis of abdominal wall: Secondary | ICD-10-CM

## 2021-02-21 DIAGNOSIS — B351 Tinea unguium: Secondary | ICD-10-CM

## 2021-02-21 DIAGNOSIS — B353 Tinea pedis: Secondary | ICD-10-CM

## 2021-02-21 MED ORDER — KETOCONAZOLE 2 % EX CREA: 1.0000 "application " | TOPICAL_CREAM | Freq: Every day | CUTANEOUS | 0 refills | Status: DC

## 2021-02-22 MED ORDER — SULFAMETHOXAZOLE-TRIMETHOPRIM 800-160 MG PO TABS: 1.0000 | ORAL_TABLET | Freq: Two times a day (BID) | ORAL | 0 refills | Status: DC

## 2021-02-22 NOTE — Progress Notes (Signed)
E Visit for Cellulitis  We are sorry that you are not feeling well. Here is how we plan to help!  Based on what you shared with me it looks like you have cellulitis.  Cellulitis looks like areas of skin redness, swelling, and warmth; it develops as a result of bacteria entering under the skin. Little red spots and/or bleeding can be seen in skin, and tiny surface sacs containing fluid can occur. Fever can be present. Cellulitis is almost always on one side of a body, and the lower limbs are the most common site of involvement.   I have prescribed:  Bactrim DS 1 tablet by mouth twice a day for 7 days  HOME CARE:  Take your medications as ordered and take all of them, even if the skin irritation appears to be healing.   GET HELP RIGHT AWAY IF:  Symptoms that don't begin to go away within 48 hours. Severe redness persists or worsens If the area turns color, spreads or swells. If it blisters and opens, develops yellow-brown crust or bleeds. You develop a fever or chills. If the pain increases or becomes unbearable.  Are unable to keep fluids and food down.  MAKE SURE YOU   Understand these instructions. Will watch your condition. Will get help right away if you are not doing well or get worse.  Thank you for choosing an e-visit.  Your e-visit answers were reviewed by a board certified advanced clinical practitioner to complete your personal care plan. Depending upon the condition, your plan could have included both over the counter or prescription medications.  Please review your pharmacy choice. Make sure the pharmacy is open so you can pick up prescription now. If there is a problem, you may contact your provider through MyChart messaging and have the prescription routed to another pharmacy.  Your safety is important to us. If you have drug allergies check your prescription carefully.   For the next 24 hours you can use MyChart to ask questions about today's visit, request a  non-urgent call back, or ask for a work or school excuse. You will get an email in the next two days asking about your experience. I hope that your e-visit has been valuable and will speed your recovery.  I provided 5 minutes of non face-to-face time during this encounter for chart review and documentation.   

## 2021-02-22 NOTE — Progress Notes (Signed)
duplicate

## 2021-02-25 NOTE — Progress Notes (Signed)
Subjective: 55 year old female presents the office today for concerns of numbness increasing mostly to her toes.  She is currently on Lyrica which she is still taking.  She is diabetic and last A1c was 8.7.  Her last blood sugar she checked was 215 this morning.  She states that her toes always feel cold as well.  She states that the right fourth nail came off on its own.  Denies any open sores.  Objective: AAO x3, NAD DP/PT pulses palpable bilaterally, CRT less than 3 seconds Protective sensation decreased with Simms Weinstein monofilament Right fourth nail plate, there is small amount dried blood present there is currently no bleeding or drainage or pus or swelling or redness.  On the right third interspace as well as medial arch of the right foot there is peeling, erythematous skin consistent with a mild tinea pedis infection.  There is no open lesions noted today. Remaining nails are hypertrophic, dystrophic with yellow to brown discoloration.  No edema, erythema or signs of infection. No open lesions or pre-ulcerative lesions.  No pain with calf compression, swelling, warmth, erythema  Assessment: 55 year old female uncontrolled diabetes with neuropathy; tinea pedis  Plan: -All treatment options discussed with the patient including all alternatives, risks, complications.  -Regards to the neuropathy and the worsening symptoms she is already on 300 mg of Lyrica.  We will discuss with her primary care physician regards to either changing the medication or referring to neurology. -Prescribed ketoconazole. -Sharply debrided the nails x9 without any complications or bleeding. -Discussed daily foot inspection as well as glucose control. -Patient encouraged to call the office with any questions, concerns, change in symptoms.   Christina Cameron DPM  Cc: Bonnita Hollow, MD

## 2021-03-10 ENCOUNTER — Other Ambulatory Visit: Payer: Self-pay

## 2021-03-10 ENCOUNTER — Encounter (HOSPITAL_COMMUNITY): Payer: Self-pay | Admitting: Obstetrics

## 2021-03-10 NOTE — Progress Notes (Addendum)
Mrs Christina Cameron denies chest pain or shortness of breath at this time.  Patient denies having any s/s of Covid in her household.  Patient denies any known exposure to Covid.   Mrs. Christina Cameron has a history of shortness of breath with much activity.  Mrs Christina Cameron has chest tightness with palpations - patient describes  chest pain as "tightness in chest and palpations".  Mrs Christina Cameron states she does become short of breath, patient states the discomfort last less than 5 minutes.  [name] reports that she experienced this discomfort 3-4 days ago , "sometimes it happens after I have exerted myself, sometimes it happens when I am sleeping."  [name] has sleep apnea, she can tolerate it about 1 hour at night, due to anxiety. Mrs Christina Cameron's cardiologist is Dr. Laban Emperor is aware of chest discomfort, it is document in cardiology notes that the pain and palpations are not as severe as they were with medication adjustments. Mrs Christina Cameron reported that chest discomfort is not as severe as in the past.  Mrs Christina Cameron has Type II diabetes, patient reports that CBG's have been running 186 -256. I instructed Mrs Christina Cameron to hold Wilder Glade today and in am, hold Metformin in am.  I instructed patient to check CBG after awaking and every 2 hours until arrival  to the hospital.  I Instructed patient if CBG is less than 70 to take 4 Glucose Tablets or 1 tube of Glucose Gel or 1/2 cup of a clear juice. Recheck CBG in 15 minutes if CBG is not over 70 call, pre- op desk at [phone] for further instructions.   I instructed patient to shower with antibiotic soap, if it is available.  Dry off with a clean towel. Do not put lotion, powder, cologne or deodorant or makeup.No jewelry or piercings. Men may shave their face and neck. Woman should not shave. No nail polish, artificial or acrylic nails. Wear clean clothes, brush your teeth. Glasses, contact lens,dentures or partials may not be worn in the OR. If you need to wear them, please bring a case for glasses, do not  wear contacts or bring a case, the hospital does not have contact cases, dentures or partials will have to be removed , make sure they are clean, we will provide a denture cup to put them in. You will need some one to drive you home and a responsible person over the age of 57 to stay with you for the first 24 hours after surgery.Marland Kitchen  PCP - Dr Josephine Igo - [facility],  I requested last office  visit and  labs.  Cardiologist Dr. Bronson Ing.

## 2021-03-10 NOTE — Anesthesia Preprocedure Evaluation (Addendum)
Anesthesia Evaluation  Patient identified by MRN, date of birth, ID band Patient awake    Reviewed: Allergy & Precautions, NPO status , Patient's Chart, lab work & pertinent test results  History of Anesthesia Complications (+) PONV and history of anesthetic complications  Airway Mallampati: II  TM Distance: >3 FB Neck ROM: Full    Dental  (+) Missing, Dental Advisory Given,    Pulmonary asthma , sleep apnea ,    breath sounds clear to auscultation       Cardiovascular hypertension, Pt. on medications and Pt. on home beta blockers +CHF  + dysrhythmias  Rhythm:Regular Rate:Normal     Neuro/Psych  Headaches, PSYCHIATRIC DISORDERS Anxiety    GI/Hepatic negative GI ROS, Neg liver ROS,   Endo/Other  diabetes, Type 2, Oral Hypoglycemic Agents  Renal/GU negative Renal ROS     Musculoskeletal  (+) Arthritis ,   Abdominal Normal abdominal exam  (+)   Peds  Hematology negative hematology ROS (+)   Anesthesia Other Findings   Reproductive/Obstetrics                           Anesthesia Physical Anesthesia Plan  ASA: 3  Anesthesia Plan: General   Post-op Pain Management:    Induction: Intravenous  PONV Risk Score and Plan: 4 or greater and Ondansetron, Dexamethasone, Midazolam and Scopolamine patch - Pre-op  Airway Management Planned: LMA  Additional Equipment: None  Intra-op Plan:   Post-operative Plan: Extubation in OR  Informed Consent: I have reviewed the patients History and Physical, chart, labs and discussed the procedure including the risks, benefits and alternatives for the proposed anesthesia with the patient or authorized representative who has indicated his/her understanding and acceptance.     Dental advisory given  Plan Discussed with: CRNA  Anesthesia Plan Comments: (See APP note by Durel Salts, FNP )      Anesthesia Quick Evaluation

## 2021-03-10 NOTE — Progress Notes (Signed)
Anesthesia Chart Review:  Pt is a same day work up     Case: [id number] Date/Time: 03/11/21 1145   Procedures:      DILATATION AND CURETTAGE /HYSTEROSCOPY     OPERATIVE ULTRASOUND - ultrasound guidance needed   Anesthesia type: General   Pre-op diagnosis: post menopausal bleeding   Location: [facility] OR ROOM 07 / [facility]   Surgeons: Jerelyn Charles, MD       DISCUSSION: Pt is 55 years old with hx palpitations, SVT (s/p ablation 2005), chest pain, HTN, OSA  Pt has longstanding history of chest pain, dating back to at least 2016.  Pt then underwent cardiac cath in 2016 for chest pain which showed normal coronaries.   Pt has long history of palpitations, notes indicate it's better now with diltiazem   PROVIDERS: - PCP is Bonnita Hollow, MD - Cardiologist was Kate Sable, MD who is no longer at that practice. Last office visit 10/22/20 with [name], NP - Pulmonologist is Chesley Mires, MD. Last virtual visit 10/18/20 with Geraldo Pitter, NP   LABS: Will be obtained day of surgery    EKG 07/30/20: NSR. LAD. Pulmonary disease pattern. Septal infarct, age undetermined.    CV: Echo 10/20/20:  1. Left ventricular ejection fraction, by estimation, is 60 to 65%. The left ventricle has normal function. The left ventricle has no regional wall motion abnormalities. There is mild left ventricular hypertrophy. Left ventricular diastolic parameters were normal. The average left ventricular global longitudinal strain is 18.1 %. The global longitudinal strain is normal.  2. Right ventricular systolic function is normal. The right ventricular size is normal.  3. The mitral valve is normal in structure. No evidence of mitral valve regurgitation. No evidence of mitral stenosis.  4. The aortic valve has an indeterminant number of cusps. There is mild calcification of the aortic valve. There is mild thickening of the aortic valve. Aortic valve regurgitation is not visualized. No aortic stenosis is  present.  5. The inferior vena cava is normal in size with greater than 50% respiratory variability, suggesting right atrial pressure of 3 mmHg.   Nuclear stress test 03/02/20:  There was no ST segment deviation noted during stress. The study is normal. There are no perfusion defects consistent with prior infarct or current ischemia. This is a low risk study. The left ventricular ejection fraction is normal (55-65%).    Cardiac monitor 09/23/19:  Sinus rhythm and sinus tachycardia with rare PACs and a very brief atrial run. There were no sustained arrhythmias.    R/L cardiac cath 12/14/14:  ANGIOGRAPHICALLY NORMAL CORONARY ARTERIES NORMAL INTRACARDIAC PRESSURES/HEMODYNAMICS   Past Medical History:  Diagnosis Date   Arthritis    right knee   Asthma    Cancer ([place])    cervical 1989   Chest pain    a. normal cors by cath in 11/2014   Chronic diastolic (congestive) heart failure (HCC)    Complication of anesthesia    COVID-17 June 2019, January 2022   Diabetes mellitus, type 2 ([place])    Dysrhythmia    Headache    High cholesterol    Hypertension    Morbid (severe) obesity due to excess calories (HCC)    Neuromuscular disorder (HCC)    Osteoarthritis of knee, unspecified    Palpitations    PONV (postoperative nausea and vomiting)    PTSD (post-traumatic stress disorder)    Raynaud's syndrome    Sleep apnea    SVT (supraventricular tachycardia) ([place])  a. s/p ablation by Dr. Lovena Le in 2006.    Past Surgical History:  Procedure Laterality Date   CARDIAC CATHETERIZATION     CARDIAC CATHETERIZATION N/A 12/14/2014   Procedure: Right/Left Heart Cath and Coronary Angiography;  Surgeon: Sherren Mocha, MD;  Location: [facility];  Service: Cardiovascular;  Laterality: N/A;   CARDIAC ELECTROPHYSIOLOGY STUDY AND ABLATION     CESAREAN SECTION     x2   ENDOMETRIAL ABLATION      MEDICATIONS: No current facility-administered medications for this encounter.     albuterol (VENTOLIN HFA) 108 (90 Base) MCG/ACT inhaler   ARIPiprazole (ABILIFY) 10 MG tablet   busPIRone (BUSPAR) 10 MG tablet   cetirizine (ZYRTEC) 10 MG tablet   diltiazem (CARDIZEM) 90 MG tablet   FARXIGA 10 MG TABS tablet   furosemide (LASIX) 20 MG tablet   indapamide (LOZOL) 2.5 MG tablet   irbesartan (AVAPRO) 300 MG tablet   ketoconazole (NIZORAL) 2 % cream   meloxicam (MOBIC) 15 MG tablet   metFORMIN (GLUCOPHAGE) 500 MG tablet   metoprolol tartrate (LOPRESSOR) 50 MG tablet   rosuvastatin (CRESTOR) 40 MG tablet   Semaglutide,0.25 or 0.5MG /DOS, (OZEMPIC, 0.25 OR 0.5 MG/DOSE,) 2 MG/1.5ML SOPN   sertraline (ZOLOFT) 100 MG tablet   traZODone (DESYREL) 50 MG tablet   sulfamethoxazole-trimethoprim (BACTRIM DS) 800-160 MG tablet   trimethoprim-polymyxin b (POLYTRIM) ophthalmic solution    If labs acceptable day of surgery, I anticipate pt can proceed with surgery as scheduled.  Willeen Cass, PhD, FNP-BC [facility]/Anesthesiology Phone: [phone] 03/10/2021 10:18 AM

## 2021-03-11 ENCOUNTER — Ambulatory Visit (HOSPITAL_COMMUNITY)
Admission: RE | Admit: 2021-03-11 | Discharge: 2021-03-11 | Disposition: A | Discharge status: Home / Self Care | Payer: Medicaid Other | Attending: Obstetrics | Admitting: Obstetrics

## 2021-03-11 ENCOUNTER — Ambulatory Visit (HOSPITAL_COMMUNITY): Payer: Medicaid Other | Admitting: Emergency Medicine

## 2021-03-11 ENCOUNTER — Other Ambulatory Visit: Payer: Self-pay

## 2021-03-11 ENCOUNTER — Encounter (HOSPITAL_COMMUNITY): Payer: Self-pay | Admitting: Obstetrics

## 2021-03-11 ENCOUNTER — Encounter (HOSPITAL_COMMUNITY)
Admission: RE | Disposition: A | Discharge status: Home / Self Care | Payer: Self-pay | Source: Home / Self Care | Attending: Obstetrics

## 2021-03-11 ENCOUNTER — Ambulatory Visit (HOSPITAL_COMMUNITY)
Admission: RE | Admit: 2021-03-11 | Discharge: 2021-03-11 | Disposition: A | Discharge status: Home / Self Care | Payer: Medicaid Other | Source: Ambulatory Visit | Attending: Obstetrics | Admitting: Obstetrics

## 2021-03-11 DIAGNOSIS — Z98891 History of uterine scar from previous surgery: Secondary | ICD-10-CM | POA: Insufficient documentation

## 2021-03-11 DIAGNOSIS — Z8541 Personal history of malignant neoplasm of cervix uteri: Secondary | ICD-10-CM | POA: Insufficient documentation

## 2021-03-11 DIAGNOSIS — N950 Postmenopausal bleeding: Secondary | ICD-10-CM | POA: Insufficient documentation

## 2021-03-11 DIAGNOSIS — Z8616 Personal history of COVID-19: Secondary | ICD-10-CM | POA: Diagnosis not present

## 2021-03-11 HISTORY — DX: Other specified postprocedural states: Z98.890

## 2021-03-11 HISTORY — DX: Nausea with vomiting, unspecified: R11.2

## 2021-03-11 HISTORY — DX: Post-traumatic stress disorder, unspecified: F43.10

## 2021-03-11 HISTORY — DX: Headache, unspecified: R51.9

## 2021-03-11 HISTORY — DX: Myoneural disorder, unspecified: G70.9

## 2021-03-11 HISTORY — PX: OPERATIVE ULTRASOUND: SHX5996

## 2021-03-11 HISTORY — DX: Sleep apnea, unspecified: G47.30

## 2021-03-11 HISTORY — DX: Cardiac arrhythmia, unspecified: I49.9

## 2021-03-11 HISTORY — PX: HYSTEROSCOPY WITH D & C: SHX1775

## 2021-03-11 HISTORY — DX: Other complications of anesthesia, initial encounter: T88.59XA

## 2021-03-11 LAB — BASIC METABOLIC PANEL
Anion gap: 13 (ref 5–15)
BUN: 16 mg/dL (ref 6–20)
CO2: 22 mmol/L (ref 22–32)
Calcium: 9.1 mg/dL (ref 8.9–10.3)
Chloride: 101 mmol/L (ref 98–111)
Creatinine, Ser: 0.69 mg/dL (ref 0.44–1.00)
GFR, Estimated: >60 mL/min (ref >60)
Glucose, Bld: 192 mg/dL — ABNORMAL HIGH (ref 70–99)
Potassium: 4.2 mmol/L (ref 3.5–5.1)
Sodium: 136 mmol/L (ref 135–145)

## 2021-03-11 LAB — CBC
HCT: 43.3 % (ref 36.0–46.0)
Hemoglobin: 14 g/dL (ref 12.0–15.0)
MCH: 29.5 pg (ref 26.0–34.0)
MCHC: 32.3 g/dL (ref 30.0–36.0)
MCV: 91.2 fL (ref 80.0–100.0)
Platelets: 266 10*3/uL (ref 150–400)
RBC: 4.75 MIL/uL (ref 3.87–5.11)
RDW: 12.8 % (ref 11.5–15.5)
WBC: 11.4 10*3/uL — ABNORMAL HIGH (ref 4.0–10.5)
nRBC: 0 % (ref 0.0–0.2)

## 2021-03-11 LAB — GLUCOSE, CAPILLARY
Glucose-Capillary: 227 mg/dL — ABNORMAL HIGH (ref 70–99)
Glucose-Capillary: 191 mg/dL — ABNORMAL HIGH (ref 70–99)

## 2021-03-11 SURGERY — DILATATION AND CURETTAGE /HYSTEROSCOPY
Anesthesia: General | Site: Uterus

## 2021-03-11 MED ORDER — ACETAMINOPHEN 10 MG/ML IV SOLN
INTRAVENOUS | Status: AC
  Filled 2021-03-11: qty 100

## 2021-03-11 MED ORDER — ACETAMINOPHEN 160 MG/5ML PO SOLN: 325.0000 mg | ORAL | Status: DC | PRN

## 2021-03-11 MED ORDER — CHLORHEXIDINE GLUCONATE 0.12 % MT SOLN
15.0000 mL | Freq: Once | OROMUCOSAL | Status: AC
  Administered 2021-03-11: 15 mL via OROMUCOSAL
  Filled 2021-03-11: qty 15

## 2021-03-11 MED ORDER — ONDANSETRON HCL 4 MG/2ML IJ SOLN
INTRAMUSCULAR | Status: DC | PRN
  Administered 2021-03-11: 4 mg via INTRAVENOUS

## 2021-03-11 MED ORDER — MIDAZOLAM HCL 2 MG/2ML IJ SOLN
INTRAMUSCULAR | Status: AC
  Filled 2021-03-11: qty 2

## 2021-03-11 MED ORDER — PROPOFOL 10 MG/ML IV BOLUS
INTRAVENOUS | Status: AC
  Filled 2021-03-11: qty 20

## 2021-03-11 MED ORDER — FENTANYL CITRATE (PF) 100 MCG/2ML IJ SOLN
25.0000 ug | INTRAMUSCULAR | Status: DC | PRN
  Administered 2021-03-11 (×2): 25 ug via INTRAVENOUS

## 2021-03-11 MED ORDER — AMISULPRIDE (ANTIEMETIC) 5 MG/2ML IV SOLN: 10.0000 mg | Freq: Once | INTRAVENOUS | Status: DC | PRN

## 2021-03-11 MED ORDER — ACETAMINOPHEN 10 MG/ML IV SOLN
1000.0000 mg | Freq: Once | INTRAVENOUS | Status: DC | PRN
  Administered 2021-03-11: 1000 mg via INTRAVENOUS

## 2021-03-11 MED ORDER — OXYCODONE HCL 5 MG/5ML PO SOLN: 5.0000 mg | Freq: Once | ORAL | Status: DC | PRN

## 2021-03-11 MED ORDER — FENTANYL CITRATE (PF) 100 MCG/2ML IJ SOLN
INTRAMUSCULAR | Status: AC
  Filled 2021-03-11: qty 2

## 2021-03-11 MED ORDER — OXYCODONE HCL 5 MG PO TABS
5.0000 mg | ORAL_TABLET | Freq: Once | ORAL | Status: DC | PRN
Start: 2021-03-11 — End: 2021-03-11

## 2021-03-11 MED ORDER — PHENYLEPHRINE 40 MCG/ML (10ML) SYRINGE FOR IV PUSH (FOR BLOOD PRESSURE SUPPORT)
PREFILLED_SYRINGE | INTRAVENOUS | Status: DC | PRN
  Administered 2021-03-11: 120 ug via INTRAVENOUS

## 2021-03-11 MED ORDER — LIDOCAINE HCL 1 % IJ SOLN
INTRAMUSCULAR | Status: DC | PRN
  Administered 2021-03-11: 10 mL

## 2021-03-11 MED ORDER — EPHEDRINE SULFATE 50 MG/ML IJ SOLN
INTRAMUSCULAR | Status: DC | PRN
  Administered 2021-03-11 (×2): 10 mg via INTRAVENOUS

## 2021-03-11 MED ORDER — LIDOCAINE HCL (PF) 1 % IJ SOLN
INTRAMUSCULAR | Status: AC
  Filled 2021-03-11: qty 30

## 2021-03-11 MED ORDER — SCOPOLAMINE 1 MG/3DAYS TD PT72
MEDICATED_PATCH | TRANSDERMAL | Status: AC
  Filled 2021-03-11: qty 1

## 2021-03-11 MED ORDER — SILVER NITRATE-POT NITRATE 75-25 % EX MISC
CUTANEOUS | Status: AC
  Filled 2021-03-11: qty 10

## 2021-03-11 MED ORDER — LIDOCAINE 2% (20 MG/ML) 5 ML SYRINGE
INTRAMUSCULAR | Status: DC | PRN
  Administered 2021-03-11: 50 mg via INTRAVENOUS

## 2021-03-11 MED ORDER — LACTATED RINGERS IV SOLN: INTRAVENOUS | Status: DC

## 2021-03-11 MED ORDER — DEXAMETHASONE SODIUM PHOSPHATE 10 MG/ML IJ SOLN
INTRAMUSCULAR | Status: DC | PRN
  Administered 2021-03-11: 5 mg via INTRAVENOUS

## 2021-03-11 MED ORDER — FENTANYL CITRATE (PF) 250 MCG/5ML IJ SOLN
INTRAMUSCULAR | Status: AC
  Filled 2021-03-11: qty 5

## 2021-03-11 MED ORDER — FENTANYL CITRATE (PF) 250 MCG/5ML IJ SOLN
INTRAMUSCULAR | Status: DC | PRN
  Administered 2021-03-11: 100 ug via INTRAVENOUS

## 2021-03-11 MED ORDER — PROMETHAZINE HCL 25 MG/ML IJ SOLN: 6.2500 mg | INTRAMUSCULAR | Status: DC | PRN

## 2021-03-11 MED ORDER — PROPOFOL 10 MG/ML IV BOLUS
INTRAVENOUS | Status: DC | PRN
  Administered 2021-03-11: 200 mg via INTRAVENOUS

## 2021-03-11 MED ORDER — MIDAZOLAM HCL 5 MG/5ML IJ SOLN
INTRAMUSCULAR | Status: DC | PRN
  Administered 2021-03-11: 2 mg via INTRAVENOUS

## 2021-03-11 MED ORDER — ORAL CARE MOUTH RINSE: 15.0000 mL | Freq: Once | OROMUCOSAL | Status: AC

## 2021-03-11 MED ORDER — SODIUM CHLORIDE 0.9 % IR SOLN
Status: DC | PRN
  Administered 2021-03-11: 2200 mL

## 2021-03-11 MED ORDER — SCOPOLAMINE 1 MG/3DAYS TD PT72
MEDICATED_PATCH | TRANSDERMAL | Status: DC | PRN
  Administered 2021-03-11: 1 via TRANSDERMAL

## 2021-03-11 MED ORDER — ACETAMINOPHEN 325 MG PO TABS: 325.0000 mg | ORAL_TABLET | ORAL | Status: DC | PRN

## 2021-03-11 SURGICAL SUPPLY — 12 items
CATH ROBINSON RED A/P 16FR (CATHETERS) ×2 IMPLANT
GLOVE SURG LTX SZ6 (GLOVE) ×2 IMPLANT
GLOVE SURG UNDER POLY LF SZ6.5 (GLOVE) ×2 IMPLANT
GLOVE SURG UNDER POLY LF SZ7 (GLOVE) ×2 IMPLANT
GOWN STRL REUS W/ TWL LRG LVL3 (GOWN DISPOSABLE) ×2 IMPLANT
GOWN STRL REUS W/TWL LRG LVL3 (GOWN DISPOSABLE) ×4
KIT PROCEDURE FLUENT (KITS) ×2 IMPLANT
KIT TURNOVER KIT B (KITS) ×2 IMPLANT
PACK VAGINAL MINOR WOMEN LF (CUSTOM PROCEDURE TRAY) ×2 IMPLANT
PAD OB MATERNITY 4.3X12.25 (PERSONAL CARE ITEMS) ×2 IMPLANT
SEAL ROD LENS SCOPE MYOSURE (ABLATOR) ×1 IMPLANT
TOWEL GREEN STERILE FF (TOWEL DISPOSABLE) ×4 IMPLANT

## 2021-03-11 NOTE — Anesthesia Postprocedure Evaluation (Signed)
Anesthesia Post Note  Patient: TAHJANAE BLANKENBURG  Procedure(s) Performed: DILATATION AND CURETTAGE /HYSTEROSCOPY (Uterus) OPERATIVE ULTRASOUND (Uterus)     Patient location during evaluation: PACU Anesthesia Type: General Level of consciousness: awake and alert Pain management: pain level controlled Vital Signs Assessment: post-procedure vital signs reviewed and stable Respiratory status: spontaneous breathing, nonlabored ventilation, respiratory function stable and patient connected to nasal cannula oxygen Cardiovascular status: blood pressure returned to baseline and stable Postop Assessment: no apparent nausea or vomiting Anesthetic complications: no   No notable events documented.  Last Vitals:  Vitals:   03/11/21 1503 03/11/21 1518  BP:  118/63  Pulse: 69 69  Resp: 14 15  Temp:  36.7 C  SpO2: 93% 94%    Last Pain:  Vitals:   03/11/21 1503  TempSrc:   PainSc: 2                  Effie Berkshire

## 2021-03-11 NOTE — Progress Notes (Signed)
CBG on arrival was 226. Dr. Smith Robert aware.

## 2021-03-11 NOTE — Transfer of Care (Signed)
Immediate Anesthesia Transfer of Care Note  Patient: Christina Cameron  Procedure(s) Performed: DILATATION AND CURETTAGE /HYSTEROSCOPY (Uterus) OPERATIVE ULTRASOUND (Uterus)  Patient Location: PACU  Anesthesia Type:General  Level of Consciousness: drowsy  Airway & Oxygen Therapy: Patient Spontanous Breathing and Patient connected to nasal cannula oxygen  Post-op Assessment: Report given to RN and Post -op Vital signs reviewed and stable  Post vital signs: Reviewed and stable  Last Vitals:  Vitals Value Taken Time  BP 107/57 03/11/21 1325  Temp 37.1 C 03/11/21 1325  Pulse 72 03/11/21 1326  Resp 16 03/11/21 1326  SpO2 99 % 03/11/21 1326  Vitals shown include unvalidated device data.  Last Pain:  Vitals:   03/11/21 1024  TempSrc:   PainSc: 4       Patients Stated Pain Goal: 2 (32/67/12 4580)  Complications: No notable events documented.

## 2021-03-11 NOTE — Op Note (Signed)
Operative Note  Pre-operative Diagnosis: recurrent post menopausal bleeding  Post-operative Diagnosis: recurrent post menopausal bleeding  Surgeon: Jerelyn Charles, MD  Procedure: hysteroscopy, dilation and curettage under ultrasound guidance.    Anesthesia: general  Estimated Blood Loss: minimal         Specimens: endometrial curettings          Findings: Anteverted uterus.  Challenging dilation due to cervical stenosis despite ultrasound guidance. Endometrial cavity poorly visualized, tubal ostia never identified.   Description of Procedure:         After adequate anesthesia was achieved, the patient placed in the dorsal lithotomy position in [place].  She was prepped and draped in the usual sterile fashion.  The bivalve speculum was placed in the vagina.  Due to the patient's anatomy and body habitus, a long speculum was required. The pedersen speculum was too narrow to full retract the vaginal sidewalls.  A large graves speculum was required due to body habitus but unable to be appropriately procured by the operating team.  An extra long graves was placed in the vagina and the anterior lip of the cervix grasped with a single-tooth tenaculum.  The cervix was serially dilated with Hank dilators to 11mm under ultrasound guidance.  Given the extreme length of the speculum, hysteroscopy was very challenging as the hysteroscope hubbed on the speculum prior to passing through the internal os for visualization of the cavity.  Multiple attempts were made with various retractors and speculums all with great difficulty.  Under direct visualization, the hysteroscope was advanced. With ultrasound guidance, the tip of the camera appeared to pass through the internal os.  The view of the endometrial cavity was limited.  The tubal ostia were never identified and while the hysteroscope appeared to be within the endometrial canal on ultrasound, a clear view of the cavity was never obtained.  The  hysteroscope was removed and a sharp curettage was performed again under ultrasound guidance with minimal return of tissue.  The endometrial curettings were sent to pathology.  All the vaginal instruments were removed.  Counts were correct.  The patient was awakened from anesthesia and transferred to PACU in stable condition.    [place], [facility]

## 2021-03-11 NOTE — Anesthesia Procedure Notes (Signed)
Procedure Name: LMA Insertion Date/Time: 03/11/2021 12:04 PM Performed by: Imagene Riches, CRNA Pre-anesthesia Checklist: Patient identified, Emergency Drugs available, Suction available and Patient being monitored Patient Re-evaluated:Patient Re-evaluated prior to induction Oxygen Delivery Method: Circle System Utilized Preoxygenation: Pre-oxygenation with 100% oxygen Induction Type: IV induction Ventilation: Mask ventilation without difficulty LMA: LMA inserted LMA Size: 4.0 Number of attempts: 1 Airway Equipment and Method: Bite block Placement Confirmation: positive ETCO2 Tube secured with: Tape Dental Injury: Teeth and Oropharynx as per pre-operative assessment

## 2021-03-11 NOTE — H&P (Signed)
55 y.o. [id number] presents for hysteroscopy, D&C for postmenopausal bleeding.  She has a history of endometrial ablation in 2012 that resulted in amenorrhea.  She presented with intermittent spotting since June 2022. Exact age of menopausal difficult to determine given prior ablation [facility] obtained and was 33  Pelvic US: Anteverted uterus 6 x 6.4 x 4.76cm . Multiple small fibroids, largest measuring 2.3 cm. Endometrial stripe measured at 3.34mm, but partially obscured by shadowing of fibroids. Ovaries not visualized due to excessive bowel gas.  Given poor imaging of the endometrial stripe and recurrent nature of bleeding, decision was made to proceed with endometrial biopsy.  Unable to obtain EMB in office due to cervical stenosis of internal os, despite pretreatment with misoprostol   Past Medical History:  Diagnosis Date   Arthritis    right knee   Asthma    Cancer ([place])    cervical 1989   Chest pain    a. normal cors by cath in 11/2014   Chronic diastolic (congestive) heart failure ([place])    Complication of anesthesia    COVID-17 June 2019, January 2022   Diabetes mellitus, type 2 ([place])    Dysrhythmia    Headache    High cholesterol    Hypertension    Morbid (severe) obesity due to excess calories (HCC)    Neuromuscular disorder (HCC)    Osteoarthritis of knee, unspecified    Palpitations    PONV (postoperative nausea and vomiting)    PTSD (post-traumatic stress disorder)    Raynaud's syndrome    Sleep apnea    SVT (supraventricular tachycardia) ([place])    a. s/p ablation by Dr. Lovena Le in 2006.    Past Surgical History:  Procedure Laterality Date   CARDIAC CATHETERIZATION     CARDIAC CATHETERIZATION N/A 12/14/2014   Procedure: Right/Left Heart Cath and Coronary Angiography;  Surgeon: Sherren Mocha, MD;  Location: [facility];  Service: Cardiovascular;  Laterality: N/A;   CARDIAC ELECTROPHYSIOLOGY STUDY AND ABLATION     CESAREAN SECTION     x2   ENDOMETRIAL ABLATION       OB History  Gravida Para Term Preterm AB Living  3 2 2   1 2   SAB IAB Ectopic Multiple Live Births  1            # Outcome Date GA Lbr Len/2nd Weight Sex Delivery Anes PTL Lv  3 SAB           2 Term           1 Term             Social History   Socioeconomic History   Marital status: Married    Spouse name: Not on file   Number of children: Not on file   Years of education: Not on file   Highest education level: Not on file  Occupational History   Not on file  Tobacco Use   Smoking status: Never   Smokeless tobacco: Never  Vaping Use   Vaping Use: Never used  Substance and Sexual Activity   Alcohol use: Not Currently    Comment: seldom   Drug use: No   Sexual activity: Yes    Birth control/protection: None  Other Topics Concern   Not on file  Social History Narrative   Not on file   Social Determinants of Health   Financial Resource Strain: Not on file  Food Insecurity: Not on file  Transportation Needs: Not on file  Physical  Activity: Not on file  Stress: Not on file  Social Connections: Not on file  Intimate Partner Violence: Not on file   Patient has no known allergies.    Vitals:   03/11/21 0947  BP: (!) 144/77  Pulse: 76  Resp: 19  Temp: 99.3 F (37.4 C)  SpO2: 97%     General:  NAD Abdomen:  soft Pelvic: normal cervix, stenosis of internal os.  Uterine exam / mobility limited due to habitus    A/P   55 y.o.  [id number]  presents for hysteroscopy, dilation and curettage for recurrent postmenopausal bleeding.  Given recurrent nature of bleeding and risk factor for endometrial cancer (BMI >40)  Desires to proceed with diagnostic hysteroscopy, D&C, possible polypectomy if polyp identified. She has been pretreated with misoprostol yesterday evening and this morning for cervical stenosis.  Planned procedure under ultrasound guidance to optimize successful entry into endometrial cavity. Discussed risks to include infection, bleeding, damage to  surrounding structures (including but not limited to vagina, cervix, bladder, uterus), uterine perforation, inability to enter uterine cavity or fully resect polyp or not find a polyp, possible recurrence of polyp and abnormal uterine bleeding, need for additional procedures.  All questions answered and patient elects to proceed.   [place]

## 2021-03-12 ENCOUNTER — Encounter (HOSPITAL_COMMUNITY): Payer: Self-pay | Admitting: Obstetrics

## 2021-03-14 LAB — SURGICAL PATHOLOGY

## 2021-03-24 DIAGNOSIS — M79676 Pain in unspecified toe(s): Secondary | ICD-10-CM

## 2021-04-12 ENCOUNTER — Telehealth: Payer: Medicaid Other | Admitting: Family

## 2021-04-12 DIAGNOSIS — R2689 Other abnormalities of gait and mobility: Secondary | ICD-10-CM

## 2021-04-12 DIAGNOSIS — H539 Unspecified visual disturbance: Secondary | ICD-10-CM

## 2021-04-12 NOTE — Progress Notes (Signed)
Based on what you shared with me, I feel your condition warrants further evaluation and I recommend that you be seen in a face to face visit.  Given changes in your vision and balance you need to be seen in person now for further investigation to rule out a more serious problem.    NOTE: There will be NO CHARGE for this eVisit   If you are having a true medical emergency please call 911.      For an urgent face to face visit, [facility] has six urgent care centers for your convenience:     [facility] [place] Get Driving Directions [phone] [place], [place] [address]    [facility]) Get Driving Directions [phone] [place], [place] [address]  [facility] [place] ([place]) Get Driving Directions [phone] [address] [place],  [place]  [address]  [facility] Get Driving Directions [phone] [place], [place], [place] [address]   [facility] Get Driving Directions  [phone] [address] [place], [place] [address]   [facility] [place] Get Driving Directions [phone] [address], [place], [place] [address]  Your MyChart E-visit questionnaire answers were reviewed by a board certified advanced clinical practitioner to complete your personal care plan based on your specific symptoms.  Thank you for using e-Visits.

## 2021-04-14 ENCOUNTER — Telehealth: Payer: Self-pay | Admitting: *Deleted

## 2021-04-14 NOTE — Telephone Encounter (Signed)
Patient Is calling because her feet are  still hurting, would like to be referred to a Neuologist specialist as mentioned during last office visit.Please advise.

## 2021-04-15 ENCOUNTER — Other Ambulatory Visit: Payer: Self-pay | Admitting: Podiatry

## 2021-04-15 DIAGNOSIS — G629 Polyneuropathy, unspecified: Secondary | ICD-10-CM

## 2021-04-18 ENCOUNTER — Other Ambulatory Visit: Payer: Self-pay | Admitting: Podiatry

## 2021-04-18 NOTE — Telephone Encounter (Signed)
Called [facility], spoke with Burman Nieves, stated that they will call patient to scheduled appointment today.Patient has been notified.

## 2021-04-19 NOTE — Telephone Encounter (Signed)
Please advise, patient has an upcoming appointment 05/26/21

## 2021-05-04 ENCOUNTER — Other Ambulatory Visit: Payer: Self-pay

## 2021-05-04 ENCOUNTER — Ambulatory Visit
Admission: RE | Admit: 2021-05-04 | Discharge: 2021-05-04 | Disposition: A | Discharge status: Home / Self Care | Payer: Medicaid Other | Source: Ambulatory Visit | Attending: Family Medicine | Admitting: Family Medicine

## 2021-05-04 VITALS — BP 137/83 | HR 82 | Temp 98.7°F | Resp 18

## 2021-05-04 DIAGNOSIS — M25562 Pain in left knee: Secondary | ICD-10-CM | POA: Diagnosis not present

## 2021-05-04 DIAGNOSIS — H9312 Tinnitus, left ear: Secondary | ICD-10-CM

## 2021-05-04 DIAGNOSIS — R42 Dizziness and giddiness: Secondary | ICD-10-CM | POA: Diagnosis not present

## 2021-05-04 DIAGNOSIS — J3489 Other specified disorders of nose and nasal sinuses: Secondary | ICD-10-CM

## 2021-05-04 LAB — POCT FASTING CBG KUC MANUAL ENTRY: POCT Glucose (KUC): 142 mg/dL — AB (ref 70–99)

## 2021-05-04 MED ORDER — FLUTICASONE PROPIONATE 50 MCG/ACT NA SUSP: 1.0000 | Freq: Two times a day (BID) | NASAL | 2 refills | Status: DC

## 2021-05-04 NOTE — ED Triage Notes (Signed)
Left ear buzzing and headache x 1 week  Feels dizzy.  Throbbing in left leg, left side flank pain, and states she feels short of breath and feels nauseated that started over the weekend.

## 2021-05-04 NOTE — ED Provider Notes (Signed)
[facility]    CSN: [id number] Arrival date & time: 05/04/21  0850      History   Chief Complaint No chief complaint on file.   HPI Christina Cameron is a 55 y.o. female.   Presenting today with 1 week history of left-sided sinus headache, left ear ringing, intermittent episodes of feeling off balance.  She denies visual changes, speech or swallowing changes, mental status changes, extremity numbness, tingling, weakness associated.  She states she thought she might be developing a sinus infection so she has been taking cold and sinus medications with minimal relief of symptoms.  She is also having some left lateral knee pain, left-sided flank soreness and intermittent mild episodes of feeling some chest tightness in this area for the past few days.  She has chronic arthritis in her knees bilaterally and is awaiting knee replacements, follows up with her orthopedist first thing next week for this issue.  She has a history of asthma and allergies as well as CHF which are all typically controlled on her chronic medications which she is compliant with.  She denies chest pain, significant shortness of breath, syncope, palpitations.   Past Medical History:  Diagnosis Date   Arthritis    right knee   Asthma    Cancer ([place])    cervical 1989   Chest pain    a. normal cors by cath in 11/2014   Chronic diastolic (congestive) heart failure (HCC)    Complication of anesthesia    COVID-17 June 2019, January 2022   Diabetes mellitus, type 2 ([place])    Dysrhythmia    Headache    High cholesterol    Hypertension    Morbid (severe) obesity due to excess calories (HCC)    Neuromuscular disorder (HCC)    Osteoarthritis of knee, unspecified    Palpitations    PONV (postoperative nausea and vomiting)    PTSD (post-traumatic stress disorder)    Raynaud's syndrome    Sleep apnea    SVT (supraventricular tachycardia) ([place])    a. s/p ablation by Dr. Lovena Le in 2006.    Patient Active  Problem List   Diagnosis Date Noted   Paroxysmal SVT (supraventricular tachycardia) (HCC) 09/17/2019   Upper airway cough syndrome vs atypical asthma/ cough variant  09/17/2019   Anxiety 09/15/2019   Essential hypertension, benign 05/07/2019   Acute bronchitis, unspecified    Chronic diastolic (congestive) heart failure ([place])    Mixed hyperlipidemia    Morbid (severe) obesity due to excess calories (HCC)    Osteoarthritis of knee, unspecified    Palpitations    Somnolence    Type 2 diabetes mellitus with hyperglycemia ([place])    Unspecified asthma, uncomplicated    Raynaud's syndrome    DOE (dyspnea on exertion)    Chest pain 09/08/2014   Heart palpitations 08/04/2014   DM (diabetes mellitus) ([place]) 08/04/2014    Past Surgical History:  Procedure Laterality Date   CARDIAC CATHETERIZATION     CARDIAC CATHETERIZATION N/A 12/14/2014   Procedure: Right/Left Heart Cath and Coronary Angiography;  Surgeon: Sherren Mocha, MD;  Location: [facility];  Service: Cardiovascular;  Laterality: N/A;   CARDIAC ELECTROPHYSIOLOGY STUDY AND ABLATION     CESAREAN SECTION     x2   ENDOMETRIAL ABLATION     HYSTEROSCOPY WITH D & C N/A 03/11/2021   Procedure: DILATATION AND CURETTAGE /HYSTEROSCOPY;  Surgeon: Jerelyn Charles, MD;  Location: [facility];  Service: Gynecology;  Laterality: N/A;   OPERATIVE  ULTRASOUND N/A 03/11/2021   Procedure: OPERATIVE ULTRASOUND;  Surgeon: Jerelyn Charles, MD;  Location: [facility];  Service: Gynecology;  Laterality: N/A;  ultrasound guidance needed    OB History     Gravida  3   Para  2   Term  2   Preterm      AB  1   Living  2      SAB  1   IAB      Ectopic      Multiple      Live Births               Home Medications    Prior to Admission medications   Medication Sig Start Date End Date Taking? Authorizing Provider  fluticasone (FLONASE) 50 MCG/ACT nasal spray Place 1 spray into both nostrils 2 (two) times daily. 05/04/21  Yes Volney American, PA-C  albuterol (VENTOLIN HFA) 108 (90 Base) MCG/ACT inhaler Inhale 1-2 puffs into the lungs every 6 (six) hours as needed for wheezing or shortness of breath. 05/02/20   Avegno, Darrelyn Hillock, FNP  ARIPiprazole (ABILIFY) 10 MG tablet Take 10 mg by mouth daily. 09/01/20   [provider]  busPIRone (BUSPAR) 10 MG tablet Take 10 mg by mouth 2 (two) times daily. 10/19/20   [provider]  cetirizine (ZYRTEC) 10 MG tablet Take 10 mg by mouth daily as needed for allergies.    [provider]  diltiazem (CARDIZEM) 90 MG tablet Take 1 tablet (90 mg total) by mouth 2 (two) times daily. 10/22/20   Verta Ellen., NP  FARXIGA 10 MG TABS tablet Take 10 mg by mouth daily. 04/30/20   [provider]  furosemide (LASIX) 20 MG tablet Take 1 tablet (20 mg total) by mouth daily as needed for fluid. 09/15/19   Corum, Rex Kras, MD  indapamide (LOZOL) 2.5 MG tablet Take 2.5 mg by mouth daily as needed (fluid). 12/07/20   [provider]  irbesartan (AVAPRO) 300 MG tablet Take 300 mg by mouth daily. 04/30/20   [provider]  ketoconazole (NIZORAL) 2 % cream APPLY CREAM TOPICALLY DAILY 04/19/21   Trula Slade, DPM  meloxicam (MOBIC) 15 MG tablet Take 15 mg by mouth daily as needed for pain. 11/28/19   [provider]  metFORMIN (GLUCOPHAGE) 500 MG tablet Take 1,000 mg by mouth 2 (two) times daily with a meal.    [provider]  metoprolol tartrate (LOPRESSOR) 50 MG tablet Take 50 mg by mouth 2 (two) times daily.    [provider]  rosuvastatin (CRESTOR) 40 MG tablet Take 40 mg by mouth daily.    [provider]  Semaglutide,0.25 or 0.5MG /DOS, (OZEMPIC, 0.25 OR 0.5 MG/DOSE,) 2 MG/1.5ML SOPN Inject 0.5 mg into the skin every Friday.    [provider]  sertraline (ZOLOFT) 100 MG tablet Take 150 mg by mouth daily. 09/28/20   [provider]  sulfamethoxazole-trimethoprim (BACTRIM DS) 800-160 MG tablet Take 1  tablet by mouth 2 (two) times daily. Patient not taking: Reported on 03/07/2021 02/22/21   Mar Daring, PA-C  traZODone (DESYREL) 50 MG tablet Take 50 mg by mouth at bedtime as needed for sleep.    [provider]    Family History Family History  Problem Relation Age of Onset   Cancer Mother    Cancer Father    Stroke Brother    Hypertension Brother     Social History Social History  Tobacco Use   Smoking status: Never   Smokeless tobacco: Never  Vaping Use   Vaping Use: Never used  Substance Use Topics   Alcohol use: Not Currently    Comment: seldom   Drug use: No     Allergies   Patient has no known allergies.   Review of Systems Review of Systems Per HPI  Physical Exam Triage Vital Signs ED Triage Vitals  Enc Vitals Group     BP 05/04/21 0916 137/83     Pulse Rate 05/04/21 0916 82     Resp 05/04/21 0916 18     Temp 05/04/21 0916 98.7 F (37.1 C)     Temp Source 05/04/21 0916 Oral     SpO2 05/04/21 0916 97 %     Weight --      Height --      Head Circumference --      Peak Flow --      Pain Score 05/04/21 0920 6     Pain Loc --      Pain Edu? --      Excl. in [place]? --    No data found.  Updated Vital Signs BP 137/83 (BP Location: Right Arm)   Pulse 82   Temp 98.7 F (37.1 C) (Oral)   Resp 18   SpO2 97%   Visual Acuity Right Eye Distance:   Left Eye Distance:   Bilateral Distance:    Right Eye Near:   Left Eye Near:    Bilateral Near:     Physical Exam Vitals and nursing note reviewed.  Constitutional:      Appearance: Normal appearance. She is not ill-appearing.  HENT:     Head: Atraumatic.     Ears:     Comments: Bilateral middle ear effusion    Nose: Rhinorrhea present.     Mouth/Throat:     Mouth: Mucous membranes are moist.  Eyes:     Extraocular Movements: Extraocular movements intact.     Conjunctiva/sclera: Conjunctivae normal.  Cardiovascular:     Rate and Rhythm: Normal rate and regular rhythm.      Heart sounds: Normal heart sounds.  Pulmonary:     Effort: Pulmonary effort is normal. No respiratory distress.     Breath sounds: Normal breath sounds. No wheezing or rales.  Musculoskeletal:        General: Swelling and tenderness present. Normal range of motion.     Cervical back: Normal range of motion and neck supple.     Comments: Localized tenderness to palpation with minimal edema left lateral knee joint line.  No posterior knee or lower leg tenderness to palpation, negative Homans' sign, negative squeeze test, no erythema.  Range of motion full and intact.  Gait normal  Skin:    General: Skin is warm and dry.     Findings: No erythema.  Neurological:     General: No focal deficit present.     Mental Status: She is alert and oriented to person, place, and time.     Cranial Nerves: No cranial nerve deficit.     Motor: No weakness.     Comments: Extremities neurovascularly intact diffusely  Psychiatric:        Mood and Affect: Mood normal.        Thought Content: Thought content normal.        Judgment: Judgment normal.     UC Treatments / Results  Labs (all labs ordered are listed, but only abnormal results are  displayed) Labs Reviewed  POCT FASTING [facility]G [facility] MANUAL ENTRY - Abnormal; Notable for the following components:      Result Value   POCT Glucose ([facility]) 142 (*)    All other components within normal limits    EKG   Radiology No results found.  Procedures Procedures (including critical care time)  Medications Ordered in UC Medications - No data to display  Initial Impression / Assessment and Plan / UC Course  I have reviewed the triage vital signs and the nursing notes.  Pertinent labs & imaging results that were available during my care of the patient were reviewed by me and considered in my medical decision making (see chart for details).  Did discuss with patient that we are unable to rule out life-threatening causes of some of her symptoms to include  heart attack, stroke, PE, DVT.  She is understanding of this and wishes to continue evaluation in this setting.  Her exam and vital signs today are very reassuring and overall benign.  Do suspect that her ringing ears, mild dizziness are related to eustachian tube dysfunction likely from sinus congestion, allergic rhinitis.  We will add Flonase to Zyrtec regimen and discussed Coricidin and other supportive over-the-counter medications for symptomatic benefit.  Her knee pain is lateral and along the joint line, suspect this is a flare of her chronic arthritis over a DVT which I have very low suspicion for.  Her oxygen saturation is 97% on room air today and her heart rate is normal at 82 bpm.  She declines an EKG or chest x-ray today which is reasonable with shared decision making.  She also declines blood work apart from a point-of-care glucose which was run today for safety check as she states her blood sugars have been running very high lately and she has not checked in several days.  Her blood sugar was 142 today, reassurance given in this.  ED precautions given for any acutely worsening symptoms.  Close PCP follow-up recommended as soon as possible.    Final Clinical Impressions(s) / UC Diagnoses   Final diagnoses:  Tinnitus of left ear  Dizziness  Acute pain of left knee  Rhinorrhea     Discharge Instructions      As we discussed during your visit, we are unable to provide advanced imaging today to rule out things that can be life-threatening such as blood clots, strokes, heart attacks but overall your vital signs and exam were reassuring today.  Go to the emergency room immediately if your symptoms worsen at any time.  Follow-up with your primary care provider soon as possible to recheck your symptoms.  I have sent in some Flonase and it appears you already take Zyrtec which may help with the eustachian tube inflammation that I believed to be causing your ringing in ears, feeling off balance  sensation.  Continue taking your regular medications as prescribed.    ED Prescriptions     Medication Sig Dispense Auth. Provider   fluticasone (FLONASE) 50 MCG/ACT nasal spray Place 1 spray into both nostrils 2 (two) times daily. 16 g Volney American, [place]      PDMP not reviewed this encounter.   Volney American, [place] 05/04/21 1016

## 2021-05-04 NOTE — Discharge Instructions (Signed)
As we discussed during your visit, we are unable to provide advanced imaging today to rule out things that can be life-threatening such as blood clots, strokes, heart attacks but overall your vital signs and exam were reassuring today.  Go to the emergency room immediately if your symptoms worsen at any time.  Follow-up with your primary care provider soon as possible to recheck your symptoms.  I have sent in some Flonase and it appears you already take Zyrtec which may help with the eustachian tube inflammation that I believed to be causing your ringing in ears, feeling off balance sensation.  Continue taking your regular medications as prescribed.

## 2021-05-16 ENCOUNTER — Telehealth: Payer: Medicaid Other | Admitting: Physician Assistant

## 2021-05-16 DIAGNOSIS — J019 Acute sinusitis, unspecified: Secondary | ICD-10-CM | POA: Diagnosis not present

## 2021-05-16 DIAGNOSIS — B9689 Other specified bacterial agents as the cause of diseases classified elsewhere: Secondary | ICD-10-CM

## 2021-05-16 MED ORDER — AMOXICILLIN-POT CLAVULANATE 875-125 MG PO TABS: 1.0000 | ORAL_TABLET | Freq: Two times a day (BID) | ORAL | 0 refills | Status: DC

## 2021-05-16 NOTE — Progress Notes (Signed)

## 2021-05-26 ENCOUNTER — Ambulatory Visit: Payer: Medicaid Other | Admitting: Podiatry

## 2021-06-14 ENCOUNTER — Ambulatory Visit: Payer: Medicaid Other | Admitting: Podiatry

## 2021-06-15 ENCOUNTER — Other Ambulatory Visit: Payer: Self-pay | Admitting: Obstetrics and Gynecology

## 2021-06-15 DIAGNOSIS — Z01818 Encounter for other preprocedural examination: Secondary | ICD-10-CM

## 2021-06-22 ENCOUNTER — Ambulatory Visit: Payer: Medicaid Other | Admitting: Diagnostic Neuroimaging

## 2021-06-27 ENCOUNTER — Ambulatory Visit: Payer: Medicaid Other | Admitting: Podiatry

## 2021-07-06 ENCOUNTER — Telehealth: Payer: Self-pay | Admitting: Internal Medicine

## 2021-07-06 NOTE — Telephone Encounter (Signed)
Received message thru My Chart from patient requesting an appointments for More palpitations fluid retention . Was being seen in [place] office by Marcine Matar, np. Patient has been scheduled for 07-28-2021 in the [facility].

## 2021-07-06 NOTE — Telephone Encounter (Signed)
Aware of appointment scheduled for 07/28/2021 at [facility] office. Says she is okay to wait until that time. Advised she could contact her PCP for an evaluation. Advised that she has been added to wait list.  Verbalized understanding

## 2021-07-28 ENCOUNTER — Telehealth: Payer: Medicaid Other | Admitting: Family Medicine

## 2021-07-28 ENCOUNTER — Ambulatory Visit: Payer: Medicaid Other | Admitting: Internal Medicine

## 2021-07-28 DIAGNOSIS — J019 Acute sinusitis, unspecified: Secondary | ICD-10-CM

## 2021-07-28 DIAGNOSIS — B9789 Other viral agents as the cause of diseases classified elsewhere: Secondary | ICD-10-CM

## 2021-07-28 MED ORDER — AZELASTINE-FLUTICASONE 137-50 MCG/ACT NA SUSP: 1.0000 | Freq: Two times a day (BID) | NASAL | 0 refills | Status: DC

## 2021-07-28 NOTE — Progress Notes (Signed)
E-Visit for Sinus Problems ? ?We are sorry that you are not feeling well.  Here is how we plan to help! ? ?Based on what you have shared with me it looks like you have sinusitis.  Sinusitis is inflammation and infection in the sinus cavities of the head.  Based on your presentation I believe you most likely have Acute Viral Sinusitis.This is an infection most likely caused by a virus. There is not specific treatment for viral sinusitis other than to help you with the symptoms until the infection runs its course.  You may use an oral decongestant such as Mucinex D or if you have glaucoma or high blood pressure use plain Mucinex. Saline nasal spray help and can safely be used as often as needed for congestion, I have prescribed: Flonase combination spray with Astelin.  Do not take the flonase and this flonase spray, only use this one for now. ? ?Please follow up with PCP to get referral to ENT for era ringing. ? ?Some authorities believe that zinc sprays or the use of Echinacea may shorten the course of your symptoms. ? ?Sinus infections are not as easily transmitted as other respiratory infection, however we still recommend that you avoid close contact with loved ones, especially the very young and elderly.  Remember to wash your hands thoroughly throughout the day as this is the number one way to prevent the spread of infection! ? ?Home Care: ?Only take medications as instructed by your medical team. ?Do not take these medications with alcohol. ?A steam or ultrasonic humidifier can help congestion.  You can place a towel over your head and breathe in the steam from hot water coming from a faucet. ?Avoid close contacts especially the very young and the elderly. ?Cover your mouth when you cough or sneeze. ?Always remember to wash your hands. ? ?Get Help Right Away If: ?You develop worsening fever or sinus pain. ?You develop a severe head ache or visual changes. ?Your symptoms persist after you have completed your  treatment plan. ? ?Make sure you ?Understand these instructions. ?Will watch your condition. ?Will get help right away if you are not doing well or get worse. ? ? ?Thank you for choosing an e-visit. ? ?Your e-visit answers were reviewed by a board certified advanced clinical practitioner to complete your personal care plan. Depending upon the condition, your plan could have included both over the counter or prescription medications. ? ?Please review your pharmacy choice. Make sure the pharmacy is open so you can pick up prescription now. If there is a problem, you may contact your provider through [employer] and have the prescription routed to another pharmacy.  Your safety is important to [place]. If you have drug allergies check your prescription carefully.  ? ?For the next 24 hours you can use MyChart to ask questions about today's visit, request a non-urgent call back, or ask for a work or school excuse. ?You will get an email in the next two days asking about your experience. I hope that your e-visit has been valuable and will speed your recovery. ? ?I provided 5 minutes of non face-to-face time during this encounter for chart review, medication and order placement, as well as and documentation.  ? ?

## 2021-08-01 ENCOUNTER — Ambulatory Visit: Payer: Medicaid Other | Admitting: Diagnostic Neuroimaging

## 2021-08-05 IMAGING — DX DG CHEST 1V PORT
1 series · 1 of 1 positions shown · non-contrast
Comparison: Prior radiograph from 08/09/2018.

CLINICAL DATA: Initial evaluation for acute shortness of breath,
known COVID infection.

EXAM:
PORTABLE CHEST 1 VIEW

[chest ap]
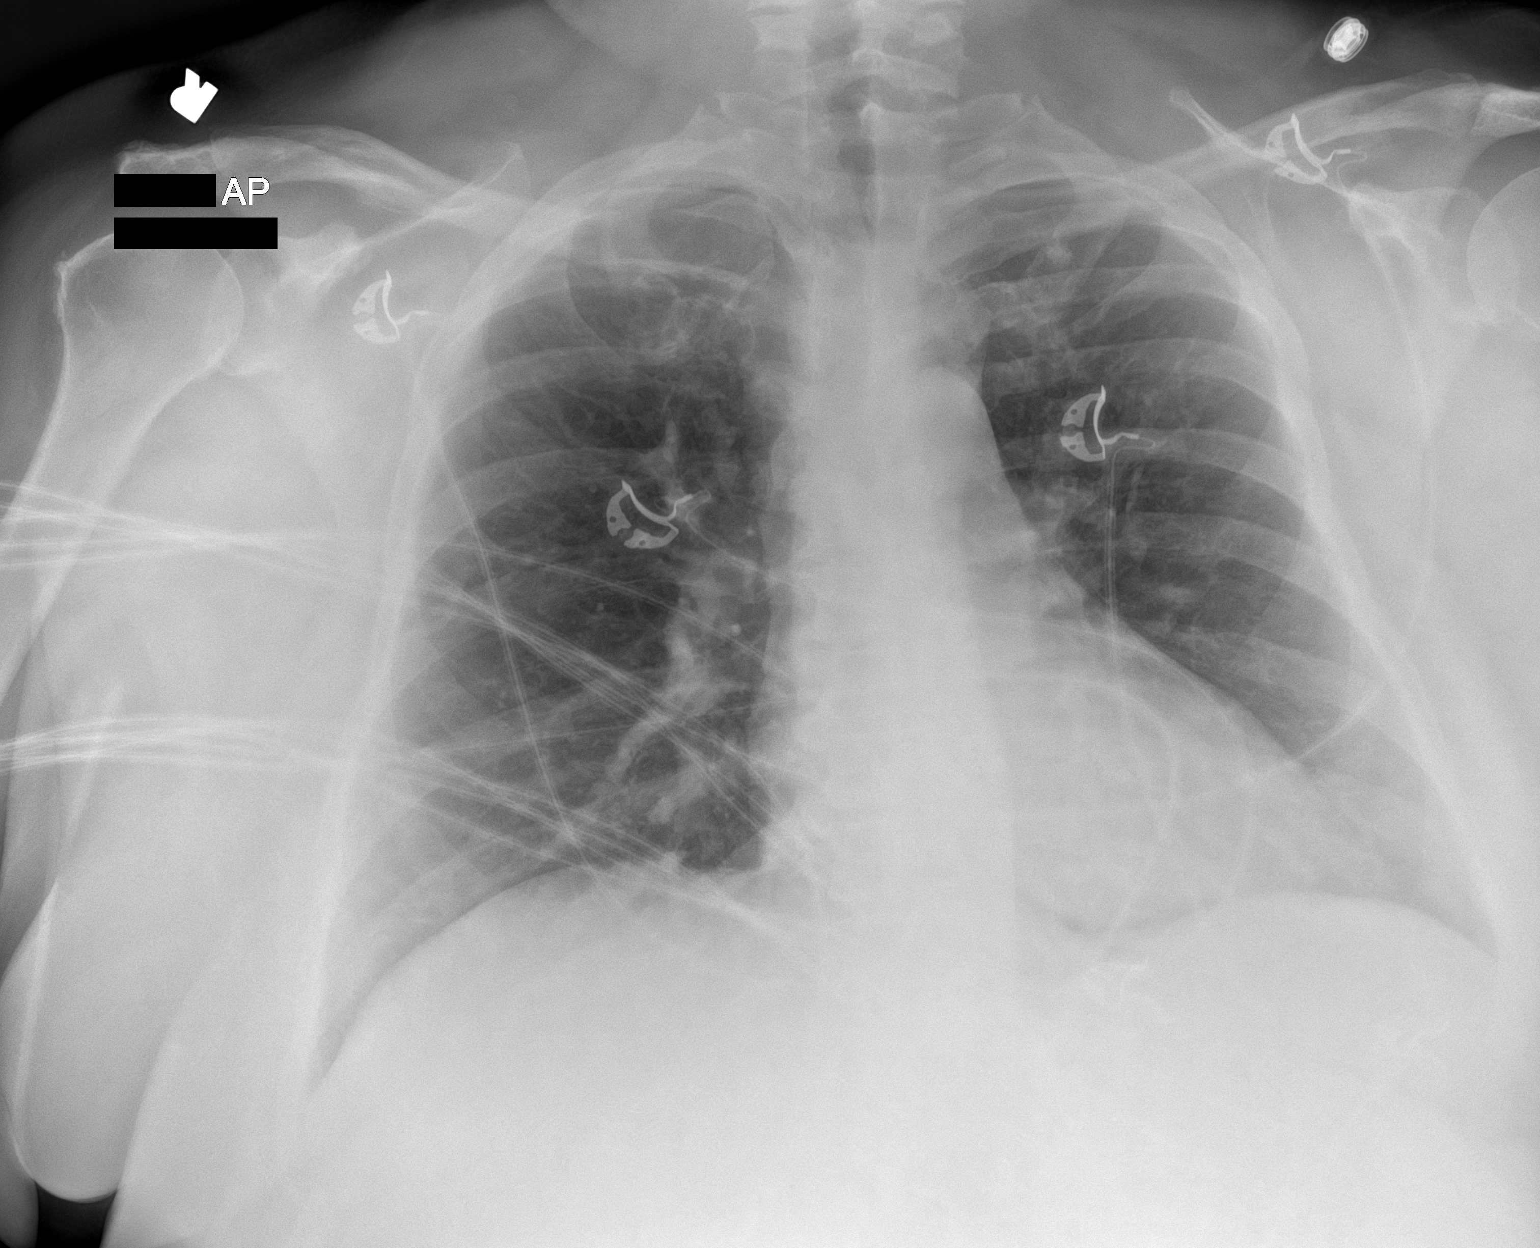

[1 of 1 positions shown; findings below may reference images not displayed]

FINDINGS: The cardiac and mediastinal silhouettes are stable in size and
contour, and remain within normal limits.

The lungs are normally inflated. No airspace consolidation, pleural
effusion, or pulmonary edema. No pneumothorax.

No acute osseous abnormality.
IMPRESSION: No radiographic evidence for active cardiopulmonary disease.

## 2021-08-09 ENCOUNTER — Encounter (HOSPITAL_BASED_OUTPATIENT_CLINIC_OR_DEPARTMENT_OTHER): Payer: Self-pay | Admitting: Obstetrics and Gynecology

## 2021-08-10 ENCOUNTER — Encounter (HOSPITAL_BASED_OUTPATIENT_CLINIC_OR_DEPARTMENT_OTHER): Payer: Self-pay | Admitting: Obstetrics and Gynecology

## 2021-08-10 ENCOUNTER — Other Ambulatory Visit: Payer: Self-pay

## 2021-08-10 DIAGNOSIS — Z01818 Encounter for other preprocedural examination: Secondary | ICD-10-CM | POA: Diagnosis present

## 2021-08-10 NOTE — Progress Notes (Addendum)
?PLEASE WEAR A MASK OUT IN PUBLIC AND SOCIAL DISTANCE AND [place] YOUR HANDS FREQUENTLY. PLEASE ASK ALL YOUR CLOSE HOUSEHOLD CONTACT TO WEAR MASK OUT IN PUBLIC AND SOCIAL DISTANCE AND [place] HANDS FREQUENTLY ALSO. ? ? ? ? ? Your procedure is scheduled on Wednesday, 08/17/21. ? Report to [place] AT 5:30 am. ? ? Call this number if you have problems the morning of surgery  :[phone]. ? ? Christina Cameron.  WE ARE LOCATED IN [facility]. ? ?PLEASE BRING YOUR INSURANCE CARD AND PHOTO ID DAY OF SURGERY. ? ?ONLY ONE PERSON ALLOWED IN FACILITY WAITING AREA. ?                                   ? REMEMBER: ? DO NOT EAT FOOD, CANDY GUM OR MINTS  AFTER MIDNIGHT THE NIGHT BEFORE YOUR SURGERY . YOU MAY HAVE CLEAR LIQUIDS FROM MIDNIGHT THE NIGHT BEFORE YOUR SURGERY UNTIL  4:30 am. NO CLEAR LIQUIDS AFTER   4:30 am DAY OF SURGERY. ? ? YOU MAY  BRUSH YOUR TEETH MORNING OF SURGERY AND RINSE YOUR MOUTH OUT, NO CHEWING GUM CANDY OR MINTS. ? ? ? ?CLEAR LIQUID DIET ? ? ?Foods Allowed                                                                     Foods Excluded ? ?Coffee and tea, regular and decaf                             liquids that you cannot  ?Plain Jell-O any favor except red or purple                                           see through such as: ?Fruit ices (not with fruit pulp)                                     milk, soups, orange juice  ?Iced Popsicles                                    All solid food ?Carbonated beverages, regular and diet                                    ?Cranberry, grape and apple juices ?Sports drinks like Gatorade ? ?Sample Menu ?Breakfast                                Lunch  Supper ?Cranberry juice                                           ?Jell-O                                     Grape juice                           Apple juice ?Coffee or tea                        Jell-O                                       Popsicle ?                                               Coffee or tea                        Coffee or tea ? ?_____________________________________________________________________ ?  ? ? TAKE THESE MEDICATIONS MORNING OF SURGERY WITH A SIP OF WATER:  Albuterol inhaler (please bring with you), Abilify, Azelastine-Fluticasone nasal spray if needed, Zyrtec if needed, Buspirone, Cardizem, Metoprolol, Crestor, Zoloft ?Please take only the above medications on the morning of surgery.  ?Do NOT take diabetic medications on the morning of surgery (Metformin, Farxiga, & Ozempic). ? ?ONE VISITOR IS ALLOWED IN WAITING ROOM ONLY DAY OF SURGERY.  YOU MAY HAVE ANOTHER PERSON SWITCH OUT WITH THE  1  VISITOR IN THE WAITING ROOM DAY OF SURGERY AND A MASK MUST BE WORN IN THE WAITING ROOM.  ? ? 2 VISITORS  MAY VISIT IN THE EXTENDED RECOVERY ROOM UNTIL 800 PM ONLY 1 VISITOR AGE 16 AND OVER MAY SPEND THE NIGHT AND MUST BE IN EXTENDED RECOVERY ROOM NO LATER THAN 800 PM .  ? ? UP TO 2 CHILDREN AGE 104 TO 15 MAY ALSO VISIT IN EXTENDED RECOV ?ERY ROOM ONLY UNTIL 800 PM AND MUST LEAVE BY 800 PM.  ? ?ALL PERSONS VISITING IN EXTENDED RECOVERY ROOM MUST WEAR A MASK. ?                                   ?DO NOT WEAR JEWERLY, MAKE UP. ?DO NOT WEAR LOTIONS, POWDERS, PERFUMES OR NAIL POLISH ON YOUR FINGERNAILS. TOENAIL POLISH IS OK TO WEAR. ?DO NOT SHAVE FOR 48 HOURS PRIOR TO DAY OF SURGERY. ?MEN MAY SHAVE FACE AND NECK. ?CONTACTS, GLASSES, OR DENTURES MAY NOT BE WORN TO SURGERY. ?                                   ?[facility] IS NOT RESPONSIBLE  FOR ANY BELONGINGS.                                  ?                                  . ?  [facility] - Preparing for Surgery ?Before surgery, you can play an important role.  Because skin is not sterile, your skin needs to be as free of germs as possible.  You can reduce the number of germs on your skin by washing with CHG (chlorahexidine gluconate) soap before surgery.  CHG is an  antiseptic cleaner which kills germs and bonds with the skin to continue killing germs even after washing. ?Please DO NOT use if you have an allergy to CHG or antibacterial soaps.  If your skin becomes reddened/irritated stop using the CHG and inform your nurse when you arrive at Short Stay. ?Do not shave (including legs and underarms) for at least 48 hours prior to the first CHG shower.  You may shave your face/neck. ?Please follow these instructions carefully: ? 1.  Shower with CHG Soap the night before surgery and the  morning of Surgery. ? 2.  If you choose to wash your hair, wash your hair first as usual with your  normal  shampoo. ? 3.  After you shampoo, rinse your hair and body thoroughly to remove the  shampoo.                            ?4.  Use CHG as you would any other liquid soap.  You can apply chg directly  to the skin and wash  ?                    Gently with a scrungie or clean washcloth. ? 5.  Apply the CHG Soap to your body ONLY FROM THE NECK DOWN.   Do not use on face/ open      ?                     Wound or open sores. Avoid contact with eyes, ears mouth and genitals (private parts).  ?                     Production manager,  Genitals (private parts) with your normal soap. ?            6.  Wash thoroughly, paying special attention to the area where your surgery  will be performed. ? 7.  Thoroughly rinse your body with warm water from the neck down. ? 8.  DO NOT shower/wash with your normal soap after using and rinsing off  the CHG Soap. ?               9.  Pat yourself dry with a clean towel. ?           10.  Wear clean pajamas. ?           11.  Place clean sheets on your bed the night of your first shower and do not  sleep with pets. ?Day of Surgery : ?Do not apply any lotions/deodorants the morning of surgery.  Please wear clean clothes to the hospital/surgery center. ? ?IF YOU HAVE ANY SKIN IRRITATION OR PROBLEMS WITH THE SURGICAL SOAP, PLEASE GET A BAR OF GOLD DIAL SOAP AND SHOWER THE NIGHT  BEFORE YOUR SURGERY AND THE MORNING OF YOUR SURGERY. PLEASE LET THE NURSE KNOW MORNING OF YOUR SURGERY IF YOU HAD ANY PROBLEMS WITH THE SURGICAL SOAP. ? ? ?________________________________________________________________________                  ?                                    ?  QUESTIONS CALL Dorisann Schwanke PRE OP NURSE PHONE [phone].                                    ?

## 2021-08-10 NOTE — Progress Notes (Addendum)
Spoke w/ via phone for pre-op interview---Christina Cameron ?Lab needs dos----urine pregnancy POCT (ask [facility] if needed) Patient had been a year w/o a period, then started spotting again last year. Hx of endometrial ablation.               ?Lab results------08/15/2021 lab appt for CBC, type & screen, BMP, EKG ?07/30/20 EKG in chart & Epic for comparison ?COVID test -----patient states asymptomatic no test needed ?Arrive at -------0530 on Wednesday, 08/17/21 ?NPO after MN NO Solid Food.  Clear liquids from MN until---0430 ?Med rec completed ?Medications to take morning of surgery -----Albuterol inhaler, Abilify, Azelastine-Fluticasone nasal spray prn, Zyrtec prn, buspirone, Cardizem, Metoprolol, Crestor, Zoloft ?Diabetic medication -----Hold Metformin and Farxiga morning of surgery. Don't take Ozempic morning of surgery.  ?Patient instructed no nail polish to be worn day of surgery ?Patient instructed to bring photo id and insurance card day of surgery ?Patient aware to have Driver (ride ) / caregiver    for 24 hours after surgery  - sister or husband ?Patient Special Instructions -----Bring inhalers. Extended recovery instructions given. ?Pre-Op special Istructions -----Patient states that she needs bilateral knee replacements and might require some assistance with transferring. She states that if she needs assistance, one person should suffice. ?Patient verbalized understanding of instructions that were given at this phone interview. ?Patient denies any new shortness of breath, chest pain, fever, cough at this phone interview. Patient does have SOB w/exertion. She states that due to her heart problems and hx of severe Covid infection in 04/2019, she gets SOB easily. ? ?Patient has a hx of chest pain/tightness, palpitations (s/p SVT ablation 2005), HTN, HLP, OSA (unable to wear CPAP due to PTSD per pt), blilateral leg edema, & morbid obesity. ?LOV w/ Cardiology 10/22/20 Christina Cameron, [name], NP. ? ?12/04/2014 Normal  heart catherization in Epic ?08/2019 cardiac monitor demonstrating sinus rhythm and sinus tachycardia with rate PACs and a brief atrial run (see 10/22/20 Cardiology note in Epic) ?03/02/2020 Normal Myocardial Perfusion stress test in Epic. ?10/20/20 Echocardiogram LVEF 60 to 65% ? ?Patient was to f/u in 6 weeks with cardiology after 10/22/20 OV. No follow-up appointment noted in [facility]. Patient had an appointment with cardiology on 07/28/2021 but had to cancel due to a stomach virus. She has an appointment with Dr. Gasper Sells, cardiologist, on 09/19/2021. ? ?Patient follows with [facility] in [place], Dr. Josephine Igo. ?LOV 06/13/2021 copy in chart. ? ?I reviewed this case with Dr. Ray Church on 08/12/21. Per Dr. Doroteo Glassman, it is okay to proceed with surgery at [facility]. ? ? ?

## 2021-08-12 ENCOUNTER — Other Ambulatory Visit: Payer: Self-pay

## 2021-08-12 NOTE — Anesthesia Preprocedure Evaluation (Addendum)
Anesthesia Evaluation  ?Patient identified by MRN, date of birth, ID band ?Patient awake ? ? ? ?Reviewed: ?Allergy & Precautions, NPO status , Patient's Chart, lab work & pertinent test results ? ?History of Anesthesia Complications ?(+) PONV and history of anesthetic complications ? ?Airway ?Mallampati: II ? ?TM Distance: >3 FB ?Neck ROM: Full ? ? ? Dental ? ?(+) Missing, Poor Dentition,  ?  ?Pulmonary ?asthma , sleep apnea ,  ?  ?Pulmonary exam normal ?breath sounds clear to auscultation ? ? ? ? ? ? Cardiovascular ?hypertension, Pt. on medications ?+CHF (diastilic HF, normal EF) and + DOE  ?+ dysrhythmias (s/p ablation) Supra Ventricular Tachycardia  ?Rhythm:Regular Rate:Normal ? ?Normal sinus rhythm ?Left axis deviation ?Left anterior fasicular block ?Anterior infarct , age undetermined ?Abnormal ECG ?When compared with ECG of 01-Jun-2019 20:37, ?PREVIOUS ECG IS PRESENT No significant change since last tracing ?Confirmed by Charolette Forward [phone]) on 08/15/2021 2:02:46 PM ? ?ECHO 10/20/20 ??1. Left ventricular ejection fraction, by estimation, is 60 to 65%. The  ?left ventricle has normal function. The left ventricle has no regional  ?wall motion abnormalities. There is mild left ventricular hypertrophy.  ?Left ventricular diastolic parameters  ?were normal. The average left ventricular global longitudinal strain is  ?18.1 %. The global longitudinal strain is normal.  ??2. Right ventricular systolic function is normal. The right ventricular  ?size is normal.  ??3. The mitral valve is normal in structure. No evidence of mitral valve  ?regurgitation. No evidence of mitral stenosis.  ??4. The aortic valve has an indeterminant number of cusps. There is mild  ?calcification of the aortic valve. There is mild thickening of the aortic  ?valve. Aortic valve regurgitation is not visualized. No aortic stenosis is  ?present.  ??5. The inferior vena cava is normal in size with greater than 50%   ?respiratory variability, suggesting right atrial pressure of 3 mmHg.  ? ?Comparison(s): Echocardiogram done 07/21/19 showed an EF of 60-65%.  ?  ?Neuro/Psych ? Headaches, PSYCHIATRIC DISORDERS Anxiety Depression  Neuromuscular disease (severe peripheral neuropathy)   ? GI/Hepatic ?  ?Endo/Other  ?diabetes, Type 2, Oral Hypoglycemic AgentsMorbid obesityRaynaud's ? Renal/GU ?  ?negative genitourinary ?  ?Musculoskeletal ? ?(+) Arthritis , Osteoarthritis,   ? Abdominal ?(+) + obese,   ?Peds ?negative pediatric ROS ?(+)  Hematology ?negative hematology ROS ?(+)   ?Anesthesia Other Findings ? ? Reproductive/Obstetrics ? ?  ? ? ? ? ? ? ? ? ? ? ? ? ? ?  ?  ? ? ? ? ? ? ?Anesthesia Physical ?Anesthesia Plan ? ?ASA: 3 ? ?Anesthesia Plan: General  ? ?Post-op Pain Management: Tylenol PO (pre-op)*  ? ?Induction: Intravenous ? ?PONV Risk Score and Plan: 4 or greater and Scopolamine patch - Pre-op, Midazolam, Dexamethasone, Ondansetron and Treatment may vary due to age or medical condition ? ?Airway Management Planned: Oral ETT ? ?Additional Equipment: None ? ?Intra-op Plan:  ? ?Post-operative Plan: Extubation in OR ? ?Informed Consent: I have reviewed the patients History and Physical, chart, labs and discussed the procedure including the risks, benefits and alternatives for the proposed anesthesia with the patient or authorized representative who has indicated his/her understanding and acceptance.  ? ? ? ?Dental advisory given ? ?Plan Discussed with: CRNA and Anesthesiologist ? ?Anesthesia Plan Comments: (Reviewed w/ Doroteo Glassman- had issues w/ CP/SOB last year (May 2022) and saw cardiology- negative stress and normal echo, was supposed to follow up 6 weeks later and did not. Normal imaging, had GA in 02/2021 for D+E without  issue. Now has followup with cardiology scheduled but not until after surgery (April 2023). Approved for [facility]. ? ?GETA. Two front teeth loose. Advised of risk of a tooth becoming dislodged and resulting in  possible swallowing of tooth or aspiration of tooth. Patient advised understanding and consents to proceed. Norton Blizzard, MD  ?)  ? ? ? ? ?Anesthesia Quick Evaluation ? ?

## 2021-08-15 ENCOUNTER — Encounter (HOSPITAL_COMMUNITY)
Admission: RE | Admit: 2021-08-15 | Discharge: 2021-08-15 | Disposition: A | Discharge status: Home / Self Care | Payer: Medicare Other | Source: Ambulatory Visit | Attending: Obstetrics and Gynecology | Admitting: Obstetrics and Gynecology

## 2021-08-15 ENCOUNTER — Other Ambulatory Visit: Payer: Self-pay

## 2021-08-15 DIAGNOSIS — Z01818 Encounter for other preprocedural examination: Secondary | ICD-10-CM | POA: Diagnosis not present

## 2021-08-15 LAB — CBC
HCT: 43.8 % (ref 36.0–46.0)
Hemoglobin: 14.8 g/dL (ref 12.0–15.0)
MCH: 30.1 pg (ref 26.0–34.0)
MCHC: 33.8 g/dL (ref 30.0–36.0)
MCV: 89.2 fL (ref 80.0–100.0)
Platelets: 247 10*3/uL (ref 150–400)
RBC: 4.91 MIL/uL (ref 3.87–5.11)
RDW: 13.4 % (ref 11.5–15.5)
WBC: 8.1 10*3/uL (ref 4.0–10.5)
nRBC: 0 % (ref 0.0–0.2)

## 2021-08-15 LAB — BASIC METABOLIC PANEL
Anion gap: 8 (ref 5–15)
BUN: 22 mg/dL — ABNORMAL HIGH (ref 6–20)
CO2: 27 mmol/L (ref 22–32)
Calcium: 9.3 mg/dL (ref 8.9–10.3)
Chloride: 102 mmol/L (ref 98–111)
Creatinine, Ser: 0.65 mg/dL (ref 0.44–1.00)
GFR, Estimated: >60 mL/min (ref >60)
Glucose, Bld: 138 mg/dL — ABNORMAL HIGH (ref 70–99)
Potassium: 4.5 mmol/L (ref 3.5–5.1)
Sodium: 137 mmol/L (ref 135–145)

## 2021-08-16 NOTE — H&P (Signed)
56 y.o. [id number] complains of postmenopausal bleeding, unable to get good sample either with EMB or d&c, hysteroscopy. ? ?Previously: ?DC pt.; Pt. here for consult on hysterectomy for recurrent PMP bleeding and pelvic cramping; s/p D&C hysteroscopy 03/11/2021, unable to obtain sample; last [place] on 12/16/2020/tb// ? ?Insufficient tissue obtained from hysteroscopy/D&C and continued pmp bleeding. Cramping as well. ? ?Prior US in July Pelvic US: Anteverted uterus 6 x 6.4 x 4.76cm . Multiple small fibroids, largest measuring 2.3 cm. Endometrial stripe measured at 3.56m, but partially obscured by shadowing of fibroids. Ovaries not visualized due to excessive bowel gas. ? ?BMI 45. Cramping all the time like heavy periods. Tried tylenol, midol, not tried ibuprofen. ? ?Some SUI but not once a week. Fairly rare. No prolapse sx. No splinting. ? ?Long discussion with pt: ? ?1. D/w pt that I could do hyst but if endo ca, may need further surgery secondary needing lymph node dissection and oopherectomies. Offered Gyn Onc and pt desires for me to do hyst. ?2. D/w pt details of robo TLH/salpingectomies/possible BSO if abnl. ?3. Ibuprofen for pain in meantime. ?4. No sx of SUI. ?Will schedule robo TLH/salpingectomies/ possible BSO for 4th wed of Feb." ? ?Past Medical History:  ?Diagnosis Date  ? Arm vein blood clot, unspecified laterality   ? around 20 years ago as of 08/10/21, Patient doesn't remember what caused it. She was not put on blood thinners.  ? Arthritis   ? right knee  ? Asthma   ? Cancer ([facility]   ? cervical 1989  ? Chest pain   ? a. normal cors by cath in 11/2014 / 03/02/2020 nuclear stress test demonstrated no perfusion defects consistent with prior infart or current ischemia. Normal study.  ? Chronic diastolic (congestive) heart failure (HCC)   ? 09/27/20 Echocardiogram in Epic showed LVEF 60 to 65%.  ? Complication of anesthesia   ? post-operative nausea & vomiting  ? COVID-19   ? 05/29/19 & 05/2020 Covid infections  ?  Depression   ? Diabetes mellitus, type 2 ([place]   ? Dysrhythmia   ? heart palpitations  ? H/O cardiovascular stress test 03/02/2020  ? 03/02/2020 Nuclear stress test in Epic showed no perusion defects consistent with prior infarct or ischemia - normal study.  ? Headache   ? migraines  ? High cholesterol   ? pt takes Crestor  ? Hypertension   ? Last cardiology office visit, 10/22/20 with ALevell July NP ( see note in Epic) as of 08/09/21.  ? Morbid (severe) obesity due to excess calories ([place]   ? Neuromuscular disorder ([place]   ? severe neuropathy in feet  ? Osteoarthritis of knee, unspecified   ? Palpitations   ? PONV (postoperative nausea and vomiting)   ? Post-COVID syndrome 05/2019  ? Pt experienced chest tightness, sob, palpitations & dizziness after Covid infection. [place] 04/[place]  ? PTSD (post-traumatic stress disorder)   ? Raynaud's syndrome   ? Sleep apnea 09/24/2020  ? sleep study indicated moderate sleep apea  ? SVT (supraventricular tachycardia) ([place]   ? a. s/p ablation by Dr. TLovena Lein 2006.  ? Wears glasses   ? prescripton reading glasses  ? ?Past Surgical History:  ?Procedure Laterality Date  ? CARDIAC CATHETERIZATION N/A 12/14/2014  ? Procedure: Right/Left Heart Cath and Coronary Angiography;  Surgeon: MSherren Mocha MD;  [facility];  Service: Cardiovascular;  Laterality: N/A;  ? CARDIAC ELECTROPHYSIOLOGY STUDY AND ABLATION    ?  around 2006, Patient states that heart rate was up to 180 bpm.  ? CESAREAN SECTION    ? x2 1990, 2005  ? ENDOMETRIAL ABLATION    ? HYSTEROSCOPY WITH D & C N/A 03/11/2021  ? Procedure: DILATATION AND CURETTAGE /HYSTEROSCOPY;  Surgeon: Jerelyn Charles, MD;  Location: [facility];  Service: Gynecology;  Laterality: N/A;  ? OPERATIVE ULTRASOUND N/A 03/11/2021  ? Procedure: OPERATIVE ULTRASOUND;  Surgeon: Jerelyn Charles, MD;  Location: [facility];  Service: Gynecology;  Laterality: N/A;  ultrasound guidance needed  ?  ?Social History   ? ?Socioeconomic History  ? Marital status: Married  ?  Spouse name: Not on file  ? Number of children: Not on file  ? Years of education: Not on file  ? Highest education level: Not on file  ?Occupational History  ? Not on file  ?Tobacco Use  ? Smoking status: Never  ? Smokeless tobacco: Never  ?Vaping Use  ? Vaping Use: Never used  ?Substance and Sexual Activity  ? Alcohol use: Not Currently  ?  Comment: seldom  ? Drug use: No  ? Sexual activity: Yes  ?  Birth control/protection: None  ?Other Topics Concern  ? Not on file  ?Social History Narrative  ? Not on file  ? ?Social Determinants of Health  ? ?Financial Resource Strain: Not on file  ?Food Insecurity: Not on file  ?Transportation Needs: Not on file  ?Physical Activity: Not on file  ?Stress: Not on file  ?Social Connections: Not on file  ?Intimate Partner Violence: Not on file  ? ? ?No current facility-administered medications on file prior to encounter.  ? ?Current Outpatient Medications on File Prior to Encounter  ?Medication Sig Dispense Refill  ? furosemide (LASIX) 20 MG tablet Take 20 mg by mouth as needed.    ? Semaglutide, 2 MG/DOSE, (OZEMPIC, 2 MG/DOSE,) 8 MG/3ML SOPN Inject into the skin. Take on Friday.    ? albuterol (VENTOLIN HFA) 108 (90 Base) MCG/ACT inhaler Inhale 1-2 puffs into the lungs every 6 (six) hours as needed for wheezing or shortness of breath. 18 g 0  ? busPIRone (BUSPAR) 10 MG tablet Take 10 mg by mouth 2 (two) times daily.    ? cetirizine (ZYRTEC) 10 MG tablet Take 10 mg by mouth daily as needed for allergies.    ? diltiazem (CARDIZEM) 90 MG tablet Take 1 tablet (90 mg total) by mouth 2 (two) times daily. 60 tablet 6  ? FARXIGA 10 MG TABS tablet Take 10 mg by mouth daily.    ? indapamide (LOZOL) 2.5 MG tablet Take 2.5 mg by mouth daily as needed (fluid).    ? irbesartan (AVAPRO) 300 MG tablet Take 300 mg by mouth daily.    ? metFORMIN (GLUCOPHAGE) 500 MG tablet Take 1,000 mg by mouth 2 (two) times daily with a meal.    ?  metoprolol tartrate (LOPRESSOR) 50 MG tablet Take 100 mg by mouth 2 (two) times daily.    ? rosuvastatin (CRESTOR) 40 MG tablet Take 20 mg by mouth 2 (two) times daily.    ? sertraline (ZOLOFT) 100 MG tablet Take 150 mg by mouth daily.    ? traZODone (DESYREL) 50 MG tablet Take 50 mg by mouth at bedtime as needed for sleep.    ? ? ?No Known Allergies ? ?Vitals:  ? 08/09/21 1416 08/10/21 1456  ?Weight: 123.4 kg 118.4 kg  ? ? ?Lungs: clear to ascultation ?Cor:  RRR ?Abdomen:  soft, nontender, nondistended. ?Ex:  no  cords, erythema ?Pelvic:   ?Vulva: no masses, no atrophy, no lesions ?Vagina: no tenderness, no erythema, no abnormal vaginal discharge, no vesicle(s) or ulcers, no cystocele, no rectocele ?Cervix: grossly normal, no discharge, no cervical motion tenderness ?Uterus: normal size, normal shape, midline, no uterine prolapse, mobile, non-tender ?Bladder/Urethra: normal meatus, no urethral discharge, no urethral mass, bladder non distended, Urethra well supported ?Adnexa/Parametria: no parametrial tenderness, no parametrial mass, no adnexal tenderness, no ovarian mass ? ?A:  Pt for robotic TLH/BSO/cysto for PMP bleeding.  Pt does understand that there is a potential for endometrial ca and if so, I am unable to do lymph node dissection to complete the standard surgery for that.  Pt still desires me to proceed and declined gyn onc referral.   ? ?P: All risks, benefits and alternatives d/w patient and she desires to proceed.  Patient has undergone ERAS protocol and will receive preop antibiotics and SCDs during the operation.   Pt to have extended recovery but will go home same day if eating, ambulating, voiding and pain control is good. ? ?Daria Pastures  ?

## 2021-08-17 ENCOUNTER — Encounter (HOSPITAL_BASED_OUTPATIENT_CLINIC_OR_DEPARTMENT_OTHER)
Admission: RE | Disposition: A | Discharge status: Home / Self Care | Payer: Self-pay | Source: Home / Self Care | Attending: Obstetrics and Gynecology

## 2021-08-17 ENCOUNTER — Ambulatory Visit (HOSPITAL_BASED_OUTPATIENT_CLINIC_OR_DEPARTMENT_OTHER)
Admission: RE | Admit: 2021-08-17 | Discharge: 2021-08-17 | Disposition: A | Discharge status: Home / Self Care | Payer: Medicare Other | Attending: Obstetrics and Gynecology | Admitting: Obstetrics and Gynecology

## 2021-08-17 ENCOUNTER — Ambulatory Visit (HOSPITAL_BASED_OUTPATIENT_CLINIC_OR_DEPARTMENT_OTHER): Payer: Medicare Other | Admitting: Anesthesiology

## 2021-08-17 ENCOUNTER — Other Ambulatory Visit: Payer: Self-pay

## 2021-08-17 ENCOUNTER — Encounter (HOSPITAL_BASED_OUTPATIENT_CLINIC_OR_DEPARTMENT_OTHER): Payer: Self-pay | Admitting: Obstetrics and Gynecology

## 2021-08-17 DIAGNOSIS — Z6841 Body Mass Index (BMI) 40.0 and over, adult: Secondary | ICD-10-CM | POA: Diagnosis not present

## 2021-08-17 DIAGNOSIS — G4730 Sleep apnea, unspecified: Secondary | ICD-10-CM | POA: Insufficient documentation

## 2021-08-17 DIAGNOSIS — I5032 Chronic diastolic (congestive) heart failure: Secondary | ICD-10-CM | POA: Insufficient documentation

## 2021-08-17 DIAGNOSIS — N950 Postmenopausal bleeding: Secondary | ICD-10-CM | POA: Insufficient documentation

## 2021-08-17 DIAGNOSIS — N838 Other noninflammatory disorders of ovary, fallopian tube and broad ligament: Secondary | ICD-10-CM | POA: Insufficient documentation

## 2021-08-17 DIAGNOSIS — I471 Supraventricular tachycardia: Secondary | ICD-10-CM | POA: Diagnosis not present

## 2021-08-17 DIAGNOSIS — R0600 Dyspnea, unspecified: Secondary | ICD-10-CM | POA: Insufficient documentation

## 2021-08-17 DIAGNOSIS — Z01818 Encounter for other preprocedural examination: Secondary | ICD-10-CM

## 2021-08-17 DIAGNOSIS — N8003 Adenomyosis of the uterus: Secondary | ICD-10-CM | POA: Insufficient documentation

## 2021-08-17 DIAGNOSIS — E669 Obesity, unspecified: Secondary | ICD-10-CM | POA: Diagnosis not present

## 2021-08-17 DIAGNOSIS — E119 Type 2 diabetes mellitus without complications: Secondary | ICD-10-CM | POA: Diagnosis not present

## 2021-08-17 DIAGNOSIS — F32A Depression, unspecified: Secondary | ICD-10-CM | POA: Diagnosis not present

## 2021-08-17 DIAGNOSIS — M1990 Unspecified osteoarthritis, unspecified site: Secondary | ICD-10-CM | POA: Insufficient documentation

## 2021-08-17 DIAGNOSIS — I110 Hypertensive heart disease with heart failure: Secondary | ICD-10-CM | POA: Diagnosis not present

## 2021-08-17 DIAGNOSIS — N72 Inflammatory disease of cervix uteri: Secondary | ICD-10-CM | POA: Diagnosis not present

## 2021-08-17 DIAGNOSIS — F419 Anxiety disorder, unspecified: Secondary | ICD-10-CM | POA: Diagnosis not present

## 2021-08-17 DIAGNOSIS — E1142 Type 2 diabetes mellitus with diabetic polyneuropathy: Secondary | ICD-10-CM | POA: Insufficient documentation

## 2021-08-17 DIAGNOSIS — Z9889 Other specified postprocedural states: Secondary | ICD-10-CM | POA: Diagnosis not present

## 2021-08-17 DIAGNOSIS — D251 Intramural leiomyoma of uterus: Secondary | ICD-10-CM | POA: Insufficient documentation

## 2021-08-17 DIAGNOSIS — Z7984 Long term (current) use of oral hypoglycemic drugs: Secondary | ICD-10-CM | POA: Diagnosis not present

## 2021-08-17 DIAGNOSIS — F418 Other specified anxiety disorders: Secondary | ICD-10-CM | POA: Diagnosis not present

## 2021-08-17 HISTORY — DX: Acute embolism and thrombosis of unspecified veins of unspecified upper extremity: I82.609

## 2021-08-17 HISTORY — DX: Presence of spectacles and contact lenses: Z97.3

## 2021-08-17 HISTORY — PX: CYSTOSCOPY: SHX5120

## 2021-08-17 HISTORY — DX: Depression, unspecified: F32.A

## 2021-08-17 HISTORY — PX: ROBOTIC ASSISTED LAPAROSCOPIC HYSTERECTOMY AND SALPINGECTOMY: SHX6379

## 2021-08-17 LAB — TYPE AND SCREEN
ABO/RH(D): A POS
Antibody Screen: NEGATIVE

## 2021-08-17 LAB — ABO/RH: ABO/RH(D): A POS

## 2021-08-17 LAB — GLUCOSE, CAPILLARY
Glucose-Capillary: 145 mg/dL — ABNORMAL HIGH (ref 70–99)
Glucose-Capillary: 206 mg/dL — ABNORMAL HIGH (ref 70–99)

## 2021-08-17 SURGERY — XI ROBOTIC ASSISTED LAPAROSCOPIC HYSTERECTOMY AND SALPINGECTOMY
Anesthesia: General | Laterality: Bilateral

## 2021-08-17 MED ORDER — DEXAMETHASONE SODIUM PHOSPHATE 4 MG/ML IJ SOLN
INTRAMUSCULAR | Status: DC | PRN
  Administered 2021-08-17: 4 mg via INTRAVENOUS

## 2021-08-17 MED ORDER — LACTATED RINGERS IV SOLN
INTRAVENOUS | Status: DC
Start: 2021-08-17 — End: 2021-08-17

## 2021-08-17 MED ORDER — HYDROMORPHONE HCL 1 MG/ML IJ SOLN
INTRAMUSCULAR | Status: AC
  Filled 2021-08-17: qty 1

## 2021-08-17 MED ORDER — AZELASTINE-FLUTICASONE 137-50 MCG/ACT NA SUSP
1.0000 | NASAL | Status: DC | PRN
Start: 2021-08-17 — End: 2021-08-17

## 2021-08-17 MED ORDER — PHENYLEPHRINE HCL (PRESSORS) 10 MG/ML IV SOLN
INTRAVENOUS | Status: DC | PRN
  Administered 2021-08-17 (×2): 80 ug via INTRAVENOUS

## 2021-08-17 MED ORDER — SENNA 8.6 MG PO TABS
ORAL_TABLET | ORAL | Status: AC
  Filled 2021-08-17: qty 1

## 2021-08-17 MED ORDER — AMISULPRIDE (ANTIEMETIC) 5 MG/2ML IV SOLN: 10.0000 mg | Freq: Once | INTRAVENOUS | Status: DC | PRN

## 2021-08-17 MED ORDER — LORATADINE 10 MG PO TABS
10.0000 mg | ORAL_TABLET | Freq: Every day | ORAL | Status: DC
Start: 2021-08-17 — End: 2021-08-17

## 2021-08-17 MED ORDER — TRAZODONE HCL 50 MG PO TABS: 50.0000 mg | ORAL_TABLET | Freq: Every evening | ORAL | Status: DC | PRN

## 2021-08-17 MED ORDER — SUGAMMADEX SODIUM 200 MG/2ML IV SOLN
INTRAVENOUS | Status: DC | PRN
  Administered 2021-08-17: 240 mg via INTRAVENOUS

## 2021-08-17 MED ORDER — METOPROLOL TARTRATE 25 MG PO TABS: 100.0000 mg | ORAL_TABLET | Freq: Two times a day (BID) | ORAL | Status: DC

## 2021-08-17 MED ORDER — INDAPAMIDE 2.5 MG PO TABS: 2.5000 mg | ORAL_TABLET | Freq: Every day | ORAL | Status: DC | PRN

## 2021-08-17 MED ORDER — LACTATED RINGERS IV SOLN: INTRAVENOUS | Status: DC

## 2021-08-17 MED ORDER — FENTANYL CITRATE (PF) 250 MCG/5ML IJ SOLN
INTRAMUSCULAR | Status: AC
  Filled 2021-08-17: qty 5

## 2021-08-17 MED ORDER — MIDAZOLAM HCL 2 MG/2ML IJ SOLN
INTRAMUSCULAR | Status: AC
  Filled 2021-08-17: qty 2

## 2021-08-17 MED ORDER — ROCURONIUM BROMIDE 10 MG/ML (PF) SYRINGE
PREFILLED_SYRINGE | INTRAVENOUS | Status: AC
Start: 2021-08-17 — End: ?
  Filled 2021-08-17: qty 20

## 2021-08-17 MED ORDER — EPHEDRINE SULFATE (PRESSORS) 50 MG/ML IJ SOLN
INTRAMUSCULAR | Status: DC | PRN
  Administered 2021-08-17: 5 mg via INTRAVENOUS

## 2021-08-17 MED ORDER — ROCURONIUM BROMIDE 100 MG/10ML IV SOLN
INTRAVENOUS | Status: DC | PRN
  Administered 2021-08-17: 20 mg via INTRAVENOUS
  Administered 2021-08-17: 10 mg via INTRAVENOUS
  Administered 2021-08-17: 20 mg via INTRAVENOUS
  Administered 2021-08-17: 80 mg via INTRAVENOUS

## 2021-08-17 MED ORDER — OXYCODONE HCL 5 MG PO TABS
5.0000 mg | ORAL_TABLET | ORAL | Status: DC | PRN
  Administered 2021-08-17 (×2): 10 mg via ORAL

## 2021-08-17 MED ORDER — ONDANSETRON HCL 4 MG/2ML IJ SOLN: 4.0000 mg | Freq: Four times a day (QID) | INTRAMUSCULAR | Status: DC | PRN

## 2021-08-17 MED ORDER — ONDANSETRON HCL 4 MG PO TABS: 4.0000 mg | ORAL_TABLET | Freq: Four times a day (QID) | ORAL | Status: DC | PRN

## 2021-08-17 MED ORDER — SODIUM CHLORIDE 0.9 % IR SOLN
Status: DC | PRN
  Administered 2021-08-17: 1000 mL via INTRAVESICAL

## 2021-08-17 MED ORDER — LIDOCAINE HCL (CARDIAC) PF 100 MG/5ML IV SOSY
PREFILLED_SYRINGE | INTRAVENOUS | Status: DC | PRN
Start: 2021-08-17 — End: 2021-08-17
  Administered 2021-08-17: 60 mg via INTRAVENOUS

## 2021-08-17 MED ORDER — GLYCOPYRROLATE 0.2 MG/ML IJ SOLN
INTRAMUSCULAR | Status: DC | PRN
  Administered 2021-08-17: .1 mg via INTRAVENOUS

## 2021-08-17 MED ORDER — CELECOXIB 200 MG PO CAPS
ORAL_CAPSULE | ORAL | Status: AC
  Filled 2021-08-17: qty 2

## 2021-08-17 MED ORDER — SOD CITRATE-CITRIC ACID 500-334 MG/5ML PO SOLN: 30.0000 mL | ORAL | Status: DC

## 2021-08-17 MED ORDER — FENTANYL CITRATE (PF) 100 MCG/2ML IJ SOLN: 25.0000 ug | INTRAMUSCULAR | Status: DC | PRN

## 2021-08-17 MED ORDER — PROPOFOL 10 MG/ML IV BOLUS
INTRAVENOUS | Status: DC | PRN
  Administered 2021-08-17: 150 mg via INTRAVENOUS

## 2021-08-17 MED ORDER — SCOPOLAMINE 1 MG/3DAYS TD PT72
1.0000 | MEDICATED_PATCH | TRANSDERMAL | Status: DC
  Administered 2021-08-17: 1.5 mg via TRANSDERMAL

## 2021-08-17 MED ORDER — ACETAMINOPHEN 500 MG PO TABS: 1000.0000 mg | ORAL_TABLET | ORAL | Status: DC

## 2021-08-17 MED ORDER — OXYCODONE HCL 5 MG/5ML PO SOLN: 5.0000 mg | Freq: Once | ORAL | Status: DC | PRN

## 2021-08-17 MED ORDER — GABAPENTIN 300 MG PO CAPS
ORAL_CAPSULE | ORAL | Status: AC
  Filled 2021-08-17: qty 1

## 2021-08-17 MED ORDER — ROSUVASTATIN CALCIUM 20 MG PO TABS: 20.0000 mg | ORAL_TABLET | Freq: Two times a day (BID) | ORAL | Status: DC

## 2021-08-17 MED ORDER — GABAPENTIN 300 MG PO CAPS
300.0000 mg | ORAL_CAPSULE | ORAL | Status: AC
  Administered 2021-08-17: 300 mg via ORAL

## 2021-08-17 MED ORDER — OXYCODONE HCL 5 MG PO TABS
ORAL_TABLET | ORAL | Status: AC
  Filled 2021-08-17: qty 2

## 2021-08-17 MED ORDER — KETOROLAC TROMETHAMINE 15 MG/ML IJ SOLN: 15.0000 mg | INTRAMUSCULAR | Status: DC

## 2021-08-17 MED ORDER — CEFAZOLIN SODIUM-DEXTROSE 2-4 GM/100ML-% IV SOLN
INTRAVENOUS | Status: AC
Start: 2021-08-17 — End: ?
  Filled 2021-08-17: qty 100

## 2021-08-17 MED ORDER — FLUORESCEIN SODIUM 10 % IV SOLN
INTRAVENOUS | Status: DC | PRN
  Administered 2021-08-17: 10 mg via INTRAVENOUS

## 2021-08-17 MED ORDER — KETOROLAC TROMETHAMINE 30 MG/ML IJ SOLN
INTRAMUSCULAR | Status: AC
  Filled 2021-08-17: qty 1

## 2021-08-17 MED ORDER — SERTRALINE HCL 50 MG PO TABS: 150.0000 mg | ORAL_TABLET | Freq: Every day | ORAL | Status: DC

## 2021-08-17 MED ORDER — IRBESARTAN 300 MG PO TABS: 300.0000 mg | ORAL_TABLET | Freq: Every day | ORAL | Status: DC

## 2021-08-17 MED ORDER — ALBUTEROL SULFATE HFA 108 (90 BASE) MCG/ACT IN AERS: 1.0000 | INHALATION_SPRAY | Freq: Four times a day (QID) | RESPIRATORY_TRACT | Status: DC | PRN

## 2021-08-17 MED ORDER — ACETAMINOPHEN 500 MG PO TABS
ORAL_TABLET | ORAL | Status: AC
  Filled 2021-08-17: qty 2

## 2021-08-17 MED ORDER — FENTANYL CITRATE (PF) 100 MCG/2ML IJ SOLN
INTRAMUSCULAR | Status: DC | PRN
  Administered 2021-08-17 (×5): 50 ug via INTRAVENOUS

## 2021-08-17 MED ORDER — FENTANYL CITRATE (PF) 100 MCG/2ML IJ SOLN
INTRAMUSCULAR | Status: AC
  Filled 2021-08-17: qty 2

## 2021-08-17 MED ORDER — DILTIAZEM HCL 90 MG PO TABS: 90.0000 mg | ORAL_TABLET | Freq: Two times a day (BID) | ORAL | Status: DC

## 2021-08-17 MED ORDER — ONDANSETRON HCL 4 MG/2ML IJ SOLN: 4.0000 mg | Freq: Once | INTRAMUSCULAR | Status: DC | PRN

## 2021-08-17 MED ORDER — DAPAGLIFLOZIN PROPANEDIOL 10 MG PO TABS: 10.0000 mg | ORAL_TABLET | Freq: Every day | ORAL | Status: DC

## 2021-08-17 MED ORDER — CEFAZOLIN IN SODIUM CHLORIDE 3-0.9 GM/100ML-% IV SOLN
3.0000 g | INTRAVENOUS | Status: AC
  Administered 2021-08-17: 2 g via INTRAVENOUS

## 2021-08-17 MED ORDER — OXYCODONE HCL 5 MG PO TABS: 5.0000 mg | ORAL_TABLET | Freq: Once | ORAL | Status: DC | PRN

## 2021-08-17 MED ORDER — SCOPOLAMINE 1 MG/3DAYS TD PT72
MEDICATED_PATCH | TRANSDERMAL | Status: AC
  Filled 2021-08-17: qty 1

## 2021-08-17 MED ORDER — IBUPROFEN 800 MG PO TABS: 800.0000 mg | ORAL_TABLET | Freq: Three times a day (TID) | ORAL | Status: DC

## 2021-08-17 MED ORDER — SENNA 8.6 MG PO TABS
1.0000 | ORAL_TABLET | Freq: Two times a day (BID) | ORAL | Status: DC
  Administered 2021-08-17: 8.6 mg via ORAL

## 2021-08-17 MED ORDER — KETOROLAC TROMETHAMINE 30 MG/ML IJ SOLN
30.0000 mg | Freq: Once | INTRAMUSCULAR | Status: AC
  Administered 2021-08-17: 30 mg via INTRAVENOUS

## 2021-08-17 MED ORDER — SODIUM CHLORIDE 0.9 % IV SOLN
INTRAVENOUS | Status: DC | PRN
  Administered 2021-08-17: 60 mL

## 2021-08-17 MED ORDER — BUSPIRONE HCL 10 MG PO TABS: 10.0000 mg | ORAL_TABLET | Freq: Two times a day (BID) | ORAL | Status: DC

## 2021-08-17 MED ORDER — OXYCODONE HCL 5 MG PO CAPS: 5.0000 mg | ORAL_CAPSULE | ORAL | 0 refills | Status: DC | PRN

## 2021-08-17 MED ORDER — MENTHOL 3 MG MT LOZG: 1.0000 | LOZENGE | OROMUCOSAL | Status: DC | PRN

## 2021-08-17 MED ORDER — ACETAMINOPHEN 500 MG PO TABS
1000.0000 mg | ORAL_TABLET | Freq: Once | ORAL | Status: AC
  Administered 2021-08-17: 1000 mg via ORAL

## 2021-08-17 MED ORDER — HYDROMORPHONE HCL 1 MG/ML IJ SOLN
0.2000 mg | Freq: Once | INTRAMUSCULAR | Status: AC
  Administered 2021-08-17: 0.2 mg via INTRAVENOUS

## 2021-08-17 MED ORDER — ONDANSETRON HCL 4 MG/2ML IJ SOLN
INTRAMUSCULAR | Status: DC | PRN
  Administered 2021-08-17: 4 mg via INTRAVENOUS

## 2021-08-17 MED ORDER — MIDAZOLAM HCL 2 MG/2ML IJ SOLN
INTRAMUSCULAR | Status: DC | PRN
  Administered 2021-08-17: 2 mg via INTRAVENOUS

## 2021-08-17 MED ORDER — OXYCODONE HCL 5 MG PO TABS
ORAL_TABLET | ORAL | Status: AC
Start: 2021-08-17 — End: ?
  Filled 2021-08-17: qty 2

## 2021-08-17 MED ORDER — CELECOXIB 200 MG PO CAPS
400.0000 mg | ORAL_CAPSULE | ORAL | Status: AC
  Administered 2021-08-17: 400 mg via ORAL

## 2021-08-17 MED ORDER — METFORMIN HCL 500 MG PO TABS: 1000.0000 mg | ORAL_TABLET | Freq: Two times a day (BID) | ORAL | Status: DC

## 2021-08-17 MED ORDER — POVIDONE-IODINE 10 % EX SWAB: 2.0000 "application " | Freq: Once | CUTANEOUS | Status: DC

## 2021-08-17 MED ORDER — FUROSEMIDE 20 MG PO TABS: 20.0000 mg | ORAL_TABLET | ORAL | Status: DC | PRN

## 2021-08-17 SURGICAL SUPPLY — 60 items
ADH SKN CLS APL DERMABOND .7 (GAUZE/BANDAGES/DRESSINGS) ×2
BARRIER ADHS 3X4 INTERCEED (GAUZE/BANDAGES/DRESSINGS) IMPLANT
BRR ADH 4X3 ABS CNTRL BYND (GAUZE/BANDAGES/DRESSINGS)
COVER BACK TABLE 60X90IN (DRAPES) ×4 IMPLANT
COVER TIP SHEARS 8 DVNC (MISCELLANEOUS) ×3 IMPLANT
COVER TIP SHEARS 8MM DA VINCI (MISCELLANEOUS) ×3
DECANTER SPIKE VIAL GLASS SM (MISCELLANEOUS) ×4 IMPLANT
DEFOGGER SCOPE WARMER CLEARIFY (MISCELLANEOUS) ×4 IMPLANT
DERMABOND ADVANCED (GAUZE/BANDAGES/DRESSINGS) ×1
DERMABOND ADVANCED .7 DNX12 (GAUZE/BANDAGES/DRESSINGS) ×3 IMPLANT
DRAPE ARM DVNC X/XI (DISPOSABLE) ×12 IMPLANT
DRAPE COLUMN DVNC XI (DISPOSABLE) ×3 IMPLANT
DRAPE DA VINCI XI ARM (DISPOSABLE) ×12
DRAPE DA VINCI XI COLUMN (DISPOSABLE) ×3
DRAPE ROBOTICS STRL (DRAPES) ×4 IMPLANT
DRAPE UTILITY XL STRL (DRAPES) ×4 IMPLANT
DURAPREP 26ML APPLICATOR (WOUND CARE) ×4 IMPLANT
ELECT REM PT RETURN 9FT ADLT (ELECTROSURGICAL) ×3
ELECTRODE REM PT RTRN 9FT ADLT (ELECTROSURGICAL) ×3 IMPLANT
GAUZE 4X4 16PLY ~~LOC~~+RFID DBL (SPONGE) ×8 IMPLANT
GLOVE SRG 8 PF TXTR STRL LF DI (GLOVE) ×3 IMPLANT
GLOVE SURG ENC MOIS LTX SZ6 (GLOVE) IMPLANT
GLOVE SURG ENC MOIS LTX SZ7 (GLOVE) ×16 IMPLANT
GLOVE SURG UNDER POLY LF SZ6 (GLOVE) IMPLANT
GLOVE SURG UNDER POLY LF SZ8 (GLOVE) ×3
HOLDER FOLEY CATH W/STRAP (MISCELLANEOUS) IMPLANT
IRRIG SUCT STRYKERFLOW 2 WTIP (MISCELLANEOUS) ×3
IRRIGATION SUCT STRKRFLW 2 WTP (MISCELLANEOUS) ×3 IMPLANT
KIT TURNOVER CYSTO (KITS) ×4 IMPLANT
LEGGING LITHOTOMY PAIR STRL (DRAPES) ×4 IMPLANT
MANIPULATOR ADVINCU DEL 2.5 PL (MISCELLANEOUS) IMPLANT
MANIPULATOR ADVINCU DEL 3.0 PL (MISCELLANEOUS) ×1 IMPLANT
MANIPULATOR ADVINCU DEL 3.5 PL (MISCELLANEOUS) IMPLANT
MANIPULATOR ADVINCU DEL 4.0 PL (MISCELLANEOUS) IMPLANT
NEEDLE INSUFFLATION 120MM (ENDOMECHANICALS) ×4 IMPLANT
OBTURATOR OPTICAL STANDARD 8MM (TROCAR) ×3
OBTURATOR OPTICAL STND 8 DVNC (TROCAR) ×2
OBTURATOR OPTICALSTD 8 DVNC (TROCAR) ×3 IMPLANT
PACK ROBOT WH (CUSTOM PROCEDURE TRAY) ×4 IMPLANT
PACK ROBOTIC GOWN (GOWN DISPOSABLE) ×4 IMPLANT
PACK TRENDGUARD 450 HYBRID PRO (MISCELLANEOUS) IMPLANT
PAD OB MATERNITY 4.3X12.25 (PERSONAL CARE ITEMS) ×4 IMPLANT
PAD PREP 24X48 CUFFED NSTRL (MISCELLANEOUS) ×4 IMPLANT
POUCH LAPAROSCOPIC INSTRUMENT (MISCELLANEOUS) IMPLANT
PROTECTOR NERVE ULNAR (MISCELLANEOUS) ×8 IMPLANT
SEAL CANN UNIV 5-8 DVNC XI (MISCELLANEOUS) ×9 IMPLANT
SEAL XI 5MM-8MM UNIVERSAL (MISCELLANEOUS) ×9
SET IRRIG Y TYPE TUR BLADDER L (SET/KITS/TRAYS/PACK) ×4 IMPLANT
SET TRI-LUMEN FLTR TB AIRSEAL (TUBING) ×4 IMPLANT
SPONGE T-LAP 4X18 ~~LOC~~+RFID (SPONGE) ×3 IMPLANT
SUT VIC AB 0 CT1 36 (SUTURE) ×4 IMPLANT
SUT VICRYL RAPIDE 3 0 (SUTURE) ×8 IMPLANT
SUT VLOC 180 0 9IN  GS21 (SUTURE) ×3
SUT VLOC 180 0 9IN GS21 (SUTURE) ×3 IMPLANT
TOWEL OR 17X26 10 PK STRL BLUE (TOWEL DISPOSABLE) ×4 IMPLANT
TRAY FOLEY W/BAG SLVR 14FR LF (SET/KITS/TRAYS/PACK) ×4 IMPLANT
TRENDGUARD 450 HYBRID PRO PACK (MISCELLANEOUS) ×3
TROCAR BLADELESS OPT 5 100 (ENDOMECHANICALS) IMPLANT
TROCAR PORT AIRSEAL 5X120 (TROCAR) ×4 IMPLANT
WATER STERILE IRR 1000ML POUR (IV SOLUTION) ×4 IMPLANT

## 2021-08-17 NOTE — Anesthesia Postprocedure Evaluation (Signed)
Anesthesia Post Note ? ?Patient: Christina Cameron ? ?Procedure(s) Performed: XI ROBOTIC ASSISTED LAPAROSCOPIC HYSTERECTOMY AND  BILATERAL SALPINGECTOMY AND OOPHERETOMY (Bilateral) ?CYSTOSCOPY ? ?  ? ?Patient location during evaluation: PACU ?Anesthesia Type: General ?Level of consciousness: awake ?Pain management: pain level controlled ?Vital Signs Assessment: post-procedure vital signs reviewed and stable ?Respiratory status: spontaneous breathing and respiratory function stable ?Cardiovascular status: stable ?Postop Assessment: no apparent nausea or vomiting ?Anesthetic complications: no ? ? ?No notable events documented. ? ?Last Vitals:  ?Vitals:  ? 08/17/21 1115 08/17/21 1249  ?BP: 125/79 124/89  ?Pulse: 70 72  ?Resp: 16 16  ?Temp: 36.9 ?C   ?SpO2: 94% 97%  ?  ?Last Pain:  ?Vitals:  ? 08/17/21 1115  ?TempSrc:   ?PainSc: 8   ? ? ?  ?  ?  ?  ?  ?  ? ?Merlinda Frederick ? ? ? ? ?

## 2021-08-17 NOTE — Anesthesia Procedure Notes (Addendum)
Procedure Name: Intubation ?Date/Time: 08/17/2021 7:34 AM ?Performed by: Georgeanne Nim, CRNA ?Pre-anesthesia Checklist: Patient identified, Emergency Drugs available, Suction available, Patient being monitored and Timeout performed ?Patient Re-evaluated:Patient Re-evaluated prior to induction ?Oxygen Delivery Method: Circle system utilized ?Preoxygenation: Pre-oxygenation with 100% oxygen ?Induction Type: IV induction ?Ventilation: Mask ventilation without difficulty ?Laryngoscope Size: Mac and 4 ?Grade View: Grade II ?Tube type: Oral ?Tube size: 7.0 mm ?Number of attempts: 1 ?Airway Equipment and Method: Stylet ?Placement Confirmation: ETT inserted through vocal cords under direct vision, positive ETCO2, CO2 detector and breath sounds checked- equal and bilateral ?Secured at: 22 cm ?Tube secured with: Tape ?Dental Injury: Teeth and Oropharynx as per pre-operative assessment  ?Comments: Front teeth intact and undamaged ? ? ? ? ?

## 2021-08-17 NOTE — Transfer of Care (Signed)
Immediate Anesthesia Transfer of Care Note ? ?Patient: Christina Cameron ? ?Procedure(s) Performed: XI ROBOTIC ASSISTED LAPAROSCOPIC HYSTERECTOMY AND  BILATERAL SALPINGECTOMY AND OOPHERETOMY (Bilateral) ?CYSTOSCOPY ? ?Patient Location: PACU ? ?Anesthesia Type:General ? ?Level of Consciousness: awake, alert , oriented and patient cooperative ? ?Airway & Oxygen Therapy: Patient Spontanous Breathing and Patient connected to nasal cannula oxygen ? ?Post-op Assessment: Report given to RN and Post -op Vital signs reviewed and stable ? ?Post vital signs: Reviewed and stable ? ?Last Vitals:  ?Vitals Value Taken Time  ?BP    ?Temp    ?Pulse    ?Resp    ?SpO2    ? ? ?Last Pain:  ?Vitals:  ? 08/17/21 0620  ?TempSrc: Oral  ?PainSc: 4   ?   ? ?Patients Stated Pain Goal: 5 (08/17/21 9037) ? ?Complications: No notable events documented. ?

## 2021-08-17 NOTE — Discharge Summary (Signed)
Physician Discharge Summary  ?Patient ID: ?Christina Cameron ?MRN: [id number] ?DOB/AGE: 57/18/1967 57 y.o. ? ?Admit date: 08/17/2021 ?Discharge date: 08/17/2021 ? ?Admission Diagnoses: ? ?Discharge Diagnoses:  ?Principal Problem: ?  Abnormal vaginal bleeding in postmenopausal patient ? ? ?Discharged Condition: good ? ?Hospital Course: robotic assisted total laparoscopic hysterectomy with bilateral salpingoopherectomies.  ? ?Consults: None ? ?Significant Diagnostic Studies: labs ? ?Treatments: surgery: robotic assisted total laparoscopic hysterectomy with bilateral salpingoopherectomies. ? ?Discharge Exam: ?Blood pressure 135/85, pulse 76, temperature 98.6 ?F (37 ?C), temperature source Oral, height '5\' 4"'$  (1.626 m), weight 119.7 kg, SpO2 95 %. ? ? ?Disposition: Discharge disposition: 01-Home or Self Care ? ? ? ? ? ? ?Discharge Instructions   ? ? Call MD for:  temperature >100.4   Complete by: As directed ?  ? Diet - low sodium heart healthy   Complete by: As directed ?  ? Discharge instructions   Complete by: As directed ?  ? No driving on narcotics, no sexual activity for 2 weeks.  ? Increase activity slowly   Complete by: As directed ?  ? May shower / Bathe   Complete by: As directed ?  ? Shower, no bath for 2 weeks.  ? Remove dressing in 24 hours   Complete by: As directed ?  ? Sexual Activity Restrictions   Complete by: As directed ?  ? No sexual activity for 2 weeks.  ? ?  ? ?Allergies as of 08/17/2021   ?No Active Allergies ?  ? ?  ?Medication List  ?  ? ?TAKE these medications   ? ?albuterol 108 (90 Base) MCG/ACT inhaler ?Commonly known as: VENTOLIN HFA ?Inhale 1-2 puffs into the lungs every 6 (six) hours as needed for wheezing or shortness of breath. ?  ?Azelastine-Fluticasone 137-50 MCG/ACT Susp ?Place 1 spray into the nose every 12 (twelve) hours. ?What changed:  ?when to take this ?reasons to take this ?  ?busPIRone 10 MG tablet ?Commonly known as: BUSPAR ?Take 10 mg by mouth 2 (two) times daily. ?  ?cetirizine 10  MG tablet ?Commonly known as: ZYRTEC ?Take 10 mg by mouth daily as needed for allergies. ?  ?diltiazem 90 MG tablet ?Commonly known as: CARDIZEM ?Take 1 tablet (90 mg total) by mouth 2 (two) times daily. ?  ?Farxiga 10 MG Tabs tablet ?Generic drug: dapagliflozin propanediol ?Take 10 mg by mouth daily. ?  ?furosemide 20 MG tablet ?Commonly known as: LASIX ?Take 20 mg by mouth as needed. ?  ?indapamide 2.5 MG tablet ?Commonly known as: LOZOL ?Take 2.5 mg by mouth daily as needed (fluid). ?  ?irbesartan 300 MG tablet ?Commonly known as: AVAPRO ?Take 300 mg by mouth daily. ?  ?metFORMIN 500 MG tablet ?Commonly known as: GLUCOPHAGE ?Take 1,000 mg by mouth 2 (two) times daily with a meal. ?  ?metoprolol tartrate 50 MG tablet ?Commonly known as: LOPRESSOR ?Take 100 mg by mouth 2 (two) times daily. ?  ?oxycodone 5 MG capsule ?Commonly known as: OXY-IR ?Take 1 capsule (5 mg total) by mouth every 4 (four) hours as needed. ?  ?Ozempic (2 MG/DOSE) 8 MG/3ML Sopn ?Generic drug: Semaglutide (2 MG/DOSE) ?Inject into the skin. Take on Friday. ?  ?rosuvastatin 40 MG tablet ?Commonly known as: CRESTOR ?Take 20 mg by mouth 2 (two) times daily. ?  ?sertraline 100 MG tablet ?Commonly known as: ZOLOFT ?Take 150 mg by mouth daily. ?  ?traZODone 50 MG tablet ?Commonly known as: DESYREL ?Take 50 mg by mouth at bedtime as needed for  sleep. ?  ? ?  ? ? Follow-up Information   ? ? Bobbye Charleston, MD. Schedule an appointment as soon as possible for a visit in 2 week(s).   ?Specialty: Obstetrics and Gynecology ?Contact information: ?[address] ?[address] ?[place] [address] ?[phone] ? ? ?  ?  ? ?  ?  ? ?  ? ? ?Signed: ?Daria Pastures ?08/17/2021, 10:06 AM ? ? ?

## 2021-08-17 NOTE — Op Note (Signed)
08/17/2021 ? ?9:44 AM ? ?PATIENT:  Christina Cameron  56 y.o. female ? ?PRE-OPERATIVE DIAGNOSIS:  post menopausal bleeding ? ?POST-OPERATIVE DIAGNOSIS:   AND ? ?PROCEDURE:  Procedure(s): ?XI ROBOTIC ASSISTED LAPAROSCOPIC HYSTERECTOMY AND  BILATERAL SALPINGECTOMY AND OOPHERETOMY (Bilateral) ?CYSTOSCOPY ? ?SURGEON:  Surgeon(s) and Role: ?   Bobbye Charleston, MD - Primary ?   Rowland Lathe, MD - Assisting ? ?ANESTHESIA:   general ? ?EBL:  <100 cc ? ?LOCAL MEDICATIONS USED:  OTHER Ropivicaine, arista, fluorocein ? ?SPECIMEN:  Source of Specimen:  uterus, cervix, tubes, ovaries ? ?DISPOSITION OF SPECIMEN:  PATHOLOGY ? ?COUNTS:  YES ? ?TOURNIQUET:  * No tourniquets in log * ? ?DICTATION: .Note written in EPIC ? ?PLAN OF CARE: Admit for overnight observation ? ?PATIENT DISPOSITION:  PACU - hemodynamically stable. ?  ?Delay start of Pharmacological VTE agent (>24hrs) due to surgical blood loss or risk of bleeding: not applicable ? ?Complications:  None. ? ?Findings: 9 weeks size uterus.  Ovaries were normal bilaterally.  The ureters were identified during multiple points of the case and were always out of the field of dissection.  Dividing the uterus once removed revealed fibroids only, no lesions in the EM.  On cystoscopy, the bladder was intact and bilateral spill was seen from each ureteral oriface.   ? ?Medications:  Ancef. Pyridium preop. Exparel  intra peritoneal. ? ?Technique: ? ?After adequate anesthesia was achieved the patient was positioned, prepped and draped in usual sterile fashion.  A speculum was placed in the vagina and the cervix dilated with pratt dilators.  The 3 cm Advincula was placed in the proper fashion.  The  Speculum was removed and the bladder catheterized with a foley.   ? ?Attention was turned to the abdomen where a 1 cm incision was made above the umbilicus.  The veress needle was inserted without aspiration of bowel contents or blood.  The 8.34m trocar was placed and the other three  trocar sites were marked out, all approximately 10 cm from each other and the camera. Two 8.5 mm trocars were placed on either side of the camera port.  A 5 mm assistant port was placed 3 cm above the plane of the other trocars.  All trocars were inserted under direct visualization of the camera.  The patient was placed in trendelenburg and then the Robot docked.  The bipolar forceps was placed on arm 1 and the Hot shears on arm 3 and introduced under direct visualization of the camera. ? ? I then broke scrub and sat down at the console.  The above findings were noted and the ureters identified well out of the field of dissection.  The right round ligament was identified and cauterized with the bipolar  It  was then divided with the bipolar cautery and shears.  The peritoneum parallel to the IP ligament was carefully incised and the posterior broad ligament was then incised under the right IP ligament with care to be above the ureter.  The R IP ligament was cauterized with the bipolar and then incised with the shears.  The Broad and cardinal ligaments were then cauterized against the cervix to the level of the Koh ring, securing the uterine artery.  Each pedicle was then incised with the shears.  The anterior leaf was then incised at the reflection of the vessico-uterine junction and the lateral bladder retracted inferiorly after the round ligament had been divided with the bipolar forceps.  The round ligament was cauterized with  the bipolar and divided with the shears;  then the left IP ligament divided with the bipolar forceps and the scissors in the same manner as the right. The ureters were located by bluntly dissecting the retroperitoneal space below the IPs. The broad and cardinal ligaments were then cauterized on the left in the same way.   At the level of the internal os, the uterine arteries were bilaterally cauterized with the bipolar.  The ureters were identified well out of the field of dissection.   The  anterior peritoneum was tented up and incised with the monopolar and the bladder retracted inferiorly.  The vesicovaginal fascia was incised with the monopolar shears and then pushed inferiorly off the vagina to about one cm below the Koh ring.  ? ?The uterus was amputated at the level of the reflection of the vagina onto the cervix with monopolar cautery and then removed through the vagina.  The uterus was opened off the surgical field and no lesions were seen in the EM, just fibroids in the myometrium. The pedicles were then checked with the gas pressure down and found to be hemostatic.  The vaginal cuff was closed with a running stitch of 2-0 V-Loc.  The Robot was undocked and Arista placed inside the peritoneal cavity at the cuff and peritoneal dissections.  Ropivicaine was then introduced into the pelvis.  All instruments and trocars were withdrawn from the abdomen and the abdomen desufflated. The skin incisions were closed with 4-0 vicryl R and dermabond.  The cystoscope was placed in the bladder and good spill was seen from the bilateral ureteral orifices.  The bladder was intact.   ? ?The patient tolerated the procedure well and was returned to the recovery room in stable condition. ? ? ?  ?

## 2021-08-17 NOTE — Progress Notes (Signed)
There has been no change in the patients history, status or exam since the history and physical. ? ?Vitals:  ? 08/09/21 1416 08/10/21 1456 08/17/21 0620  ?BP:   135/85  ?Pulse:   76  ?Temp:   98.6 ?F (37 ?C)  ?TempSrc:   Oral  ?SpO2:   95%  ?Weight: 123.4 kg 118.4 kg 119.7 kg  ?Height:   '5\' 4"'$  (1.626 m)  ? ? ?Results for orders placed or performed during the hospital encounter of 08/17/21 (from the past 72 hour(s))  ?Glucose, capillary     Status: Abnormal  ? Collection Time: 08/17/21  6:39 AM  ?Result Value Ref Range  ? Glucose-Capillary 145 (H) 70 - 99 mg/dL  ?  Comment: Glucose reference range applies only to samples taken after fasting for at least 8 hours.  ? ? ?Daria Pastures  ?

## 2021-08-17 NOTE — Brief Op Note (Signed)
08/17/2021 ? ?9:44 AM ? ?PATIENT:  Christina Cameron  56 y.o. female ? ?PRE-OPERATIVE DIAGNOSIS:  post menopausal bleeding ? ?POST-OPERATIVE DIAGNOSIS:   AND ? ?PROCEDURE:  Procedure(s): ?XI ROBOTIC ASSISTED LAPAROSCOPIC HYSTERECTOMY AND  BILATERAL SALPINGECTOMY AND OOPHERETOMY (Bilateral) ?CYSTOSCOPY ? ?SURGEON:  Surgeon(s) and Role: ?   Bobbye Charleston, MD - Primary ?   Rowland Lathe, MD - Assisting ? ?ANESTHESIA:   general ? ?EBL:  <100 cc ? ?LOCAL MEDICATIONS USED:  OTHER Ropivicaine, arista, fluorocein ? ?SPECIMEN:  Source of Specimen:  uterus, cervix, tubes, ovaries ? ?DISPOSITION OF SPECIMEN:  PATHOLOGY ? ?COUNTS:  YES ? ?TOURNIQUET:  * No tourniquets in log * ? ?DICTATION: .Note written in EPIC ? ?PLAN OF CARE: Admit for overnight observation ? ?PATIENT DISPOSITION:  PACU - hemodynamically stable. ?  ?Delay start of Pharmacological VTE agent (>24hrs) due to surgical blood loss or risk of bleeding: not applicable ? ?

## 2021-08-18 ENCOUNTER — Encounter (HOSPITAL_BASED_OUTPATIENT_CLINIC_OR_DEPARTMENT_OTHER): Payer: Self-pay | Admitting: Obstetrics and Gynecology

## 2021-08-22 LAB — SURGICAL PATHOLOGY

## 2021-08-24 IMAGING — DX DG CHEST 2V
2 series · 2 of 2 positions shown · non-contrast
Comparison: 06/01/2019 and 08/09/2018

CLINICAL DATA: Progressive cough. [id number].

EXAM:
CHEST - 2 VIEW

[chest pa]
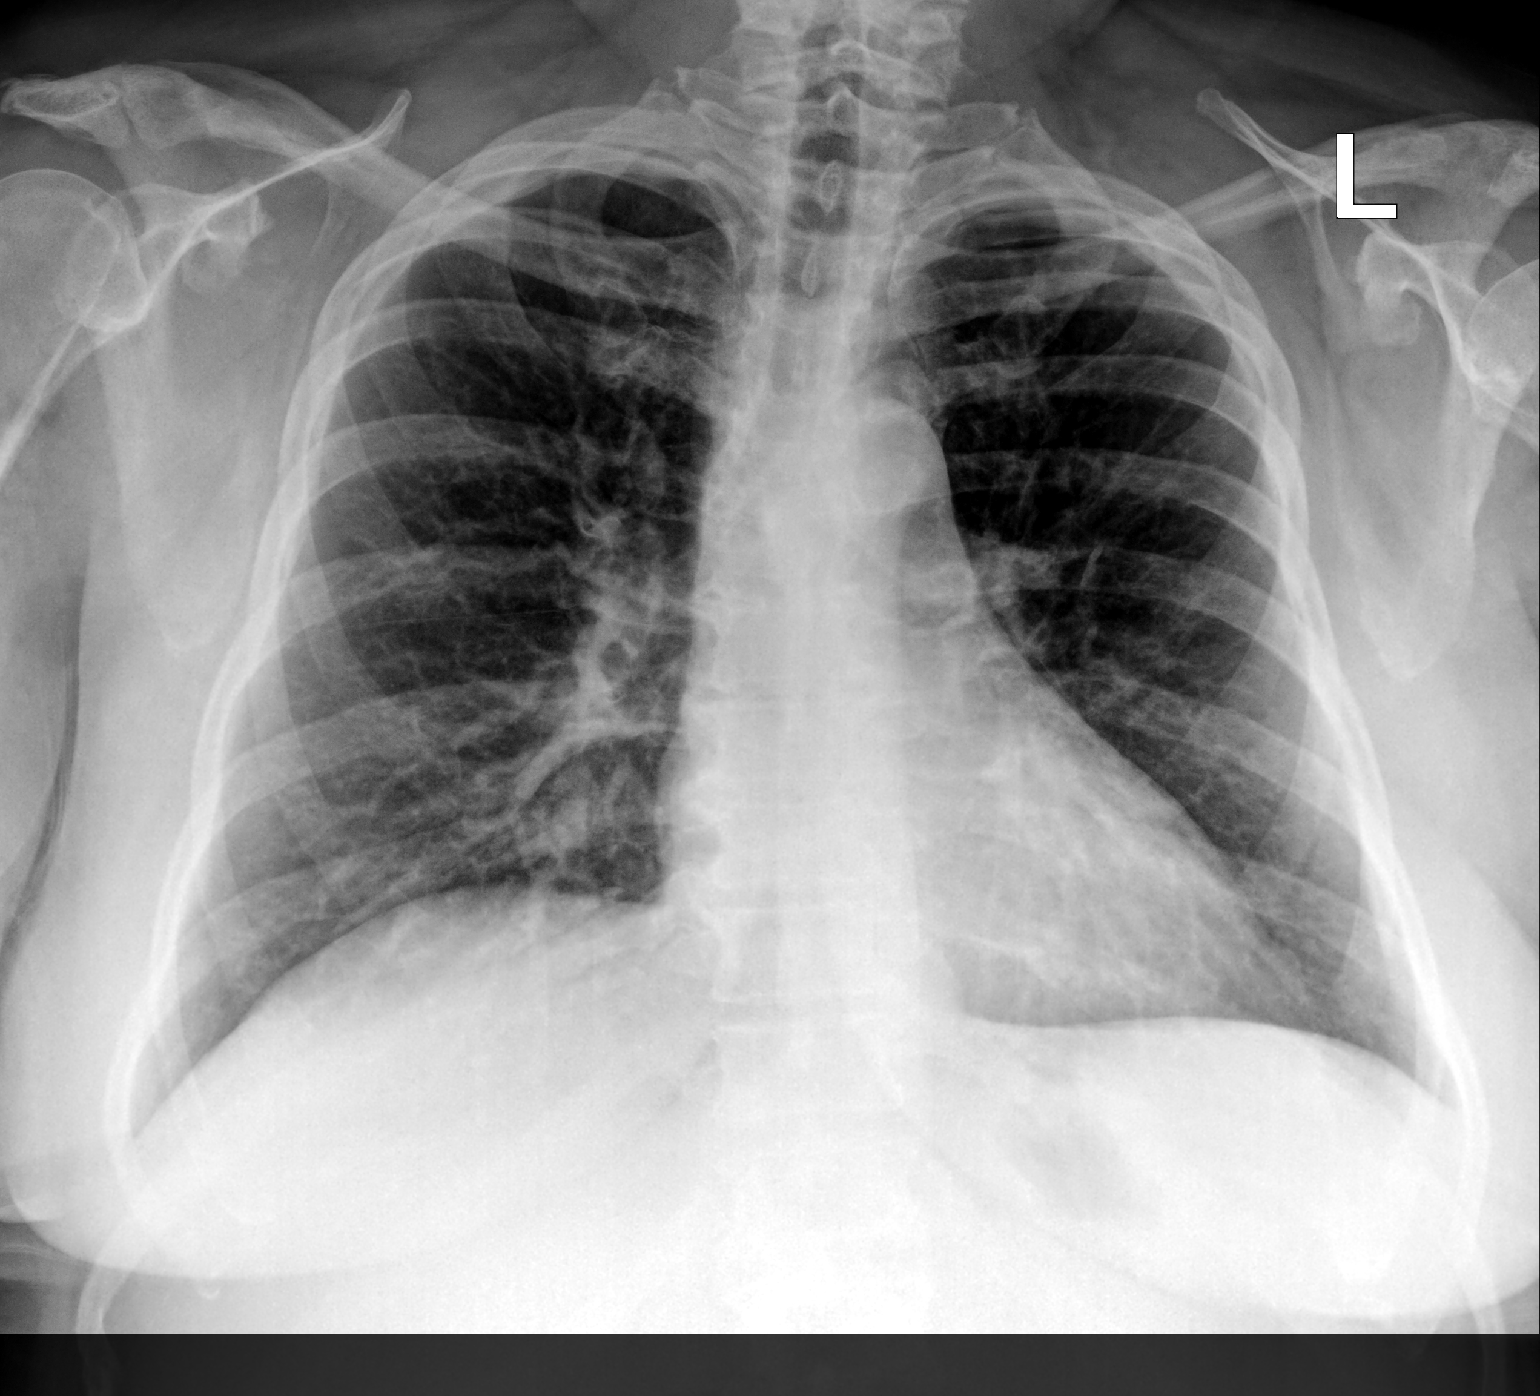

[chest lat]
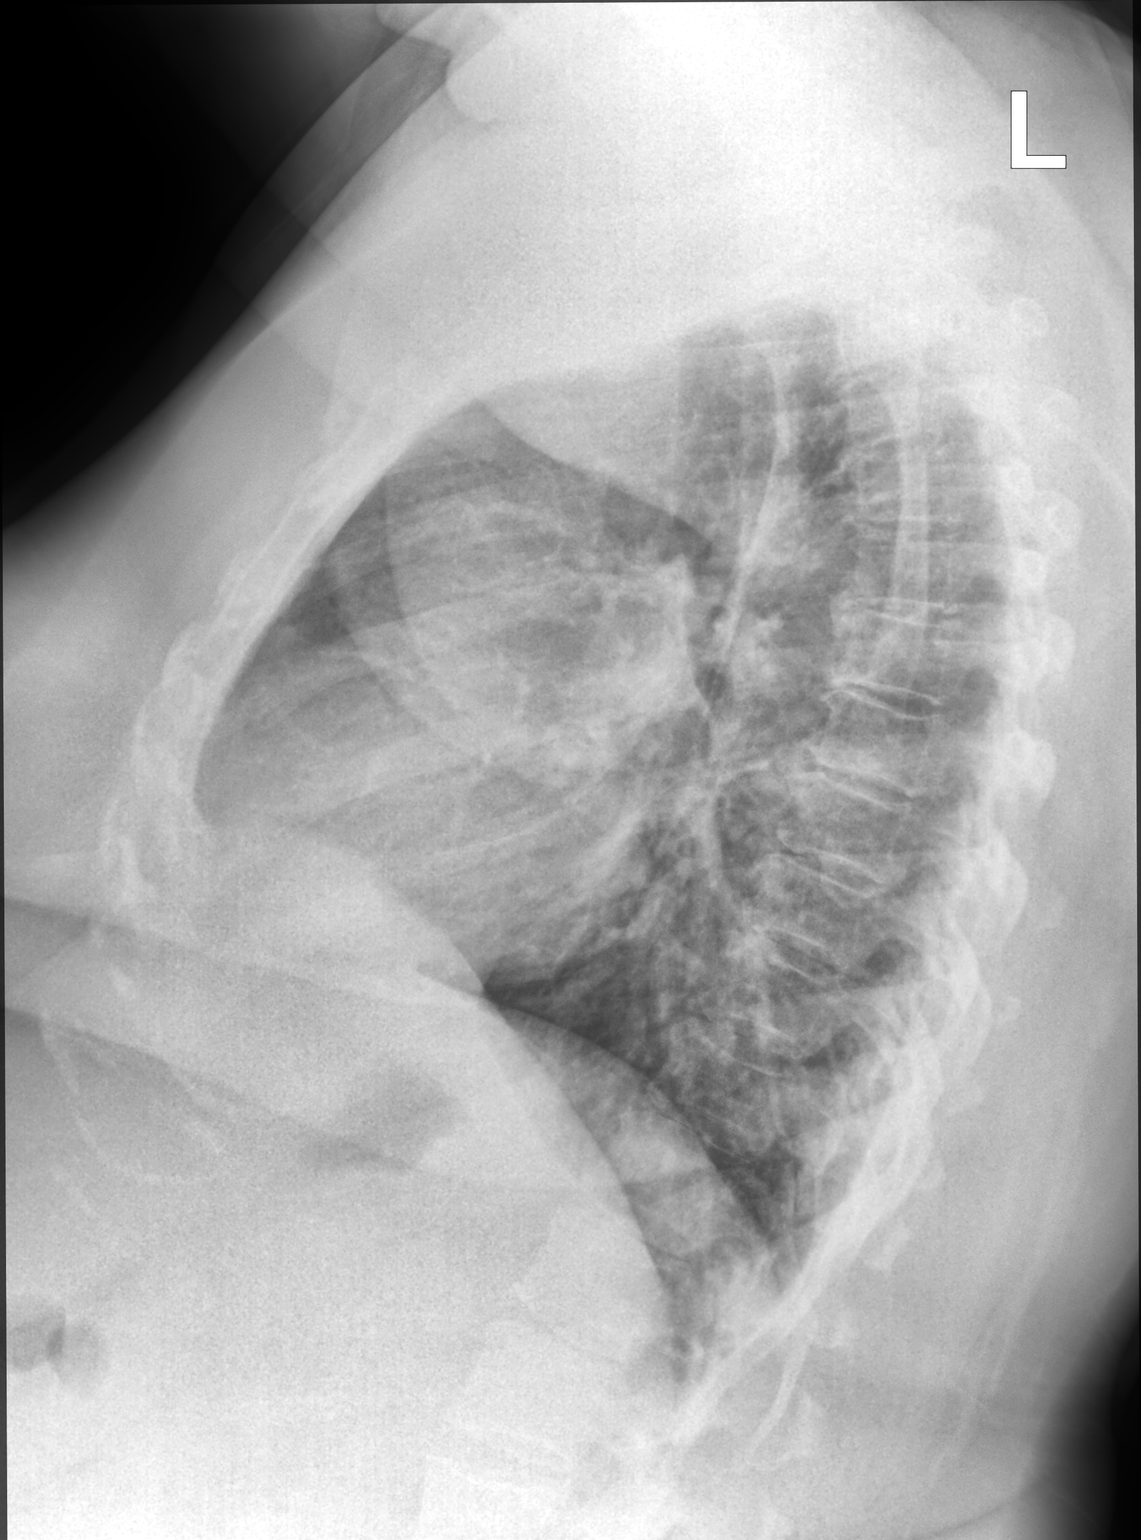

[2 of 2 positions shown; findings below may reference images not displayed]

FINDINGS: The heart size and mediastinal contours are within normal limits.
Both lungs are clear except for slight peribronchial thickening. No
effusions. The visualized skeletal structures are unremarkable.
IMPRESSION: Slight bronchitic changes.

## 2021-09-02 ENCOUNTER — Telehealth: Payer: Medicaid Other | Admitting: Emergency Medicine

## 2021-09-02 DIAGNOSIS — R300 Dysuria: Secondary | ICD-10-CM

## 2021-09-02 NOTE — Progress Notes (Signed)
Based on what you shared with me, I feel your condition warrants further evaluation and I recommend that you be seen in a face to face visit. Because you've recently had a tube in your bladder and because you are already on antibiotics, it makes this a more complicated infection. You should have your urine checked and cultured to make sure you're getting the right antibiotic, and at this time, these tests can only be done in person. I suggest an urgent care visit.  ?  ?NOTE: There will be NO CHARGE for this eVisit ?  ?If you are having a true medical emergency please call 911.   ?  ? For an urgent face to face visit, [facility] has six urgent care centers for your convenience:  ?  ? [facility] ?Get Driving Directions ?[phone] ?[address] ?[place], [place] [address] ?  ? [facility]) ?Get Driving Directions ?[phone] ?[address] ?[place], [place] [address] ? ?[facility] ?Get Driving Directions ?[address],  [place]  [address] ? ?[facility] ?Get Driving Directions ?[phone] ?[address], [address] ?[place], [place] [address] ?  ?[facility] [place] [facility] ?Get Driving Directions  ?[phone] ?[address] ?[address] ?[place], [place] [address] ?  ?[facility] ?Get Driving Directions ?[phone] ?[address] ?[place],  [address] ? ?Your MyChart E-visit questionnaire answers were reviewed by a board certified advanced clinical practitioner to complete your personal care plan based on your specific symptoms.  Thank you for using e-Visits. ?  ? ?

## 2021-09-18 NOTE — Progress Notes (Signed)
?Cardiology Office Note:   ? ?Date:  09/19/2021  ? ?ID:  Christina Cameron, DOB 06/28/1965, MRN [id number] ? ?PCP:  Lanelle Bal, PA-C ?  ?[facility] Providers ?Cardiologist:  None    ? ?Referring MD: Bonnita Hollow, MD  ? ?CC: Return to care ?Consulted for the evaluation of HF at the behest of [place], [place], [place] ? ?History of Present Illness:   ? ?Christina Cameron is a 56 y.o. female with a hx of HFpEF, HTN with DM, Morbid Obesity, HLD with DM, and P-SVT who presents to re-establish care. ? ?Patient notes that she is feeling tired all the time and restless.  Has had return of chest tightness in the center of her chest associated with dizziness.  This has happened several times.  This is different from her palpitations.  No radiation.  Occur at night; started a month ago.  No provocation with exertions. Patient exertion notable for ADLS only and still feels SOB.  No shortness of breath at rest, but DOE with minimal exertion.  Has lasix if needed but rarely takes it (once a week or so).  Notes PND and orthopnea.    No weight gain, notes occasional leg swelling No syncope . Notes palpitations with exertion. ? ?Palpitations are similar to 2021.  Chest tightness unchanged from 2016. ? ?She is going through a lot of stress- her husband has had lung cancer. ? ? ?Past Medical History:  ?Diagnosis Date  ? Arm vein blood clot, unspecified laterality   ? around 20 years ago as of 08/10/21, Patient doesn't remember what caused it. She was not put on blood thinners.  ? Arthritis   ? right knee  ? Asthma   ? Cancer [facility])   ? cervical 1989  ? Chest pain   ? a. normal cors by cath in 11/2014 / 03/02/2020 nuclear stress test demonstrated no perfusion defects consistent with prior infart or current ischemia. Normal study.  ? Chronic diastolic (congestive) heart failure (HCC)   ? 09/27/20 Echocardiogram in Epic showed LVEF 60 to 65%.  ? Complication of anesthesia   ? post-operative nausea & vomiting  ? COVID-19   ? 05/29/19 & 05/2020 Covid  infections  ? Depression   ? Diabetes mellitus, type 2 ([place])   ? Dysrhythmia   ? heart palpitations  ? H/O cardiovascular stress test 03/02/2020  ? 03/02/2020 Nuclear stress test in Epic showed no perusion defects consistent with prior infarct or ischemia - normal study.  ? Headache   ? migraines  ? High cholesterol   ? pt takes Crestor  ? Hypertension   ? Last cardiology office visit, 10/22/20 with [name], NP ( see note in Epic) as of 08/09/21.  ? Morbid (severe) obesity due to excess calories ([place])   ? Neuromuscular disorder ([place])   ? severe neuropathy in feet  ? Osteoarthritis of knee, unspecified   ? Palpitations   ? PONV (postoperative nausea and vomiting)   ? Post-COVID syndrome 05/2019  ? Pt experienced chest tightness, sob, palpitations & dizziness after Covid infection. See 04/2020 note from [name], NP in [place].  ? PTSD (post-traumatic stress disorder)   ? Raynaud's syndrome   ? Sleep apnea 09/24/2020  ? sleep study indicated moderate sleep apea  ? SVT (supraventricular tachycardia) ([place])   ? a. s/p ablation by Dr. Lovena Le in 2006.  ? Wears glasses   ? prescripton reading glasses  ? ? ?Past Surgical History:  ?Procedure Laterality Date  ? CARDIAC  CATHETERIZATION N/A 12/14/2014  ? Procedure: Right/Left Heart Cath and Coronary Angiography;  Surgeon: Sherren Mocha, MD;  Location: [facility];  Service: Cardiovascular;  Laterality: N/A;  ? CARDIAC ELECTROPHYSIOLOGY STUDY AND ABLATION    ? around 2006, Patient states that heart rate was up to 180 bpm.  ? CESAREAN SECTION    ? x2 1990, 2005  ? CYSTOSCOPY  08/17/2021  ? Procedure: CYSTOSCOPY;  Surgeon: Bobbye Charleston, MD;  Location: [facility];  Service: Gynecology;;  ? ENDOMETRIAL ABLATION    ? HYSTEROSCOPY WITH D & C N/A 03/11/2021  ? Procedure: DILATATION AND CURETTAGE /HYSTEROSCOPY;  Surgeon: Jerelyn Charles, MD;  Location: [facility];  Service: Gynecology;  Laterality: N/A;  ? OPERATIVE ULTRASOUND N/A 03/11/2021  ? Procedure:  OPERATIVE ULTRASOUND;  Surgeon: Jerelyn Charles, MD;  Location: [facility];  Service: Gynecology;  Laterality: N/A;  ultrasound guidance needed  ? ROBOTIC ASSISTED LAPAROSCOPIC HYSTERECTOMY AND SALPINGECTOMY Bilateral 08/17/2021  ? Procedure: XI ROBOTIC ASSISTED LAPAROSCOPIC HYSTERECTOMY AND  BILATERAL SALPINGECTOMY AND OOPHERETOMY;  Surgeon: Bobbye Charleston, MD;  Location: [facility];  Service: Gynecology;  Laterality: Bilateral;  ? ? ?Current Medications: ?Current Meds  ?Medication Sig  ? albuterol (VENTOLIN HFA) 108 (90 Base) MCG/ACT inhaler Inhale 1-2 puffs into the lungs every 6 (six) hours as needed for wheezing or shortness of breath.  ? Azelastine-Fluticasone 137-50 MCG/ACT SUSP Place 1 spray into the nose every 12 (twelve) hours. (Patient taking differently: Place 1 spray into the nose as needed.)  ? busPIRone (BUSPAR) 10 MG tablet Take 10 mg by mouth 2 (two) times daily.  ? cetirizine (ZYRTEC) 10 MG tablet Take 10 mg by mouth daily as needed for allergies.  ? diltiazem (CARDIZEM) 90 MG tablet Take 1 tablet (90 mg total) by mouth 2 (two) times daily.  ? FARXIGA 10 MG TABS tablet Take 10 mg by mouth daily.  ? indapamide (LOZOL) 2.5 MG tablet Take 2.5 mg by mouth daily as needed (fluid).  ? irbesartan (AVAPRO) 300 MG tablet Take 300 mg by mouth daily.  ? metFORMIN (GLUCOPHAGE) 500 MG tablet Take 1,000 mg by mouth 2 (two) times daily with a meal.  ? nitroGLYCERIN (NITROSTAT) 0.4 MG SL tablet Place 1 tablet (0.4 mg total) under the tongue every 5 (five) minutes as needed for chest pain. Up to 3 (three) tablets  ? oxycodone (OXY-IR) 5 MG capsule Take 1 capsule (5 mg total) by mouth every 4 (four) hours as needed.  ? rosuvastatin (CRESTOR) 40 MG tablet Take 20 mg by mouth 2 (two) times daily.  ? Semaglutide, 2 MG/DOSE, (OZEMPIC, 2 MG/DOSE,) 8 MG/3ML SOPN Inject into the skin. Take on Friday.  ? sertraline (ZOLOFT) 100 MG tablet Take 150 mg by mouth daily.  ? traZODone (DESYREL) 50 MG tablet Take 50 mg  by mouth at bedtime as needed for sleep.  ? [DISCONTINUED] furosemide (LASIX) 20 MG tablet Take 20 mg by mouth as needed.  ? [DISCONTINUED] metoprolol tartrate (LOPRESSOR) 50 MG tablet Take 100 mg by mouth 2 (two) times daily.  ?  ? ?Allergies:   Patient has no active allergies.  ? ?Social History  ? ?Socioeconomic History  ? Marital status: Married  ?  Spouse name: Not on file  ? Number of children: Not on file  ? Years of education: Not on file  ? Highest education level: Not on file  ?Occupational History  ? Not on file  ?Tobacco Use  ? Smoking status: Never  ? Smokeless  tobacco: Never  ?Vaping Use  ? Vaping Use: Never used  ?Substance and Sexual Activity  ? Alcohol use: Not Currently  ?  Comment: seldom  ? Drug use: No  ? Sexual activity: Yes  ?  Birth control/protection: None  ?Other Topics Concern  ? Not on file  ?Social History Narrative  ? Not on file  ? ?Social Determinants of Health  ? ?Financial Resource Strain: Not on file  ?Food Insecurity: Not on file  ?Transportation Needs: Not on file  ?Physical Activity: Not on file  ?Stress: Not on file  ?Social Connections: Not on file  ?  ? ?Family History: ?The patient's family history includes Cancer in her father and mother; Hypertension in her brother; Stroke in her brother. ? ?ROS:   ?Please see the history of present illness.    ? All other systems reviewed and are negative. ? ?EKGs/Labs/Other Studies Reviewed:   ? ?The following studies were reviewed today: ? ?EKG:  EKG is  ordered today.  The ekg ordered today demonstrates  ?09/19/21:  SR 77 Low voltage anterior infract pattern ? ?Cardiac Event Monitoring: ?Date: 09/23/19 ?Results: ?P-AT (Brief) ? ?Transthoracic Echocardiogram: ?Date: 10/20/20 ?Results: ?1. Left ventricular ejection fraction, by estimation, is 60 to 65%. The  ?left ventricle has normal function. The left ventricle has no regional  ?wall motion abnormalities. There is mild left ventricular hypertrophy.  ?Left ventricular diastolic parameters   ?were normal. The average left ventricular global longitudinal strain is  ?18.1 %. The global longitudinal strain is normal.  ? 2. Right ventricular systolic function is normal. The right ventricular  ?size

## 2021-09-19 ENCOUNTER — Encounter: Payer: Self-pay | Admitting: Internal Medicine

## 2021-09-19 ENCOUNTER — Ambulatory Visit (INDEPENDENT_AMBULATORY_CARE_PROVIDER_SITE_OTHER): Payer: Medicare Other | Admitting: Internal Medicine

## 2021-09-19 VITALS — BP 140/84 | HR 77 | Ht 64.0 in | Wt 265.2 lb

## 2021-09-19 DIAGNOSIS — R072 Precordial pain: Secondary | ICD-10-CM

## 2021-09-19 DIAGNOSIS — E785 Hyperlipidemia, unspecified: Secondary | ICD-10-CM | POA: Diagnosis not present

## 2021-09-19 DIAGNOSIS — R002 Palpitations: Secondary | ICD-10-CM

## 2021-09-19 DIAGNOSIS — R0609 Other forms of dyspnea: Secondary | ICD-10-CM

## 2021-09-19 DIAGNOSIS — I10 Essential (primary) hypertension: Secondary | ICD-10-CM | POA: Diagnosis not present

## 2021-09-19 DIAGNOSIS — I5030 Unspecified diastolic (congestive) heart failure: Secondary | ICD-10-CM | POA: Insufficient documentation

## 2021-09-19 MED ORDER — FUROSEMIDE 20 MG PO TABS: 20.0000 mg | ORAL_TABLET | Freq: Every day | ORAL | 3 refills | Status: AC

## 2021-09-19 MED ORDER — METOPROLOL TARTRATE 50 MG PO TABS: 100.0000 mg | ORAL_TABLET | Freq: Two times a day (BID) | ORAL | 3 refills | Status: DC

## 2021-09-19 MED ORDER — NITROGLYCERIN 0.4 MG SL SUBL: 0.4000 mg | SUBLINGUAL_TABLET | SUBLINGUAL | 3 refills | Status: DC | PRN

## 2021-09-19 NOTE — Patient Instructions (Addendum)
Medication Instructions:  ?Start Lasix 20 mg tablets by mouth daily ? ?Labwork: ?BNP ?BMP ?Lipids (FASTING)- 2 weeks. ? ?Testing/Procedures: ?Cardiac CT ? ?Follow-Up: ?Follow up with Dr. Gasper Sells or APP in the Fall.  ? ?Any Other Special Instructions Will Be Listed Below (If Applicable). ? ? ? ? ?If you need a refill on your cardiac medications before your next appointment, please call your pharmacy. ? ? ? ? ?Your cardiac CT will be scheduled at one of the below locations:  ? ?[facility] ?[address] ?[place], [place] [address] ?[phone] ? ?OR ? ? ?[facility] ?[address] ?[place], [place] [address] ?[phone] ? ?If scheduled at [facility], please arrive at the [facility] and Children's Entrance (Entrance C2) of [facility] 30 minutes prior to test start time. ?You can use the FREE valet parking offered at entrance C (encouraged to control the heart rate for the test)  ?Proceed to the [facility] Radiology Department (first floor) to check-in and test prep. ? ?All radiology patients and guests should use entrance C2 at [facility], accessed from [facility], even though the hospital's physical address listed is [address]. ? ? ? ?If scheduled at [facility], please arrive 15 mins early for check-in and test prep. ? ?Please follow these instructions carefully (unless otherwise directed): ? ?Hold all erectile dysfunction medications at least 3 days (72 hrs) prior to test. ? ?On the Night Before the Test: ?Be sure to Drink plenty of water. ?Do not consume any caffeinated/decaffeinated beverages or chocolate 12 hours prior to your test. ?Do not take any antihistamines 12 hours prior to your test. ?If the patient has contrast allergy: ?Patient will need a prescription for Prednisone and very clear instructions (as follows): ?Prednisone 50 mg - take 13 hours prior to  test ?Take another Prednisone 50 mg 7 hours prior to test ?Take another Prednisone 50 mg 1 hour prior to test ?Take Benadryl 50 mg 1 hour prior to test ?Patient must complete all four doses of above prophylactic medications. ?Patient will need a ride after test due to Benadryl. ? ?On the Day of the Test: ?Drink plenty of water until 1 hour prior to the test. ?Do not eat any food 4 hours prior to the test. ?You may take your regular medications prior to the test.  ?Take metoprolol (Lopressor) two hours prior to test. ?HOLD Furosemide/Hydrochlorothiazide morning of the test. ?FEMALES- please wear underwire-free bra if available, avoid dresses & tight clothing ? ? ?*For Clinical Staff only. Please instruct patient the following:* ?Heart Rate Medication Recommendations for Cardiac CT  ?Resting HR < 50 bpm  ?No medication  ?Resting HR 50-60 bpm and BP >110/50 mmHG   ?Consider Metoprolol tartrate 25 mg PO 90-120 min prior to scan  ?Resting HR 60-65 bpm and BP >110/50 mmHG  ?Metoprolol tartrate 50 mg PO 90-120 minutes prior to scan   ?Resting HR > 65 bpm and BP >110/50 mmHG  ?Metoprolol tartrate 100 mg PO 90-120 minutes prior to scan  ?Consider Ivabradine 10-15 mg PO or a calcium channel blocker for resting HR >60 bpm and contraindication to metoprolol tartrate  ?Consider Ivabradine 10-15 mg PO in combination with metoprolol tartrate for HR >80 bpm   ? ?     ?After the Test: ?Drink plenty of water. ?After receiving IV contrast, you may experience a mild flushed feeling. This is normal. ?On occasion, you may experience a  mild rash up to 24 hours after the test. This is not dangerous. If this occurs, you can take Benadryl 25 mg and increase your fluid intake. ?If you experience trouble breathing, this can be serious. If it is severe call 911 IMMEDIATELY. If it is mild, please call our office. ?If you take any of these medications: Glipizide/Metformin, Avandament, Glucavance, please do not take 48 hours after completing test  unless otherwise instructed. ? ?We will call to schedule your test 2-4 weeks out understanding that some insurance companies will need an authorization prior to the service being performed.  ? ?For non-scheduling related questions, please contact the cardiac imaging nurse navigator should you have any questions/concerns: ?Marchia Bond, Cardiac Imaging Nurse Navigator ?Gordy Clement, Cardiac Imaging Nurse Navigator ?[facility] ?Direct Office Dial: [phone]  ? ?For scheduling needs, including cancellations and rescheduling, please call [place], [phone]. ? ? ? ? ?Your cardiac CT will be scheduled at one of the below locations:  ? ?[facility] ?[address] ?[place], [place] [address] ?[phone] ? ?OR ? ?[facility] ?[address] [place] ?[address] ?[place], [place] [address] ?[phone] ? ?If scheduled at [facility], please arrive at the [facility] and Children's Entrance (Entrance C2) of [facility] 30 minutes prior to test start time. ?You can use the FREE valet parking offered at entrance C (encouraged to control the heart rate for the test)  ?Proceed to the [facility] Radiology Department (first floor) to check-in and test prep. ? ?All radiology patients and guests should use entrance C2 at [facility], accessed from [facility], even though the hospital's physical address listed is [address] ? ? ? ?If scheduled at [facility], please arrive 15 mins early for check-in and test prep. ? ?Please follow these instructions carefully (unless otherwise directed): ? ?TAKE Metoprolol 100 mg 2 hours prior to CT scan (2 tablets of 50 mg ) ? ?On the Night Before the Test: ?Be sure to Drink plenty of water. ?Do not consume any caffeinated/decaffeinated beverages or chocolate 12 hours prior to your test. ?Do not take any antihistamines 12 hours prior to your test. ? ?On  the Day of the Test: ?Drink plenty of water until 1 hour prior to the test. ?Do not eat any food 4 hours prior to the test. ?You may take your regular medications prior to the test.  ?Take metoprolol (Lopressor) two hours prior to test. ?HOLD Furosemide/Hydrochlorothiazide morning of the test. ?FEMALES- please wear underwire-free bra if available, avoid dresses & tight clothing ?   ?After the Test: ?Drink plenty of water. ?After receiving IV contrast, you may experience a mild flushed feeling. This is normal. ?On occasion, you may experience a mild rash up to 24 hours after the test. This is not dangerous. If this occurs, you can take Benadryl 25 mg and increase your fluid intake. ?If you experience trouble breathing, this can be serious. If it is severe call 911 IMMEDIATELY. If it is mild, please call our office. ?If you take any of these medications: Glipizide/Metformin, Avandament, Glucavance, please do not take 48 hours after completing test unless otherwise instructed. ? ?We will call to schedule your test 2-4 weeks out understanding that some insurance companies will need an authorization prior to the service being performed.  ? ?For non-scheduling related questions, please contact the cardiac imaging nurse navigator should you have any questions/concerns: ?Marchia Bond, Cardiac Imaging Nurse Navigator ?  Gordy Clement, Cardiac Imaging Nurse Navigator ?[facility] ?Direct Office Dial: [phone]  ? ?For scheduling needs, including cancellations and rescheduling, please call [place], [phone]. ? ?

## 2021-09-29 ENCOUNTER — Telehealth (HOSPITAL_COMMUNITY): Payer: Self-pay | Admitting: *Deleted

## 2021-09-29 NOTE — Telephone Encounter (Signed)
Reaching out to patient to offer assistance regarding upcoming cardiac imaging study; pt verbalizes understanding of appt date/time, but wishes to reschedule due to her husband's radiation treatment.   ? ?New appointment made for May 17. She is aware to obtain labs from [facility] prior to her appt. ? ?Gordy Clement RN Navigator Cardiac Imaging ?[facility] ?[phone] office ?[phone] cell ? ?

## 2021-10-03 ENCOUNTER — Ambulatory Visit (HOSPITAL_COMMUNITY): Admission: RE | Admit: 2021-10-03 | Payer: Medicare Other | Source: Ambulatory Visit

## 2021-10-05 ENCOUNTER — Other Ambulatory Visit (HOSPITAL_COMMUNITY)
Admission: RE | Admit: 2021-10-05 | Discharge: 2021-10-05 | Disposition: A | Discharge status: Home / Self Care | Payer: Medicare Other | Source: Ambulatory Visit | Attending: Internal Medicine | Admitting: Internal Medicine

## 2021-10-05 DIAGNOSIS — E785 Hyperlipidemia, unspecified: Secondary | ICD-10-CM | POA: Diagnosis present

## 2021-10-05 DIAGNOSIS — R002 Palpitations: Secondary | ICD-10-CM | POA: Diagnosis present

## 2021-10-05 DIAGNOSIS — I5032 Chronic diastolic (congestive) heart failure: Secondary | ICD-10-CM | POA: Diagnosis not present

## 2021-10-05 LAB — BASIC METABOLIC PANEL
Anion gap: 7 (ref 5–15)
BUN: 17 mg/dL (ref 6–20)
CO2: 26 mmol/L (ref 22–32)
Calcium: 8.9 mg/dL (ref 8.9–10.3)
Chloride: 105 mmol/L (ref 98–111)
Creatinine, Ser: 0.76 mg/dL (ref 0.44–1.00)
GFR, Estimated: >60 mL/min (ref >60)
Glucose, Bld: 120 mg/dL — ABNORMAL HIGH (ref 70–99)
Potassium: 4.2 mmol/L (ref 3.5–5.1)
Sodium: 138 mmol/L (ref 135–145)

## 2021-10-05 LAB — LIPID PANEL
Cholesterol: 263 mg/dL — ABNORMAL HIGH (ref 0–200)
HDL: 48 mg/dL (ref >40)
LDL Cholesterol: 182 mg/dL — ABNORMAL HIGH (ref 0–99)
Total CHOL/HDL Ratio: 5.5 RATIO
Triglycerides: 167 mg/dL — ABNORMAL HIGH (ref <150)
VLDL: 33 mg/dL (ref 0–40)

## 2021-10-05 LAB — BRAIN NATRIURETIC PEPTIDE: B Natriuretic Peptide: 53 pg/mL (ref 0.0–100.0)

## 2021-10-10 ENCOUNTER — Telehealth (HOSPITAL_COMMUNITY): Payer: Self-pay | Admitting: Emergency Medicine

## 2021-10-10 NOTE — Telephone Encounter (Signed)
Attempted to call patient regarding upcoming cardiac CT appointment. °Left message on voicemail with name and callback number °Kaycie Pegues RN Navigator Cardiac Imaging °[facility] °[phone] Office °[phone] Cell ° °

## 2021-10-12 ENCOUNTER — Ambulatory Visit (HOSPITAL_COMMUNITY): Admission: RE | Admit: 2021-10-12 | Payer: Medicare Other | Source: Ambulatory Visit

## 2021-10-14 ENCOUNTER — Telehealth: Payer: Self-pay

## 2021-10-14 MED ORDER — EZETIMIBE 10 MG PO TABS: 10.0000 mg | ORAL_TABLET | Freq: Every day | ORAL | 3 refills | Status: AC

## 2021-10-14 NOTE — Telephone Encounter (Signed)
Results: LDL is worse BNP WNL BMP grossly normal Plan: Is patient taking her statin?, if so add zetia 10 mg PO Daily Will not add MRA at this time   Werner Lean, MD     Pt notified and verbalized understanding. Pt agreeable to try Zetia 10 mg tablets. Pt is currently taking Crestor 40 mg tablets PO QD. Pt had no questions or concerns at this time.

## 2021-11-01 ENCOUNTER — Telehealth (HOSPITAL_COMMUNITY): Payer: Self-pay | Admitting: Emergency Medicine

## 2021-11-01 NOTE — Telephone Encounter (Signed)
Reaching out to patient to offer assistance regarding upcoming cardiac imaging study; pt verbalizes understanding of appt date/time, parking situation and where to check in, pre-test NPO status and medications ordered, and verified current allergies; name and call back number provided for further questions should they arise Christina Bond RN Navigator Cardiac Imaging [name] [phone] office [phone] cell  '100mg'$  metoprolol tartrate Arrival 1030 Denies iv issues

## 2021-11-02 ENCOUNTER — Ambulatory Visit (HOSPITAL_COMMUNITY)
Admission: RE | Admit: 2021-11-02 | Discharge: 2021-11-02 | Disposition: A | Discharge status: Home / Self Care | Payer: Medicare Other | Source: Ambulatory Visit | Attending: Internal Medicine | Admitting: Internal Medicine

## 2021-11-02 DIAGNOSIS — I5030 Unspecified diastolic (congestive) heart failure: Secondary | ICD-10-CM | POA: Insufficient documentation

## 2021-11-02 MED ORDER — NITROGLYCERIN 0.4 MG SL SUBL
0.8000 mg | SUBLINGUAL_TABLET | Freq: Once | SUBLINGUAL | Status: AC
  Administered 2021-11-02: 0.8 mg via SUBLINGUAL

## 2021-11-02 MED ORDER — IOHEXOL 350 MG/ML SOLN
100.0000 mL | Freq: Once | INTRAVENOUS | Status: AC | PRN
  Administered 2021-11-02: 100 mL via INTRAVENOUS

## 2021-11-02 MED ORDER — NITROGLYCERIN 0.4 MG SL SUBL
SUBLINGUAL_TABLET | SUBLINGUAL | Status: AC
  Filled 2021-11-02: qty 2

## 2021-11-03 ENCOUNTER — Other Ambulatory Visit: Payer: Self-pay | Admitting: Podiatry

## 2021-11-30 ENCOUNTER — Telehealth: Payer: Medicare Other | Admitting: Physician Assistant

## 2021-11-30 DIAGNOSIS — K047 Periapical abscess without sinus: Secondary | ICD-10-CM

## 2021-11-30 MED ORDER — DOXYCYCLINE HYCLATE 100 MG PO TABS
100.0000 mg | ORAL_TABLET | Freq: Two times a day (BID) | ORAL | 0 refills | Status: DC
Start: 2021-11-30 — End: 2022-01-18

## 2021-11-30 NOTE — Progress Notes (Signed)

## 2022-01-06 ENCOUNTER — Ambulatory Visit: Payer: Medicare Other | Admitting: Podiatry

## 2022-01-13 ENCOUNTER — Other Ambulatory Visit: Payer: Self-pay | Admitting: *Deleted

## 2022-01-13 MED ORDER — DILTIAZEM HCL 90 MG PO TABS: 90.0000 mg | ORAL_TABLET | Freq: Two times a day (BID) | ORAL | 6 refills | Status: DC

## 2022-01-17 ENCOUNTER — Telehealth: Payer: Medicare Other | Admitting: Physician Assistant

## 2022-01-17 DIAGNOSIS — K047 Periapical abscess without sinus: Secondary | ICD-10-CM | POA: Diagnosis not present

## 2022-01-18 MED ORDER — PENICILLIN V POTASSIUM 500 MG PO TABS: 500.0000 mg | ORAL_TABLET | Freq: Three times a day (TID) | ORAL | 0 refills | Status: AC

## 2022-01-18 MED ORDER — IBUPROFEN 600 MG PO TABS: 600.0000 mg | ORAL_TABLET | Freq: Three times a day (TID) | ORAL | 0 refills | Status: DC | PRN

## 2022-01-18 NOTE — Progress Notes (Signed)

## 2022-01-23 ENCOUNTER — Encounter: Payer: Self-pay | Admitting: Primary Care

## 2022-01-23 ENCOUNTER — Ambulatory Visit (INDEPENDENT_AMBULATORY_CARE_PROVIDER_SITE_OTHER): Payer: Medicare Other | Admitting: Primary Care

## 2022-01-23 DIAGNOSIS — G4733 Obstructive sleep apnea (adult) (pediatric): Secondary | ICD-10-CM | POA: Insufficient documentation

## 2022-01-23 NOTE — Progress Notes (Signed)
Reviewed and agree with assessment/plan.   Chesley Mires, MD [facility] Pulmonary/Critical Care 01/23/2022, 2:13 PM Pager:  [phone]

## 2022-01-23 NOTE — Progress Notes (Signed)
$'[name]$  ID: Christina Cameron, female    DOB: 05-15-1966, 56 y.o.   MRN: [id number]  Chief Complaint  Patient presents with   Follow-up    Referring provider: Fonnie Mu  HPI: 56 year old female, never smoked. PMH significant for chronic diastolic heart failure, HTn, asthma, DM type 2, mixed hyperlipidemia, obesity. Patient of Dr. Halford Chessman, seen for initial sleep consult on 08/25/20.   Previous [facility] pulmonary encounter: She was seen by Dr. Melvyn Novas in April 2021.  At the time concern was for upper airway cough syndrome versus cough variant asthma.  Also concern ACE inhibitor cough.  She had COVID 19 infection in December 2020 and much of her cough symptoms occurred after this.  She was more recently seen by cardiology palpitations and there was concern she could have sleep apnea.  Her daughter had severe sleep apnea and died in her sleep after getting a respiratory infection.  She snores and wakes up hearing herself snore.  She is a restless sleeper.  She will wake up feeling her heart race.  She is tired all the time, and can fall asleep whenever she is sitting.  She goes to sleep between 1 to 3 am.  She falls asleep in few minutes.  She wakes up 4 to 5 times to use the bathroom.  She gets out of bed at 7 am.  She feels tired in the morning.  She gets frequent migraine headaches.  She does not use anything to help her fall sleep or stay awake.  She will talk in her sleep.  She denies sleep walking, bruxism, or nightmares.  There is no history of restless legs.  She denies sleep hallucinations, sleep paralysis, or cataplexy.  The Epworth score is 20 out of 24.  10/18/2020 Patient contacted today to review sleep study results. HST 09/24/20 showed moderate OSA, AHI 16/hr. She reports symptoms of snoring and restless sleep. Her daughter recently passed away, she had severe sleep apnea. We discussed treatment options including weight loss, oral appliance, CPAP therapy and referral to ENT for possible  surgical options. She is somewhat hesitant to try CPAP but would be open to using nasal mask if able.   01/23/2022 - Interim hx  Patient presents today for overdue follow-up for OSA.  She has a history of moderate obstructive sleep apnea and is intolerant to CPAP due to PTSD symptoms.  She has not worn her CPAP in over a year. She has tried several different masks without success. She is interested in other options for treatment of OSA.  HST 09/24/20 showed moderate OSA, AHI 16/hr (weight 272lbs). We reviewed options including weight loss, oral appliance or referral to ENT for possible surgical options. She is working on weight loss. She is on ozempic for diabetes, dose was recently increased. She needs bilateral knee replacements and is working on weight loss. She will be seeing her dentist in two weeks and will ask them if they feel she could use an oral appliance.     No Known Allergies  Immunization History  Administered Date(s) Administered   Influenza-Unspecified 03/02/2016, 04/16/2019   Moderna Sars-Covid-2 Vaccination 08/17/2019    Past Medical History:  Diagnosis Date   Arm vein blood clot, unspecified laterality    around 20 years ago as of 08/10/21, Patient doesn't remember what caused it. She was not put on blood thinners.   Arthritis    right knee   Asthma    Cancer ([place])    cervical 1989  Chest pain    a. normal cors by cath in 11/2014 / 03/02/2020 nuclear stress test demonstrated no perfusion defects consistent with prior infart or current ischemia. Normal study.   Chronic diastolic (congestive) heart failure (HCC)    09/27/20 Echocardiogram in Epic showed LVEF 60 to 65%.   Complication of anesthesia    post-operative nausea & vomiting   COVID-19    05/29/19 & 05/2020 Covid infections   Depression    Diabetes mellitus, type 2 ([place])    Dysrhythmia    heart palpitations   H/O cardiovascular stress test 03/02/2020   03/02/2020 Nuclear stress test in Epic showed no perusion  defects consistent with prior infarct or ischemia - normal study.   Headache    migraines   High cholesterol    pt takes Crestor   Hypertension    Last cardiology office visit, 10/22/20 with [name], NP ( see note in [place]) as of 08/09/21.   Morbid (severe) obesity due to excess calories (HCC)    Neuromuscular disorder (HCC)    severe neuropathy in feet   Osteoarthritis of knee, unspecified    Palpitations    PONV (postoperative nausea and vomiting)    Post-COVID syndrome 05/2019   Pt experienced chest tightness, sob, palpitations & dizziness after Covid infection. See 04/2020 note from [name], NP in [place].   PTSD (post-traumatic stress disorder)    Raynaud's syndrome    Sleep apnea 09/24/2020   sleep study indicated moderate sleep apea   SVT (supraventricular tachycardia) ([place])    a. s/p ablation by Dr. Lovena Le in 2006.   Wears glasses    prescripton reading glasses    Tobacco History: Social History   Tobacco Use  Smoking Status Never   Passive exposure: Past  Smokeless Tobacco Never   Counseling given: Not Answered   Outpatient Medications Prior to Visit  Medication Sig Dispense Refill   albuterol (VENTOLIN HFA) 108 (90 Base) MCG/ACT inhaler Inhale 1-2 puffs into the lungs every 6 (six) hours as needed for wheezing or shortness of breath. 18 g 0   Azelastine-Fluticasone 137-50 MCG/ACT SUSP Place 1 spray into the nose every 12 (twelve) hours. (Patient taking differently: Place 1 spray into the nose as needed.) 23 g 0   busPIRone (BUSPAR) 10 MG tablet Take 10 mg by mouth 2 (two) times daily.     cetirizine (ZYRTEC) 10 MG tablet Take 10 mg by mouth daily as needed for allergies.     diltiazem (CARDIZEM) 90 MG tablet Take 1 tablet (90 mg total) by mouth 2 (two) times daily. 60 tablet 6   ezetimibe (ZETIA) 10 MG tablet Take 1 tablet (10 mg total) by mouth daily. 90 tablet 3   FARXIGA 10 MG TABS tablet Take 10 mg by mouth daily.     furosemide (LASIX) 20 MG tablet Take  1 tablet (20 mg total) by mouth daily. 90 tablet 3   ibuprofen (ADVIL) 600 MG tablet Take 1 tablet (600 mg total) by mouth every 8 (eight) hours as needed. 30 tablet 0   indapamide (LOZOL) 2.5 MG tablet Take 2.5 mg by mouth daily as needed (fluid).     irbesartan (AVAPRO) 300 MG tablet Take 300 mg by mouth daily.     metFORMIN (GLUCOPHAGE) 500 MG tablet Take 1,000 mg by mouth 2 (two) times daily with a meal.     metoprolol tartrate (LOPRESSOR) 50 MG tablet Take 2 tablets (100 mg total) by mouth 2 (two) times daily. 180 tablet 3  penicillin v potassium (VEETID) 500 MG tablet Take 1 tablet (500 mg total) by mouth 3 (three) times daily for 10 days. 30 tablet 0   rosuvastatin (CRESTOR) 40 MG tablet Take 20 mg by mouth 2 (two) times daily.     Semaglutide, 2 MG/DOSE, (OZEMPIC, 2 MG/DOSE,) 8 MG/3ML SOPN Inject into the skin. Take on Friday.     sertraline (ZOLOFT) 100 MG tablet Take 150 mg by mouth daily.     traZODone (DESYREL) 50 MG tablet Take 50 mg by mouth at bedtime as needed for sleep.     nitroGLYCERIN (NITROSTAT) 0.4 MG SL tablet Place 1 tablet (0.4 mg total) under the tongue every 5 (five) minutes as needed for chest pain. Up to 3 (three) tablets 90 tablet 3   oxycodone (OXY-IR) 5 MG capsule Take 1 capsule (5 mg total) by mouth every 4 (four) hours as needed. 30 capsule 0   No facility-administered medications prior to visit.    Review of Systems  Review of Systems  Constitutional: Negative.   HENT: Negative.    Respiratory: Negative.    Cardiovascular: Negative.      Physical Exam  BP 126/84 (BP Location: Left Arm, Patient Position: Sitting, Cuff Size: Large)   Pulse 81   Temp 98.5 F (36.9 C) (Oral)   Ht '5\' 4"'$  (1.626 m)   Wt 267 lb 12.8 oz (121.5 kg)   SpO2 98%   BMI 45.97 kg/m  Physical Exam Constitutional:      General: She is not in acute distress.    Appearance: Normal appearance. She is obese. She is not ill-appearing.  HENT:     Head: Normocephalic and  atraumatic.  Cardiovascular:     Rate and Rhythm: Normal rate and regular rhythm.  Pulmonary:     Effort: Pulmonary effort is normal.     Breath sounds: Normal breath sounds.  Skin:    General: Skin is warm and dry.  Neurological:     General: No focal deficit present.     Mental Status: She is alert and oriented to person, place, and time. Mental status is at baseline.  Psychiatric:        Mood and Affect: Mood normal.        Behavior: Behavior normal.        Thought Content: Thought content normal.        Judgment: Judgment normal.      Lab Results:  CBC    Component Value Date/Time   WBC 8.1 08/15/2021 0941   RBC 4.91 08/15/2021 0941   HGB 14.8 08/15/2021 0941   HGB 13.9 10/19/2011 0919   HCT 43.8 08/15/2021 0941   HCT 41.0 10/19/2011 0919   PLT 247 08/15/2021 0941   PLT 236 10/19/2011 0919   MCV 89.2 08/15/2021 0941   MCV 88 10/19/2011 0919   MCH 30.1 08/15/2021 0941   MCHC 33.8 08/15/2021 0941   RDW 13.4 08/15/2021 0941   RDW 13.9 10/19/2011 0919   LYMPHSABS 2.7 06/01/2019 2032   LYMPHSABS 2.2 10/19/2011 0919   MONOABS 0.5 06/01/2019 2032   MONOABS 0.7 10/19/2011 0919   EOSABS 0.1 06/01/2019 2032   EOSABS 0.1 10/19/2011 0919   BASOSABS 0.0 06/01/2019 2032   BASOSABS 0.1 10/19/2011 0919    BMET    Component Value Date/Time   NA 138 10/05/2021 0847   NA 137 04/15/2019 0000   NA 140 10/19/2011 0919   K 4.2 10/05/2021 0847   K 4.1 10/19/2011 0919  CL 105 10/05/2021 0847   CL 104 10/19/2011 0919   CO2 26 10/05/2021 0847   CO2 24 10/19/2011 0919   GLUCOSE 120 (H) 10/05/2021 0847   GLUCOSE 104 (H) 10/19/2011 0919   BUN 17 10/05/2021 0847   BUN 16 04/15/2019 0000   BUN 16 10/19/2011 0919   CREATININE 0.76 10/05/2021 0847   CREATININE 0.63 10/19/2011 0919   CALCIUM 8.9 10/05/2021 0847   CALCIUM 8.5 10/19/2011 0919   GFRNONAA >60 10/05/2021 0847   GFRNONAA >60 10/19/2011 0919   GFRAA >60 06/01/2019 2032   GFRAA >60 10/19/2011 0919    BNP     Component Value Date/Time   BNP 53.0 10/05/2021 0848    ProBNP No results found for: "PROBNP"  Imaging: No results found.   Assessment & Plan:   Moderate obstructive sleep apnea - HST 09/24/20 showed moderate OSA, AHI 16/hr (weight 272lbs). Intolerant to CPAP d/t PTSD. We discussed other treatment options including weight loss, oral appliance or referral to ENT for possible surgical options.  She is not a candidate for inspire due to BMI.  Since severity of her sleep apnea is borderline mild to moderate would continue to work on weight loss efforts and make be able to consider an oral appliance.  She will discuss this with her dentist and get back to [place].  If neither are options and she remains symptomatic she may want to retry CPAP with a nasal mask versus a fullface mask.    Martyn Ehrich, NP 01/23/2022

## 2022-01-23 NOTE — Patient Instructions (Addendum)
Your sleep apnea from April 2022 showed mild-moderate obstructive sleep apnea, you had on average 16 apneic/hypopneic events an hour.  Mild OSA is when you have 5-15 apneic events an hour, moderate OSA is when you have 15-30 apneic events an hour.  Treatment could be weight loss and oral appliance since you are intolerant to CPAP.  At this time you are not a candidate for inspire due to BMI if you do achieve weight loss goal we could repeat sleep study to reevaluate degree of sleep apnea  Ask dentisit if they feel you could be candidate to be fitted for an oral appliance for sleep apnea   Recommendations - Focus on side sleeping position or elevate head of bed 30 degrees with wedge pillow - Do not drive experiencing excessive daytime sleepiness or fatigue - Continue to work on weight loss efforts (look into healthy weight and wellness with [facility] or weight watchers) - Notify our office if you would like referral to orthodontic dentist to be fitted for oral appliance for treatment of sleep apnea  Follow-up: - 6 months with [facility] NP or sooner if needed   Sleep Apnea Sleep apnea affects breathing during sleep. It causes breathing to stop for 10 seconds or more, or to become shallow. People with sleep apnea usually snore loudly. It can also increase the risk of: Heart attack. Stroke. Being very overweight (obese). Diabetes. Heart failure. Irregular heartbeat. High blood pressure. The goal of treatment is to help you breathe normally again. What are the causes?  The most common cause of this condition is a collapsed or blocked airway. There are three kinds of sleep apnea: Obstructive sleep apnea. This is caused by a blocked or collapsed airway. Central sleep apnea. This happens when the brain does not send the right signals to the muscles that control breathing. Mixed sleep apnea. This is a combination of obstructive and central sleep apnea. What increases the risk? Being  overweight. Smoking. Having a small airway. Being older. Being female. Drinking alcohol. Taking medicines to calm yourself (sedatives or tranquilizers). Having family members with the condition. Having a tongue or tonsils that are larger than normal. What are the signs or symptoms? Trouble staying asleep. Loud snoring. Headaches in the morning. Waking up gasping. Dry mouth or sore throat in the morning. Being sleepy or tired during the day. If you are sleepy or tired during the day, you may also: Not be able to focus your mind (concentrate). Forget things. Get angry a lot and have mood swings. Feel sad (depressed). Have changes in your personality. Have less interest in sex, if you are female. Be unable to have an erection, if you are female. How is this treated?  Sleeping on your side. Using a medicine to get rid of mucus in your nose (decongestant). Avoiding the use of alcohol, medicines to help you relax, or certain pain medicines (narcotics). Losing weight, if needed. Changing your diet. Quitting smoking. Using a machine to open your airway while you sleep, such as: An oral appliance. This is a mouthpiece that shifts your lower jaw forward. A CPAP device. This device blows air through a mask when you breathe out (exhale). An EPAP device. This has valves that you put in each nostril. A BIPAP device. This device blows air through a mask when you breathe in (inhale) and breathe out. Having surgery if other treatments do not work. Follow these instructions at home: Lifestyle Make changes that your doctor recommends. Eat a healthy diet. Lose  weight if needed. Avoid alcohol, medicines to help you relax, and some pain medicines. Do not smoke or use any products that contain nicotine or tobacco. If you need help quitting, ask your doctor. General instructions Take over-the-counter and prescription medicines only as told by your doctor. If you were given a machine to use while  you sleep, use it only as told by your doctor. If you are having surgery, make sure to tell your doctor you have sleep apnea. You may need to bring your device with you. Keep all follow-up visits. Contact a doctor if: The machine that you were given to use during sleep bothers you or does not seem to be working. You do not get better. You get worse. Get help right away if: Your chest hurts. You have trouble breathing in enough air. You have an uncomfortable feeling in your back, arms, or stomach. You have trouble talking. One side of your body feels weak. A part of your face is hanging down. These symptoms may be an emergency. Get help right away. Call your local emergency services (911 in the [place] Do not wait to see if the symptoms will go away. Do not drive yourself to the hospital. Summary This condition affects breathing during sleep. The most common cause is a collapsed or blocked airway. The goal of treatment is to help you breathe normally while you sleep. This information is not intended to replace advice given to you by your health care provider. Make sure you discuss any questions you have with your health care provider. Document Revised: 12/22/2020 Document Reviewed: 04/23/2020 Elsevier Patient Education  [place].

## 2022-01-23 NOTE — Assessment & Plan Note (Signed)
-   HST 09/24/20 showed moderate OSA, AHI 16/hr (weight 272lbs). Intolerant to CPAP d/t PTSD. We discussed other treatment options including weight loss, oral appliance or referral to ENT for possible surgical options.  She is not a candidate for inspire due to BMI.  Since severity of her sleep apnea is borderline mild to moderate would continue to work on weight loss efforts and make be able to consider an oral appliance.  She will discuss this with her dentist and get back to [place].  If neither are options and she remains symptomatic she may want to retry CPAP with a nasal mask versus a fullface mask.

## 2022-02-02 ENCOUNTER — Ambulatory Visit (INDEPENDENT_AMBULATORY_CARE_PROVIDER_SITE_OTHER): Payer: Medicare Other | Admitting: Podiatry

## 2022-02-02 DIAGNOSIS — E1149 Type 2 diabetes mellitus with other diabetic neurological complication: Secondary | ICD-10-CM | POA: Diagnosis not present

## 2022-02-02 DIAGNOSIS — B351 Tinea unguium: Secondary | ICD-10-CM | POA: Diagnosis not present

## 2022-02-02 DIAGNOSIS — L600 Ingrowing nail: Secondary | ICD-10-CM

## 2022-02-02 DIAGNOSIS — G629 Polyneuropathy, unspecified: Secondary | ICD-10-CM

## 2022-02-02 MED ORDER — CICLOPIROX 8 % EX SOLN: Freq: Every day | CUTANEOUS | 0 refills | Status: DC

## 2022-02-02 MED ORDER — CEPHALEXIN 500 MG PO CAPS: 500.0000 mg | ORAL_CAPSULE | Freq: Three times a day (TID) | ORAL | 0 refills | Status: DC

## 2022-02-02 MED ORDER — MUPIROCIN 2 % EX OINT: 1.0000 | TOPICAL_OINTMENT | Freq: Two times a day (BID) | CUTANEOUS | 2 refills | Status: AC

## 2022-02-02 NOTE — Progress Notes (Unsigned)
Burning/numbess Dr. Evelina Bucy her off of lyrica  ---  Subjective: Chief Complaint  Patient presents with   Diabetic Ulcer    Patient came in for a Diabetic foot care, pt has a right foot hallux ingrown and ulcer, patient tried to cut the toe nail and cut her skin, about a week ago, A1c- 8.4 BG- 186 (last night 33pm)   56 year old female presents the office today with above concerns.  She is suspicious of a burning, numbness to her feet.  Doctor took her off of Lyrica due to.  She is asking for referral to neurology as she is not able to go previously.  She had to cancel as her husband had lung cancer treatment.  She states she tried to trim the ingrown toenail of her right big toe and she cut the skin.  No drainage or pus that she reports but the area is tender.  She has concerns about nail fungus as well.  Her lowest blood sugar is around 186, A1c was at 10 at one time. She states it is down but still haivng high numbers.   Objective: AAO x3, NAD DP/PT pulses palpable bilaterally, CRT less than 3 seconds Sensation decreased with Semmes Weinstein monofilament.   Right hallux ingrown toenail she has cut the ingrown toenail but there is superficial granular wound where the nail lies back.  There is localized edema and faint erythema.  There is no drainage or pus.  No ascending cellulitis.  No fluctuation or crepitation.  There is no malodor. Nails are mildly dystrophic and discolored with yellow, brown discoloration. No pain with calf compression, swelling, warmth, erythema  Assessment: 56 year old female with, localized infection; uncontrolled diabetes with neuropathy; onychomycosis  Plan: -All treatment options discussed with the patient including all alternatives, risks, complications.  -Referral to neurology due to neuropathy -Recommended not to trim the nails herself.  Today I was able to trim them as a courtesy as they were not significantly elongated.  In particular I cleaned out the  border which remove the ingrown nails.  However given the localized edema edema will start antibiotics.  Prescribed cephalexin.  Recommend antibiotic ointment dressing changes daily. -[place].  -Patient encouraged to call the office with any questions, concerns, change in symptoms.

## 2022-02-15 ENCOUNTER — Telehealth: Payer: Medicare Other | Admitting: Physician Assistant

## 2022-02-15 DIAGNOSIS — R3989 Other symptoms and signs involving the genitourinary system: Secondary | ICD-10-CM

## 2022-02-16 MED ORDER — NITROFURANTOIN MONOHYD MACRO 100 MG PO CAPS: 100.0000 mg | ORAL_CAPSULE | Freq: Two times a day (BID) | ORAL | 0 refills | Status: DC

## 2022-02-16 NOTE — Progress Notes (Signed)
I have spent 5 minutes in review of e-visit questionnaire, review and updating patient chart, medical decision making and response to patient.   Syanne Looney Cody Chontel Warning, PA-C    

## 2022-02-16 NOTE — Progress Notes (Signed)

## 2022-02-27 ENCOUNTER — Ambulatory Visit: Payer: Medicare Other | Admitting: Podiatry

## 2022-02-27 ENCOUNTER — Encounter: Payer: Self-pay | Admitting: Podiatry

## 2022-03-07 ENCOUNTER — Encounter: Payer: Self-pay | Admitting: Neurology

## 2022-03-07 ENCOUNTER — Telehealth: Payer: Self-pay | Admitting: Neurology

## 2022-03-07 ENCOUNTER — Ambulatory Visit (INDEPENDENT_AMBULATORY_CARE_PROVIDER_SITE_OTHER): Payer: Medicare Other | Admitting: Neurology

## 2022-03-07 VITALS — BP 146/90 | HR 78 | Ht 64.0 in | Wt 274.4 lb

## 2022-03-07 DIAGNOSIS — R51 Headache with orthostatic component, not elsewhere classified: Secondary | ICD-10-CM

## 2022-03-07 DIAGNOSIS — M5481 Occipital neuralgia: Secondary | ICD-10-CM | POA: Diagnosis not present

## 2022-03-07 DIAGNOSIS — R519 Headache, unspecified: Secondary | ICD-10-CM

## 2022-03-07 DIAGNOSIS — M542 Cervicalgia: Secondary | ICD-10-CM | POA: Diagnosis not present

## 2022-03-07 DIAGNOSIS — H539 Unspecified visual disturbance: Secondary | ICD-10-CM

## 2022-03-07 MED ORDER — GABAPENTIN 300 MG PO CAPS: 300.0000 mg | ORAL_CAPSULE | Freq: Three times a day (TID) | ORAL | 11 refills | Status: AC

## 2022-03-07 NOTE — Telephone Encounter (Signed)
[employer] medicare/[place] medicaid NPR sent to [facility] [phone] 

## 2022-03-07 NOTE — Patient Instructions (Addendum)
tightness in the neck, shooting pains from the back of the neck to the head, can feel tightness on palpitation of neck muscles, radiates up the back of the head, brief and sharp left > right. Not migrainous, not pulsating/pounding, no light or sound sensitivity or like prior migraines. Sounds like occipital nerve irritation/cervicalgia but given concerning other symptoms needs a thorough evaluation  - MRI of the brain w/wo contrast with thin cuts through IAC (BUN 17 Creat 0.76 09/2021); [facility] Na - PT: [facility] Na: This is likely related to the neck and occipital neuralgia, would recommend medication (muscle relaxers or Gabapentin) and physical therapy (massage, stretching, exercises, dry needling of the cervical muscles) - If does not help and pain continues we can consider an MRI of the cervical spine and injections into the neck where the nerve emerge  - Also neck and occipital nerve blocks can be considered - For the buzzing, hearing test would be good or ENT - talk to primary care if doesn't resolve - neuropathy: likely diabetic, gabapentin may help but at next appointment can check some other neuropathy labs such as B12, B1, mma, b6, IFE/Spep  Meds ordered this encounter  Medications   gabapentin (NEURONTIN) 300 MG capsule    Sig: Take 1 capsule (300 mg total) by mouth 3 (three) times daily. Start at bedtime and slowly increase to 3x a day as needed to make sure no side effects like sedation.    Dispense:  90 capsule    Refill:  11     Occipital Neuralgia    Occipital neuralgia is a type of headache that causes brief episodes of very bad pain in the back of the head. Pain from occipital neuralgia may spread (radiate) to other parts of the head. These headaches may be caused by irritation of the nerves that leave the spinal cord high up in the neck, just below the base of the skull (occipital nerves). The occipital nerves transmit sensations from the back of the head, the top of the head,  and the areas behind the ears. What are the causes? This condition can occur without any known cause (primary headache syndrome). In other cases, this condition is caused by pressure on or irritation of one of the two occipital nerves. Pressure and irritation may be due to: Muscle spasm in the neck. Neck injury. Wear and tear of the vertebrae in the neck (osteoarthritis). Disease of the disks that separate the vertebrae. Swollen blood vessels that put pressure on the occipital nerves. Infections. Tumors. Diabetes. What are the signs or symptoms? This condition causes brief burning, stabbing, electric, shocking, or shooting pain in the back of the head that can radiate to the top of the head. It can happen on one side or both sides of the head. It can also cause: Pain behind the eye. Pain triggered by neck movement or hair brushing. Scalp tenderness. Aching in the back of the head between episodes of very bad pain. Pain that gets worse with exposure to bright lights. How is this diagnosed? Your health care provider may diagnose the condition based on a physical exam and your symptoms. Tests may be done, such as: Imaging studies of the brain and neck (cervical spine), such as an MRI or CT scan. These look for causes of pinched nerves. Applying pressure to the nerves in the neck to try to re-create the pain. Injection of numbing medicine into the occipital nerve areas to see if pain goes away (diagnostic nerve block). How is  this treated? Treatment for this condition may begin with simple measures, such as: Rest. Massage. Applying heat or cold to the area. Over-the-counter pain relievers. If these measures do not work, you may need other treatments, including: Medicines, such as: Prescription-strength anti-inflammatory medicines. Muscle relaxants. Anti-seizure medicines, which can relieve pain. Antidepressants, which can relieve pain. Injected medicines, such as medicines that numb  the area (local anesthetic) and steroids. Pulsed radiofrequency ablation. This is when wires are implanted to deliver electrical impulses that block pain signals from the occipital nerve. Surgery to relieve nerve pressure. Physical therapy. Follow these instructions at home: Managing pain     Avoid any activities that cause pain. Rest when you have an attack of pain. Try gentle massage to relieve pain. Try a different pillow or sleeping position. If directed, apply heat to the affected area as often as told by your health care provider. Use the heat source that your health care provider recommends, such as a moist heat pack or a heating pad. Place a towel between your skin and the heat source. Leave the heat on for 20-30 minutes. Remove the heat if your skin turns bright red. This is especially important if you are unable to feel pain, heat, or cold. You have a greater risk of getting burned. If directed, put ice on the back of your head and neck area. To do this: Put ice in a plastic bag. Place a towel between your skin and the bag. Leave the ice on for 20 minutes, 2-3 times a day. Remove the ice if your skin turns bright red. This is very important. If you cannot feel pain, heat, or cold, you have a greater risk of damage to the area. General instructions Take over-the-counter and prescription medicines only as told by your health care provider. Avoid things that make your symptoms worse, such as bright lights. Try to stay active. Get regular exercise that does not cause pain. Ask your health care provider to suggest safe exercises for you. Work with a physical therapist to learn stretching exercises you can do at home. Practice good posture. Keep all follow-up visits. This is important. Contact a health care provider if: Your medicine is not working. You have new or worsening symptoms. Get help right away if: You have very bad head pain that does not go away. You have a sudden  change in vision, balance, or speech. These symptoms may represent a serious problem that is an emergency. Do not wait to see if the symptoms will go away. Get medical help right away. Call your local emergency services (911 in the [place] Do not drive yourself to the hospital. Summary Occipital neuralgia is a type of headache that causes brief episodes of very bad pain in the back of the head. Pain from occipital neuralgia may spread (radiate) to other parts of the head. Treatment for this condition includes rest, massage, and medicines. This information is not intended to replace advice given to you by your health care provider. Make sure you discuss any questions you have with your health care provider. Document Revised: 03/14/2020 Document Reviewed: 03/14/2020 Elsevier Patient Education  [place].

## 2022-03-07 NOTE — Progress Notes (Signed)
[facility]   Provider:  Dr Jaynee Eagles Requesting Provider: Lanelle Bal, PA-C Primary Care Provider:  Lanelle Bal, PA-C  CC:  occipital headaches  HPI:  Christina Cameron is a 56 y.o. female here as requested by Lanelle Bal, PA-C for headaches. She has a hx of migraines severe in the past. She is having lightning bolt pains in the back of the head on the left and buzzing in the left side of the head. Also buzzing in the right and in the left ear. Hearing changes. She saw urgent care and her regular doctor and she is having buzzing and hearing changes. Her regular doctor said she didn;t see anything. Still having migraine and sharp headaches in the back of the head left > right and some dizziness. She get shooting pain from the back of the neck to the back of the head and to the left side of the head. Shooting electric pain. Happens several times a week and brief. She has neck stiffness and a knot in her neck and stiff neck with tension. She has tightness in the neck, shooting pains from the back of the neck to the head, can feel tightness on palpitation, radiates up the back of the head, brief and sharp left > right. Not migrainous, not pulsating/pounding, no light or sound sensitivity or like prior migraines. Also balance problems and dizziness, hearing and vision changes, headaches ban be positional, can happen in the morning when waking or nocturnally. No other focal neurologic deficits, associated symptoms, inciting events or modifiable factors.   Reviewed notes, labs and imaging from outside physicians, which showed:  CT head 2/9/202: Brain: No evidence of acute infarction, hemorrhage, hydrocephalus, extra-axial collection or mass lesion/mass effect.   Vascular: No hyperdense vessel or unexpected calcification.   Skull: Normal. Negative for fracture or focal lesion.   Sinuses/Orbits: No acute finding. Clear mastoid and middle ear spaces.   IMPRESSION: Negative head  CT.   Review of Systems: Patient complains of symptoms per HPI as well as the following symptoms occipital pain. Pertinent negatives and positives per HPI. All others negative.   Social History   Socioeconomic History   Marital status: Married    Spouse name: Not on file   Number of children: Not on file   Years of education: Not on file   Highest education level: Not on file  Occupational History   Not on file  Tobacco Use   Smoking status: Never    Passive exposure: Past   Smokeless tobacco: Never  Vaping Use   Vaping Use: Never used  Substance and Sexual Activity   Alcohol use: Yes    Comment: seldom   Drug use: No   Sexual activity: Yes    Birth control/protection: None  Other Topics Concern   Not on file  Social History Narrative   Caffiene  occasional pepsi 1-2 days.    Education: some college   Work disability: PTSD. Medical.   Social Determinants of Health   Financial Resource Strain: Not on file  Food Insecurity: Not on file  Transportation Needs: Not on file  Physical Activity: Not on file  Stress: Not on file  Social Connections: Not on file  Intimate Partner Violence: Not on file    Family History  Problem Relation Age of Onset   Cancer Mother    Cancer Father    Parkinson's disease Father    Stroke Brother    Hypertension Brother    Cancer - Colon Brother  Past Medical History:  Diagnosis Date   Arm vein blood clot, unspecified laterality    around 20 years ago as of 08/10/21, Patient doesn't remember what caused it. She was not put on blood thinners.   Arthritis    right knee   Asthma    Cancer ([place])    cervical 1989   Chest pain    a. normal cors by cath in 11/2014 / 03/02/2020 nuclear stress test demonstrated no perfusion defects consistent with prior infart or current ischemia. Normal study.   Chronic diastolic (congestive) heart failure (HCC)    09/27/20 Echocardiogram in Epic showed LVEF 60 to 65%.   Complication of anesthesia     post-operative nausea & vomiting   COVID-19    05/29/19 & 05/2020 Covid infections   Depression    Diabetes mellitus, type 2 ([place])    Dysrhythmia    heart palpitations   H/O cardiovascular stress test 03/02/2020   03/02/2020 Nuclear stress test in Epic showed no perusion defects consistent with prior infarct or ischemia - normal study.   Headache    migraines   High cholesterol    pt takes Crestor   Hypertension    Last cardiology office visit, 10/22/20 with [name], NP ( see note in [place]) as of 08/09/21.   Morbid (severe) obesity due to excess calories (HCC)    Neuromuscular disorder (HCC)    severe neuropathy in feet   Osteoarthritis of knee, unspecified    Palpitations    PONV (postoperative nausea and vomiting)    Post-COVID syndrome 05/2019   Pt experienced chest tightness, sob, palpitations & dizziness after Covid infection. See 04/2020 note from [name], NP in [place].   PTSD (post-traumatic stress disorder)    Raynaud's syndrome    Sleep apnea 09/24/2020   sleep study indicated moderate sleep apea   SVT (supraventricular tachycardia)    a. s/p ablation by Dr. Lovena Le in 2006.   Wears glasses    prescripton reading glasses    Patient Active Problem List   Diagnosis Date Noted   Moderate obstructive sleep apnea 01/23/2022   Heart failure with preserved ejection fraction ([place]) 09/19/2021   Abnormal vaginal bleeding in postmenopausal patient 08/17/2021   Paroxysmal SVT (supraventricular tachycardia) 09/17/2019   Upper airway cough syndrome vs atypical asthma/ cough variant  09/17/2019   Anxiety 09/15/2019   Essential hypertension, benign 05/07/2019   Chronic diastolic (congestive) heart failure (HCC)    Hyperlipidemia LDL goal <70    Morbid (severe) obesity due to excess calories (HCC)    Osteoarthritis of knee, unspecified    Palpitations    Somnolence    Type 2 diabetes mellitus with hyperglycemia (HCC)    Raynaud's syndrome    DOE (dyspnea on exertion)     Precordial chest pain 09/08/2014    Past Surgical History:  Procedure Laterality Date   CARDIAC CATHETERIZATION N/A 12/14/2014   Procedure: Right/Left Heart Cath and Coronary Angiography;  Surgeon: Sherren Mocha, MD;  Location: [facility];  Service: Cardiovascular;  Laterality: N/A;   CARDIAC ELECTROPHYSIOLOGY STUDY AND ABLATION     around 2006, Patient states that heart rate was up to 180 bpm.   CESAREAN SECTION     x2 1990, 2005   CYSTOSCOPY  08/17/2021   Procedure: CYSTOSCOPY;  Surgeon: Bobbye Charleston, MD;  Location: [place] [place];  Service: Gynecology;;   ENDOMETRIAL ABLATION     HYSTEROSCOPY WITH D & C N/A 03/11/2021   Procedure: DILATATION AND  CURETTAGE /HYSTEROSCOPY;  Surgeon: Jerelyn Charles, MD;  Location: [place];  Service: Gynecology;  Laterality: N/A;   OPERATIVE ULTRASOUND N/A 03/11/2021   Procedure: OPERATIVE ULTRASOUND;  Surgeon: Jerelyn Charles, MD;  Location: [facility];  Service: Gynecology;  Laterality: N/A;  ultrasound guidance needed   ROBOTIC ASSISTED LAPAROSCOPIC HYSTERECTOMY AND SALPINGECTOMY Bilateral 08/17/2021   Procedure: XI ROBOTIC ASSISTED LAPAROSCOPIC HYSTERECTOMY AND  BILATERAL SALPINGECTOMY AND OOPHERETOMY;  Surgeon: Bobbye Charleston, MD;  Location: [facility];  Service: Gynecology;  Laterality: Bilateral;    Current Outpatient Medications  Medication Sig Dispense Refill   albuterol (VENTOLIN HFA) 108 (90 Base) MCG/ACT inhaler Inhale 1-2 puffs into the lungs every 6 (six) hours as needed for wheezing or shortness of breath. 18 g 0   Azelastine-Fluticasone 137-50 MCG/ACT SUSP Place 1 spray into the nose every 12 (twelve) hours. (Patient taking differently: Place 1 spray into the nose as needed.) 23 g 0   busPIRone (BUSPAR) 10 MG tablet Take 10 mg by mouth 2 (two) times daily.     ciclopirox (PENLAC) 8 % solution Apply topically at bedtime. Apply over nail and surrounding skin. Apply daily over previous coat. After seven (7)  days, may remove with alcohol and continue cycle. 6.6 mL 0   diltiazem (CARDIZEM) 90 MG tablet Take 1 tablet (90 mg total) by mouth 2 (two) times daily. 60 tablet 6   ezetimibe (ZETIA) 10 MG tablet Take 1 tablet (10 mg total) by mouth daily. 90 tablet 3   FARXIGA 10 MG TABS tablet Take 10 mg by mouth daily.     furosemide (LASIX) 20 MG tablet Take 1 tablet (20 mg total) by mouth daily. 90 tablet 3   gabapentin (NEURONTIN) 300 MG capsule Take 1 capsule (300 mg total) by mouth 3 (three) times daily. Start at bedtime and slowly increase to 3x a day as needed to make sure no side effects like sedation. 90 capsule 11   ibuprofen (ADVIL) 600 MG tablet Take 1 tablet (600 mg total) by mouth every 8 (eight) hours as needed. 30 tablet 0   indapamide (LOZOL) 2.5 MG tablet Take 2.5 mg by mouth daily as needed (fluid).     irbesartan (AVAPRO) 300 MG tablet Take 300 mg by mouth daily.     metFORMIN (GLUCOPHAGE) 500 MG tablet Take 1,000 mg by mouth 2 (two) times daily with a meal.     metoprolol tartrate (LOPRESSOR) 50 MG tablet Take 2 tablets (100 mg total) by mouth 2 (two) times daily. 180 tablet 3   mupirocin ointment (BACTROBAN) 2 % Apply 1 Application topically 2 (two) times daily. 30 g 2   nitrofurantoin, macrocrystal-monohydrate, (MACROBID) 100 MG capsule Take 1 capsule (100 mg total) by mouth 2 (two) times daily. 10 capsule 0   Semaglutide, 2 MG/DOSE, (OZEMPIC, 2 MG/DOSE,) 8 MG/3ML SOPN Inject into the skin. Take on Friday.     sertraline (ZOLOFT) 100 MG tablet Take 150 mg by mouth daily.     traZODone (DESYREL) 50 MG tablet Take 50 mg by mouth at bedtime as needed for sleep.     nitroGLYCERIN (NITROSTAT) 0.4 MG SL tablet Place 1 tablet (0.4 mg total) under the tongue every 5 (five) minutes as needed for chest pain. Up to 3 (three) tablets 90 tablet 3   No current facility-administered medications for this visit.    Allergies as of 03/07/2022   (No Known Allergies)    Vitals: BP (!) 146/90    Pulse 78   Ht 5'  4" (1.626 m)   Wt 274 lb 6.4 oz (124.5 kg)   BMI 47.10 kg/m  Last Weight:  Wt Readings from Last 1 Encounters:  03/07/22 274 lb 6.4 oz (124.5 kg)   Last Height:   Ht Readings from Last 1 Encounters:  03/07/22 '5\' 4"'$  (1.626 m)     Physical exam: Exam: Gen: NAD, conversant, well nourised, obese, well groomed                     CV: RRR, no MRG. No Carotid Bruits. No peripheral edema, warm, nontender Eyes: Conjunctivae clear without exudates or hemorrhage Neck: Tightness of the cervical muscles, paplpating the muscle helps  Neuro: Detailed Neurologic Exam  Speech:    Speech is normal; fluent and spontaneous with normal comprehension.  Cognition:    The patient is oriented to person, place, and time;     recent and remote memory intact;     language fluent;     normal attention, concentration,     fund of knowledge Cranial Nerves:    The pupils are equal, round, and reactive to light. The fundi are normal and spontaneous venous pulsations are present. Visual fields are full to finger confrontation. Extraocular movements are intact. Trigeminal sensation is intact and the muscles of mastication are normal. The face is symmetric. The palate elevates in the midline. Hearing intact. Voice is normal. Shoulder shrug is normal. The tongue has normal motion without fasciculations.   Coordination:    Normal   Gait:     normal.   Motor Observation:    No asymmetry, no atrophy, and no involuntary movements noted. Tone:    Normal muscle tone.    Posture:    Posture is normal. normal erect    Strength:    Strength is V/V in the upper and lower limbs.      Sensation: intact to LT     Reflex Exam:  DTR's: absent AJs.  Deep tendon reflexes in the upper and lower extremities are symmetrical  bilaterally.   Toes:    The toes are downgoing bilaterally.   Clonus:    Clonus is absent.    Assessment/Plan:  She has tightness in the neck, shooting pains from the  back of the neck to the back of the head, can feel tightness on palpitation of neck muscles, radiates up the back of the head, brief and sharp left > right. Not migrainous, not pulsating/pounding, no light or sound sensitivity or like prior migraines. Sounds like occipital nerve irritation/cervicalgia but given concerning other symptoms needs a thorough evaluation  - MRI of the brain w/wo contrast with thin cuts through IAC (BUN 17 Creat 0.76 09/2021); Forestine Na - PT: Forestine Na: This is likely related to the neck and occipital neuralgia/cervicalgia, would recommend medication (muscle relaxers or Gabapentin) and physical therapy (massage, stretching, exercises, dry needling of the cervical muscles) - If does not help and pain continues we can consider an MRI of the cervical spine and injections into the neck where the nerve emerge - ESI or medial branch blocks - Also neck and occipital nerve blocks can be considered - For the ear buzzing, hearing test would be good or ENT - talk to primary care if doesn't resolve - neuropathy: likely diabetic, gabapentin may help but at next appointment can check some other neuropathy labs such as B12, B1, mma, b6, IFE/Spep   Orders Placed This Encounter  Procedures   MR BRAIN W WO CONTRAST  Ambulatory referral to Physical Therapy   Meds ordered this encounter  Medications   gabapentin (NEURONTIN) 300 MG capsule    Sig: Take 1 capsule (300 mg total) by mouth 3 (three) times daily. Start at bedtime and slowly increase to 3x a day as needed to make sure no side effects like sedation.    Dispense:  90 capsule    Refill:  11    Cc: Lanelle Bal, PA-C,  Lanelle Bal, PA-C  Sarina Ill, MD  [facility] [address] [place], [place] [address]  Phone [phone] Fax [phone]

## 2022-03-27 ENCOUNTER — Ambulatory Visit (INDEPENDENT_AMBULATORY_CARE_PROVIDER_SITE_OTHER): Payer: Medicare Other | Admitting: Podiatry

## 2022-03-27 DIAGNOSIS — E1149 Type 2 diabetes mellitus with other diabetic neurological complication: Secondary | ICD-10-CM | POA: Diagnosis not present

## 2022-03-27 DIAGNOSIS — M79674 Pain in right toe(s): Secondary | ICD-10-CM

## 2022-03-27 DIAGNOSIS — M79675 Pain in left toe(s): Secondary | ICD-10-CM

## 2022-03-27 DIAGNOSIS — B351 Tinea unguium: Secondary | ICD-10-CM

## 2022-03-27 DIAGNOSIS — L84 Corns and callosities: Secondary | ICD-10-CM | POA: Diagnosis not present

## 2022-03-27 NOTE — Patient Instructions (Signed)

## 2022-03-27 NOTE — Progress Notes (Unsigned)
Subjective: Chief Complaint  Patient presents with   Diabetic Ulcer    Right foot ulcer, doing better, patient denies any pain,  Diabetic, A1c-8.7 BG- 165 (yesterday)   56 year old female presents the above concerns.  Ingrown on the right big toe but not causing any pain and no signs of infection she reports.  She has a callus with her big toes but no open lesions she reports.  The nails in general are thick and discolored.  They do cause discomfort thickened elongated.  No swelling redness or drainage to the toenail sites.  Objective: AAO x3, NAD DP/PT pulses palpable bilaterally, CRT less than 3 seconds Nails are hypertrophic, dystrophic, brittle, discolored, elongated 10.  Incurvation of the hallux toenail without any signs of infection.  No surrounding redness or drainage. Tenderness nails 1-5 bilaterally. No open lesions or pre-ulcerative lesions are identified today. Hyperkeratotic lesions medial hallux bilaterally with small amount of dried blood under the callus.  Upon debridement there is no underlying ulceration drainage or signs of infection.  No open lesions. No pain with calf compression, swelling, warmth, erythema  Assessment: 56 year old female with symptomatic onychosis, right hallux ingrown toenail, preulcerative calluses  Plan: -All treatment options discussed with the patient including all alternatives, risks, complications.  -Sharp debrided nails x 10 without any complications or bleeding.  He is to monitor the ingrown toenail for any signs or symptoms of infection but currently not able to appreciate any. -Sharp debridement calluses x2 with any complications or bleeding.  Moisturizer, offloading daily. -Daily foot inspection. -Patient encouraged to call the office with any questions, concerns, change in symptoms.   Trula Slade DPM

## 2022-05-01 ENCOUNTER — Encounter: Payer: Self-pay | Admitting: Internal Medicine

## 2022-05-01 ENCOUNTER — Ambulatory Visit: Payer: Medicare Other | Attending: Internal Medicine | Admitting: Internal Medicine

## 2022-05-01 VITALS — BP 150/78 | HR 77 | Ht 64.0 in | Wt 276.0 lb

## 2022-05-01 DIAGNOSIS — I10 Essential (primary) hypertension: Secondary | ICD-10-CM | POA: Diagnosis not present

## 2022-05-01 DIAGNOSIS — R079 Chest pain, unspecified: Secondary | ICD-10-CM | POA: Diagnosis not present

## 2022-05-01 MED ORDER — ROSUVASTATIN CALCIUM 40 MG PO TABS: 40.0000 mg | ORAL_TABLET | Freq: Every day | ORAL | 3 refills | Status: AC

## 2022-05-01 MED ORDER — AMLODIPINE BESYLATE 5 MG PO TABS: 5.0000 mg | ORAL_TABLET | Freq: Every day | ORAL | 3 refills | Status: DC

## 2022-05-01 NOTE — Progress Notes (Signed)
Cardiology Office Note  Date: 05/01/2022   ID: [name], [name] 03/20/1966, MRN [id number]  PCP:  Lanelle Bal, PA-C  Cardiologist:  Chalmers Guest, MD Electrophysiologist:  None   Reason for Office Visit: Chest pain follow-up  History of Present Illness: Christina Cameron is a 57 y.o. female known to have HTN, DM 2, HFpEF, HLD, PSVT presents to cardiology clinic for follow-up visit.  Patient has been complaining of substernal chest tightness with exertion, 2-3 times per week, lasting for 10 to 20 minutes which has been worsening recently. She underwent LHC in the past with angiographically normal coronaries. She also underwent a CT cardiac in 2023 which showed calcium score of 0 and small PFO with very small left-to-right shunt. She is compliant with all her medications and has no side effects.  She continues to have SOB which is at baseline, denied any palpitations, dizziness/lightheadedness, syncope and LE swelling.  For chest tightness in the past, she was placed on metoprolol which improved her chest tightness.  Ranexa also helped.  She never tried Imdur.  Past Medical History:  Diagnosis Date   Arm vein blood clot, unspecified laterality    around 20 years ago as of 08/10/21, Patient doesn't remember what caused it. She was not put on blood thinners.   Arthritis    right knee   Asthma    Cancer ([place])    cervical 1989   Chest pain    a. normal cors by cath in 11/2014 / 03/02/2020 nuclear stress test demonstrated no perfusion defects consistent with prior infart or current ischemia. Normal study.   Chronic diastolic (congestive) heart failure (HCC)    09/27/20 Echocardiogram in Epic showed LVEF 60 to 65%.   Complication of anesthesia    post-operative nausea & vomiting   COVID-19    05/29/19 & 05/2020 Covid infections   Depression    Diabetes mellitus, type 2 ([place])    Dysrhythmia    heart palpitations   H/O cardiovascular stress test 03/02/2020   03/02/2020 Nuclear stress  test in Epic showed no perusion defects consistent with prior infarct or ischemia - normal study.   Headache    migraines   High cholesterol    pt takes Crestor   Hypertension    Last cardiology office visit, 10/22/20 with [name], NP ( see note in [place]) as of 08/09/21.   Morbid (severe) obesity due to excess calories (HCC)    Neuromuscular disorder (HCC)    severe neuropathy in feet   Osteoarthritis of knee, unspecified    Palpitations    PONV (postoperative nausea and vomiting)    Post-COVID syndrome 05/2019   Pt experienced chest tightness, sob, palpitations & dizziness after Covid infection. See 04/2020 note from [name], NP in [place].   PTSD (post-traumatic stress disorder)    Raynaud's syndrome    Sleep apnea 09/24/2020   sleep study indicated moderate sleep apea   SVT (supraventricular tachycardia)    a. s/p ablation by Dr. Lovena Le in 2006.   Wears glasses    prescripton reading glasses    Past Surgical History:  Procedure Laterality Date   CARDIAC CATHETERIZATION N/A 12/14/2014   Procedure: Right/Left Heart Cath and Coronary Angiography;  Surgeon: Sherren Mocha, MD;  Location: [facility];  Service: Cardiovascular;  Laterality: N/A;   CARDIAC ELECTROPHYSIOLOGY STUDY AND ABLATION     around 2006, Patient states that heart rate was up to 180 bpm.   CESAREAN SECTION  x2 1990, 2005   CYSTOSCOPY  08/17/2021   Procedure: CYSTOSCOPY;  Surgeon: Bobbye Charleston, MD;  Location: [facility];  Service: Gynecology;;   ENDOMETRIAL ABLATION     HYSTEROSCOPY WITH D & C N/A 03/11/2021   Procedure: DILATATION AND CURETTAGE /HYSTEROSCOPY;  Surgeon: Jerelyn Charles, MD;  Location: [facility];  Service: Gynecology;  Laterality: N/A;   OPERATIVE ULTRASOUND N/A 03/11/2021   Procedure: OPERATIVE ULTRASOUND;  Surgeon: Jerelyn Charles, MD;  Location: [facility];  Service: Gynecology;  Laterality: N/A;  ultrasound guidance needed   ROBOTIC ASSISTED LAPAROSCOPIC HYSTERECTOMY AND  SALPINGECTOMY Bilateral 08/17/2021   Procedure: XI ROBOTIC ASSISTED LAPAROSCOPIC HYSTERECTOMY AND  BILATERAL SALPINGECTOMY AND OOPHERETOMY;  Surgeon: Bobbye Charleston, MD;  Location: [facility];  Service: Gynecology;  Laterality: Bilateral;    Current Outpatient Medications  Medication Sig Dispense Refill   albuterol (VENTOLIN HFA) 108 (90 Base) MCG/ACT inhaler Inhale 1-2 puffs into the lungs every 6 (six) hours as needed for wheezing or shortness of breath. 18 g 0   amLODipine (NORVASC) 5 MG tablet Take 1 tablet (5 mg total) by mouth daily. 90 tablet 3   Azelastine-Fluticasone 137-50 MCG/ACT SUSP Place 1 spray into the nose every 12 (twelve) hours. (Patient taking differently: Place 1 spray into the nose as needed.) 23 g 0   busPIRone (BUSPAR) 10 MG tablet Take 10 mg by mouth 2 (two) times daily.     ciclopirox (PENLAC) 8 % solution Apply topically at bedtime. Apply over nail and surrounding skin. Apply daily over previous coat. After seven (7) days, may remove with alcohol and continue cycle. 6.6 mL 0   ezetimibe (ZETIA) 10 MG tablet Take 1 tablet (10 mg total) by mouth daily. 90 tablet 3   FARXIGA 10 MG TABS tablet Take 10 mg by mouth daily.     furosemide (LASIX) 20 MG tablet Take 1 tablet (20 mg total) by mouth daily. 90 tablet 3   gabapentin (NEURONTIN) 300 MG capsule Take 1 capsule (300 mg total) by mouth 3 (three) times daily. Start at bedtime and slowly increase to 3x a day as needed to make sure no side effects like sedation. 90 capsule 11   ibuprofen (ADVIL) 600 MG tablet Take 1 tablet (600 mg total) by mouth every 8 (eight) hours as needed. 30 tablet 0   indapamide (LOZOL) 2.5 MG tablet Take 2.5 mg by mouth daily as needed (fluid).     irbesartan (AVAPRO) 300 MG tablet Take 300 mg by mouth daily.     metFORMIN (GLUCOPHAGE) 500 MG tablet Take 1,000 mg by mouth 2 (two) times daily with a meal.     metoprolol tartrate (LOPRESSOR) 50 MG tablet Take 2 tablets (100 mg total)  by mouth 2 (two) times daily. 180 tablet 3   mupirocin ointment (BACTROBAN) 2 % Apply 1 Application topically 2 (two) times daily. 30 g 2   nitrofurantoin, macrocrystal-monohydrate, (MACROBID) 100 MG capsule Take 1 capsule (100 mg total) by mouth 2 (two) times daily. 10 capsule 0   nitroGLYCERIN (NITROSTAT) 0.4 MG SL tablet Place 1 tablet (0.4 mg total) under the tongue every 5 (five) minutes as needed for chest pain. Up to 3 (three) tablets 90 tablet 3   rosuvastatin (CRESTOR) 40 MG tablet Take 1 tablet (40 mg total) by mouth daily. 90 tablet 3   Semaglutide, 2 MG/DOSE, (OZEMPIC, 2 MG/DOSE,) 8 MG/3ML SOPN Inject into the skin. Take on Friday.     sertraline (ZOLOFT) 100 MG tablet Take 150  mg by mouth daily.     traZODone (DESYREL) 50 MG tablet Take 50 mg by mouth at bedtime as needed for sleep.     No current facility-administered medications for this visit.   Allergies:  Patient has no known allergies.   Social History: The patient  reports that she has never smoked. She has been exposed to tobacco smoke. She has never used smokeless tobacco. She reports current alcohol use. She reports that she does not use drugs.   Family History: The patient's family history includes Cancer in her father and mother; Cancer - Colon in her brother; Hypertension in her brother; Parkinson's disease in her father; Stroke in her brother.   ROS:  Please see the history of present illness. Otherwise, complete review of systems is positive for none.  All other systems are reviewed and negative.   Physical Exam: VS:  BP (!) 150/78   Pulse 77   Ht '5\' 4"'$  (1.626 m)   Wt 276 lb (125.2 kg)   SpO2 96%   BMI 47.38 kg/m , BMI Body mass index is 47.38 kg/m.  Wt Readings from Last 3 Encounters:  05/01/22 276 lb (125.2 kg)  03/07/22 274 lb 6.4 oz (124.5 kg)  01/23/22 267 lb 12.8 oz (121.5 kg)    General: Patient appears comfortable at rest. HEENT: Conjunctiva and lids normal, oropharynx clear with moist  mucosa. Neck: Supple, no elevated JVP or carotid bruits, no thyromegaly. Lungs: Clear to auscultation, nonlabored breathing at rest. Cardiac: Regular rate and rhythm, no S3 or significant systolic murmur, no pericardial rub. Abdomen: Soft, nontender, no hepatomegaly, bowel sounds present, no guarding or rebound. Extremities: No pitting edema, distal pulses 2+. Skin: Warm and dry. Musculoskeletal: No kyphosis. Neuropsychiatric: Alert and oriented x3, affect grossly appropriate.  ECG:  NSR  Recent Labwork: 08/15/2021: Hemoglobin 14.8; Platelets 247 10/05/2021: B Natriuretic Peptide 53.0; BUN 17; Creatinine, Ser 0.76; Potassium 4.2; Sodium 138     Component Value Date/Time   CHOL 263 (H) 10/05/2021 0848   TRIG 167 (H) 10/05/2021 0848   HDL 48 10/05/2021 0848   CHOLHDL 5.5 10/05/2021 0848   VLDL 33 10/05/2021 0848   LDLCALC 182 (H) 10/05/2021 0848   LDLCALC 152 (H) 08/07/2019 6629    Other Studies Reviewed Today:   Assessment and Plan: Patient is a 56 year old F known to have HTN, DM 2, HLD, HFpEF, palpitations presented to the cardiology clinic for follow-up visit.  # Cardiac chest pain -Patient has characteristics of cardiac chest pain. LHC showed angiographically normal coronaries. CTA cardiac showed calcium score of 0 and small PFO.  I suspect if her chest pain is likely secondary to coronary microvascular dysfunction. Continue metoprolol to tartrate 100 mg twice a day and start amlodipine 5 mg once a day.  # Palpitations -Patient was placed on diltiazem 90 mg twice daily in the past for palpitations. Unclear source of palpitations. Stop diltiazem and reassess her symptoms with event monitor.  # HTN, poorly controlled -Switch diltiazem to amlodipine 5 mg once daily -Continue irbesartan 300 mg once daily -Continue metoprolol tartrate 100 mg twice daily  # HFpEF -Continue Lasix 20 mg once daily -Continue Farxiga 10 mg once daily  # HLD, not at goal -Although patient has  calcium score of 0, she has evidence of aortic atherosclerosis on CT cardiac. -Start rosuvastatin 40 mg nightly.  Goal LDL less than 100.  I have spent a total of 33 minutes with patient reviewing chart, EKGs, labs and examining patient as well  as establishing an assessment and plan that was discussed with the patient.  > 50% of time was spent in direct patient care.     Medication Adjustments/Labs and Tests Ordered: Current medicines are reviewed at length with the patient today.  Concerns regarding medicines are outlined above.   Tests Ordered: Orders Placed This Encounter  Procedures   EKG 12-Lead    Medication Changes: Meds ordered this encounter  Medications   amLODipine (NORVASC) 5 MG tablet    Sig: Take 1 tablet (5 mg total) by mouth daily.    Dispense:  90 tablet    Refill:  3   rosuvastatin (CRESTOR) 40 MG tablet    Sig: Take 1 tablet (40 mg total) by mouth daily.    Dispense:  90 tablet    Refill:  3    Disposition:  Follow up  6 months  Signed, Christina Cameron Fidel Levy, MD, 05/01/2022 4:30 PM [facility] [place] [address] [place], [place] [address]

## 2022-05-01 NOTE — Patient Instructions (Signed)
Medication Instructions:  Your physician has recommended you make the following change in your medication:  Stop Diltiamzem Start Amlodipine 5 mg tablets daily Start Crestor 40 mg tablets daily.    Labwork: None  Testing/Procedures: None  Follow-Up: Follow up with Dr. Dellia Cloud in 6 months.   Any Other Special Instructions Will Be Listed Below (If Applicable).     If you need a refill on your cardiac medications before your next appointment, please call your pharmacy.

## 2022-05-15 ENCOUNTER — Ambulatory Visit (HOSPITAL_COMMUNITY)
Admission: RE | Admit: 2022-05-15 | Discharge: 2022-05-15 | Disposition: A | Discharge status: Home / Self Care | Payer: Medicare Other | Source: Ambulatory Visit | Attending: Neurology | Admitting: Neurology

## 2022-05-15 DIAGNOSIS — H539 Unspecified visual disturbance: Secondary | ICD-10-CM | POA: Diagnosis present

## 2022-05-15 DIAGNOSIS — R51 Headache with orthostatic component, not elsewhere classified: Secondary | ICD-10-CM | POA: Diagnosis present

## 2022-05-15 DIAGNOSIS — R519 Headache, unspecified: Secondary | ICD-10-CM | POA: Diagnosis present

## 2022-05-15 MED ORDER — GADOBUTROL 1 MMOL/ML IV SOLN
10.0000 mL | Freq: Once | INTRAVENOUS | Status: AC | PRN
  Administered 2022-05-15: 10 mL via INTRAVENOUS

## 2022-05-30 ENCOUNTER — Ambulatory Visit: Payer: Medicare Other | Admitting: Podiatry

## 2022-06-16 ENCOUNTER — Ambulatory Visit: Payer: 59 | Admitting: Podiatry

## 2022-07-08 ENCOUNTER — Telehealth: Payer: Medicare Other | Admitting: Nurse Practitioner

## 2022-07-08 DIAGNOSIS — N760 Acute vaginitis: Secondary | ICD-10-CM | POA: Diagnosis not present

## 2022-07-08 DIAGNOSIS — B9689 Other specified bacterial agents as the cause of diseases classified elsewhere: Secondary | ICD-10-CM

## 2022-07-08 MED ORDER — METRONIDAZOLE 500 MG PO TABS: 500.0000 mg | ORAL_TABLET | Freq: Two times a day (BID) | ORAL | 0 refills | Status: DC

## 2022-07-08 NOTE — Progress Notes (Signed)
E-Visit for Vaginal Symptoms  We are sorry that you are not feeling well. Here is how we plan to help! Based on what you shared with me it looks like you: May have a vaginosis due to bacteria  Vaginosis is an inflammation of the vagina that can result in discharge, itching and pain. The cause is usually a change in the normal balance of vaginal bacteria or an infection. Vaginosis can also result from reduced estrogen levels after menopause.  The most common causes of vaginosis are:   Bacterial vaginosis which results from an overgrowth of one on several organisms that are normally present in your vagina.   Yeast infections which are caused by a naturally occurring fungus called candida.   Vaginal atrophy (atrophic vaginosis) which results from the thinning of the vagina from reduced estrogen levels after menopause.   Trichomoniasis which is caused by a parasite and is commonly transmitted by sexual intercourse.  Factors that increase your risk of developing vaginosis include: Medications, such as antibiotics and steroids Uncontrolled diabetes Use of hygiene products such as bubble bath, vaginal spray or vaginal deodorant Douching Wearing damp or tight-fitting clothing Using an intrauterine device (IUD) for birth control Hormonal changes, such as those associated with pregnancy, birth control pills or menopause Sexual activity Having a sexually transmitted infection  Your treatment plan is Metronidazole or Flagyl 500mg twice a day for 7 days.  I have electronically sent this prescription into the pharmacy that you have chosen.  Be sure to take all of the medication as directed. Stop taking any medication if you develop a rash, tongue swelling or shortness of breath. Mothers who are breast feeding should consider pumping and discarding their breast milk while on these antibiotics. However, there is no consensus that infant exposure at these doses would be harmful.  Remember that  medication creams can weaken latex condoms. .   HOME CARE:  Good hygiene may prevent some types of vaginosis from recurring and may relieve some symptoms:  Avoid baths, hot tubs and whirlpool spas. Rinse soap from your outer genital area after a shower, and dry the area well to prevent irritation. Don't use scented or harsh soaps, such as those with deodorant or antibacterial action. Avoid irritants. These include scented tampons and pads. Wipe from front to back after using the toilet. Doing so avoids spreading fecal bacteria to your vagina.  Other things that may help prevent vaginosis include:  Don't douche. Your vagina doesn't require cleansing other than normal bathing. Repetitive douching disrupts the normal organisms that reside in the vagina and can actually increase your risk of vaginal infection. Douching won't clear up a vaginal infection. Use a latex condom. Both female and female latex condoms may help you avoid infections spread by sexual contact. Wear cotton underwear. Also wear pantyhose with a cotton crotch. If you feel comfortable without it, skip wearing underwear to bed. Yeast thrives in moist environments Your symptoms should improve in the next day or two.  GET HELP RIGHT AWAY IF:  You have pain in your lower abdomen ( pelvic area or over your ovaries) You develop nausea or vomiting You develop a fever Your discharge changes or worsens You have persistent pain with intercourse You develop shortness of breath, a rapid pulse, or you faint.  These symptoms could be signs of problems or infections that need to be evaluated by a medical provider now.  MAKE SURE YOU   Understand these instructions. Will watch your condition. Will get help right   away if you are not doing well or get worse.  Thank you for choosing an e-visit.  Your e-visit answers were reviewed by a board certified advanced clinical practitioner to complete your personal care plan. Depending upon the  condition, your plan could have included both over the counter or prescription medications.  Please review your pharmacy choice. Make sure the pharmacy is open so you can pick up prescription now. If there is a problem, you may contact your provider through [employer] and have the prescription routed to another pharmacy.  Your safety is important to [place]. If you have drug allergies check your prescription carefully.   For the next 24 hours you can use MyChart to ask questions about today's visit, request a non-urgent call back, or ask for a work or school excuse. You will get an email in the next two days asking about your experience. I hope that your e-visit has been valuable and will speed your recovery.  Mary-Margaret Hassell Done, FNP   5-10 minutes spent reviewing and documenting in chart.

## 2022-07-11 ENCOUNTER — Ambulatory Visit: Payer: 59 | Admitting: Adult Health

## 2022-07-13 ENCOUNTER — Ambulatory Visit: Payer: 59 | Admitting: Podiatry

## 2022-07-26 ENCOUNTER — Ambulatory Visit: Payer: Medicare Other | Admitting: Primary Care

## 2022-07-31 ENCOUNTER — Ambulatory Visit: Payer: 59 | Admitting: Podiatry

## 2022-08-07 ENCOUNTER — Ambulatory Visit: Payer: 59 | Admitting: Podiatry

## 2022-08-07 ENCOUNTER — Ambulatory Visit: Payer: Medicare Other | Admitting: Primary Care

## 2022-08-07 NOTE — Telephone Encounter (Signed)
Called and spoke with patient. She stated that she has been dealing with an upset stomach and sore throat since yesterday and would not be able to make her appt today with Beth at 11am. I advised her that I would go ahead and cancel the appt. She wants to call us back once she is feeling better and has access to her scheduling book.   Appt has been cancelled. Nothing further needed at time of call.

## 2022-08-09 ENCOUNTER — Telehealth: Payer: 59 | Admitting: Physician Assistant

## 2022-08-09 DIAGNOSIS — I872 Venous insufficiency (chronic) (peripheral): Secondary | ICD-10-CM | POA: Diagnosis not present

## 2022-08-09 DIAGNOSIS — L03119 Cellulitis of unspecified part of limb: Secondary | ICD-10-CM | POA: Diagnosis not present

## 2022-08-09 MED ORDER — CEPHALEXIN 500 MG PO CAPS: 500.0000 mg | ORAL_CAPSULE | Freq: Four times a day (QID) | ORAL | 0 refills | Status: AC

## 2022-08-09 NOTE — Progress Notes (Signed)
E Visit for Cellulitis  We are sorry that you are not feeling well. Here is how we plan to help!  Based on what you shared with me it looks like you have cellulitis.  Cellulitis looks like areas of skin redness, swelling, and warmth; it develops as a result of bacteria entering under the skin. Little red spots and/or bleeding can be seen in skin, and tiny surface sacs containing fluid can occur. Fever can be present. Cellulitis is almost always on one side of a body, and the lower limbs are the most common site of involvement.   Your picture seems to resemble venous stasis dermatitis more than cellulitis. I am prescribing an antibiotic for possible cellulitis. Treatment for venous stasis dermatitis is elevation of the extremities, compression stockings, moisturize very well using a thick lubricating moisturizer like Eucerin or Lubriderm.   I have prescribed:  Keflex '500mg'$  take one by mouth four times a day for 5 days  HOME CARE:  Take your medications as ordered and take all of them, even if the skin irritation appears to be healing.   GET HELP RIGHT AWAY IF:  Symptoms that don't begin to go away within 48 hours. Severe redness persists or worsens If the area turns color, spreads or swells. If it blisters and opens, develops yellow-brown crust or bleeds. You develop a fever or chills. If the pain increases or becomes unbearable.  Are unable to keep fluids and food down.  MAKE SURE YOU   Understand these instructions. Will watch your condition. Will get help right away if you are not doing well or get worse.  Thank you for choosing an e-visit.  Your e-visit answers were reviewed by a board certified advanced clinical practitioner to complete your personal care plan. Depending upon the condition, your plan could have included both over the counter or prescription medications.  Please review your pharmacy choice. Make sure the pharmacy is open so you can pick up prescription now. If  there is a problem, you may contact your provider through [employer] and have the prescription routed to another pharmacy.  Your safety is important to [place]. If you have drug allergies check your prescription carefully.   For the next 24 hours you can use MyChart to ask questions about today's visit, request a non-urgent call back, or ask for a work or school excuse. You will get an email in the next two days asking about your experience. I hope that your e-visit has been valuable and will speed your recovery.  I have spent 5 minutes in review of e-visit questionnaire, review and updating patient chart, medical decision making and response to patient.   Mar Daring, PA-C

## 2022-08-17 ENCOUNTER — Institutional Professional Consult (permissible substitution): Payer: Medicare Other | Admitting: Plastic Surgery

## 2022-08-28 ENCOUNTER — Ambulatory Visit (INDEPENDENT_AMBULATORY_CARE_PROVIDER_SITE_OTHER): Payer: 59 | Admitting: Podiatry

## 2022-08-28 DIAGNOSIS — M79674 Pain in right toe(s): Secondary | ICD-10-CM

## 2022-08-28 DIAGNOSIS — B351 Tinea unguium: Secondary | ICD-10-CM | POA: Diagnosis not present

## 2022-08-28 DIAGNOSIS — M79675 Pain in left toe(s): Secondary | ICD-10-CM | POA: Diagnosis not present

## 2022-08-28 DIAGNOSIS — E1149 Type 2 diabetes mellitus with other diabetic neurological complication: Secondary | ICD-10-CM | POA: Diagnosis not present

## 2022-08-28 MED ORDER — CICLOPIROX 8 % EX SOLN: Freq: Every day | CUTANEOUS | 0 refills | Status: DC

## 2022-08-28 NOTE — Progress Notes (Signed)
Subjective: Chief Complaint  Patient presents with   Foot Problem    9wks follow up for nail fungus, patient stated things are going well, Stated she thinks theres still some fungus in the great big toes, patient would like a salve for the dry feet    57 year old female presents the above concerns.  She said that the nerve pains are getting bad and she feel that there is something on it and shooting in her foot.  No open sores that she reports.   Objective: AAO x3, NAD DP/PT pulses palpable bilaterally, CRT less than 3 seconds Nails are hypertrophic, dystrophic, brittle, discolored, elongated 10.  Incurvation of the hallux toenail without any signs of infection.  No surrounding redness or drainage. Tenderness nails 1-5 bilaterally. No open lesions or pre-ulcerative lesions are identified today. Hyperkeratotic lesions medial hallux bilaterally with small amount of dried blood under the callus.  Upon debridement there is no underlying ulceration drainage or signs of infection.  No open lesions. No pain with calf compression, swelling, warmth, erythema  Assessment: 57 year old female with symptomatic onychosis, preulcerative calluses; neuropathy  Plan: -All treatment options discussed with the patient including all alternatives, risks, complications.  -Sharp debrided nails x 10 without any complications or bleeding.  He is to monitor the ingrown toenail for any signs or symptoms of infection but currently not able to appreciate any. -She is already on gabapentin for neuropathy.  Discussed topical medication or possibly increasing the dose of gabapentin. -Daily foot inspection. -Patient encouraged to call the office with any questions, concerns, change in symptoms.   Vivi BarrackMatthew R Dulcinea Kinser DPM

## 2022-09-06 DIAGNOSIS — I7300 Raynaud's syndrome without gangrene: Secondary | ICD-10-CM | POA: Insufficient documentation

## 2022-09-06 DIAGNOSIS — H9313 Tinnitus, bilateral: Secondary | ICD-10-CM | POA: Diagnosis not present

## 2022-09-06 DIAGNOSIS — E1142 Type 2 diabetes mellitus with diabetic polyneuropathy: Secondary | ICD-10-CM | POA: Diagnosis not present

## 2022-09-06 DIAGNOSIS — E1169 Type 2 diabetes mellitus with other specified complication: Secondary | ICD-10-CM | POA: Diagnosis not present

## 2022-09-06 DIAGNOSIS — E1165 Type 2 diabetes mellitus with hyperglycemia: Secondary | ICD-10-CM | POA: Diagnosis not present

## 2022-09-06 DIAGNOSIS — H903 Sensorineural hearing loss, bilateral: Secondary | ICD-10-CM | POA: Insufficient documentation

## 2022-09-06 DIAGNOSIS — I10 Essential (primary) hypertension: Secondary | ICD-10-CM | POA: Diagnosis not present

## 2022-09-11 DIAGNOSIS — R002 Palpitations: Secondary | ICD-10-CM | POA: Diagnosis not present

## 2022-09-11 DIAGNOSIS — M170 Bilateral primary osteoarthritis of knee: Secondary | ICD-10-CM | POA: Diagnosis not present

## 2022-09-11 DIAGNOSIS — E1142 Type 2 diabetes mellitus with diabetic polyneuropathy: Secondary | ICD-10-CM | POA: Diagnosis not present

## 2022-09-11 DIAGNOSIS — I10 Essential (primary) hypertension: Secondary | ICD-10-CM | POA: Diagnosis not present

## 2022-09-11 DIAGNOSIS — G629 Polyneuropathy, unspecified: Secondary | ICD-10-CM | POA: Diagnosis not present

## 2022-09-11 DIAGNOSIS — H9319 Tinnitus, unspecified ear: Secondary | ICD-10-CM | POA: Diagnosis not present

## 2022-09-11 DIAGNOSIS — F5102 Adjustment insomnia: Secondary | ICD-10-CM | POA: Diagnosis not present

## 2022-09-12 ENCOUNTER — Telehealth: Payer: 59 | Admitting: Physician Assistant

## 2022-09-12 DIAGNOSIS — R3989 Other symptoms and signs involving the genitourinary system: Secondary | ICD-10-CM

## 2022-09-12 MED ORDER — SULFAMETHOXAZOLE-TRIMETHOPRIM 800-160 MG PO TABS: 1.0000 | ORAL_TABLET | Freq: Two times a day (BID) | ORAL | 0 refills | Status: DC

## 2022-09-12 NOTE — Progress Notes (Signed)
E-Visit for Urinary Problems  We are sorry that you are not feeling well.  Here is how we plan to help!  Based on what you shared with me it looks like you most likely have a simple urinary tract infection.  A UTI (Urinary Tract Infection) is a bacterial infection of the bladder.  Most cases of urinary tract infections are simple to treat but a key part of your care is to encourage you to drink plenty of fluids and watch your symptoms carefully.  I have prescribed Bactrim DS One tablet twice a day for 5 days.  Your symptoms should gradually improve. Call us if the burning in your urine worsens, you develop worsening fever, back pain or pelvic pain or if your symptoms do not resolve after completing the antibiotic.  Urinary tract infections can be prevented by drinking plenty of water to keep your body hydrated.  Also be sure when you wipe, wipe from front to back and don't hold it in!  If possible, empty your bladder every 4 hours.  HOME CARE Drink plenty of fluids Compete the full course of the antibiotics even if the symptoms resolve Remember, when you need to go.go. Holding in your urine can increase the likelihood of getting a UTI! GET HELP RIGHT AWAY IF: You cannot urinate You get a high fever Worsening back pain occurs You see blood in your urine You feel sick to your stomach or throw up You feel like you are going to pass out  MAKE SURE YOU  Understand these instructions. Will watch your condition. Will get help right away if you are not doing well or get worse.   Thank you for choosing an e-visit.  Your e-visit answers were reviewed by a board certified advanced clinical practitioner to complete your personal care plan. Depending upon the condition, your plan could have included both over the counter or prescription medications.  Please review your pharmacy choice. Make sure the pharmacy is open so you can pick up prescription now. If there is a problem, you may contact  your provider through MyChart messaging and have the prescription routed to another pharmacy.  Your safety is important to us. If you have drug allergies check your prescription carefully.   For the next 24 hours you can use MyChart to ask questions about today's visit, request a non-urgent call back, or ask for a work or school excuse. You will get an email in the next two days asking about your experience. I hope that your e-visit has been valuable and will speed your recovery.  I have spent 5 minutes in review of e-visit questionnaire, review and updating patient chart, medical decision making and response to patient.   Ramonita Koenig M Jennifr Gaeta, PA-C  

## 2022-09-14 DIAGNOSIS — N3090 Cystitis, unspecified without hematuria: Secondary | ICD-10-CM | POA: Diagnosis not present

## 2022-09-14 DIAGNOSIS — R319 Hematuria, unspecified: Secondary | ICD-10-CM | POA: Diagnosis not present

## 2022-09-20 ENCOUNTER — Institutional Professional Consult (permissible substitution): Payer: Medicare Other | Admitting: Plastic Surgery

## 2022-09-25 ENCOUNTER — Other Ambulatory Visit: Payer: Self-pay | Admitting: Internal Medicine

## 2022-10-26 ENCOUNTER — Institutional Professional Consult (permissible substitution): Payer: Medicare Other | Admitting: Plastic Surgery

## 2022-10-27 DIAGNOSIS — R300 Dysuria: Secondary | ICD-10-CM | POA: Diagnosis not present

## 2022-10-27 DIAGNOSIS — R319 Hematuria, unspecified: Secondary | ICD-10-CM | POA: Diagnosis not present

## 2022-10-27 DIAGNOSIS — R109 Unspecified abdominal pain: Secondary | ICD-10-CM | POA: Diagnosis not present

## 2022-10-27 DIAGNOSIS — M549 Dorsalgia, unspecified: Secondary | ICD-10-CM | POA: Diagnosis not present

## 2022-10-27 DIAGNOSIS — N2889 Other specified disorders of kidney and ureter: Secondary | ICD-10-CM | POA: Diagnosis not present

## 2022-10-27 DIAGNOSIS — R030 Elevated blood-pressure reading, without diagnosis of hypertension: Secondary | ICD-10-CM | POA: Diagnosis not present

## 2022-10-30 DIAGNOSIS — N2889 Other specified disorders of kidney and ureter: Secondary | ICD-10-CM | POA: Diagnosis not present

## 2022-10-30 DIAGNOSIS — I700 Atherosclerosis of aorta: Secondary | ICD-10-CM | POA: Diagnosis not present

## 2022-10-30 DIAGNOSIS — R319 Hematuria, unspecified: Secondary | ICD-10-CM | POA: Diagnosis not present

## 2022-10-30 DIAGNOSIS — R9341 Abnormal radiologic findings on diagnostic imaging of renal pelvis, ureter, or bladder: Secondary | ICD-10-CM | POA: Diagnosis not present

## 2022-10-30 DIAGNOSIS — N200 Calculus of kidney: Secondary | ICD-10-CM | POA: Diagnosis not present

## 2022-10-30 DIAGNOSIS — N3289 Other specified disorders of bladder: Secondary | ICD-10-CM | POA: Diagnosis not present

## 2022-11-03 DIAGNOSIS — K760 Fatty (change of) liver, not elsewhere classified: Secondary | ICD-10-CM | POA: Diagnosis not present

## 2022-11-03 DIAGNOSIS — N2889 Other specified disorders of kidney and ureter: Secondary | ICD-10-CM | POA: Diagnosis not present

## 2022-11-08 ENCOUNTER — Ambulatory Visit (INDEPENDENT_AMBULATORY_CARE_PROVIDER_SITE_OTHER): Payer: 59 | Admitting: Urology

## 2022-11-08 ENCOUNTER — Encounter: Payer: Self-pay | Admitting: Urology

## 2022-11-08 ENCOUNTER — Other Ambulatory Visit: Payer: Self-pay | Admitting: Internal Medicine

## 2022-11-08 ENCOUNTER — Other Ambulatory Visit: Payer: Self-pay

## 2022-11-08 ENCOUNTER — Encounter: Payer: Self-pay | Admitting: Internal Medicine

## 2022-11-08 ENCOUNTER — Ambulatory Visit: Payer: 59 | Attending: Internal Medicine

## 2022-11-08 ENCOUNTER — Ambulatory Visit: Payer: 59 | Attending: Internal Medicine | Admitting: Internal Medicine

## 2022-11-08 ENCOUNTER — Telehealth: Payer: Self-pay | Admitting: Internal Medicine

## 2022-11-08 VITALS — BP 164/91 | HR 80

## 2022-11-08 VITALS — BP 142/90 | HR 76 | Ht 64.0 in | Wt 270.0 lb

## 2022-11-08 DIAGNOSIS — R079 Chest pain, unspecified: Secondary | ICD-10-CM

## 2022-11-08 DIAGNOSIS — R002 Palpitations: Secondary | ICD-10-CM

## 2022-11-08 DIAGNOSIS — R310 Gross hematuria: Secondary | ICD-10-CM

## 2022-11-08 DIAGNOSIS — N390 Urinary tract infection, site not specified: Secondary | ICD-10-CM

## 2022-11-08 DIAGNOSIS — N2889 Other specified disorders of kidney and ureter: Secondary | ICD-10-CM

## 2022-11-08 DIAGNOSIS — I5032 Chronic diastolic (congestive) heart failure: Secondary | ICD-10-CM

## 2022-11-08 DIAGNOSIS — E7849 Other hyperlipidemia: Secondary | ICD-10-CM | POA: Diagnosis not present

## 2022-11-08 LAB — URINALYSIS, ROUTINE W REFLEX MICROSCOPIC
Bilirubin, UA: NEGATIVE
Glucose, UA: NEGATIVE
Ketones, UA: NEGATIVE
Leukocytes,UA: NEGATIVE
Nitrite, UA: NEGATIVE
Protein,UA: NEGATIVE
Specific Gravity, UA: 1.015 (ref 1.005–1.030)
Urobilinogen, Ur: 0.2 mg/dL (ref 0.2–1.0)
pH, UA: 6.5 (ref 5.0–7.5)

## 2022-11-08 LAB — MICROSCOPIC EXAMINATION: Epithelial Cells (non renal): >10 /hpf — AB (ref 0–10)

## 2022-11-08 LAB — BLADDER SCAN AMB NON-IMAGING: Scan Result: 1

## 2022-11-08 MED ORDER — ISOSORBIDE MONONITRATE ER 30 MG PO TB24: 30.0000 mg | ORAL_TABLET | Freq: Every day | ORAL | 3 refills | Status: DC

## 2022-11-08 NOTE — Progress Notes (Signed)
Cardiology Office Note  Date: 11/08/2022   ID: Christina Cameron, Christina Cameron [birth year], MRN [id number]  PCP:  Lianne Moris, PA-C  Cardiologist:  Marjo Bicker, MD Electrophysiologist:  None   Reason for Office Visit: Chest pain follow-up   History of Present Illness: Christina Cameron is a 57 y.o. female known to have HTN, DM 2, HFpEF, HLD, PSVT presents to cardiology clinic for follow-up visit.  LHC in 2016 showed angiographic normal coronaries.  CT cardiac in 2022 showed coronary calcium score of 0 and a small PFO with very small left-to-right shunt.  Echocardiogram in 2022 showed normal BiV function and no valvular heart disease. Patient's chest pains are deemed to be secondary to coronary microvascular dysfunction, currently on metoprolol tartrate 100 mg twice daily and amlodipine 5 mg once daily.  Patient is here for follow-up visit. She reports having worsening chest tightness currently occurring almost every day, mostly with exertion and a few times at rest.  Associated with SOB and palpitations. She is not checking her blood pressures at home and instructed her to check her BPs every day.  No dizziness, syncope and leg swelling.  Past Medical History:  Diagnosis Date   Arm vein blood clot, unspecified laterality    around 20 years ago as of 08/10/21, Patient doesn't remember what caused it. She was not put on blood thinners.   Arthritis    right knee   Asthma    Cancer (HCC)    cervical 1989   Chest pain    a. normal cors by cath in 11/2014 / 03/02/2020 nuclear stress test demonstrated no perfusion defects consistent with prior infart or current ischemia. Normal study.   Chronic diastolic (congestive) heart failure (HCC)    09/27/20 Echocardiogram in Epic showed LVEF 60 to 65%.   Complication of anesthesia    post-operative nausea & vomiting   COVID-19    05/29/19 & 05/2020 Covid infections   Depression    Diabetes mellitus, type 2 (HCC)    Dysrhythmia    heart palpitations   H/O  cardiovascular stress test 03/02/2020   03/02/2020 Nuclear stress test in Epic showed no perusion defects consistent with prior infarct or ischemia - normal study.   Headache    migraines   High cholesterol    pt takes Crestor   Hypertension    Last cardiology office visit, 10/22/20 with Rennis Harding, NP ( see note in Epic) as of 08/09/21.   Morbid (severe) obesity due to excess calories (HCC)    Neuromuscular disorder (HCC)    severe neuropathy in feet   Osteoarthritis of knee, unspecified    Palpitations    PONV (postoperative nausea and vomiting)    Post-COVID syndrome 05/2019   Pt experienced chest tightness, sob, palpitations & dizziness after Covid infection. See 04/2020 note from Rennis Harding, NP in [place].   PTSD (post-traumatic stress disorder)    Raynaud's syndrome    Sleep apnea 09/24/2020   sleep study indicated moderate sleep apea   SVT (supraventricular tachycardia)    a. s/p ablation by Dr. Ladona Ridgel in 2006.   Wears glasses    prescripton reading glasses    Past Surgical History:  Procedure Laterality Date   CARDIAC CATHETERIZATION N/A 12/14/2014   Procedure: Right/Left Heart Cath and Coronary Angiography;  Surgeon: Tonny Bollman, MD;  Location: [facility] INVASIVE CV LAB;  Service: Cardiovascular;  Laterality: N/A;   CARDIAC ELECTROPHYSIOLOGY STUDY AND ABLATION     around 2006, Patient states that heart rate  was up to 180 bpm.   CESAREAN SECTION     x2 1990, 2005   CYSTOSCOPY  08/17/2021   Procedure: CYSTOSCOPY;  Surgeon: Carrington Clamp, MD;  Location: [facility];  Service: Gynecology;;   ENDOMETRIAL ABLATION     HYSTEROSCOPY WITH D & C N/A 03/11/2021   Procedure: DILATATION AND CURETTAGE /HYSTEROSCOPY;  Surgeon: Marlow Baars, MD;  Location: [facility] OR;  Service: Gynecology;  Laterality: N/A;   OPERATIVE ULTRASOUND N/A 03/11/2021   Procedure: OPERATIVE ULTRASOUND;  Surgeon: Marlow Baars, MD;  Location: [facility] OR;  Service: Gynecology;  Laterality: N/A;  ultrasound  guidance needed   ROBOTIC ASSISTED LAPAROSCOPIC HYSTERECTOMY AND SALPINGECTOMY Bilateral 08/17/2021   Procedure: XI ROBOTIC ASSISTED LAPAROSCOPIC HYSTERECTOMY AND  BILATERAL SALPINGECTOMY AND OOPHERETOMY;  Surgeon: Carrington Clamp, MD;  Location: [facility];  Service: Gynecology;  Laterality: Bilateral;    Current Outpatient Medications  Medication Sig Dispense Refill   albuterol (VENTOLIN HFA) 108 (90 Base) MCG/ACT inhaler Inhale 1-2 puffs into the lungs every 6 (six) hours as needed for wheezing or shortness of breath. 18 g 0   amLODipine (NORVASC) 5 MG tablet Take 1 tablet (5 mg total) by mouth daily. 90 tablet 3   Azelastine-Fluticasone 137-50 MCG/ACT SUSP Place 1 spray into the nose every 12 (twelve) hours. (Patient taking differently: Place 1 spray into the nose as needed.) 23 g 0   busPIRone (BUSPAR) 10 MG tablet Take 10 mg by mouth 2 (two) times daily.     ciclopirox (PENLAC) 8 % solution Apply topically at bedtime. Apply over nail and surrounding skin. Apply daily over previous coat. After seven (7) days, may remove with alcohol and continue cycle. 6.6 mL 0   ezetimibe (ZETIA) 10 MG tablet Take 1 tablet (10 mg total) by mouth daily. 90 tablet 3   FARXIGA 10 MG TABS tablet Take 10 mg by mouth daily.     furosemide (LASIX) 20 MG tablet Take 1 tablet (20 mg total) by mouth daily. 90 tablet 3   gabapentin (NEURONTIN) 300 MG capsule Take 1 capsule (300 mg total) by mouth 3 (three) times daily. Start at bedtime and slowly increase to 3x a day as needed to make sure no side effects like sedation. 90 capsule 11   ibuprofen (ADVIL) 600 MG tablet Take 1 tablet (600 mg total) by mouth every 8 (eight) hours as needed. 30 tablet 0   indapamide (LOZOL) 2.5 MG tablet Take 2.5 mg by mouth daily as needed (fluid).     irbesartan (AVAPRO) 300 MG tablet Take 300 mg by mouth daily.     metFORMIN (GLUCOPHAGE) 500 MG tablet Take 1,000 mg by mouth 2 (two) times daily with a meal.      metoprolol tartrate (LOPRESSOR) 50 MG tablet Take 2 tablets (100 mg total) by mouth 2 (two) times daily. 180 tablet 3   mupirocin ointment (BACTROBAN) 2 % Apply 1 Application topically 2 (two) times daily. 30 g 2   nitroGLYCERIN (NITROSTAT) 0.4 MG SL tablet DISSOLVE ONE TABLET UNDER THE TONGUE EVERY 5 MINUTES AS NEEDED FOR CHEST PAIN.  DO NOT EXCEED A TOTAL OF 3 DOSES IN 15 MINUTES 75 tablet 0   rosuvastatin (CRESTOR) 40 MG tablet Take 1 tablet (40 mg total) by mouth daily. 90 tablet 3   Semaglutide, 2 MG/DOSE, (OZEMPIC, 2 MG/DOSE,) 8 MG/3ML SOPN Inject into the skin. Take on Friday.     sertraline (ZOLOFT) 100 MG tablet Take 150 mg by mouth daily.  sulfamethoxazole-trimethoprim (BACTRIM DS) 800-160 MG tablet Take 1 tablet by mouth 2 (two) times daily. 10 tablet 0   traZODone (DESYREL) 50 MG tablet Take 50 mg by mouth at bedtime as needed for sleep.     No current facility-administered medications for this visit.   Allergies:  Patient has no known allergies.   Social History: The patient  reports that she has never smoked. She has been exposed to tobacco smoke. She has never used smokeless tobacco. She reports current alcohol use. She reports that she does not use drugs.   Family History: The patient's family history includes Cancer in her father and mother; Cancer - Colon in her brother; Hypertension in her brother; Parkinson's disease in her father; Stroke in her brother.   ROS:  Please see the history of present illness. Otherwise, complete review of systems is positive for none.  All other systems are reviewed and negative.   Physical Exam: VS:  There were no vitals taken for this visit., BMI There is no height or weight on file to calculate BMI.  Wt Readings from Last 3 Encounters:  05/01/22 276 lb (125.2 kg)  03/07/22 274 lb 6.4 oz (124.5 kg)  01/23/22 267 lb 12.8 oz (121.5 kg)    General: Patient appears comfortable at rest. HEENT: Conjunctiva and lids normal, oropharynx clear  with moist mucosa. Neck: Supple, no elevated JVP or carotid bruits, no thyromegaly. Lungs: Clear to auscultation, nonlabored breathing at rest. Cardiac: Regular rate and rhythm, no S3 or significant systolic murmur, no pericardial rub. Abdomen: Soft, nontender, no hepatomegaly, bowel sounds present, no guarding or rebound. Extremities: No pitting edema, distal pulses 2+. Skin: Warm and dry. Musculoskeletal: No kyphosis. Neuropsychiatric: Alert and oriented x3, affect grossly appropriate.  ECG:  NSR  Recent Labwork: No results found for requested labs within last 365 days.     Component Value Date/Time   CHOL 263 (H) 10/05/2021 0848   TRIG 167 (H) 10/05/2021 0848   HDL 48 10/05/2021 0848   CHOLHDL 5.5 10/05/2021 0848   VLDL 33 10/05/2021 0848   LDLCALC 182 (H) 10/05/2021 0848   LDLCALC 152 (H) 08/07/2019 1610    Assessment and Plan: Patient is a 57 year old F known to have HTN, DM 2, HLD, HFpEF, palpitations presented to the cardiology clinic for follow-up visit.  # Cardiac chest pain, r/o coronary microvascular dysfunction -Patient has characteristics of cardiac chest pain. LHC in 2016 showed angiographically normal coronaries. CTA cardiac in 2022 showed calcium score of 0 and small PFO. Echocardiogram in 2022 showed normal BiV function and no valvular disease.  Continue metoprolol tartrate 100 mg twice daily and amlodipine 5 mg once daily. Start Imdur 30 mg once daily as her chest tightness resolves with SL NTG 0.4 mg as needed. Obtain cardiac PET to rule out coronary microvascular dysfunction.  # Palpitations -Palpitations occur in association with chest pain. Obtain 2-week event monitor. Continue metoprolol tartrate 100 mg twice daily.  Diltiazem was switched to amlodipine last clinic visit for HTN control.  # HTN, partially controlled -Continue current antihypertensives, amlodipine 5 mg once daily, irbesartan 300 mg once daily and metoprolol tartrate 100 mg twice daily.  #  Chronic diastolic heart failure, compensated -Continue p.o. Lasix 20 mg once daily and Farxiga 10 mg once daily.  # HLD, not at goal (LDL 147 in 04/2022) -Continue atorvastatin 40 mg at bedtime, goal LDL less than 100.  I have spent a total of 30 minutes with patient reviewing chart, EKGs, labs and examining  patient as well as establishing an assessment and plan that was discussed with the patient.  > 50% of time was spent in direct patient care.     Medication Adjustments/Labs and Tests Ordered: Current medicines are reviewed at length with the patient today.  Concerns regarding medicines are outlined above.   Tests Ordered: Orders Placed This Encounter  Procedures   NM PET CT CARDIAC PERFUSION MULTI W/ABSOLUTE BLOODFLOW     Medication Changes: Meds ordered this encounter  Medications   isosorbide mononitrate (IMDUR) 30 MG 24 hr tablet    Sig: Take 1 tablet (30 mg total) by mouth daily.    Dispense:  90 tablet    Refill:  3      Disposition:  Follow up  6 months  Signed, Armie Moren Verne Spurr, MD, 11/08/2022 4:30 PM [facility] [address], [place], [place] [address]

## 2022-11-08 NOTE — Patient Instructions (Addendum)
Medication Instructions:  Your physician has recommended you make the following change in your medication:  Start taking isosorbide mononitrate 30 mg daily  Labwork: None  Testing/Procedures: Your physician has recommended that you wear a Zio monitor.   This monitor is a medical device that records the heart's electrical activity. Doctors most often use these monitors to diagnose arrhythmias. Arrhythmias are problems with the speed or rhythm of the heartbeat. The monitor is a small device applied to your chest. You can wear one while you do your normal daily activities. While wearing this monitor if you have any symptoms to push the button and record what you felt. Once you have worn this monitor for the period of time provider prescribed (for 14 days), you will return the monitor device in the postage paid box. Once it is returned they will download the data collected and provide [place] with a report which the provider will then review and we will call you with those results. Important tips:  Avoid showering during the first 48 hours of wearing the monitor. Avoid excessive sweating to help maximize wear time. Do not submerge the device, no hot tubs, and no swimming pools. Keep any lotions or oils away from the patch. After 24 hours you may shower with the patch on. Take brief showers with your back facing the shower head.  Do not remove patch once it has been placed because that will interrupt data and decrease adhesive wear time. Push the button when you have any symptoms and write down what you were feeling. Once you have completed wearing your monitor, remove and place into box which has postage paid and place in your outgoing mailbox.  If for some reason you have misplaced your box then call our office and we can provide another box and/or mail it off for you.   Follow-Up: Your physician recommends that you schedule a follow-up appointment in: 6 months  Any Other Special Instructions Will  Be Listed Below (If Applicable).  If you need a refill on your cardiac medications before your next appointment, please call your pharmacy.

## 2022-11-08 NOTE — Telephone Encounter (Signed)
Patient called to provide an update for Dr. Jenene Slicker. She states her kidney doctor found a mass on her right kidney and they scheduled an MRI and another scan for Friday.

## 2022-11-08 NOTE — Progress Notes (Signed)
Pt here today for bladder scan. Bladder was scanned and 1 was visualized.   Performed by Kaedyn Polivka, CMA  

## 2022-11-08 NOTE — Progress Notes (Signed)
11/08/2022 2:48 PM   Christina Cameron [birth year] [id number]  Referring provider: Beverly Gust, PA-C [address] [place],  [place] [address]   Renal mass  HPI: Christina Cameron is a 56yo here for evaluation of a right renal mass. She was having gross hematuria for 3 months and was treated multiple times for UTI. 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 No unexplained weight loss   PMH: Past Medical History:  Diagnosis Date   Arm vein blood clot, unspecified laterality    around 20 years ago as of 08/10/21, Patient doesn't remember what caused it. She was not put on blood thinners.   Arthritis    right knee   Asthma    Cancer (HCC)    cervical 1989   Chest pain    a. normal cors by cath in 11/2014 / 03/02/2020 nuclear stress test demonstrated no perfusion defects consistent with prior infart or current ischemia. Normal study.   Chronic diastolic (congestive) heart failure (HCC)    09/27/20 Echocardiogram in Epic showed LVEF 60 to 65%.   Complication of anesthesia    post-operative nausea & vomiting   COVID-19    05/29/19 & 05/2020 Covid infections   Depression    Diabetes mellitus, type 2 (HCC)    Dysrhythmia    heart palpitations   H/O cardiovascular stress test 03/02/2020   03/02/2020 Nuclear stress test in Epic showed no perusion defects consistent with prior infarct or ischemia - normal study.   Headache    migraines   High cholesterol    pt takes Crestor   Hypertension    Last cardiology office visit, 10/22/20 with Christina Harding, NP ( see note in Epic) as of 08/09/21.   Morbid (severe) obesity due to excess calories (HCC)    Neuromuscular disorder (HCC)    severe neuropathy in feet   Osteoarthritis of knee, unspecified    Palpitations    PONV (postoperative nausea and vomiting)    Post-COVID syndrome 05/2019   Pt experienced chest tightness, sob, palpitations & dizziness after Covid infection. See 04/2020 note from  Christina Harding, NP in [place].   PTSD (post-traumatic stress disorder)    Raynaud's syndrome    Sleep apnea 09/24/2020   sleep study indicated moderate sleep apea   SVT (supraventricular tachycardia)    a. s/p ablation by Christina Cameron in 2006.   Wears glasses    prescripton reading glasses    Surgical History: Past Surgical History:  Procedure Laterality Date   CARDIAC CATHETERIZATION N/A 12/14/2014   Procedure: Right/Left Heart Cath and Coronary Angiography;  Surgeon: Christina Bollman, MD;  Location: [facility] INVASIVE CV LAB;  Service: Cardiovascular;  Laterality: N/A;   CARDIAC ELECTROPHYSIOLOGY STUDY AND ABLATION     around 2006, Patient states that heart rate was up to 180 bpm.   CESAREAN SECTION     x2 1990, 2005   CYSTOSCOPY  08/17/2021   Procedure: CYSTOSCOPY;  Surgeon: Christina Clamp, MD;  Location: [facility];  Service: Gynecology;;   ENDOMETRIAL ABLATION     HYSTEROSCOPY WITH D & C N/A 03/11/2021   Procedure: DILATATION AND CURETTAGE /HYSTEROSCOPY;  Surgeon: Christina Baars, MD;  Location: [facility] OR;  Service: Gynecology;  Laterality: N/A;   OPERATIVE ULTRASOUND N/A 03/11/2021   Procedure: OPERATIVE ULTRASOUND;  Surgeon: Christina Baars, MD;  Location: [facility] OR;  Service: Gynecology;  Laterality: N/A;  ultrasound guidance needed   ROBOTIC  ASSISTED LAPAROSCOPIC HYSTERECTOMY AND SALPINGECTOMY Bilateral 08/17/2021   Procedure: XI ROBOTIC ASSISTED LAPAROSCOPIC HYSTERECTOMY AND  BILATERAL SALPINGECTOMY AND OOPHERETOMY;  Surgeon: Christina Clamp, MD;  Location: [facility];  Service: Gynecology;  Laterality: Bilateral;    Home Medications:  Allergies as of 11/08/2022   No Known Allergies      Medication List        Accurate as of November 08, 2022  2:48 PM. If you have any questions, ask your nurse or doctor.          albuterol 108 (90 Base) MCG/ACT inhaler Commonly known as: VENTOLIN HFA Inhale 1-2 puffs into the lungs every 6 (six) hours as needed for  wheezing or shortness of breath.   amLODipine 5 MG tablet Commonly known as: NORVASC Take 1 tablet (5 mg total) by mouth daily.   Azelastine-Fluticasone 137-50 MCG/ACT Susp Place 1 spray into the nose every 12 (twelve) hours. What changed:  when to take this reasons to take this   buPROPion 150 MG 24 hr tablet Commonly known as: WELLBUTRIN XL Take 150 mg by mouth every morning.   busPIRone 10 MG tablet Commonly known as: BUSPAR Take 10 mg by mouth 2 (two) times daily.   cetirizine 10 MG tablet Commonly known as: ZYRTEC Take by mouth.   ciclopirox 8 % solution Commonly known as: Penlac Apply topically at bedtime. Apply over nail and surrounding skin. Apply daily over previous coat. After seven (7) days, may remove with alcohol and continue cycle.   diltiazem 90 MG tablet Commonly known as: CARDIZEM Take by mouth.   estradiol 0.5 MG tablet Commonly known as: ESTRACE Take 0.5 mg by mouth daily.   ezetimibe 10 MG tablet Commonly known as: ZETIA Take 1 tablet (10 mg total) by mouth daily.   Farxiga 10 MG Tabs tablet Generic drug: dapagliflozin propanediol Take 10 mg by mouth daily.   fluticasone 110 MCG/ACT inhaler Commonly known as: FLOVENT HFA Inhale into the lungs.   furosemide 20 MG tablet Commonly known as: LASIX Take 1 tablet (20 mg total) by mouth daily.   gabapentin 300 MG capsule Commonly known as: NEURONTIN Take 1 capsule (300 mg total) by mouth 3 (three) times daily. Start at bedtime and slowly increase to 3x a day as needed to make sure no side effects like sedation.   hydrOXYzine 50 MG tablet Commonly known as: ATARAX TAKE 2 TABLETS BY MOUTH ONCE DAILY AT NIGHT   ibuprofen 600 MG tablet Commonly known as: ADVIL Take 1 tablet (600 mg total) by mouth every 8 (eight) hours as needed.   indapamide 2.5 MG tablet Commonly known as: LOZOL Take 2.5 mg by mouth daily as needed (fluid).   irbesartan 300 MG tablet Commonly known as: AVAPRO Take 300  mg by mouth daily.   isosorbide mononitrate 30 MG 24 hr tablet Commonly known as: IMDUR Take 1 tablet (30 mg total) by mouth daily. Started by: Marjo Bicker, MD   lamoTRIgine 200 MG tablet Commonly known as: LAMICTAL Take 200 mg by mouth daily.   metFORMIN 500 MG tablet Commonly known as: GLUCOPHAGE Take 1,000 mg by mouth 2 (two) times daily with a meal.   metoprolol tartrate 50 MG tablet Commonly known as: LOPRESSOR Take 2 tablets (100 mg total) by mouth 2 (two) times daily.   mupirocin ointment 2 % Commonly known as: BACTROBAN Apply 1 Application topically 2 (two) times daily.   nitroGLYCERIN 0.4 MG SL tablet Commonly known as: NITROSTAT DISSOLVE ONE TABLET  UNDER THE TONGUE EVERY 5 MINUTES AS NEEDED FOR CHEST PAIN.  DO NOT EXCEED A TOTAL OF 3 DOSES IN 15 MINUTES   omeprazole 40 MG capsule Commonly known as: PRILOSEC Take by mouth.   Ozempic (2 MG/DOSE) 8 MG/3ML Sopn Generic drug: Semaglutide (2 MG/DOSE) Inject into the skin. Take on Friday.   rosuvastatin 40 MG tablet Commonly known as: CRESTOR Take 1 tablet (40 mg total) by mouth daily.   sertraline 100 MG tablet Commonly known as: ZOLOFT Take 150 mg by mouth daily.   sulfamethoxazole-trimethoprim 800-160 MG tablet Commonly known as: BACTRIM DS Take 1 tablet by mouth 2 (two) times daily.   topiramate 25 MG tablet Commonly known as: TOPAMAX SMARTSIG:1 Tablet(s) By Mouth Every Evening   traZODone 50 MG tablet Commonly known as: DESYREL Take 50 mg by mouth at bedtime as needed for sleep.        Allergies: No Known Allergies  Family History: Family History  Problem Relation Age of Onset   Cancer Mother    Cancer Father    Parkinson's disease Father    Stroke Brother    Hypertension Brother    Cancer - Colon Brother     Social History:  reports that she has never smoked. She has been exposed to tobacco smoke. She has never used smokeless tobacco. She reports current alcohol use. She reports  that she does not use drugs.  ROS: All other review of systems were reviewed and are negative except what is noted above in HPI  Physical Exam: BP (!) 164/91   Pulse 80   Constitutional:  Alert and oriented, No acute distress. HEENT: [place] AT, moist mucus membranes.  Trachea midline, no masses. Cardiovascular: No clubbing, cyanosis, or edema. Respiratory: Normal respiratory effort, no increased work of breathing. GI: Abdomen is soft, nontender, nondistended, no abdominal masses GU: No CVA tenderness.  Lymph: No cervical or inguinal lymphadenopathy. Skin: No rashes, bruises or suspicious lesions. Neurologic: Grossly intact, no focal deficits, moving all 4 extremities. Psychiatric: Normal mood and affect.  Laboratory Data: Lab Results  Component Value Date   WBC 8.1 08/15/2021   HGB 14.8 08/15/2021   HCT 43.8 08/15/2021   MCV 89.2 08/15/2021   PLT 247 08/15/2021    Lab Results  Component Value Date   CREATININE 0.76 10/05/2021    No results found for: "PSA"  No results found for: "TESTOSTERONE"  Lab Results  Component Value Date   HGBA1C 10.3 04/16/2019    Urinalysis    Component Value Date/Time   COLORURINE YELLOW 07/07/2018 0812   APPEARANCEUR CLEAR 07/07/2018 0812   LABSPEC 1.020 07/07/2018 0812   PHURINE 5.5 07/07/2018 0812   GLUCOSEU >=500 (A) 07/07/2018 0812   HGBUR NEGATIVE 07/07/2018 0812   BILIRUBINUR negative 11/28/2019 1455   KETONESUR negative 11/28/2019 1455   KETONESUR TRACE (A) 07/07/2018 0812   PROTEINUR negative 11/28/2019 1455   PROTEINUR NEGATIVE 07/07/2018 0812   UROBILINOGEN 0.2 11/28/2019 1455   UROBILINOGEN 0.2 04/28/2014 0331   NITRITE Negative 11/28/2019 1455   NITRITE NEGATIVE 07/07/2018 0812   LEUKOCYTESUR Negative 11/28/2019 1455    Lab Results  Component Value Date   BACTERIA RARE (A) 07/07/2018    Pertinent Imaging: CT 11/03/22: Images reviewed and discussed with the patient No results found for this or any previous  visit.  No results found for this or any previous visit.  No results found for this or any previous visit.  No results found for this or any previous visit.  No results found for this or any previous visit.  No valid procedures specified. No results found for this or any previous visit.  No results found for this or any previous visit.   Assessment & Plan:    1. Right kidney mass CMP, CXR MRI abd due to concern for IVC invasion - Urinalysis, Routine w reflex microscopic - BLADDER SCAN AMB NON-IMAGING   No follow-ups on file.  Wilkie Aye, MD  [facility] [place]

## 2022-11-09 LAB — COMPREHENSIVE METABOLIC PANEL
ALT: 15 IU/L (ref 0–32)
AST: 17 IU/L (ref 0–40)
Albumin/Globulin Ratio: 1.7
Albumin: 4.3 g/dL (ref 3.8–4.9)
Alkaline Phosphatase: 54 IU/L (ref 44–121)
BUN/Creatinine Ratio: 20 (ref 9–23)
BUN: 16 mg/dL (ref 6–24)
Bilirubin Total: 0.3 mg/dL (ref 0.0–1.2)
CO2: 26 mmol/L (ref 20–29)
Calcium: 9.1 mg/dL (ref 8.7–10.2)
Chloride: 101 mmol/L (ref 96–106)
Creatinine, Ser: 0.82 mg/dL (ref 0.57–1.00)
Globulin, Total: 2.5 g/dL (ref 1.5–4.5)
Glucose: 116 mg/dL — ABNORMAL HIGH (ref 70–99)
Potassium: 4.8 mmol/L (ref 3.5–5.2)
Sodium: 142 mmol/L (ref 134–144)
Total Protein: 6.8 g/dL (ref 6.0–8.5)
eGFR: 83 mL/min/{1.73_m2} (ref >59)

## 2022-11-09 NOTE — Addendum Note (Signed)
Addended by: Marlyn Corporal A on: 11/09/2022 10:50 AM   Modules accepted: Orders

## 2022-11-10 ENCOUNTER — Ambulatory Visit (HOSPITAL_COMMUNITY)
Admission: RE | Admit: 2022-11-10 | Discharge: 2022-11-10 | Disposition: A | Discharge status: Home / Self Care | Payer: 59 | Source: Ambulatory Visit | Attending: Urology | Admitting: Urology

## 2022-11-10 DIAGNOSIS — N2889 Other specified disorders of kidney and ureter: Secondary | ICD-10-CM | POA: Diagnosis not present

## 2022-11-10 DIAGNOSIS — N281 Cyst of kidney, acquired: Secondary | ICD-10-CM | POA: Diagnosis not present

## 2022-11-10 MED ORDER — GADOBUTROL 1 MMOL/ML IV SOLN
10.0000 mL | Freq: Once | INTRAVENOUS | Status: AC | PRN
  Administered 2022-11-10: 10 mL via INTRAVENOUS

## 2022-11-11 ENCOUNTER — Encounter: Payer: Self-pay | Admitting: Urology

## 2022-11-13 NOTE — Progress Notes (Signed)
Order(s) created erroneously. Erroneous order ID: [id number]  Order moved by: Leonia Corona  Order move date/time: 11/13/2022 5:07 PM  Source Patient: [id number]  Source Contact: 11/08/2022  Destination Patient: [id number]  Destination Contact: 11/08/2022

## 2022-11-14 ENCOUNTER — Encounter: Payer: Self-pay | Admitting: Urology

## 2022-11-14 NOTE — Patient Instructions (Signed)

## 2022-11-16 ENCOUNTER — Ambulatory Visit (HOSPITAL_COMMUNITY)
Admission: RE | Admit: 2022-11-16 | Discharge: 2022-11-16 | Disposition: A | Discharge status: Home / Self Care | Payer: 59 | Source: Ambulatory Visit | Attending: Urology | Admitting: Urology

## 2022-11-16 DIAGNOSIS — Z0389 Encounter for observation for other suspected diseases and conditions ruled out: Secondary | ICD-10-CM | POA: Diagnosis not present

## 2022-11-16 DIAGNOSIS — N2889 Other specified disorders of kidney and ureter: Secondary | ICD-10-CM | POA: Diagnosis not present

## 2022-11-17 ENCOUNTER — Ambulatory Visit (INDEPENDENT_AMBULATORY_CARE_PROVIDER_SITE_OTHER): Payer: 59 | Admitting: Urology

## 2022-11-17 VITALS — BP 133/78 | HR 90 | Ht 64.0 in | Wt 270.0 lb

## 2022-11-17 DIAGNOSIS — N390 Urinary tract infection, site not specified: Secondary | ICD-10-CM

## 2022-11-17 DIAGNOSIS — N2889 Other specified disorders of kidney and ureter: Secondary | ICD-10-CM | POA: Diagnosis not present

## 2022-11-17 NOTE — Progress Notes (Unsigned)
11/17/2022 9:01 AM   Christina Cameron Jul 21, 1965 [id number]  Referring provider: Lianne Moris, PA-C [address] [place],  [place] [address]  No chief complaint on file.   HPI:    PMH: Past Medical History:  Diagnosis Date   Arm vein blood clot, unspecified laterality    around 20 years ago as of 08/10/21, Patient doesn't remember what caused it. She was not put on blood thinners.   Arthritis    right knee   Asthma    Cancer (HCC)    cervical 1989   Chest pain    a. normal cors by cath in 11/2014 / 03/02/2020 nuclear stress test demonstrated no perfusion defects consistent with prior infart or current ischemia. Normal study.   Chronic diastolic (congestive) heart failure (HCC)    09/27/20 Echocardiogram in Epic showed LVEF 60 to 65%.   Complication of anesthesia    post-operative nausea & vomiting   COVID-19    05/29/19 & 05/2020 Covid infections   Depression    Diabetes mellitus, type 2 (HCC)    Dysrhythmia    heart palpitations   H/O cardiovascular stress test 03/02/2020   03/02/2020 Nuclear stress test in Epic showed no perusion defects consistent with prior infarct or ischemia - normal study.   Headache    migraines   High cholesterol    pt takes Crestor   Hypertension    Last cardiology office visit, 10/22/20 with Rennis Harding, NP ( see note in Epic) as of 08/09/21.   Morbid (severe) obesity due to excess calories (HCC)    Neuromuscular disorder (HCC)    severe neuropathy in feet   Osteoarthritis of knee, unspecified    Palpitations    PONV (postoperative nausea and vomiting)    Post-COVID syndrome 05/2019   Pt experienced chest tightness, sob, palpitations & dizziness after Covid infection. See 04/2020 note from Rennis Harding, NP in [place].   PTSD (post-traumatic stress disorder)    Raynaud's syndrome    Sleep apnea 09/24/2020   sleep study indicated moderate sleep apea   SVT (supraventricular tachycardia)    a. s/p ablation by Dr. Ladona Ridgel in 2006.   Wears glasses     prescripton reading glasses    Surgical History: Past Surgical History:  Procedure Laterality Date   CARDIAC CATHETERIZATION N/A 12/14/2014   Procedure: Right/Left Heart Cath and Coronary Angiography;  Surgeon: Tonny Bollman, MD;  Location: [facility] INVASIVE CV LAB;  Service: Cardiovascular;  Laterality: N/A;   CARDIAC ELECTROPHYSIOLOGY STUDY AND ABLATION     around 2006, Patient states that heart rate was up to 180 bpm.   CESAREAN SECTION     x2 1990, 2005   CYSTOSCOPY  08/17/2021   Procedure: CYSTOSCOPY;  Surgeon: Carrington Clamp, MD;  Location: [facility];  Service: Gynecology;;   ENDOMETRIAL ABLATION     HYSTEROSCOPY WITH D & C N/A 03/11/2021   Procedure: DILATATION AND CURETTAGE /HYSTEROSCOPY;  Surgeon: Marlow Baars, MD;  Location: [facility] OR;  Service: Gynecology;  Laterality: N/A;   OPERATIVE ULTRASOUND N/A 03/11/2021   Procedure: OPERATIVE ULTRASOUND;  Surgeon: Marlow Baars, MD;  Location: [facility] OR;  Service: Gynecology;  Laterality: N/A;  ultrasound guidance needed   ROBOTIC ASSISTED LAPAROSCOPIC HYSTERECTOMY AND SALPINGECTOMY Bilateral 08/17/2021   Procedure: XI ROBOTIC ASSISTED LAPAROSCOPIC HYSTERECTOMY AND  BILATERAL SALPINGECTOMY AND OOPHERETOMY;  Surgeon: Carrington Clamp, MD;  Location: [facility];  Service: Gynecology;  Laterality: Bilateral;    Home Medications:  Allergies as of 11/17/2022  No Known Allergies      Medication List        Accurate as of November 17, 2022  9:01 AM. If you have any questions, ask your nurse or doctor.          albuterol 108 (90 Base) MCG/ACT inhaler Commonly known as: VENTOLIN HFA Inhale 1-2 puffs into the lungs every 6 (six) hours as needed for wheezing or shortness of breath.   amLODipine 5 MG tablet Commonly known as: NORVASC Take 1 tablet (5 mg total) by mouth daily.   Azelastine-Fluticasone 137-50 MCG/ACT Susp Place 1 spray into the nose every 12 (twelve) hours. What changed:  when to take  this reasons to take this   buPROPion 150 MG 24 hr tablet Commonly known as: WELLBUTRIN XL Take 150 mg by mouth every morning.   busPIRone 10 MG tablet Commonly known as: BUSPAR Take 10 mg by mouth 2 (two) times daily.   cetirizine 10 MG tablet Commonly known as: ZYRTEC Take by mouth.   ciclopirox 8 % solution Commonly known as: Penlac Apply topically at bedtime. Apply over nail and surrounding skin. Apply daily over previous coat. After seven (7) days, may remove with alcohol and continue cycle.   diltiazem 90 MG tablet Commonly known as: CARDIZEM Take by mouth.   estradiol 0.5 MG tablet Commonly known as: ESTRACE Take 0.5 mg by mouth daily.   ezetimibe 10 MG tablet Commonly known as: ZETIA Take 1 tablet (10 mg total) by mouth daily.   Farxiga 10 MG Tabs tablet Generic drug: dapagliflozin propanediol Take 10 mg by mouth daily.   fluticasone 110 MCG/ACT inhaler Commonly known as: FLOVENT HFA Inhale into the lungs.   furosemide 20 MG tablet Commonly known as: LASIX Take 1 tablet (20 mg total) by mouth daily.   gabapentin 300 MG capsule Commonly known as: NEURONTIN Take 1 capsule (300 mg total) by mouth 3 (three) times daily. Start at bedtime and slowly increase to 3x a day as needed to make sure no side effects like sedation.   hydrOXYzine 50 MG tablet Commonly known as: ATARAX TAKE 2 TABLETS BY MOUTH ONCE DAILY AT NIGHT   ibuprofen 600 MG tablet Commonly known as: ADVIL Take 1 tablet (600 mg total) by mouth every 8 (eight) hours as needed.   indapamide 2.5 MG tablet Commonly known as: LOZOL Take 2.5 mg by mouth daily as needed (fluid).   irbesartan 300 MG tablet Commonly known as: AVAPRO Take 300 mg by mouth daily.   isosorbide mononitrate 30 MG 24 hr tablet Commonly known as: IMDUR Take 1 tablet (30 mg total) by mouth daily.   lamoTRIgine 200 MG tablet Commonly known as: LAMICTAL Take 200 mg by mouth daily.   metFORMIN 500 MG tablet Commonly  known as: GLUCOPHAGE Take 1,000 mg by mouth 2 (two) times daily with a meal.   metoprolol tartrate 50 MG tablet Commonly known as: LOPRESSOR Take 2 tablets (100 mg total) by mouth 2 (two) times daily.   mupirocin ointment 2 % Commonly known as: BACTROBAN Apply 1 Application topically 2 (two) times daily.   nitroGLYCERIN 0.4 MG SL tablet Commonly known as: NITROSTAT DISSOLVE ONE TABLET UNDER THE TONGUE EVERY 5 MINUTES AS NEEDED FOR CHEST PAIN.  DO NOT EXCEED A TOTAL OF 3 DOSES IN 15 MINUTES   omeprazole 40 MG capsule Commonly known as: PRILOSEC Take by mouth.   Ozempic (2 MG/DOSE) 8 MG/3ML Sopn Generic drug: Semaglutide (2 MG/DOSE) Inject into the skin. Take on Friday.  rosuvastatin 40 MG tablet Commonly known as: CRESTOR Take 1 tablet (40 mg total) by mouth daily.   sertraline 100 MG tablet Commonly known as: ZOLOFT Take 150 mg by mouth daily.   sulfamethoxazole-trimethoprim 800-160 MG tablet Commonly known as: BACTRIM DS Take 1 tablet by mouth 2 (two) times daily.   topiramate 25 MG tablet Commonly known as: TOPAMAX SMARTSIG:1 Tablet(s) By Mouth Every Evening   traZODone 50 MG tablet Commonly known as: DESYREL Take 50 mg by mouth at bedtime as needed for sleep.        Allergies: No Known Allergies  Family History: Family History  Problem Relation Age of Onset   Cancer Mother    Cancer Father    Parkinson's disease Father    Stroke Brother    Hypertension Brother    Cancer - Colon Brother     Social History:  reports that she has never smoked. She has been exposed to tobacco smoke. She has never used smokeless tobacco. She reports current alcohol use. She reports that she does not use drugs.  ROS: All other review of systems were reviewed and are negative except what is noted above in HPI  Physical Exam: BP 133/78   Pulse 90   Ht 5\' 4"  (1.626 m)   Wt 270 lb (122.5 kg)   BMI 46.35 kg/m   Constitutional:  Alert and oriented, No acute  distress. HEENT: [place] AT, moist mucus membranes.  Trachea midline, no masses. Cardiovascular: No clubbing, cyanosis, or edema. Respiratory: Normal respiratory effort, no increased work of breathing. GI: Abdomen is soft, nontender, nondistended, no abdominal masses GU: No CVA tenderness.  Lymph: No cervical or inguinal lymphadenopathy. Skin: No rashes, bruises or suspicious lesions. Neurologic: Grossly intact, no focal deficits, moving all 4 extremities. Psychiatric: Normal mood and affect.  Laboratory Data: Lab Results  Component Value Date   WBC 8.1 08/15/2021   HGB 14.8 08/15/2021   HCT 43.8 08/15/2021   MCV 89.2 08/15/2021   PLT 247 08/15/2021    Lab Results  Component Value Date   CREATININE 0.82 11/08/2022    No results found for: "PSA"  No results found for: "TESTOSTERONE"  Lab Results  Component Value Date   HGBA1C 10.3 04/16/2019    Urinalysis    Component Value Date/Time   COLORURINE YELLOW 07/07/2018 0812   APPEARANCEUR Clear 11/08/2022 1417   LABSPEC 1.020 07/07/2018 0812   PHURINE 5.5 07/07/2018 0812   GLUCOSEU Negative 11/08/2022 1417   HGBUR NEGATIVE 07/07/2018 0812   BILIRUBINUR Negative 11/08/2022 1417   KETONESUR negative 11/28/2019 1455   KETONESUR TRACE (A) 07/07/2018 0812   PROTEINUR Negative 11/08/2022 1417   PROTEINUR NEGATIVE 07/07/2018 0812   UROBILINOGEN 0.2 11/28/2019 1455   UROBILINOGEN 0.2 04/28/2014 0331   NITRITE Negative 11/08/2022 1417   NITRITE NEGATIVE 07/07/2018 0812   LEUKOCYTESUR Negative 11/08/2022 1417    Lab Results  Component Value Date   LABMICR See below: 11/08/2022   WBCUA 0-5 11/08/2022   LABEPIT >10 (A) 11/08/2022   BACTERIA Moderate (A) 11/08/2022    Pertinent Imaging: *** No results found for this or any previous visit.  No results found for this or any previous visit.  No results found for this or any previous visit.  No results found for this or any previous visit.  No results found for this or  any previous visit.  No valid procedures specified. No results found for this or any previous visit.  No results found for this or any  previous visit.   Assessment & Plan:    1.  *** - Urinalysis, Routine w reflex microscopic   No follow-ups on file.  Wilkie Aye, MD  [facility] [place]

## 2022-11-23 ENCOUNTER — Encounter: Payer: Self-pay | Admitting: Urology

## 2022-11-23 ENCOUNTER — Telehealth: Payer: Self-pay

## 2022-11-23 NOTE — Telephone Encounter (Signed)
I spoke with Christina Cameron. We have discussed possible surgery dates and 12/25/2022 was agreed upon by all parties. Patient given information about surgery date, what to expect pre-operatively and post operatively.    We discussed that a pre-op nurse will be calling to set up the pre-op visit that will take place prior to surgery. Informed patient that our office will communicate any additional care to be provided after surgery.    Patients questions or concerns were discussed during our call. Advised to call our office should there be any additional information, questions or concerns that arise. Patient verbalized understanding.

## 2022-11-23 NOTE — Patient Instructions (Signed)

## 2022-11-24 ENCOUNTER — Telehealth (HOSPITAL_COMMUNITY): Payer: Self-pay | Admitting: *Deleted

## 2022-11-24 NOTE — Addendum Note (Signed)
Addended by: Eustace Moore on: 11/24/2022 10:24 AM   Modules accepted: Orders

## 2022-11-24 NOTE — Addendum Note (Signed)
Addended by: Eustace Moore on: 11/24/2022 11:23 AM   Modules accepted: Orders

## 2022-11-24 NOTE — Telephone Encounter (Signed)
Attempted to call patient regarding upcoming cardiac PET appointment. Left message on voicemail with name and callback number  Larey Brick RN Navigator Cardiac Imaging [facility] [phone] Office [phone] Cell  Reminder for patient to with Imdur the day before and caffeine 12 hours prior to her cardiac PET study.

## 2022-11-26 ENCOUNTER — Encounter: Payer: Self-pay | Admitting: Urology

## 2022-11-27 ENCOUNTER — Telehealth (HOSPITAL_COMMUNITY): Payer: Self-pay | Admitting: *Deleted

## 2022-11-27 NOTE — Telephone Encounter (Signed)
Second attempt to call patient regarding upcoming cardiac PET appointment. Left message on voicemail with name and callback number  Larey Brick RN Navigator Cardiac Imaging [facility] [phone] Office [phone] Cell

## 2022-11-27 NOTE — Telephone Encounter (Signed)
Patient returning call about her upcoming cardiac imaging study; pt verbalizes understanding of appt date/time, parking situation and where to check in, pre-test NPO status, and verified current allergies; name and call back number provided for further questions should they arise  Larey Brick RN Navigator Cardiac Imaging [facility] [phone] office [phone] cell  Patient aware to avoid caffeine and Imdur for her cardiac PET scan.

## 2022-11-28 ENCOUNTER — Ambulatory Visit: Payer: 59 | Admitting: Podiatry

## 2022-11-28 ENCOUNTER — Ambulatory Visit (HOSPITAL_COMMUNITY): Admission: RE | Admit: 2022-11-28 | Payer: 59 | Source: Ambulatory Visit

## 2022-11-28 DIAGNOSIS — Z79899 Other long term (current) drug therapy: Secondary | ICD-10-CM | POA: Diagnosis not present

## 2022-11-29 ENCOUNTER — Institutional Professional Consult (permissible substitution): Payer: Medicare Other | Admitting: Plastic Surgery

## 2022-11-29 NOTE — Addendum Note (Signed)
Addended by: Herbert Deaner on: 11/29/2022 11:46 AM   Modules accepted: Orders

## 2022-12-04 NOTE — Telephone Encounter (Signed)
Patient states that she has had a more constant pain in her lower back rather than before where it would come and go.  She is asking if there is a concern that the cancer is spreading or this is a results from the mass?

## 2022-12-06 ENCOUNTER — Telehealth: Payer: Self-pay | Admitting: Internal Medicine

## 2022-12-06 NOTE — Telephone Encounter (Signed)
Pt states the Urology dept is not requesting clearance but pt would like to know if she need to have Pet scan before her surgery 12/25/22.

## 2022-12-06 NOTE — Telephone Encounter (Signed)
Pt notified and voiced understanding 

## 2022-12-12 NOTE — Telephone Encounter (Signed)
Patient aware that per Christina Cameron her PAT testing is 07/19 at 1:15pm.  I informed patient she may have to do a pregnancy test the day of her surgery since they require the test 7 days prior to surgery, if it applies to her.  Patient voiced understanding.

## 2022-12-14 NOTE — Patient Instructions (Addendum)
Christina Cameron  12/14/2022     @PREFPERIOPPHARMACY @   Your procedure is scheduled on  12/25/2022.   Report to [facility] at  0700 A.M.   Call this number if you have problems the morning of surgery:  [phone]  If you experience any cold or flu symptoms such as cough, fever, chills, shortness of breath, etc. between now and your scheduled surgery, please notify us at the above number.   Remember:  Do not eat or drink after midnight.       Your last dose of Ozempic should be on 12/07/2022.     Use your inhaler before you come and bring your rescue inhaler with you.     Take these medicines the morning of surgery with A SIP OF WATER        amlodipine, rexulti, wellbutrin, buspar, zyrtec, diltiazem, isosorbide, lamictal, metoprolol, sertraline, tramadol(if needed).     Do not wear jewelry, make-up or nail polish, including gel polish,  artificial nails, or any other type of covering on natural nails (fingers and  toes).  Do not wear lotions, powders, or perfumes, or deodorant.  Do not shave 48 hours prior to surgery.  Men may shave face and neck.  Do not bring valuables to the hospital.  [facility] is not responsible for any belongings or valuables.  Contacts, dentures or bridgework may not be worn into surgery.  Leave your suitcase in the car.  After surgery it may be brought to your room.  For patients admitted to the hospital, discharge time will be determined by your treatment team.  Patients discharged the day of surgery will not be allowed to drive home and must have someone with them for 24 hours.      Special instructions:   DO NOT smoke tobacco or vape for 24 hours before your procedure.  Please read over the following fact sheets that you were given. Pain Booklet, Coughing and Deep Breathing, Blood Transfusion Information, Lab Information, Surgical Site Infection Prevention, Anesthesia Post-op Instructions, and Care and Recovery After  Surgery         Minimally Invasive Nephrectomy, Care After This sheet gives you information about how to care for yourself after your procedure. Your health care provider may also give you more specific instructions. If you have problems or questions, contact your health care provider. What can I expect after the procedure? After the procedure, it is common to have: Pain. Soreness and numbness in the area around your incisions. Follow these instructions at home: Medicines  Take over-the-counter and prescription medicines only as told by your health care provider. Avoid using NSAIDs regularly over a long period of time. These include aspirin and ibuprofen. Doing so may damage your remaining kidney. Ask your health care provider if the medicine prescribed to you: Requires you to avoid driving or using machinery. Can cause constipation. You may need to take these actions to prevent or treat constipation: Drink enough fluid to keep your urine pale yellow. Take over-the-counter or prescription medicines. Eat foods that are high in fiber, such as beans, whole grains, and fresh fruits and vegetables. Limit foods that are high in fat and processed sugars, such as fried or sweet foods. Incision care and drain care  Follow instructions from your health care provider about how to take care of your incisions. Make sure you: Wash your hands with soap and water for at least 20 seconds before and after you change your bandage (  dressing). If soap and water are not available, use hand sanitizer. Change your dressing as told by your health care provider. Leave stitches (sutures), skin glue, or adhesive strips in place. These skin closures may need to be in place for 2 weeks or longer. If adhesive strip edges start to loosen and curl up, you may trim the loose edges. Do not remove adhesive strips completely unless your health care provider tells you to do that. Check your incision area every day for  signs of infection. Check for: Redness, swelling, or more pain. Fluid or blood. Warmth. Pus or a bad smell. If you have a drain in place, follow your health care provider's instructions for drain care. This may include emptying the drain and keeping track of the amount of drainage. Activity  Rest as told by your health care provider. Avoid sitting for a long time without moving. Get up to take short walks every 1-2 hours. This is important to improve blood flow and breathing. Ask for help if you feel weak or unsteady. Do not lift anything that is heavier than 10 lb (4.5 kg), or the limit that you are told, until your health care provider says that it is safe. Do not play contact sports. Doing so may damage your remaining kidney. Return to your normal activities as told by your health care provider. Ask your health care provider what activities are safe for you. Catheter care If you have a urinary catheter, care for it as told by your health care provider. You may be told to: Wash your hands with soap and water for at least 20 seconds before and after touching the catheter, tubing, or drainage bag. If soap and water are not available, use hand sanitizer. Empty the drainage bag every 2-4 hours, or more often if needed. Do not let the bag get completely full. Monitor the amount and color of your urine as told by your health care provider. Keep the area around the catheter clean and dry. Make sure to keep the catheter secured to avoid pulling and accidental removal. Always keep the urine collection bag below the level of your bladder. Check the catheter tubing regularly to make sure there are no kinks or blockages. Make sure that the catheter is not placed under water. General instructions Do not take baths, swim, or use a hot tub until your health care provider approves. Ask your health care provider if you may take showers. You may only be allowed to take sponge baths. Do deep breathing  exercises as told by your health care provider. These help to prevent lung infection (pneumonia). Wear compression stockings as told by your health care provider. These stockings help to prevent blood clots and reduce swelling in your legs. Keep all follow-up visits. This is important. Contact a health care provider if: You have a fever or chills. You have pain that is not controlled by your pain medicine. You have redness, swelling, or more pain at an incision site. You have a cough. An incision is warm to the touch. You have blood in your urine. You have not had a bowel movement in 3 days. You have diarrhea. Get help right away if: You have trouble breathing or feel short of breath. You have chest pain. You have severe pain. There is fluid or blood coming from an incision. There is pus or a bad smell coming from an incision. You cannot urinate. You have warmth, redness, and tenderness in your leg. These symptoms may represent a  serious problem that is an emergency. Do not wait to see if the symptoms will go away. Get medical help right away. Call your local emergency services (911 in the [place] Do not drive yourself to the hospital. Summary Follow instructions from your health care provider about how to take care of your incisions. Check your incision area every day for signs of infection. Take over-the-counter and prescription medicines only as told by your health care provider. Return to your normal activities as told by your health care provider. Ask your health care provider what activities are safe for you. Keep all follow-up visits. This is important. This information is not intended to replace advice given to you by your health care provider. Make sure you discuss any questions you have with your health care provider. Document Revised: 09/08/2019 Document Reviewed: 09/08/2019 Elsevier Patient Education  2024 [employer] General Anesthesia, Adult, Care After The following  information offers guidance on how to care for yourself after your procedure. Your health care provider may also give you more specific instructions. If you have problems or questions, contact your health care provider. What can I expect after the procedure? After the procedure, it is common for people to: Have pain or discomfort at the IV site. Have nausea or vomiting. Have a sore throat or hoarseness. Have trouble concentrating. Feel cold or chills. Feel weak, sleepy, or tired (fatigue). Have soreness and body aches. These can affect parts of the body that were not involved in surgery. Follow these instructions at home: For the time period you were told by your health care provider:  Rest. Do not participate in activities where you could fall or become injured. Do not drive or use machinery. Do not drink alcohol. Do not take sleeping pills or medicines that cause drowsiness. Do not make important decisions or sign legal documents. Do not take care of children on your own. General instructions Drink enough fluid to keep your urine pale yellow. If you have sleep apnea, surgery and certain medicines can increase your risk for breathing problems. Follow instructions from your health care provider about wearing your sleep device: Anytime you are sleeping, including during daytime naps. While taking prescription pain medicines, sleeping medicines, or medicines that make you drowsy. Return to your normal activities as told by your health care provider. Ask your health care provider what activities are safe for you. Take over-the-counter and prescription medicines only as told by your health care provider. Do not use any products that contain nicotine or tobacco. These products include cigarettes, chewing tobacco, and vaping devices, such as e-cigarettes. These can delay incision healing after surgery. If you need help quitting, ask your health care provider. Contact a health care provider  if: You have nausea or vomiting that does not get better with medicine. You vomit every time you eat or drink. You have pain that does not get better with medicine. You cannot urinate or have bloody urine. You develop a skin rash. You have a fever. Get help right away if: You have trouble breathing. You have chest pain. You vomit blood. These symptoms may be an emergency. Get help right away. Call 911. Do not wait to see if the symptoms will go away. Do not drive yourself to the hospital. Summary After the procedure, it is common to have a sore throat, hoarseness, nausea, vomiting, or to feel weak, sleepy, or fatigue. For the time period you were told by your health care provider, do not drive or use machinery. Get  help right away if you have difficulty breathing, have chest pain, or vomit blood. These symptoms may be an emergency. This information is not intended to replace advice given to you by your health care provider. Make sure you discuss any questions you have with your health care provider. Document Revised: 08/12/2021 Document Reviewed: 08/12/2021 Elsevier Patient Education  2024 [employer]. How to Use Chlorhexidine Before Surgery Chlorhexidine gluconate (CHG) is a germ-killing (antiseptic) solution that is used to clean the skin. It can get rid of the bacteria that normally live on the skin and can keep them away for about 24 hours. To clean your skin with CHG, you may be given: A CHG solution to use in the shower or as part of a sponge bath. A prepackaged cloth that contains CHG. Cleaning your skin with CHG may help lower the risk for infection: While you are staying in the intensive care unit of the hospital. If you have a vascular access, such as a central line, to provide short-term or long-term access to your veins. If you have a catheter to drain urine from your bladder. If you are on a ventilator. A ventilator is a machine that helps you breathe by moving air in and  out of your lungs. After surgery. What are the risks? Risks of using CHG include: A skin reaction. Hearing loss, if CHG gets in your ears and you have a perforated eardrum. Eye injury, if CHG gets in your eyes and is not rinsed out. The CHG product catching fire. Make sure that you avoid smoking and flames after applying CHG to your skin. Do not use CHG: If you have a chlorhexidine allergy or have previously reacted to chlorhexidine. On babies younger than 81 months of age. How to use CHG solution Use CHG only as told by your health care provider, and follow the instructions on the label. Use the full amount of CHG as directed. Usually, this is one bottle. During a shower Follow these steps when using CHG solution during a shower (unless your health care provider gives you different instructions): Start the shower. Use your normal soap and shampoo to wash your face and hair. Turn off the shower or move out of the shower stream. Pour the CHG onto a clean washcloth. Do not use any type of brush or rough-edged sponge. Starting at your neck, lather your body down to your toes. Make sure you follow these instructions: If you will be having surgery, pay special attention to the part of your body where you will be having surgery. Scrub this area for at least 1 minute. Do not use CHG on your head or face. If the solution gets into your ears or eyes, rinse them well with water. Avoid your genital area. Avoid any areas of skin that have broken skin, cuts, or scrapes. Scrub your back and under your arms. Make sure to wash skin folds. Let the lather sit on your skin for 1-2 minutes or as long as told by your health care provider. Thoroughly rinse your entire body in the shower. Make sure that all body creases and crevices are rinsed well. Dry off with a clean towel. Do not put any substances on your body afterward--such as powder, lotion, or perfume--unless you are told to do so by your health care  provider. Only use lotions that are recommended by the manufacturer. Put on clean clothes or pajamas. If it is the night before your surgery, sleep in clean sheets.  During a sponge  bath Follow these steps when using CHG solution during a sponge bath (unless your health care provider gives you different instructions): Use your normal soap and shampoo to wash your face and hair. Pour the CHG onto a clean washcloth. Starting at your neck, lather your body down to your toes. Make sure you follow these instructions: If you will be having surgery, pay special attention to the part of your body where you will be having surgery. Scrub this area for at least 1 minute. Do not use CHG on your head or face. If the solution gets into your ears or eyes, rinse them well with water. Avoid your genital area. Avoid any areas of skin that have broken skin, cuts, or scrapes. Scrub your back and under your arms. Make sure to wash skin folds. Let the lather sit on your skin for 1-2 minutes or as long as told by your health care provider. Using a different clean, wet washcloth, thoroughly rinse your entire body. Make sure that all body creases and crevices are rinsed well. Dry off with a clean towel. Do not put any substances on your body afterward--such as powder, lotion, or perfume--unless you are told to do so by your health care provider. Only use lotions that are recommended by the manufacturer. Put on clean clothes or pajamas. If it is the night before your surgery, sleep in clean sheets. How to use CHG prepackaged cloths Only use CHG cloths as told by your health care provider, and follow the instructions on the label. Use the CHG cloth on clean, dry skin. Do not use the CHG cloth on your head or face unless your health care provider tells you to. When washing with the CHG cloth: Avoid your genital area. Avoid any areas of skin that have broken skin, cuts, or scrapes. Before surgery Follow these steps  when using a CHG cloth to clean before surgery (unless your health care provider gives you different instructions): Using the CHG cloth, vigorously scrub the part of your body where you will be having surgery. Scrub using a back-and-forth motion for 3 minutes. The area on your body should be completely wet with CHG when you are done scrubbing. Do not rinse. Discard the cloth and let the area air-dry. Do not put any substances on the area afterward, such as powder, lotion, or perfume. Put on clean clothes or pajamas. If it is the night before your surgery, sleep in clean sheets.  For general bathing Follow these steps when using CHG cloths for general bathing (unless your health care provider gives you different instructions). Use a separate CHG cloth for each area of your body. Make sure you wash between any folds of skin and between your fingers and toes. Wash your body in the following order, switching to a new cloth after each step: The front of your neck, shoulders, and chest. Both of your arms, under your arms, and your hands. Your stomach and groin area, avoiding the genitals. Your right leg and foot. Your left leg and foot. The back of your neck, your back, and your buttocks. Do not rinse. Discard the cloth and let the area air-dry. Do not put any substances on your body afterward--such as powder, lotion, or perfume--unless you are told to do so by your health care provider. Only use lotions that are recommended by the manufacturer. Put on clean clothes or pajamas. Contact a health care provider if: Your skin gets irritated after scrubbing. You have questions about using your solution  or cloth. You swallow any chlorhexidine. Call your local poison control center ([phone] in the [place] Get help right away if: Your eyes itch badly, or they become very red or swollen. Your skin itches badly and is red or swollen. Your hearing changes. You have trouble seeing. You have swelling or  tingling in your mouth or throat. You have trouble breathing. These symptoms may represent a serious problem that is an emergency. Do not wait to see if the symptoms will go away. Get medical help right away. Call your local emergency services (911 in the [place] Do not drive yourself to the hospital. Summary Chlorhexidine gluconate (CHG) is a germ-killing (antiseptic) solution that is used to clean the skin. Cleaning your skin with CHG may help to lower your risk for infection. You may be given CHG to use for bathing. It may be in a bottle or in a prepackaged cloth to use on your skin. Carefully follow your health care provider's instructions and the instructions on the product label. Do not use CHG if you have a chlorhexidine allergy. Contact your health care provider if your skin gets irritated after scrubbing. This information is not intended to replace advice given to you by your health care provider. Make sure you discuss any questions you have with your health care provider. Document Revised: 09/12/2021 Document Reviewed: 07/26/2020 Elsevier Patient Education  2023 [employer].

## 2022-12-15 ENCOUNTER — Encounter (HOSPITAL_COMMUNITY)
Admission: RE | Admit: 2022-12-15 | Discharge: 2022-12-15 | Disposition: A | Discharge status: Home / Self Care | Payer: 59 | Source: Ambulatory Visit | Attending: Urology | Admitting: Urology

## 2022-12-15 ENCOUNTER — Telehealth: Payer: Self-pay

## 2022-12-15 ENCOUNTER — Encounter (HOSPITAL_COMMUNITY): Payer: Self-pay

## 2022-12-15 VITALS — BP 175/88 | HR 79 | Temp 97.8°F | Resp 18 | Ht 64.0 in | Wt 270.1 lb

## 2022-12-15 DIAGNOSIS — E1165 Type 2 diabetes mellitus with hyperglycemia: Secondary | ICD-10-CM | POA: Insufficient documentation

## 2022-12-15 DIAGNOSIS — N2889 Other specified disorders of kidney and ureter: Secondary | ICD-10-CM

## 2022-12-15 DIAGNOSIS — Z01812 Encounter for preprocedural laboratory examination: Secondary | ICD-10-CM | POA: Diagnosis not present

## 2022-12-15 DIAGNOSIS — Z01818 Encounter for other preprocedural examination: Secondary | ICD-10-CM

## 2022-12-15 HISTORY — DX: Gastro-esophageal reflux disease without esophagitis: K21.9

## 2022-12-15 HISTORY — DX: Malignant neoplasm of unspecified kidney, except renal pelvis: C64.9

## 2022-12-15 HISTORY — DX: Other specified postprocedural states: Z98.890

## 2022-12-15 LAB — CBC WITH DIFFERENTIAL/PLATELET
Abs Immature Granulocytes: 0.02 10*3/uL (ref 0.00–0.07)
Basophils Absolute: 0 10*3/uL (ref 0.0–0.1)
Basophils Relative: 1 %
Eosinophils Absolute: 0.2 10*3/uL (ref 0.0–0.5)
Eosinophils Relative: 3 %
HCT: 38.3 % (ref 36.0–46.0)
Hemoglobin: 12.3 g/dL (ref 12.0–15.0)
Immature Granulocytes: 0 %
Lymphocytes Relative: 26 %
Lymphs Abs: 1.8 10*3/uL (ref 0.7–4.0)
MCH: 28.1 pg (ref 26.0–34.0)
MCHC: 32.1 g/dL (ref 30.0–36.0)
MCV: 87.6 fL (ref 80.0–100.0)
Monocytes Absolute: 0.6 10*3/uL (ref 0.1–1.0)
Monocytes Relative: 9 %
Neutro Abs: 4.2 10*3/uL (ref 1.7–7.7)
Neutrophils Relative %: 61 %
Platelets: 235 10*3/uL (ref 150–400)
RBC: 4.37 MIL/uL (ref 3.87–5.11)
RDW: 13.1 % (ref 11.5–15.5)
WBC: 7 10*3/uL (ref 4.0–10.5)
nRBC: 0 % (ref 0.0–0.2)

## 2022-12-15 LAB — BASIC METABOLIC PANEL
Anion gap: 9 (ref 5–15)
BUN: 16 mg/dL (ref 6–20)
CO2: 27 mmol/L (ref 22–32)
Calcium: 9.5 mg/dL (ref 8.9–10.3)
Chloride: 99 mmol/L (ref 98–111)
Creatinine, Ser: 0.84 mg/dL (ref 0.44–1.00)
GFR, Estimated: >60 mL/min (ref >60)
Glucose, Bld: 202 mg/dL — ABNORMAL HIGH (ref 70–99)
Potassium: 4 mmol/L (ref 3.5–5.1)
Sodium: 135 mmol/L (ref 135–145)

## 2022-12-15 NOTE — Telephone Encounter (Signed)
Received surgical clearance.  In patient chart.

## 2022-12-16 LAB — HEMOGLOBIN A1C
Hgb A1c MFr Bld: 8 % — ABNORMAL HIGH (ref 4.8–5.6)
Mean Plasma Glucose: 183 mg/dL

## 2022-12-17 NOTE — Telephone Encounter (Signed)
See message.

## 2022-12-18 NOTE — Telephone Encounter (Signed)
No medical clearance in patient chart, contacted pcp to have them re fax clearance asap.

## 2022-12-19 DIAGNOSIS — I10 Essential (primary) hypertension: Secondary | ICD-10-CM | POA: Diagnosis not present

## 2022-12-19 DIAGNOSIS — N2889 Other specified disorders of kidney and ureter: Secondary | ICD-10-CM | POA: Diagnosis not present

## 2022-12-19 DIAGNOSIS — E1165 Type 2 diabetes mellitus with hyperglycemia: Secondary | ICD-10-CM | POA: Diagnosis not present

## 2022-12-19 DIAGNOSIS — E7849 Other hyperlipidemia: Secondary | ICD-10-CM | POA: Diagnosis not present

## 2022-12-20 ENCOUNTER — Other Ambulatory Visit (HOSPITAL_COMMUNITY): Payer: 59

## 2022-12-20 NOTE — Telephone Encounter (Signed)
Clearance under media and routed to MD

## 2022-12-25 ENCOUNTER — Inpatient Hospital Stay (HOSPITAL_COMMUNITY): Payer: 59 | Admitting: Certified Registered Nurse Anesthetist

## 2022-12-25 ENCOUNTER — Encounter (HOSPITAL_COMMUNITY)
Admission: RE | Disposition: A | Discharge status: Home / Self Care | Payer: Self-pay | Source: Home / Self Care | Attending: Urology

## 2022-12-25 ENCOUNTER — Encounter (HOSPITAL_COMMUNITY): Payer: Self-pay | Admitting: Urology

## 2022-12-25 ENCOUNTER — Inpatient Hospital Stay (HOSPITAL_COMMUNITY)
Admission: RE | Admit: 2022-12-25 | Discharge: 2022-12-30 | DRG: 657 | Disposition: A | Discharge status: Home / Self Care | Payer: 59 | Attending: Urology | Admitting: Urology

## 2022-12-25 ENCOUNTER — Other Ambulatory Visit: Payer: Self-pay

## 2022-12-25 DIAGNOSIS — Z823 Family history of stroke: Secondary | ICD-10-CM

## 2022-12-25 DIAGNOSIS — N2889 Other specified disorders of kidney and ureter: Secondary | ICD-10-CM | POA: Diagnosis not present

## 2022-12-25 DIAGNOSIS — Z7984 Long term (current) use of oral hypoglycemic drugs: Secondary | ICD-10-CM

## 2022-12-25 DIAGNOSIS — I110 Hypertensive heart disease with heart failure: Secondary | ICD-10-CM | POA: Diagnosis present

## 2022-12-25 DIAGNOSIS — E7800 Pure hypercholesterolemia, unspecified: Secondary | ICD-10-CM | POA: Diagnosis present

## 2022-12-25 DIAGNOSIS — Z8616 Personal history of COVID-19: Secondary | ICD-10-CM

## 2022-12-25 DIAGNOSIS — I7300 Raynaud's syndrome without gangrene: Secondary | ICD-10-CM | POA: Diagnosis not present

## 2022-12-25 DIAGNOSIS — Z79899 Other long term (current) drug therapy: Secondary | ICD-10-CM

## 2022-12-25 DIAGNOSIS — G4733 Obstructive sleep apnea (adult) (pediatric): Secondary | ICD-10-CM

## 2022-12-25 DIAGNOSIS — Z8249 Family history of ischemic heart disease and other diseases of the circulatory system: Secondary | ICD-10-CM | POA: Diagnosis not present

## 2022-12-25 DIAGNOSIS — R0602 Shortness of breath: Secondary | ICD-10-CM | POA: Diagnosis not present

## 2022-12-25 DIAGNOSIS — Z6841 Body Mass Index (BMI) 40.0 and over, adult: Secondary | ICD-10-CM | POA: Diagnosis not present

## 2022-12-25 DIAGNOSIS — F419 Anxiety disorder, unspecified: Secondary | ICD-10-CM | POA: Diagnosis present

## 2022-12-25 DIAGNOSIS — I5032 Chronic diastolic (congestive) heart failure: Secondary | ICD-10-CM

## 2022-12-25 DIAGNOSIS — E1142 Type 2 diabetes mellitus with diabetic polyneuropathy: Secondary | ICD-10-CM | POA: Diagnosis not present

## 2022-12-25 DIAGNOSIS — Z79818 Long term (current) use of other agents affecting estrogen receptors and estrogen levels: Secondary | ICD-10-CM | POA: Diagnosis not present

## 2022-12-25 DIAGNOSIS — G4730 Sleep apnea, unspecified: Secondary | ICD-10-CM | POA: Diagnosis present

## 2022-12-25 DIAGNOSIS — Z820 Family history of epilepsy and other diseases of the nervous system: Secondary | ICD-10-CM

## 2022-12-25 DIAGNOSIS — K5900 Constipation, unspecified: Secondary | ICD-10-CM | POA: Diagnosis present

## 2022-12-25 DIAGNOSIS — I509 Heart failure, unspecified: Secondary | ICD-10-CM | POA: Diagnosis not present

## 2022-12-25 DIAGNOSIS — Z809 Family history of malignant neoplasm, unspecified: Secondary | ICD-10-CM

## 2022-12-25 DIAGNOSIS — Z800 Family history of malignant neoplasm of digestive organs: Secondary | ICD-10-CM

## 2022-12-25 DIAGNOSIS — C641 Malignant neoplasm of right kidney, except renal pelvis: Secondary | ICD-10-CM | POA: Diagnosis not present

## 2022-12-25 DIAGNOSIS — Z7985 Long-term (current) use of injectable non-insulin antidiabetic drugs: Secondary | ICD-10-CM

## 2022-12-25 DIAGNOSIS — F4310 Post-traumatic stress disorder, unspecified: Secondary | ICD-10-CM | POA: Diagnosis present

## 2022-12-25 DIAGNOSIS — N179 Acute kidney failure, unspecified: Secondary | ICD-10-CM | POA: Diagnosis not present

## 2022-12-25 DIAGNOSIS — Z7722 Contact with and (suspected) exposure to environmental tobacco smoke (acute) (chronic): Secondary | ICD-10-CM

## 2022-12-25 DIAGNOSIS — J45909 Unspecified asthma, uncomplicated: Secondary | ICD-10-CM | POA: Diagnosis present

## 2022-12-25 DIAGNOSIS — F32A Depression, unspecified: Secondary | ICD-10-CM | POA: Diagnosis not present

## 2022-12-25 HISTORY — PX: ROBOT ASSISTED LAPAROSCOPIC NEPHRECTOMY: SHX5140

## 2022-12-25 LAB — BASIC METABOLIC PANEL
Anion gap: 5 (ref 5–15)
BUN: 22 mg/dL — ABNORMAL HIGH (ref 6–20)
CO2: 24 mmol/L (ref 22–32)
Calcium: 8 mg/dL — ABNORMAL LOW (ref 8.9–10.3)
Chloride: 101 mmol/L (ref 98–111)
Creatinine, Ser: 0.86 mg/dL (ref 0.44–1.00)
GFR, Estimated: >60 mL/min (ref >60)
Glucose, Bld: 299 mg/dL — ABNORMAL HIGH (ref 70–99)
Potassium: 4.6 mmol/L (ref 3.5–5.1)
Sodium: 130 mmol/L — ABNORMAL LOW (ref 135–145)

## 2022-12-25 LAB — CBC
HCT: 33.2 % — ABNORMAL LOW (ref 36.0–46.0)
Hemoglobin: 11 g/dL — ABNORMAL LOW (ref 12.0–15.0)
MCH: 29.2 pg (ref 26.0–34.0)
MCHC: 33.1 g/dL (ref 30.0–36.0)
MCV: 88.1 fL (ref 80.0–100.0)
Platelets: 243 10*3/uL (ref 150–400)
RBC: 3.77 MIL/uL — ABNORMAL LOW (ref 3.87–5.11)
RDW: 13.1 % (ref 11.5–15.5)
WBC: 16.4 10*3/uL — ABNORMAL HIGH (ref 4.0–10.5)
nRBC: 0 % (ref 0.0–0.2)

## 2022-12-25 LAB — GLUCOSE, CAPILLARY
Glucose-Capillary: 188 mg/dL — ABNORMAL HIGH (ref 70–99)
Glucose-Capillary: 304 mg/dL — ABNORMAL HIGH (ref 70–99)
Glucose-Capillary: 211 mg/dL — ABNORMAL HIGH (ref 70–99)

## 2022-12-25 LAB — BPAM RBC
Blood Product Expiration Date: 202408202359
Blood Product Expiration Date: 202408242359
Unit Type and Rh: 6200
Unit Type and Rh: 6200

## 2022-12-25 SURGERY — NEPHRECTOMY, RADICAL, ROBOT-ASSISTED, LAPAROSCOPIC, ADULT
Anesthesia: General | Site: Abdomen | Laterality: Right

## 2022-12-25 MED ORDER — HYDROXYZINE HCL 25 MG PO TABS
100.0000 mg | ORAL_TABLET | Freq: Every day | ORAL | Status: DC
  Administered 2022-12-25 – 2022-12-29 (×3): 100 mg via ORAL
  Filled 2022-12-25 (×4): qty 4

## 2022-12-25 MED ORDER — AMLODIPINE BESYLATE 5 MG PO TABS
5.0000 mg | ORAL_TABLET | Freq: Every day | ORAL | Status: DC
  Administered 2022-12-26 – 2022-12-27 (×2): 5 mg via ORAL
  Filled 2022-12-25 (×3): qty 1

## 2022-12-25 MED ORDER — AZELASTINE-FLUTICASONE 137-50 MCG/ACT NA SUSP: 1.0000 | Freq: Two times a day (BID) | NASAL | Status: DC | PRN

## 2022-12-25 MED ORDER — OXYCODONE HCL 5 MG/5ML PO SOLN: 5.0000 mg | Freq: Once | ORAL | Status: DC | PRN

## 2022-12-25 MED ORDER — SENNOSIDES-DOCUSATE SODIUM 8.6-50 MG PO TABS
2.0000 | ORAL_TABLET | Freq: Every day | ORAL | Status: DC
  Administered 2022-12-25 – 2022-12-29 (×5): 2 via ORAL
  Filled 2022-12-25 (×5): qty 2

## 2022-12-25 MED ORDER — ONDANSETRON HCL 4 MG/2ML IJ SOLN
INTRAMUSCULAR | Status: DC | PRN
  Administered 2022-12-25: 8 mg via INTRAVENOUS

## 2022-12-25 MED ORDER — DEXAMETHASONE SODIUM PHOSPHATE 10 MG/ML IJ SOLN
INTRAMUSCULAR | Status: DC | PRN
  Administered 2022-12-25: 10 mg via INTRAVENOUS

## 2022-12-25 MED ORDER — BUPIVACAINE HCL (PF) 0.5 % IJ SOLN
INTRAMUSCULAR | Status: AC
  Filled 2022-12-25: qty 30

## 2022-12-25 MED ORDER — HYDROMORPHONE HCL 1 MG/ML IJ SOLN
0.5000 mg | INTRAMUSCULAR | Status: DC | PRN
  Administered 2022-12-25 – 2022-12-27 (×6): 1 mg via INTRAVENOUS
  Filled 2022-12-25 (×6): qty 1

## 2022-12-25 MED ORDER — CEFAZOLIN SODIUM-DEXTROSE 2-4 GM/100ML-% IV SOLN
2.0000 g | Freq: Three times a day (TID) | INTRAVENOUS | Status: AC
  Administered 2022-12-25 – 2022-12-26 (×2): 2 g via INTRAVENOUS
  Filled 2022-12-25 (×2): qty 100

## 2022-12-25 MED ORDER — IRBESARTAN 150 MG PO TABS
300.0000 mg | ORAL_TABLET | Freq: Every day | ORAL | Status: DC
  Administered 2022-12-26: 150 mg via ORAL
  Administered 2022-12-27 – 2022-12-28 (×2): 300 mg via ORAL
  Filled 2022-12-25 (×3): qty 2

## 2022-12-25 MED ORDER — PHENYLEPHRINE HCL-NACL 20-0.9 MG/250ML-% IV SOLN
INTRAVENOUS | Status: AC
  Filled 2022-12-25: qty 250

## 2022-12-25 MED ORDER — PROPOFOL 10 MG/ML IV BOLUS
INTRAVENOUS | Status: DC | PRN
Start: 2022-12-25 — End: 2022-12-25
  Administered 2022-12-25: 230 mg via INTRAVENOUS

## 2022-12-25 MED ORDER — ROCURONIUM BROMIDE 10 MG/ML (PF) SYRINGE
PREFILLED_SYRINGE | INTRAVENOUS | Status: DC | PRN
  Administered 2022-12-25: 60 mg via INTRAVENOUS
  Administered 2022-12-25: 40 mg via INTRAVENOUS
  Administered 2022-12-25: 50 mg via INTRAVENOUS

## 2022-12-25 MED ORDER — PHENYLEPHRINE 80 MCG/ML (10ML) SYRINGE FOR IV PUSH (FOR BLOOD PRESSURE SUPPORT)
PREFILLED_SYRINGE | INTRAVENOUS | Status: DC | PRN
Start: 2022-12-25 — End: 2022-12-25
  Administered 2022-12-25: 1760 ug via INTRAVENOUS
  Administered 2022-12-25: 240 ug via INTRAVENOUS
  Administered 2022-12-25: 160 ug via INTRAVENOUS

## 2022-12-25 MED ORDER — ISOSORBIDE MONONITRATE ER 30 MG PO TB24
30.0000 mg | ORAL_TABLET | Freq: Every day | ORAL | Status: DC
  Administered 2022-12-26 – 2022-12-30 (×5): 30 mg via ORAL
  Filled 2022-12-25 (×5): qty 1

## 2022-12-25 MED ORDER — MIDAZOLAM HCL 2 MG/2ML IJ SOLN
INTRAMUSCULAR | Status: AC
  Filled 2022-12-25: qty 2

## 2022-12-25 MED ORDER — SODIUM CHLORIDE 0.9 % IR SOLN
Status: DC | PRN
  Administered 2022-12-25: 3000 mL

## 2022-12-25 MED ORDER — FENTANYL CITRATE (PF) 100 MCG/2ML IJ SOLN
INTRAMUSCULAR | Status: AC
  Filled 2022-12-25: qty 2

## 2022-12-25 MED ORDER — PHENYLEPHRINE HCL-NACL 20-0.9 MG/250ML-% IV SOLN
INTRAVENOUS | Status: DC | PRN
  Administered 2022-12-25: 30 ug/min via INTRAVENOUS

## 2022-12-25 MED ORDER — HEMOSTATIC AGENTS (NO CHARGE) OPTIME
TOPICAL | Status: DC | PRN
  Administered 2022-12-25 (×2): 1

## 2022-12-25 MED ORDER — ALBUMIN HUMAN 5 % IV SOLN: INTRAVENOUS | Status: DC | PRN

## 2022-12-25 MED ORDER — LIDOCAINE HCL (CARDIAC) PF 100 MG/5ML IV SOSY: PREFILLED_SYRINGE | INTRAVENOUS | Status: DC | PRN

## 2022-12-25 MED ORDER — ROCURONIUM BROMIDE 10 MG/ML (PF) SYRINGE
PREFILLED_SYRINGE | INTRAVENOUS | Status: AC
  Filled 2022-12-25: qty 10

## 2022-12-25 MED ORDER — INSULIN ASPART 100 UNIT/ML IJ SOLN
0.0000 [IU] | Freq: Every day | INTRAMUSCULAR | Status: DC
  Administered 2022-12-25 – 2022-12-27 (×2): 2 [IU] via SUBCUTANEOUS

## 2022-12-25 MED ORDER — SUGAMMADEX SODIUM 200 MG/2ML IV SOLN
INTRAVENOUS | Status: DC | PRN
  Administered 2022-12-25: 300 mg via INTRAVENOUS

## 2022-12-25 MED ORDER — LIDOCAINE HCL (CARDIAC) PF 100 MG/5ML IV SOSY
PREFILLED_SYRINGE | INTRAVENOUS | Status: DC | PRN
  Administered 2022-12-25: 80 mg via INTRAVENOUS

## 2022-12-25 MED ORDER — LACTATED RINGERS IV SOLN: INTRAVENOUS | Status: DC | PRN

## 2022-12-25 MED ORDER — LACTATED RINGERS IV SOLN: INTRAVENOUS | Status: DC

## 2022-12-25 MED ORDER — SODIUM CHLORIDE 0.9 % IV SOLN: INTRAVENOUS | Status: DC

## 2022-12-25 MED ORDER — OXYCODONE HCL 5 MG PO TABS: 5.0000 mg | ORAL_TABLET | Freq: Once | ORAL | Status: DC | PRN

## 2022-12-25 MED ORDER — FENTANYL CITRATE (PF) 100 MCG/2ML IJ SOLN
INTRAMUSCULAR | Status: DC | PRN
  Administered 2022-12-25 (×4): 50 ug via INTRAVENOUS

## 2022-12-25 MED ORDER — SERTRALINE HCL 50 MG PO TABS
200.0000 mg | ORAL_TABLET | Freq: Every day | ORAL | Status: DC
  Administered 2022-12-25 – 2022-12-30 (×6): 200 mg via ORAL
  Filled 2022-12-25 (×6): qty 4

## 2022-12-25 MED ORDER — METOPROLOL TARTRATE 50 MG PO TABS
100.0000 mg | ORAL_TABLET | Freq: Two times a day (BID) | ORAL | Status: DC
  Administered 2022-12-25 – 2022-12-30 (×6): 100 mg via ORAL
  Filled 2022-12-25 (×9): qty 2

## 2022-12-25 MED ORDER — DIPHENHYDRAMINE HCL 50 MG/ML IJ SOLN: 12.5000 mg | Freq: Four times a day (QID) | INTRAMUSCULAR | Status: DC | PRN

## 2022-12-25 MED ORDER — ONDANSETRON HCL 4 MG/2ML IJ SOLN
INTRAMUSCULAR | Status: AC
  Filled 2022-12-25: qty 4

## 2022-12-25 MED ORDER — OXYCODONE HCL 5 MG PO TABS
5.0000 mg | ORAL_TABLET | ORAL | Status: DC | PRN
  Administered 2022-12-26 – 2022-12-30 (×3): 5 mg via ORAL
  Filled 2022-12-25 (×3): qty 1

## 2022-12-25 MED ORDER — ORAL CARE MOUTH RINSE: 15.0000 mL | Freq: Once | OROMUCOSAL | Status: DC

## 2022-12-25 MED ORDER — ALBUMIN HUMAN 5 % IV SOLN
INTRAVENOUS | Status: AC
  Filled 2022-12-25: qty 250

## 2022-12-25 MED ORDER — CHLORHEXIDINE GLUCONATE 0.12 % MT SOLN
OROMUCOSAL | Status: AC
  Filled 2022-12-25: qty 15

## 2022-12-25 MED ORDER — DEXAMETHASONE SODIUM PHOSPHATE 10 MG/ML IJ SOLN
INTRAMUSCULAR | Status: AC
  Filled 2022-12-25: qty 1

## 2022-12-25 MED ORDER — ONDANSETRON HCL 4 MG/2ML IJ SOLN
4.0000 mg | INTRAMUSCULAR | Status: DC | PRN
  Administered 2022-12-25: 4 mg via INTRAVENOUS
  Filled 2022-12-25: qty 2

## 2022-12-25 MED ORDER — FENTANYL CITRATE PF 50 MCG/ML IJ SOSY: 25.0000 ug | PREFILLED_SYRINGE | INTRAMUSCULAR | Status: DC | PRN

## 2022-12-25 MED ORDER — DILTIAZEM HCL 60 MG PO TABS
90.0000 mg | ORAL_TABLET | Freq: Every day | ORAL | Status: DC
  Administered 2022-12-26: 30 mg via ORAL
  Administered 2022-12-27: 90 mg via ORAL
  Filled 2022-12-25 (×3): qty 1

## 2022-12-25 MED ORDER — ACETAMINOPHEN 325 MG PO TABS
650.0000 mg | ORAL_TABLET | ORAL | Status: DC | PRN
  Administered 2022-12-28: 650 mg via ORAL
  Filled 2022-12-25: qty 2

## 2022-12-25 MED ORDER — DIPHENHYDRAMINE HCL 12.5 MG/5ML PO ELIX: 12.5000 mg | ORAL_SOLUTION | Freq: Four times a day (QID) | ORAL | Status: DC | PRN

## 2022-12-25 MED ORDER — CHLORHEXIDINE GLUCONATE 0.12 % MT SOLN: 15.0000 mL | Freq: Once | OROMUCOSAL | Status: DC

## 2022-12-25 MED ORDER — CEFAZOLIN SODIUM-DEXTROSE 2-4 GM/100ML-% IV SOLN
2.0000 g | INTRAVENOUS | Status: DC
  Filled 2022-12-25: qty 100

## 2022-12-25 MED ORDER — ONDANSETRON HCL 4 MG/2ML IJ SOLN: 4.0000 mg | Freq: Once | INTRAMUSCULAR | Status: DC | PRN

## 2022-12-25 MED ORDER — SCOPOLAMINE 1 MG/3DAYS TD PT72: 1.0000 | MEDICATED_PATCH | Freq: Once | TRANSDERMAL | Status: DC

## 2022-12-25 MED ORDER — STERILE WATER FOR IRRIGATION IR SOLN
Status: DC | PRN
  Administered 2022-12-25: 500 mL

## 2022-12-25 MED ORDER — BUPROPION HCL ER (XL) 150 MG PO TB24
150.0000 mg | ORAL_TABLET | Freq: Every morning | ORAL | Status: DC
  Administered 2022-12-26 – 2022-12-30 (×5): 150 mg via ORAL
  Filled 2022-12-25 (×5): qty 1

## 2022-12-25 MED ORDER — ROSUVASTATIN CALCIUM 20 MG PO TABS
40.0000 mg | ORAL_TABLET | Freq: Every day | ORAL | Status: DC
  Administered 2022-12-25 – 2022-12-30 (×6): 40 mg via ORAL
  Filled 2022-12-25: qty 2
  Filled 2022-12-25: qty 4
  Filled 2022-12-25 (×4): qty 2

## 2022-12-25 MED ORDER — ALBUTEROL SULFATE (2.5 MG/3ML) 0.083% IN NEBU: 2.5000 mg | INHALATION_SOLUTION | Freq: Four times a day (QID) | RESPIRATORY_TRACT | Status: DC | PRN

## 2022-12-25 MED ORDER — PROPOFOL 10 MG/ML IV BOLUS
INTRAVENOUS | Status: AC
  Filled 2022-12-25: qty 20

## 2022-12-25 MED ORDER — BUSPIRONE HCL 5 MG PO TABS
10.0000 mg | ORAL_TABLET | Freq: Two times a day (BID) | ORAL | Status: DC
  Administered 2022-12-25 – 2022-12-30 (×10): 10 mg via ORAL
  Filled 2022-12-25 (×10): qty 2

## 2022-12-25 MED ORDER — BUPIVACAINE HCL (PF) 0.5 % IJ SOLN
INTRAMUSCULAR | Status: DC | PRN
  Administered 2022-12-25: 30 mL

## 2022-12-25 MED ORDER — HYDROMORPHONE HCL 1 MG/ML IJ SOLN
INTRAMUSCULAR | Status: DC | PRN
  Administered 2022-12-25 (×2): .5 mg via INTRAVENOUS

## 2022-12-25 MED ORDER — ESTRADIOL 0.5 MG PO TABS
0.5000 mg | ORAL_TABLET | Freq: Every day | ORAL | Status: DC
  Administered 2022-12-25 – 2022-12-30 (×6): 0.5 mg via ORAL
  Filled 2022-12-25 (×8): qty 1

## 2022-12-25 MED ORDER — INSULIN ASPART 100 UNIT/ML IJ SOLN
0.0000 [IU] | Freq: Three times a day (TID) | INTRAMUSCULAR | Status: DC
  Administered 2022-12-25: 11 [IU] via SUBCUTANEOUS
  Administered 2022-12-26 – 2022-12-27 (×4): 3 [IU] via SUBCUTANEOUS
  Administered 2022-12-27 – 2022-12-28 (×2): 2 [IU] via SUBCUTANEOUS
  Administered 2022-12-28: 3 [IU] via SUBCUTANEOUS
  Administered 2022-12-28: 2 [IU] via SUBCUTANEOUS
  Administered 2022-12-29: 3 [IU] via SUBCUTANEOUS
  Administered 2022-12-29 – 2022-12-30 (×3): 2 [IU] via SUBCUTANEOUS

## 2022-12-25 MED ORDER — LIDOCAINE HCL (PF) 2 % IJ SOLN
INTRAMUSCULAR | Status: AC
  Filled 2022-12-25: qty 5

## 2022-12-25 MED ORDER — HYDROMORPHONE HCL 1 MG/ML IJ SOLN
INTRAMUSCULAR | Status: AC
  Filled 2022-12-25: qty 1

## 2022-12-25 MED ORDER — LAMOTRIGINE 100 MG PO TABS
200.0000 mg | ORAL_TABLET | Freq: Every day | ORAL | Status: DC
  Administered 2022-12-26: 100 mg via ORAL
  Administered 2022-12-27 – 2022-12-30 (×4): 200 mg via ORAL
  Filled 2022-12-25 (×5): qty 2

## 2022-12-25 MED ORDER — MIDAZOLAM HCL 2 MG/2ML IJ SOLN
INTRAMUSCULAR | Status: DC | PRN
  Administered 2022-12-25: 2 mg via INTRAVENOUS

## 2022-12-25 MED ORDER — BREXPIPRAZOLE 1 MG PO TABS
2.0000 mg | ORAL_TABLET | Freq: Every day | ORAL | Status: DC
  Administered 2022-12-26 – 2022-12-30 (×5): 2 mg via ORAL
  Filled 2022-12-25: qty 2
  Filled 2022-12-25: qty 1
  Filled 2022-12-25 (×3): qty 2
  Filled 2022-12-25: qty 1
  Filled 2022-12-25: qty 2

## 2022-12-25 MED ORDER — ZOLPIDEM TARTRATE 5 MG PO TABS: 5.0000 mg | ORAL_TABLET | Freq: Every evening | ORAL | Status: DC | PRN

## 2022-12-25 MED ORDER — PHENYLEPHRINE 80 MCG/ML (10ML) SYRINGE FOR IV PUSH (FOR BLOOD PRESSURE SUPPORT)
PREFILLED_SYRINGE | INTRAVENOUS | Status: AC
  Filled 2022-12-25: qty 20

## 2022-12-25 MED ORDER — GABAPENTIN 300 MG PO CAPS
300.0000 mg | ORAL_CAPSULE | Freq: Three times a day (TID) | ORAL | Status: DC
  Administered 2022-12-25 – 2022-12-30 (×15): 300 mg via ORAL
  Filled 2022-12-25 (×10): qty 1
  Filled 2022-12-25: qty 3
  Filled 2022-12-25 (×4): qty 1

## 2022-12-25 SURGICAL SUPPLY — 66 items
APL ESCP 73.6OZ SRGCL (TIP) ×1
APL PRP STRL LF DISP 70% ISPRP (MISCELLANEOUS) ×1
BAG LAPAROSCOPIC 12 15 PORT 16 (BASKET) ×2 IMPLANT
BAG RETRIEVAL 12/15 (BASKET) ×1
BLADE SURG 15 STRL LF DISP TIS (BLADE) ×2 IMPLANT
BLADE SURG 15 STRL SS (BLADE) ×1
BULB RESERV EVAC DRAIN JP 100C (MISCELLANEOUS) IMPLANT
CANNULA REDUCER 12-8 DVNC XI (CANNULA) ×2 IMPLANT
CHLORAPREP W/TINT 26 (MISCELLANEOUS) ×2 IMPLANT
CLIP LIGATING HEM O LOK PURPLE (MISCELLANEOUS) ×4 IMPLANT
COVER LIGHT HANDLE STERIS (MISCELLANEOUS) ×2 IMPLANT
COVER MAYO STAND XLG (MISCELLANEOUS) ×2 IMPLANT
COVER TIP SHEARS 8 DVNC (MISCELLANEOUS) ×2 IMPLANT
DRAIN CHANNEL RND F F (WOUND CARE) IMPLANT
DRAPE ARM DVNC X/XI (DISPOSABLE) ×8 IMPLANT
DRAPE COLUMN DVNC XI (DISPOSABLE) ×2 IMPLANT
DRAPE HALF SHEET 40X57 (DRAPES) ×2 IMPLANT
DRAPE INCISE IOBAN 66X45 STRL (DRAPES) ×2 IMPLANT
DRSG TEGADERM 2-3/8X2-3/4 SM (GAUZE/BANDAGES/DRESSINGS) IMPLANT
DRSG TEGADERM 4X4.75 (GAUZE/BANDAGES/DRESSINGS) IMPLANT
ELECT PENCIL ROCKER SW 15FT (MISCELLANEOUS) ×2 IMPLANT
ELECT REM PT RETURN 15FT ADLT (MISCELLANEOUS) ×2 IMPLANT
FORCEPS BPLR FENES DVNC XI (FORCEP) ×2 IMPLANT
FORCEPS PROGRASP DVNC XI (FORCEP) ×2 IMPLANT
GAUZE SPONGE 4X4 12PLY STRL (GAUZE/BANDAGES/DRESSINGS) IMPLANT
GLOVE BIO SURGEON STRL SZ8 (GLOVE) ×4 IMPLANT
GLOVE BIOGEL PI IND STRL 7.0 (GLOVE) ×4 IMPLANT
GLOVE BIOGEL PI IND STRL 8 (GLOVE) ×4 IMPLANT
GLOVE ECLIPSE 6.5 STRL STRAW (GLOVE) IMPLANT
GOWN STRL REUS W/TWL LRG LVL3 (GOWN DISPOSABLE) ×4 IMPLANT
GOWN STRL REUS W/TWL XL LVL3 (GOWN DISPOSABLE) ×4 IMPLANT
HEMOSTAT SURGICEL 4X8 (HEMOSTASIS) IMPLANT
IRRIG SUCT STRYKERFLOW 2 WTIP (MISCELLANEOUS) ×1
IRRIGATION SUCT STRKRFLW 2 WTP (MISCELLANEOUS) ×2 IMPLANT
IV NS IRRIG 3000ML ARTHROMATIC (IV SOLUTION) IMPLANT
KIT TURNOVER KIT A (KITS) IMPLANT
NDL HYPO 21X1.5 SAFETY (NEEDLE) ×2 IMPLANT
NDL INSUFFLATION 14GA 120MM (NEEDLE) ×2 IMPLANT
NEEDLE HYPO 21X1.5 SAFETY (NEEDLE) ×1 IMPLANT
NEEDLE INSUFFLATION 14GA 120MM (NEEDLE) ×1 IMPLANT
PACK LAP CHOLE LZT030E (CUSTOM PROCEDURE TRAY) ×2 IMPLANT
PAD ARMBOARD 7.5X6 YLW CONV (MISCELLANEOUS) ×6 IMPLANT
POSITIONER HEAD 8X9X4 ADT (SOFTGOODS) ×2 IMPLANT
POWDER SURGICEL 3.0 GRAM (HEMOSTASIS) IMPLANT
RELOAD STAPLE 45 2.5 WHT DVNC (STAPLE) IMPLANT
RELOAD STAPLER 2.5X45 WHT DVNC (STAPLE) ×3 IMPLANT
SCISSORS MNPLR CVD DVNC XI (INSTRUMENTS) ×2 IMPLANT
SEAL CANN UNIV 5-8 DVNC XI (MISCELLANEOUS) ×6 IMPLANT
SET BASIN LINEN APH (SET/KITS/TRAYS/PACK) ×2 IMPLANT
SET TUBE DA VINCI INSUFFLATOR (TUBING) IMPLANT
SET TUBE SMOKE EVAC HIGH FLOW (TUBING) ×2 IMPLANT
SLEEVE Z-THREAD 5X100MM (TROCAR) ×2 IMPLANT
SPONGE DRAIN TRACH 4X4 STRL 2S (GAUZE/BANDAGES/DRESSINGS) IMPLANT
STAPLER 45 SUREFORM DVNC (STAPLE) ×2 IMPLANT
STAPLER RELOAD 2.5X45 WHT DVNC (STAPLE) ×3
STAPLER VISISTAT (STAPLE) IMPLANT
SUT ETHILON 3 0 PS 1 (SUTURE) IMPLANT
SUT MNCRL AB 4-0 PS2 18 (SUTURE) IMPLANT
SUT PDS AB 0 CTX 60 (SUTURE) ×2 IMPLANT
SUT VIC AB 2-0 CT1 27 (SUTURE) ×1
SUT VIC AB 2-0 CT1 TAPERPNT 27 (SUTURE) IMPLANT
SYR 20ML LL LF (SYRINGE) ×4 IMPLANT
TIP ENDOSCOPIC SURGICEL (TIP) IMPLANT
TRAY FOLEY MTR SLVR 16FR STAT (SET/KITS/TRAYS/PACK) ×2 IMPLANT
TROCAR Z-THREAD FIOS 5X100MM (TROCAR) ×2 IMPLANT
WATER STERILE IRR 500ML POUR (IV SOLUTION) IMPLANT

## 2022-12-25 NOTE — Transfer of Care (Signed)
Immediate Anesthesia Transfer of Care Note  Patient: Christina Cameron  Procedure(s) Performed: XI ROBOTIC ASSISTED LAPAROSCOPIC NEPHRECTOMY (Right: Abdomen)  Patient Location: PACU  Anesthesia Type:General  Level of Consciousness: awake, drowsy, and patient cooperative  Airway & Oxygen Therapy: Patient Spontanous Breathing and Patient connected to face mask oxygen  Post-op Assessment: Report given to RN and Post -op Vital signs reviewed and stable  Post vital signs: Reviewed and stable  Last Vitals:  Vitals Value Taken Time  BP 125/64 12/25/22 1203  Temp 36.7 C 12/25/22 1203  Pulse 66 12/25/22 1207  Resp 15 12/25/22 1207  SpO2 95 % 12/25/22 1207  Vitals shown include unfiled device data.  Last Pain:  Vitals:   12/25/22 0640  TempSrc: Oral  PainSc: 0-No pain         Complications: No notable events documented.

## 2022-12-25 NOTE — Op Note (Addendum)
Preoperative diagnosis: Right renal mass  Postop diagnosis: Same  Procedure: 1.  right robot assisted laparoscopic radical nephrectomy   Attending: Wilkie Aye, MD  Assistant: Leda Roys  Anesthesia: General  Estimated blood loss: 780 cc  Drains: 16 French Foley catheter, JP drain  Specimens: right radical nephrectomy and renal vein with tumor thrombus  Antibiotics: ancef  Findings: 1 artery and 2 veins  Indications: Patient is a 57 year old with a history of 10 cm right renal mass.  The mass was not amenable to partial nephrectomy.  After discussing treatment options patient decided to proceed with right robot assisted laparoscopic radical nephrectomy.  Procedure in detail: Prior to procedure consent was obtained. Patient was brought to the operating room and briefing was done sure correct patient, correct procedure, correct site.  General anesthesia was in administered patient was placed in the left lateral decubitus position.  a 16 French catheter was placed. their abdomen and flank was then prepped and draped usual sterile fashion.  A Veress needle was used to obtain pneumoperitoneum.  Once pneumoperitoneum was reestablished to 15 mmHg we then placed a 12 mm camera port lateral to the umbilicus at the lateral edge of rectus.  We then proceeded to place 3 more robotic ports. We then placed an assistant port inferior to the camera in the midline. We then placed a 5mm port for the liver retractor in the midline above the assistant port. We then docked the robot.  We then dissected along the white line of Toldt.  We then reflected the colon medially.  We then proceeded to kocherize the duodenum. We then identified the psoas muscle.  Once this was done we traced it down to the iliac vessels and identified the ureter.  Once we identified the gonadal vein and ureter were then traced this to the renal hilum.  The renal vein and renal artery were skeletonized.  We did we identified one renal  vein one renal artery.  Using hemolock clips we ligated the renal artery.  Once this was done we then used a robotic staple load to ligate the renal veins.  We then used a hem-o-lock clips to ligate the gonadal vein and the ureter.  Once this was done we then freed the kidney from its lateral and posterior attachments.  We then used a Endo Catch bag to remove the specimen.  Once the specimen was in the Endo Catch bag we then inspected the retroperitoneum and noted no residual bleeding.  We then removed our instruments, undocked the robot, and released the pneumoperitoneum.  We then made a right lower quardant incision to remove the specimen.  Once the specimen was removed we then closed the camera and assistant ports with 0 Vicryl in interrupted fashion.  We then closed the  Right lower quadrant incision with 0 PDS in a running fashion.  We then closed the overlying skin with 2-0 Vicryl in running fashion.  The skin was then closed with staples.  This then concluded the procedure which was well tolerated by the patient.  Complications: None  Condition: Stable, x-rayed, transferred to PACU.  Plan: Patient is to be admitted for inpatient stay. The foley catheter will be removed in the morning. They will be started on a clear liquid diet POD#1

## 2022-12-25 NOTE — Anesthesia Procedure Notes (Signed)
Procedure Name: Intubation Date/Time: 12/25/2022 7:54 AM  Performed by: Oletha Cruel, CRNAPre-anesthesia Checklist: Patient identified, Emergency Drugs available, Suction available, Patient being monitored and Timeout performed Patient Re-evaluated:Patient Re-evaluated prior to induction Oxygen Delivery Method: Circle system utilized Preoxygenation: Pre-oxygenation with 100% oxygen Induction Type: IV induction Ventilation: Mask ventilation without difficulty Laryngoscope Size: Mac and 4 Grade View: Grade III Tube type: Oral Tube size: 7.5 mm Number of attempts: 1 Airway Equipment and Method: Stylet Secured at: 22 cm Tube secured with: Tape Dental Injury: Teeth and Oropharynx as per pre-operative assessment

## 2022-12-25 NOTE — H&P (Signed)
HPI: [name] is a 56yo here for right radical nephrectomy. MRI shows a large right renal mass with renal vein involvement but no IVC involvement. She has small retroperitoneal lymph nodes     PMH:     Past Medical History:  Diagnosis Date   Arm vein blood clot, unspecified laterality      around 20 years ago as of 08/10/21, Patient doesn't remember what caused it. She was not put on blood thinners.   Arthritis      right knee   Asthma     Cancer (HCC)      cervical 1989   Chest pain      a. normal cors by cath in 11/2014 / 03/02/2020 nuclear stress test demonstrated no perfusion defects consistent with prior infart or current ischemia. Normal study.   Chronic diastolic (congestive) heart failure (HCC)      09/27/20 Echocardiogram in Epic showed LVEF 60 to 65%.   Complication of anesthesia      post-operative nausea & vomiting   COVID-19      05/29/19 & 05/2020 Covid infections   Depression     Diabetes mellitus, type 2 (HCC)     Dysrhythmia      heart palpitations   H/O cardiovascular stress test 03/02/2020    03/02/2020 Nuclear stress test in Epic showed no perusion defects consistent with prior infarct or ischemia - normal study.   Headache      migraines   High cholesterol      pt takes Crestor   Hypertension      Last cardiology office visit, 10/22/20 with Rennis Harding, NP ( see note in Epic) as of 08/09/21.   Morbid (severe) obesity due to excess calories (HCC)     Neuromuscular disorder (HCC)      severe neuropathy in feet   Osteoarthritis of knee, unspecified     Palpitations     PONV (postoperative nausea and vomiting)     Post-COVID syndrome 05/2019    Pt experienced chest tightness, sob, palpitations & dizziness after Covid infection. See 04/2020 note from Rennis Harding, NP in [place].   PTSD (post-traumatic stress disorder)     Raynaud's syndrome     Sleep apnea 09/24/2020    sleep study indicated moderate sleep apea   SVT (supraventricular tachycardia)      a. s/p  ablation by Dr. Ladona Ridgel in 2006.   Wears glasses      prescripton reading glasses          Surgical History:      Past Surgical History:  Procedure Laterality Date   CARDIAC CATHETERIZATION N/A 12/14/2014    Procedure: Right/Left Heart Cath and Coronary Angiography;  Surgeon: Tonny Bollman, MD;  Location: [facility] INVASIVE CV LAB;  Service: Cardiovascular;  Laterality: N/A;   CARDIAC ELECTROPHYSIOLOGY STUDY AND ABLATION        around 2006, Patient states that heart rate was up to 180 bpm.   CESAREAN SECTION        x2 1990, 2005   CYSTOSCOPY   08/17/2021    Procedure: CYSTOSCOPY;  Surgeon: Carrington Clamp, MD;  Location: [facility];  Service: Gynecology;;   ENDOMETRIAL ABLATION       HYSTEROSCOPY WITH D & C N/A 03/11/2021    Procedure: DILATATION AND CURETTAGE /HYSTEROSCOPY;  Surgeon: Marlow Baars, MD;  Location: [facility] OR;  Service: Gynecology;  Laterality: N/A;   OPERATIVE ULTRASOUND N/A 03/11/2021    Procedure: OPERATIVE ULTRASOUND;  Surgeon: Marlow Baars,  MD;  Location: [facility] OR;  Service: Gynecology;  Laterality: N/A;  ultrasound guidance needed   ROBOTIC ASSISTED LAPAROSCOPIC HYSTERECTOMY AND SALPINGECTOMY Bilateral 08/17/2021    Procedure: XI ROBOTIC ASSISTED LAPAROSCOPIC HYSTERECTOMY AND  BILATERAL SALPINGECTOMY AND OOPHERETOMY;  Surgeon: Carrington Clamp, MD;  Location: [facility];  Service: Gynecology;  Laterality: Bilateral;          Home Medications:  Allergies as of 11/17/2022   No Known Allergies         Medication List           Accurate as of November 17, 2022  9:01 AM. If you have any questions, ask your nurse or doctor.              albuterol 108 (90 Base) MCG/ACT inhaler Commonly known as: VENTOLIN HFA Inhale 1-2 puffs into the lungs every 6 (six) hours as needed for wheezing or shortness of breath.    amLODipine 5 MG tablet Commonly known as: NORVASC Take 1 tablet (5 mg total) by mouth daily.    Azelastine-Fluticasone 137-50  MCG/ACT Susp Place 1 spray into the nose every 12 (twelve) hours. What changed:  when to take this reasons to take this    buPROPion 150 MG 24 hr tablet Commonly known as: WELLBUTRIN XL Take 150 mg by mouth every morning.    busPIRone 10 MG tablet Commonly known as: BUSPAR Take 10 mg by mouth 2 (two) times daily.    cetirizine 10 MG tablet Commonly known as: ZYRTEC Take by mouth.    ciclopirox 8 % solution Commonly known as: Penlac Apply topically at bedtime. Apply over nail and surrounding skin. Apply daily over previous coat. After seven (7) days, may remove with alcohol and continue cycle.    diltiazem 90 MG tablet Commonly known as: CARDIZEM Take by mouth.    estradiol 0.5 MG tablet Commonly known as: ESTRACE Take 0.5 mg by mouth daily.    ezetimibe 10 MG tablet Commonly known as: ZETIA Take 1 tablet (10 mg total) by mouth daily.    Farxiga 10 MG Tabs tablet Generic drug: dapagliflozin propanediol Take 10 mg by mouth daily.    fluticasone 110 MCG/ACT inhaler Commonly known as: FLOVENT HFA Inhale into the lungs.    furosemide 20 MG tablet Commonly known as: LASIX Take 1 tablet (20 mg total) by mouth daily.    gabapentin 300 MG capsule Commonly known as: NEURONTIN Take 1 capsule (300 mg total) by mouth 3 (three) times daily. Start at bedtime and slowly increase to 3x a day as needed to make sure no side effects like sedation.    hydrOXYzine 50 MG tablet Commonly known as: ATARAX TAKE 2 TABLETS BY MOUTH ONCE DAILY AT NIGHT    ibuprofen 600 MG tablet Commonly known as: ADVIL Take 1 tablet (600 mg total) by mouth every 8 (eight) hours as needed.    indapamide 2.5 MG tablet Commonly known as: LOZOL Take 2.5 mg by mouth daily as needed (fluid).    irbesartan 300 MG tablet Commonly known as: AVAPRO Take 300 mg by mouth daily.    isosorbide mononitrate 30 MG 24 hr tablet Commonly known as: IMDUR Take 1 tablet (30 mg total) by mouth daily.     lamoTRIgine 200 MG tablet Commonly known as: LAMICTAL Take 200 mg by mouth daily.    metFORMIN 500 MG tablet Commonly known as: GLUCOPHAGE Take 1,000 mg by mouth 2 (two) times daily with a meal.    metoprolol tartrate 50  MG tablet Commonly known as: LOPRESSOR Take 2 tablets (100 mg total) by mouth 2 (two) times daily.    mupirocin ointment 2 % Commonly known as: BACTROBAN Apply 1 Application topically 2 (two) times daily.    nitroGLYCERIN 0.4 MG SL tablet Commonly known as: NITROSTAT DISSOLVE ONE TABLET UNDER THE TONGUE EVERY 5 MINUTES AS NEEDED FOR CHEST PAIN.  DO NOT EXCEED A TOTAL OF 3 DOSES IN 15 MINUTES    omeprazole 40 MG capsule Commonly known as: PRILOSEC Take by mouth.    Ozempic (2 MG/DOSE) 8 MG/3ML Sopn Generic drug: Semaglutide (2 MG/DOSE) Inject into the skin. Take on Friday.    rosuvastatin 40 MG tablet Commonly known as: CRESTOR Take 1 tablet (40 mg total) by mouth daily.    sertraline 100 MG tablet Commonly known as: ZOLOFT Take 150 mg by mouth daily.    sulfamethoxazole-trimethoprim 800-160 MG tablet Commonly known as: BACTRIM DS Take 1 tablet by mouth 2 (two) times daily.    topiramate 25 MG tablet Commonly known as: TOPAMAX SMARTSIG:1 Tablet(s) By Mouth Every Evening    traZODone 50 MG tablet Commonly known as: DESYREL Take 50 mg by mouth at bedtime as needed for sleep.             Allergies:  Allergies  No Known Allergies     Family History:      Family History  Problem Relation Age of Onset   Cancer Mother     Cancer Father     Parkinson's disease Father     Stroke Brother     Hypertension Brother     Cancer - Colon Brother            Social History:  reports that she has never smoked. She has been exposed to tobacco smoke. She has never used smokeless tobacco. She reports current alcohol use. She reports that she does not use drugs.   ROS: All other review of systems were reviewed and are negative except what is noted  above in HPI   Physical Exam: BP 133/78   Pulse 90   Ht 5\' 4"  (1.626 m)   Wt 270 lb (122.5 kg)   BMI 46.35 kg/m   Constitutional:  Alert and oriented, No acute distress. HEENT: [place] AT, moist mucus membranes.  Trachea midline, no masses. Cardiovascular: No clubbing, cyanosis, or edema. Respiratory: Normal respiratory effort, no increased work of breathing. GI: Abdomen is soft, nontender, nondistended, no abdominal masses GU: No CVA tenderness.  Lymph: No cervical or inguinal lymphadenopathy. Skin: No rashes, bruises or suspicious lesions. Neurologic: Grossly intact, no focal deficits, moving all 4 extremities. Psychiatric: Normal mood and affect.   Laboratory Data: Recent Labs       Lab Results  Component Value Date    WBC 8.1 08/15/2021    HGB 14.8 08/15/2021    HCT 43.8 08/15/2021    MCV 89.2 08/15/2021    PLT 247 08/15/2021        Recent Labs       Lab Results  Component Value Date    CREATININE 0.82 11/08/2022        Recent Labs  No results found for: "PSA"     Recent Labs  No results found for: "TESTOSTERONE"     Recent Labs       Lab Results  Component Value Date    HGBA1C 10.3 04/16/2019        Urinalysis Labs (Brief)  Component Value Date/Time    COLORURINE YELLOW 07/07/2018 0812    APPEARANCEUR Clear 11/08/2022 1417    LABSPEC 1.020 07/07/2018 0812    PHURINE 5.5 07/07/2018 0812    GLUCOSEU Negative 11/08/2022 1417    HGBUR NEGATIVE 07/07/2018 0812    BILIRUBINUR Negative 11/08/2022 1417    KETONESUR negative 11/28/2019 1455    KETONESUR TRACE (A) 07/07/2018 0812    PROTEINUR Negative 11/08/2022 1417    PROTEINUR NEGATIVE 07/07/2018 0812    UROBILINOGEN 0.2 11/28/2019 1455    UROBILINOGEN 0.2 04/28/2014 0331    NITRITE Negative 11/08/2022 1417    NITRITE NEGATIVE 07/07/2018 0812    LEUKOCYTESUR Negative 11/08/2022 1417        Recent Labs       Lab Results  Component Value Date    LABMICR See below: 11/08/2022     WBCUA 0-5 11/08/2022    LABEPIT >10 (A) 11/08/2022    BACTERIA Moderate (A) 11/08/2022        Pertinent Imaging: MRI 11/10/2022: Images reviewed and discussed with the patient  No results found for this or any previous visit.   No results found for this or any previous visit.   No results found for this or any previous visit.   No results found for this or any previous visit.   No results found for this or any previous visit.   No valid procedures specified. No results found for this or any previous visit.   No results found for this or any previous visit.     Assessment & Plan:     1. Right renal mass We discussed the natural hx of renal masses and the 80/20 malignant/benign likelihood. We disucssed the treatment options including active surveillance. Renal ablation, partial and radical nephrectomy. After discussing the options the patient elects for robotic right radical nephrectomy. Risks/benefits/alternatives discussed

## 2022-12-25 NOTE — Anesthesia Preprocedure Evaluation (Signed)
Anesthesia Evaluation  Patient identified by MRN, date of birth, ID band Patient awake    Reviewed: Allergy & Precautions, H&P , NPO status , Patient's Chart, lab work & pertinent test results, reviewed documented beta blocker date and time   History of Anesthesia Complications (+) PONV and history of anesthetic complications  Airway Mallampati: II  TM Distance: >3 FB Neck ROM: full    Dental no notable dental hx.    Pulmonary neg pulmonary ROS, asthma , sleep apnea    Pulmonary exam normal breath sounds clear to auscultation       Cardiovascular Exercise Tolerance: Good hypertension, +CHF and + DOE  negative cardio ROS + dysrhythmias  Rhythm:regular Rate:Normal     Neuro/Psych  Headaches PSYCHIATRIC DISORDERS Anxiety Depression     Neuromuscular disease negative neurological ROS  negative psych ROS   GI/Hepatic negative GI ROS, Neg liver ROS,GERD  ,,  Endo/Other  negative endocrine ROSdiabetes    Renal/GU Renal diseasenegative Renal ROS  negative genitourinary   Musculoskeletal   Abdominal   Peds  Hematology negative hematology ROS (+)   Anesthesia Other Findings   Reproductive/Obstetrics negative OB ROS                             Anesthesia Physical Anesthesia Plan  ASA: 3  Anesthesia Plan: General and General ETT   Post-op Pain Management:    Induction:   PONV Risk Score and Plan: Ondansetron  Airway Management Planned:   Additional Equipment:   Intra-op Plan:   Post-operative Plan:   Informed Consent: I have reviewed the patients History and Physical, chart, labs and discussed the procedure including the risks, benefits and alternatives for the proposed anesthesia with the patient or authorized representative who has indicated his/her understanding and acceptance.     Dental Advisory Given  Plan Discussed with: CRNA  Anesthesia Plan Comments:         Anesthesia Quick Evaluation

## 2022-12-26 LAB — GLUCOSE, CAPILLARY
Glucose-Capillary: 196 mg/dL — ABNORMAL HIGH (ref 70–99)
Glucose-Capillary: 195 mg/dL — ABNORMAL HIGH (ref 70–99)
Glucose-Capillary: 196 mg/dL — ABNORMAL HIGH (ref 70–99)
Glucose-Capillary: 171 mg/dL — ABNORMAL HIGH (ref 70–99)

## 2022-12-26 MED ORDER — CHLORHEXIDINE GLUCONATE CLOTH 2 % EX PADS
6.0000 | MEDICATED_PAD | Freq: Every day | CUTANEOUS | Status: DC
  Administered 2022-12-26 – 2022-12-29 (×4): 6 via TOPICAL

## 2022-12-26 NOTE — Progress Notes (Signed)
1 Day Post-Op Subjective: Patient reports mild incisional pain. Negative flatus. No nausea or vomiting. Hgb 10.0  Objective: Vital signs in last 24 hours: Temp:  [97.9 F (36.6 C)-99.2 F (37.3 C)] 99 F (37.2 C) (07/30 1344) Pulse Rate:  [66-77] 68 (07/30 1344) Resp:  [20] 20 (07/30 1344) BP: (94-113)/(58-68) 94/58 (07/30 1344) SpO2:  [90 %-99 %] 95 % (07/30 1347)  Intake/Output from previous day: 07/29 0701 - 07/30 0700 In: 2500 [I.V.:2000; IV Piggyback:500] Out: 2570 [Urine:1800; Drains:10; Blood:760] Intake/Output this shift: No intake/output data recorded.  Physical Exam:  General:alert, cooperative, and appears stated age GI: soft, non tender, normal bowel sounds, no palpable masses, no organomegaly, no inguinal hernia Female genitalia: not done Extremities: extremities normal, atraumatic, no cyanosis or edema  Lab Results: Recent Labs    12/25/22 1214 12/26/22 0417  HGB 11.0* 10.0*  HCT 33.2* 31.3*   BMET Recent Labs    12/25/22 1214 12/26/22 0417  NA 130* 135  K 4.6 3.9  CL 101 102  CO2 24 26  GLUCOSE 299* 208*  BUN 22* 23*  CREATININE 0.86 0.93  CALCIUM 8.0* 8.2*   No results for input(s): "LABPT", "INR" in the last 72 hours. No results for input(s): "LABURIN" in the last 72 hours. Results for orders placed or performed in visit on 11/08/22  Microscopic Examination     Status: Abnormal   Collection Time: 11/08/22  2:17 PM   Urine  Result Value Ref Range Status   WBC, UA 0-5 0 - 5 /hpf Final   RBC, Urine 3-10 (A) 0 - 2 /hpf Final   Epithelial Cells (non renal) >10 (A) 0 - 10 /hpf Final   Bacteria, UA Moderate (A) None seen/Few Final    Studies/Results: No results found.  Assessment/Plan: POD31 right radical nephrectomy D/c foley Ambulate in halls with assistance Clear liquid diet Continue current pain control regiment   LOS: 1 day   Wilkie Aye 12/26/2022, 2:04 PM

## 2022-12-26 NOTE — TOC CM/SW Note (Signed)
Transition of Care [facility]) - Inpatient Brief Assessment   Patient Details  Name: CORLEY LUKE MRN: [id number] Date of Birth: [birth year]  [facility] CM/SW Contact:    Villa Herb, LCSWA Phone Number: 12/26/2022, 3:22 PM   Clinical Narrative: Transition of Care [facility]) has reviewed patient and no TOC needs have been identified at this time. We will continue to monitor patient advancement through interdisciplinary progression rounds. If new patient transition needs arise, please place a TOC consult.  Transition of Care Asessment: Insurance and Status: Insurance coverage has been reviewed Patient has primary care physician: Yes Home environment has been reviewed: from home Prior level of function:: independent Prior/Current Home Services: No current home services Social Determinants of Health Reivew: SDOH reviewed no interventions necessary Readmission risk has been reviewed: Yes Transition of care needs: no transition of care needs at this time

## 2022-12-26 NOTE — Progress Notes (Signed)
Oxygen removed this a.m. and patient sating mid 90's around 1400 noted vital sign check noted patient's O2 90% Oxygen 2L placed and sats back up to 96%. Noted around same time BP 90/58 pulse 67, her vitals were rechecked 3 time over a close period of time and BP rose to 104/61 and pulse 65 and O2 sats on 2l 97%. I reviewed patient's medication and messaged attending. Patient did receive pain medication within an hour of the first above mentioned blood pressure. Patient denied dizznies, shortness of breath, or chest pain during this time. Her only complaint was pain at the surgical site before pain medication was administered. JP drainage for my shift was .

## 2022-12-26 NOTE — Progress Notes (Addendum)
Patient c/o midsternal chest pain that occurs when she breathes in but is relieved when breathing out and bilateral hand swelling at around 0600 and incisional pain. Vital Signs were obtained. Remains on 3.5L [place]. Was given PRN PO pain medication and pt reported some relief. Hospitalist, Dr. Thomes Dinning was notified of pain and bilateral hand swelling and RN was told to continue monitoring.

## 2022-12-26 NOTE — Anesthesia Postprocedure Evaluation (Signed)
Anesthesia Post Note  Patient: Christina Cameron  Procedure(s) Performed: XI ROBOTIC ASSISTED LAPAROSCOPIC NEPHRECTOMY (Right: Abdomen)  Patient location during evaluation: Phase II Anesthesia Type: General Level of consciousness: awake Pain management: pain level controlled Vital Signs Assessment: post-procedure vital signs reviewed and stable Respiratory status: spontaneous breathing and respiratory function stable Cardiovascular status: blood pressure returned to baseline and stable Postop Assessment: no headache and no apparent nausea or vomiting Anesthetic complications: no Comments: Late entry   No notable events documented.   Last Vitals:  Vitals:   12/26/22 0014 12/26/22 0542  BP: 107/60 113/60  Pulse: 77 77  Resp:    Temp: 37.2 C 37.1 C  SpO2: 96% 99%    Last Pain:  Vitals:   12/26/22 0636  TempSrc:   PainSc: 0-No pain                 Windell Norfolk

## 2022-12-26 NOTE — Evaluation (Signed)
Occupational Therapy Evaluation Patient Details Name: Christina Cameron MRN: [id number] DOB: 11/12/1965 Today's Date: 12/26/2022   History of Present Illness Christina Cameron is a 56yo here for right radical nephrectomy. MRI shows a large right renal mass with renal vein involvement but no IVC involvement. She has small retroperitoneal lymph nodes (per MD)   Clinical Impression   Pt agreeable to OT evaluation. Pt demonstrated SpO2 at 94 to 96% without supplemental O2 today. Pt left off of supplemental oxygen at the end of the session. Pt is independent at baseline with need for min G assist for transfers with cane today. Pt is unsteady with mild loss of balance posteriorly today needing brief assist. Pt is requiring assist for lower body dressing and bathing due to difficulty reaching B LE seated at EOB. Pt reports having husband at home who will be available 24/7 for a short time. Pt was left in the bed with call bell within reach. Pt will benefit from continued OT in the hospital and recommended venue below to increase strength, balance, and endurance for safe ADL's.         Recommendations for follow up therapy are one component of a multi-disciplinary discharge planning process, led by the attending physician.  Recommendations may be updated based on patient status, additional functional criteria and insurance authorization.   Assistance Recommended at Discharge Intermittent Supervision/Assistance  Patient can return home with the following A little help with walking and/or transfers;A little help with bathing/dressing/bathroom;Assist for transportation    Functional Status Assessment  Patient has had a recent decline in their functional status and demonstrates the ability to make significant improvements in function in a reasonable and predictable amount of time.  Equipment Recommendations  None recommended by OT           Precautions / Restrictions Precautions Precautions:  Fall Restrictions Weight Bearing Restrictions: No      Mobility Bed Mobility Overal bed mobility: Needs Assistance Bed Mobility: Rolling, Sidelying to Sit Rolling: Supervision Sidelying to sit: Supervision, Min guard       General bed mobility comments: Labored movement; use of bed rail. Extended time.    Transfers Overall transfer level: Needs assistance Equipment used: Straight cane Transfers: Sit to/from Stand, Bed to chair/wheelchair/BSC Sit to Stand: Min guard     Step pivot transfers: Min guard     General transfer comment: Mild loss of balance when transfer to the bathroom. Mildly unsteady with some postural sway.      Balance Overall balance assessment: Needs assistance Sitting-balance support: No upper extremity supported, Feet supported Sitting balance-Leahy Scale: Good Sitting balance - Comments: seated at EOB   Standing balance support: Single extremity supported, During functional activity Standing balance-Leahy Scale: Fair Standing balance comment: Unsteady in standing at times; loss of balance posteriorly once or twice needing min G assist.                           ADL either performed or assessed with clinical judgement   ADL Overall ADL's : Needs assistance/impaired             Lower Body Bathing: Moderate assistance;Maximal assistance;Sitting/lateral leans   Upper Body Dressing : Set up;Sitting   Lower Body Dressing: Moderate assistance;Maximal assistance;Sitting/lateral leans Lower Body Dressing Details (indicate cue type and reason): Pt struggled to reach her socks seated at EOB today. Toilet Transfer: Min guard;Rolling walker (2 wheels);Ambulation Toilet Transfer Details (indicate cue type and reason): EOB to  toilet and back. Toileting- Clothing Manipulation and Hygiene: Set up;Sitting/lateral lean       Functional mobility during ADLs: Min guard;Cane General ADL Comments: Able to ambulate just outside of her door with  cane.     Vision Baseline Vision/History: 1 Wears glasses Ability to See in Adequate Light: 0 Adequate Patient Visual Report: No change from baseline Vision Assessment?: No apparent visual deficits                Pertinent Vitals/Pain Pain Assessment Pain Assessment: 0-10 Pain Score: 6  Pain Location: R chest/ shoulder; R side Pain Descriptors / Indicators: Sharp, Stabbing Pain Intervention(s): Limited activity within patient's tolerance, Monitored during session, Repositioned     Hand Dominance Right   Extremity/Trunk Assessment Upper Extremity Assessment Upper Extremity Assessment: Generalized weakness   Lower Extremity Assessment Lower Extremity Assessment: Defer to PT evaluation   Cervical / Trunk Assessment Cervical / Trunk Assessment: Normal   Communication Communication Communication: No difficulties   Cognition Arousal/Alertness: Awake/alert Behavior During Therapy: WFL for tasks assessed/performed Overall Cognitive Status: Within Functional Limits for tasks assessed                                                        Home Living Family/patient expects to be discharged to:: Private residence Living Arrangements: Spouse/significant other Available Help at Discharge: Available 24 hours/day;Family (For a time when returning home before husband has to leave.) Type of Home: Apartment Home Access: Level entry     Home Layout: One level     Bathroom Shower/Tub: Chief Strategy Officer: Standard Bathroom Accessibility: Yes How Accessible: Accessible via walker;Accessible via wheelchair Home Equipment: Gilmer Mor - single point          Prior Functioning/Environment Prior Level of Function : Independent/Modified Independent             Mobility Comments: Cane for community ambulation PRN ADLs Comments: Independent        OT Problem List: Decreased strength;Decreased activity tolerance;Impaired balance (sitting  and/or standing)      OT Treatment/Interventions: Self-care/ADL training;Therapeutic exercise;Therapeutic activities;Patient/family education;Balance training    OT Goals(Current goals can be found in the care plan section) Acute Rehab OT Goals Patient Stated Goal: return home OT Goal Formulation: With patient Time For Goal Achievement: 01/09/23 Potential to Achieve Goals: Good  OT Frequency: Min 1X/week    Co-evaluation                               End of Session Equipment Utilized During Treatment:  (cane)  Activity Tolerance: Patient tolerated treatment well Patient left: in bed;with call bell/phone within reach;with family/visitor present  OT Visit Diagnosis: Unsteadiness on feet (R26.81);Other abnormalities of gait and mobility (R26.89);Muscle weakness (generalized) (M62.81)                Time: 4401-0272 OT Time Calculation (min): 17 min Charges:  OT General Charges $OT Visit: 1 Visit OT Evaluation $OT Eval Low Complexity: 1 Low  Tanishia Lemaster OT, MOT   Danie Chandler 12/26/2022, 9:47 AM

## 2022-12-26 NOTE — Plan of Care (Signed)
  Problem: Acute Rehab OT Goals (only OT should resolve) Goal: Pt. Will Perform Lower Body Bathing Flowsheets (Taken 12/26/2022 0950) Pt Will Perform Lower Body Bathing:  with modified independence  sitting/lateral leans Goal: Pt. Will Perform Lower Body Dressing Flowsheets (Taken 12/26/2022 0950) Pt Will Perform Lower Body Dressing:  with modified independence  sitting/lateral leans Goal: Pt. Will Transfer To Toilet Flowsheets (Taken 12/26/2022 [phone]) Pt Will Transfer to Toilet:  with modified independence  ambulating Goal: Pt/Caregiver Will Perform Home Exercise Program Flowsheets (Taken 12/26/2022 [phone]) Pt/caregiver will Perform Home Exercise Program:  Increased strength  Both right and left upper extremity  Independently  Jermine Bibbee OT, MOT

## 2022-12-27 ENCOUNTER — Other Ambulatory Visit: Payer: Self-pay

## 2022-12-27 ENCOUNTER — Encounter (HOSPITAL_COMMUNITY): Payer: Self-pay | Admitting: Urology

## 2022-12-27 LAB — CBC
HCT: 33.3 % — ABNORMAL LOW (ref 36.0–46.0)
Hemoglobin: 10.3 g/dL — ABNORMAL LOW (ref 12.0–15.0)
MCH: 28.1 pg (ref 26.0–34.0)
MCHC: 30.9 g/dL (ref 30.0–36.0)
MCV: 90.7 fL (ref 80.0–100.0)
Platelets: 168 10*3/uL (ref 150–400)
RBC: 3.67 MIL/uL — ABNORMAL LOW (ref 3.87–5.11)
RDW: 13.5 % (ref 11.5–15.5)
WBC: 10.5 10*3/uL (ref 4.0–10.5)
nRBC: 0 % (ref 0.0–0.2)

## 2022-12-27 LAB — GLUCOSE, CAPILLARY
Glucose-Capillary: 137 mg/dL — ABNORMAL HIGH (ref 70–99)
Glucose-Capillary: 109 mg/dL — ABNORMAL HIGH (ref 70–99)
Glucose-Capillary: 152 mg/dL — ABNORMAL HIGH (ref 70–99)
Glucose-Capillary: 207 mg/dL — ABNORMAL HIGH (ref 70–99)

## 2022-12-27 NOTE — Progress Notes (Signed)
2 Days Post-Op Subjective: Patient reports mild incisional pain. positive flatus. No nausea or vomiting. Hgb 10.3. Tolerating clear liquids  Objective: Vital signs in last 24 hours: Temp:  [98 F (36.7 C)-98.8 F (37.1 C)] 98.2 F (36.8 C) (07/31 1346) Pulse Rate:  [61-68] 62 (07/31 1346) Resp:  [17-20] 17 (07/31 1346) BP: (86-116)/(54-73) 90/54 (07/31 1346) SpO2:  [94 %-97 %] 94 % (07/31 1346)  Intake/Output from previous day: 07/30 0701 - 07/31 0700 In: 420 [P.O.:420] Out: 410 [Urine:200; Drains:210] Intake/Output this shift: Total I/O In: 480 [P.O.:480] Out: 130 [Drains:130]  Physical Exam:  General:alert, cooperative, and appears stated age GI: soft, non tender, normal bowel sounds, no palpable masses, no organomegaly, no inguinal hernia Female genitalia: not done Extremities: extremities normal, atraumatic, no cyanosis or edema  Lab Results: Recent Labs    12/25/22 1214 12/26/22 0417 12/27/22 0835  HGB 11.0* 10.0* 10.3*  HCT 33.2* 31.3* 33.3*   BMET Recent Labs    12/25/22 1214 12/26/22 0417  NA 130* 135  K 4.6 3.9  CL 101 102  CO2 24 26  GLUCOSE 299* 208*  BUN 22* 23*  CREATININE 0.86 0.93  CALCIUM 8.0* 8.2*   No results for input(s): "LABPT", "INR" in the last 72 hours. No results for input(s): "LABURIN" in the last 72 hours. Results for orders placed or performed in visit on 11/08/22  Microscopic Examination     Status: Abnormal   Collection Time: 11/08/22  2:17 PM   Urine  Result Value Ref Range Status   WBC, UA 0-5 0 - 5 /hpf Final   RBC, Urine 3-10 (A) 0 - 2 /hpf Final   Epithelial Cells (non renal) >10 (A) 0 - 10 /hpf Final   Bacteria, UA Moderate (A) None seen/Few Final    Studies/Results: No results found.  Assessment/Plan: POD#2  right radical nephrectomy Ambulate in halls with assistance Regular diet Continue current pain control regiment Likely discharge tomorrow   LOS: 2 days   Wilkie Aye 12/27/2022, 3:47 PM

## 2022-12-27 NOTE — Progress Notes (Signed)
Patients BP still trending low, improvement noted. Night time metoprolol and hydroxyzine held due to soft BP. Patient sitting up in bed, eating a sandwich, family at bedside.

## 2022-12-27 NOTE — Progress Notes (Signed)
Mobility Specialist Progress Note:    12/27/22 1050  Mobility  Activity Transferred from bed to chair  Level of Assistance Minimal assist, patient does 75% or more  Assistive Device None  Distance Ambulated (ft) 4 ft  Range of Motion/Exercises Active;All extremities  Activity Response Tolerated well  Mobility Referral Yes  $Mobility charge 1 Mobility  Mobility Specialist Start Time (ACUTE ONLY) 1050  Mobility Specialist Stop Time (ACUTE ONLY) 1100  Mobility Specialist Time Calculation (min) (ACUTE ONLY) 10 min   Pt was received in bed, family in room. Agreeable to mobility session, MinA with no AD to transfer from [facility]. Tolerated well, pt c/o some abdominal pain. Left pt in chair, call bell in reach. All needs met.   Lawerance Bach Mobility Specialist Please contact via Special educational needs teacher or  Rehab office at [phone]

## 2022-12-27 NOTE — Progress Notes (Signed)
Patient is up eating dinner with family at bedside.

## 2022-12-27 NOTE — Progress Notes (Addendum)
   12/27/22 1605  Vitals  BP (!) 96/51  MAP (mmHg) 65  Pulse Rate 64  Resp 17  MEWS COLOR  MEWS Score Color Green  Oxygen Therapy  SpO2 95 %  O2 Device Nasal Cannula  O2 Flow Rate (L/min) 2 L/min  MEWS Score  MEWS Temp 0  MEWS Systolic 1  MEWS Pulse 0  MEWS RR 0  MEWS LOC 0  MEWS Score 1   Patient's husband came to the desk asking for the nurse. This nurse entered the patient's room. The husband and daughter were ambulating patient to the bathroom. The patient seemed fatigued and was hanging onto her husband. Husband states that patient has not been like this since she has been here. Patient is able to answer all questions appropriately, but was not this drowsy and unsteady on her feet this morning. Patient guided back to bed and vital signs taken. Patient did not have nasal cannula in her nostrils while ambulating to the bathroom. O2 saturation was 80% on RA. Patient was placed back on 2L nasal canula with a result of 95%. Patient is now resting in the bed. MD Mckenzie notified.  PRN Dilaudid D/Ced per MD Mckenzie's verbal order.

## 2022-12-27 NOTE — Progress Notes (Addendum)
   12/27/22 1346  Vitals  Temp 98.2 F (36.8 C)  Temp Source Oral  BP (!) 90/54  MAP (mmHg) 67  BP Location Left Arm  BP Method Automatic  Patient Position (if appropriate) Lying  Pulse Rate 62  Pulse Rate Source Dinamap  Resp 17  MEWS COLOR  MEWS Score Color Green  Oxygen Therapy  SpO2 94 %  O2 Device Nasal Cannula  MEWS Score  MEWS Temp 0  MEWS Systolic 1  MEWS Pulse 0  MEWS RR 0  MEWS LOC 0  MEWS Score 1   MD Mckenzie notified. Patient bp has been soft. MD Mckenzie states to hold bp meds.

## 2022-12-28 ENCOUNTER — Inpatient Hospital Stay (HOSPITAL_COMMUNITY): Payer: 59

## 2022-12-28 DIAGNOSIS — Z905 Acquired absence of kidney: Secondary | ICD-10-CM | POA: Insufficient documentation

## 2022-12-28 DIAGNOSIS — N2889 Other specified disorders of kidney and ureter: Secondary | ICD-10-CM | POA: Diagnosis not present

## 2022-12-28 LAB — BASIC METABOLIC PANEL
Anion gap: 8 (ref 5–15)
BUN: 35 mg/dL — ABNORMAL HIGH (ref 6–20)
CO2: 24 mmol/L (ref 22–32)
Calcium: 7.7 mg/dL — ABNORMAL LOW (ref 8.9–10.3)
Chloride: 104 mmol/L (ref 98–111)
Creatinine, Ser: 2.3 mg/dL — ABNORMAL HIGH (ref 0.44–1.00)
GFR, Estimated: 24 mL/min — ABNORMAL LOW (ref >60)
Glucose, Bld: 122 mg/dL — ABNORMAL HIGH (ref 70–99)
Potassium: 3.8 mmol/L (ref 3.5–5.1)
Sodium: 136 mmol/L (ref 135–145)

## 2022-12-28 LAB — LACTIC ACID, PLASMA: Lactic Acid, Venous: 0.6 mmol/L (ref 0.5–1.9)

## 2022-12-28 LAB — GLUCOSE, CAPILLARY
Glucose-Capillary: 121 mg/dL — ABNORMAL HIGH (ref 70–99)
Glucose-Capillary: 153 mg/dL — ABNORMAL HIGH (ref 70–99)
Glucose-Capillary: 136 mg/dL — ABNORMAL HIGH (ref 70–99)
Glucose-Capillary: 163 mg/dL — ABNORMAL HIGH (ref 70–99)

## 2022-12-28 MED ORDER — FUROSEMIDE 20 MG PO TABS
20.0000 mg | ORAL_TABLET | Freq: Two times a day (BID) | ORAL | Status: DC
  Administered 2022-12-28: 20 mg via ORAL
  Filled 2022-12-28: qty 1

## 2022-12-28 NOTE — Progress Notes (Signed)
Patients husband was updated on patient's condition.

## 2022-12-28 NOTE — Progress Notes (Addendum)
   12/28/22 0556  Vitals  Temp 99.3 F (37.4 C)  BP (!) 99/54  MAP (mmHg) 69  BP Location Left Arm  BP Method Automatic  Patient Position (if appropriate) Lying  Pulse Rate 73  Pulse Rate Source Monitor  Resp 14  MEWS COLOR  MEWS Score Color Green  Oxygen Therapy  SpO2 97 %  O2 Device Nasal Cannula  O2 Flow Rate (L/min) 2 L/min  MEWS Score  MEWS Temp 0  MEWS Systolic 1  MEWS Pulse 0  MEWS RR 0  MEWS LOC 0  MEWS Score 1   Dr. Carren Rang made aware of vitals, altered mental status, and red face, awaiting orders at this time  Lactic acid blood draw ordered

## 2022-12-28 NOTE — Progress Notes (Deleted)
Name: Christina Cameron DOB: 01/31/1966 MRN: [id number]  Diagnoses: Post-operative state  HPI: Christina Cameron presents post-operatively s/p the following procedure by Dr. Ronne Binning on 12/25/2022:  Preoperative diagnosis: Right renal mass   Postop diagnosis: Same   Procedure:  1.  right robot assisted laparoscopic radical nephrectomy  Pathology:  A. KIDNEY, RIGHT, RADICAL NEPHRECTOMY:  Clear cell renal cell carcinoma   ***Consulted Dr. Ronne Binning on 12/28/2022; he advised referral to medical oncology.  ***remove staples  Postop course: Today She reports***  She {Actions; denies-reports:120008} redness, warmth, tenderness, swelling, or drainage at the incision site(s).  She {Actions; denies-reports:120008} fevers.  She {Actions; denies-reports:120008} increased urinary urgency, frequency, nocturia, dysuria, gross hematuria, hesitancy, straining to void, or sensations of incomplete emptying.  She {Actions; denies-reports:120008} flank pain.  She {Actions; denies-reports:120008} abdominal pain.   Fall Screening: Do you usually have a device to assist in your mobility? {yes/no:20286} ***cane / ***walker / ***wheelchair   Medications: No current facility-administered medications for this visit.   No current outpatient medications on file.   Facility-Administered Medications Ordered in Other Visits  Medication Dose Route Frequency Provider Last Rate Last Admin   0.9 %  sodium chloride infusion   Intravenous Continuous Ronne Binning Mardene Celeste, MD 50 mL/hr at 12/28/22 0348 Infusion Verify at 12/28/22 0348   acetaminophen (TYLENOL) tablet 650 mg  650 mg Oral Q4H PRN Malen Gauze, MD   650 mg at 12/28/22 0624   albuterol (PROVENTIL) (2.5 MG/3ML) 0.083% nebulizer solution 2.5 mg  2.5 mg Inhalation Q6H PRN Malen Gauze, MD       Azelastine-Fluticasone 137-50 MCG/ACT SUSP 1 spray  1 spray Nasal BID PRN Ronne Binning Mardene Celeste, MD       brexpiprazole (REXULTI) tablet 2 mg  2 mg Oral  Daily Malen Gauze, MD   2 mg at 12/28/22 0816   buPROPion (WELLBUTRIN XL) 24 hr tablet 150 mg  150 mg Oral q morning Malen Gauze, MD   150 mg at 12/28/22 0816   busPIRone (BUSPAR) tablet 10 mg  10 mg Oral BID Malen Gauze, MD   10 mg at 12/28/22 6433   Chlorhexidine Gluconate Cloth 2 % PADS 6 each  6 each Topical Daily Malen Gauze, MD   6 each at 12/28/22 2951   diphenhydrAMINE (BENADRYL) injection 12.5-25 mg  12.5-25 mg Intravenous Q6H PRN Malen Gauze, MD       Or   diphenhydrAMINE (BENADRYL) 12.5 MG/5ML elixir 12.5-25 mg  12.5-25 mg Oral Q6H PRN Malen Gauze, MD       estradiol (ESTRACE) tablet 0.5 mg  0.5 mg Oral Daily Malen Gauze, MD   0.5 mg at 12/28/22 8841   gabapentin (NEURONTIN) capsule 300 mg  300 mg Oral TID Malen Gauze, MD   300 mg at 12/28/22 0815   hydrOXYzine (ATARAX) tablet 100 mg  100 mg Oral QHS Malen Gauze, MD   100 mg at 12/26/22 2101   insulin aspart (novoLOG) injection 0-15 Units  0-15 Units Subcutaneous TID WC Malen Gauze, MD   3 Units at 12/28/22 1144   insulin aspart (novoLOG) injection 0-5 Units  0-5 Units Subcutaneous QHS Malen Gauze, MD   2 Units at 12/27/22 2208   isosorbide mononitrate (IMDUR) 24 hr tablet 30 mg  30 mg Oral Daily Malen Gauze, MD   30 mg at 12/28/22 0816   lamoTRIgine (LAMICTAL) tablet 200 mg  200 mg Oral Daily McKenzie, Mardene Celeste,  MD   200 mg at 12/28/22 0816   metoprolol tartrate (LOPRESSOR) tablet 100 mg  100 mg Oral BID Malen Gauze, MD   100 mg at 12/27/22 0807   ondansetron (ZOFRAN) injection 4 mg  4 mg Intravenous Q4H PRN Malen Gauze, MD   4 mg at 12/25/22 1442   oxyCODONE (Oxy IR/ROXICODONE) immediate release tablet 5 mg  5 mg Oral Q4H PRN Malen Gauze, MD   5 mg at 12/26/22 0551   rosuvastatin (CRESTOR) tablet 40 mg  40 mg Oral Daily Malen Gauze, MD   40 mg at 12/28/22 0815   senna-docusate (Senokot-S) tablet 2 tablet  2  tablet Oral QHS Malen Gauze, MD   2 tablet at 12/27/22 2207   sertraline (ZOLOFT) tablet 200 mg  200 mg Oral Daily Malen Gauze, MD   200 mg at 12/28/22 0816   zolpidem (AMBIEN) tablet 5 mg  5 mg Oral QHS PRN Malen Gauze, MD        Allergies: No Known Allergies  Past Medical History:  Diagnosis Date   Arm vein blood clot, unspecified laterality    around 20 years ago as of 08/10/21, Patient doesn't remember what caused it. She was not put on blood thinners.   Arthritis    right knee   Asthma    Cancer (HCC)    cervical 1989   Cancer of kidney (HCC)    Chest pain    a. normal cors by cath in 11/2014 / 03/02/2020 nuclear stress test demonstrated no perfusion defects consistent with prior infart or current ischemia. Normal study.   Chronic diastolic (congestive) heart failure (HCC)    09/27/20 Echocardiogram in Epic showed LVEF 60 to 65%.   Complication of anesthesia    post-operative nausea & vomiting   COVID-19    05/29/19 & 05/2020 Covid infections   Depression    Diabetes mellitus, type 2 (HCC)    Dysrhythmia    heart palpitations   GERD (gastroesophageal reflux disease)    H/O cardiovascular stress test 03/02/2020   03/02/2020 Nuclear stress test in Epic showed no perusion defects consistent with prior infarct or ischemia - normal study.   Headache    migraines   High cholesterol    pt takes Crestor   History of cardiac radiofrequency ablation    Hypertension    Last cardiology office visit, 10/22/20 with Rennis Harding, NP ( see note in Epic) as of 08/09/21.   Morbid (severe) obesity due to excess calories (HCC)    Neuromuscular disorder (HCC)    severe neuropathy in feet   Osteoarthritis of knee, unspecified    Palpitations    PONV (postoperative nausea and vomiting)    Post-COVID syndrome 05/2019   Pt experienced chest tightness, sob, palpitations & dizziness after Covid infection. See 04/2020 note from Rennis Harding, NP in [place].   PTSD (post-traumatic  stress disorder)    Raynaud's syndrome    Sleep apnea 09/24/2020   sleep study indicated moderate sleep apea   SVT (supraventricular tachycardia)    a. s/p ablation by Dr. Ladona Ridgel in 2006.   Wears glasses    prescripton reading glasses   Past Surgical History:  Procedure Laterality Date   CARDIAC CATHETERIZATION N/A 12/14/2014   Procedure: Right/Left Heart Cath and Coronary Angiography;  Surgeon: Tonny Bollman, MD;  Location: [facility] INVASIVE CV LAB;  Service: Cardiovascular;  Laterality: N/A;   CARDIAC ELECTROPHYSIOLOGY STUDY AND ABLATION     around 2006,  Patient states that heart rate was up to 180 bpm.   CESAREAN SECTION     x2 1990, 2005   CYSTOSCOPY  08/17/2021   Procedure: CYSTOSCOPY;  Surgeon: Carrington Clamp, MD;  Location: [facility];  Service: Gynecology;;   ENDOMETRIAL ABLATION     HYSTEROSCOPY WITH D & C N/A 03/11/2021   Procedure: DILATATION AND CURETTAGE /HYSTEROSCOPY;  Surgeon: Marlow Baars, MD;  Location: [facility] OR;  Service: Gynecology;  Laterality: N/A;   OPERATIVE ULTRASOUND N/A 03/11/2021   Procedure: OPERATIVE ULTRASOUND;  Surgeon: Marlow Baars, MD;  Location: [facility] OR;  Service: Gynecology;  Laterality: N/A;  ultrasound guidance needed   ROBOT ASSISTED LAPAROSCOPIC NEPHRECTOMY Right 12/25/2022   Procedure: XI ROBOTIC ASSISTED LAPAROSCOPIC NEPHRECTOMY;  Surgeon: Malen Gauze, MD;  Location: [facility] ORS;  Service: Urology;  Laterality: Right;   ROBOTIC ASSISTED LAPAROSCOPIC HYSTERECTOMY AND SALPINGECTOMY Bilateral 08/17/2021   Procedure: XI ROBOTIC ASSISTED LAPAROSCOPIC HYSTERECTOMY AND  BILATERAL SALPINGECTOMY AND OOPHERETOMY;  Surgeon: Carrington Clamp, MD;  Location: [facility];  Service: Gynecology;  Laterality: Bilateral;   Family History  Problem Relation Age of Onset   Cancer Mother    Cancer Father    Parkinson's disease Father    Stroke Brother    Hypertension Brother    Cancer - Colon Brother    Social History    Socioeconomic History   Marital status: Married    Spouse name: Not on file   Number of children: Not on file   Years of education: Not on file   Highest education level: Not on file  Occupational History   Not on file  Tobacco Use   Smoking status: Never    Passive exposure: Past   Smokeless tobacco: Never  Vaping Use   Vaping status: Never Used  Substance and Sexual Activity   Alcohol use: Yes    Comment: seldom   Drug use: No   Sexual activity: Yes    Birth control/protection: None  Other Topics Concern   Not on file  Social History Narrative   Caffiene  occasional pepsi 1-2 days.    Education: some college   Work disability: PTSD. Medical.   Social Determinants of Health   Financial Resource Strain: Not on file  Food Insecurity: No Food Insecurity (12/25/2022)   Hunger Vital Sign    Worried About Running Out of Food in the Last Year: Never true    Ran Out of Food in the Last Year: Never true  Transportation Needs: No Transportation Needs (12/25/2022)   PRAPARE - Administrator, Civil Service (Medical): No    Lack of Transportation (Non-Medical): No  Physical Activity: Not on file  Stress: Not on file  Social Connections: Not on file  Intimate Partner Violence: Not At Risk (12/25/2022)   Humiliation, Afraid, Rape, and Kick questionnaire    Fear of Current or Ex-Partner: No    Emotionally Abused: No    Physically Abused: No    Sexually Abused: No    SUBJECTIVE  Review of Systems Constitutional: Patient ***denies any unintentional weight loss or change in strength lntegumentary: Patient ***denies any rashes or pruritus Eyes: Patient denies ***dry eyes ENT: Patient ***denies dry mouth Cardiovascular: Patient ***denies chest pain or syncope Respiratory: Patient ***denies shortness of breath Gastrointestinal: Patient ***denies nausea, vomiting, constipation, or diarrhea Musculoskeletal: Patient ***denies muscle cramps or weakness Neurologic:  Patient ***denies convulsions or seizures Psychiatric: Patient ***denies memory problems Allergic/Immunologic: Patient ***denies recent allergic reaction(s) Hematologic/Lymphatic: Patient denies  bleeding tendencies Endocrine: Patient ***denies heat/cold intolerance  GU: As per HPI.  OBJECTIVE There were no vitals filed for this visit. There is no height or weight on file to calculate BMI.  Physical Examination  Constitutional: ***No obvious distress; patient is ***non-toxic appearing  Cardiovascular: ***No visible lower extremity edema.  Respiratory: The patient does ***not have audible wheezing/stridor; respirations do ***not appear labored  Gastrointestinal: Abdomen ***non-distended Musculoskeletal: ***Normal ROM of UEs  Neurologic: CN 2-12 grossly ***intact Psychiatric: Answered questions ***appropriately with ***normal affect   GU: ***Incision well healed, intact, with no surrounding erythema, edema, crepitus, fluctuance, drainage / discharge, warmth, significant tenderness to palpation.   UA: {Desc; negative/positive:13464} for *** WBC/hpf, *** RBC/hpf, bacteria (***) PVR: *** ml  ASSESSMENT No diagnosis found.  We reviewed the operative procedures and findings. She was advised that Dr. Ronne Binning was consulted prior to today's appointment and advised referral to medical oncology, which will be placed.  Surgical site healing *** well. Pain is*** well controlled. ***Staples removed; patient tolerated ***well.  Will plan for follow up with Dr. Ronne Binning in 3 months with CT abdomen/pelvis w/wo contrast, chest x-ray, and blood work (CMP) prior. Pt verbalized understanding and agreement. All questions were answered.  ***Orders: - Now: Referral to Medical Oncology. - For in 3 months: CT abdomen/pelvis w/wo contrast, chest x-ray, CMP  PLAN Advised the following: ***Referral placed to Medical Oncology. ***No follow-ups on file.  No orders of the defined types were placed in this  encounter.   It has been explained that the patient is to follow regularly with their PCP in addition to all other providers involved in their care and to follow instructions provided by these respective offices. Patient advised to contact urology clinic if any urologic-pertaining questions, concerns, new symptoms or problems arise in the interim period.  There are no Patient Instructions on file for this visit.  Electronically signed by:  Donnita Falls, MSN, FNP-C, CUNP 12/28/2022 12:42 PM

## 2022-12-28 NOTE — Progress Notes (Signed)
3 Days Post-Op Subjective: Patient reports mild incisional pain. positive flatus. No nausea or vomiting. Hgb 10.3. Tolerating regular diet. BPs have been low. Decreased urine output  Objective: Vital signs in last 24 hours: Temp:  [98.1 F (36.7 C)-99.3 F (37.4 C)] 98.1 F (36.7 C) (08/01 1451) Pulse Rate:  [70-75] 71 (08/01 1451) Resp:  [14-20] 17 (08/01 1451) BP: (95-111)/(45-68) 111/66 (08/01 1451) SpO2:  [93 %-99 %] 93 % (08/01 1451)  Intake/Output from previous day: 07/31 0701 - 08/01 0700 In: 2720 [P.O.:720; I.V.:2000] Out: 650 [Urine:450; Drains:200] Intake/Output this shift: No intake/output data recorded.  Physical Exam:  General:alert, cooperative, and appears stated age GI: soft, non tender, normal bowel sounds, no palpable masses, no organomegaly, no inguinal hernia Female genitalia: not done Extremities: extremities normal, atraumatic, no cyanosis or edema  Lab Results: Recent Labs    12/26/22 0417 12/27/22 0835  HGB 10.0* 10.3*  HCT 31.3* 33.3*   BMET Recent Labs    12/26/22 0417 12/28/22 0630  NA 135 136  K 3.9 3.8  CL 102 104  CO2 26 24  GLUCOSE 208* 122*  BUN 23* 35*  CREATININE 0.93 2.30*  CALCIUM 8.2* 7.7*   No results for input(s): "LABPT", "INR" in the last 72 hours. No results for input(s): "LABURIN" in the last 72 hours. Results for orders placed or performed in visit on 11/08/22  Microscopic Examination     Status: Abnormal   Collection Time: 11/08/22  2:17 PM   Urine  Result Value Ref Range Status   WBC, UA 0-5 0 - 5 /hpf Final   RBC, Urine 3-10 (A) 0 - 2 /hpf Final   Epithelial Cells (non renal) >10 (A) 0 - 10 /hpf Final   Bacteria, UA Moderate (A) None seen/Few Final    Studies/Results: DG Chest 2 View  Result Date: 12/28/2022 CLINICAL DATA:  141880 SOB (shortness of breath) 141880 EXAM: CHEST - 2 VIEW COMPARISON:  November 16, 2022 FINDINGS: The cardiomediastinal silhouette is unchanged in contour. No pleural effusion. No  pneumothorax. No acute pleuroparenchymal abnormality. Visualized abdomen is unremarkable. Multilevel degenerative changes of the thoracic spine. IMPRESSION: No acute cardiopulmonary abnormality. Electronically Signed   By: Meda Klinefelter M.D.   On: 12/28/2022 14:53    Assessment/Plan: POD#3  right radical nephrectomy Ambulate in halls with assistance Regular diet Continue current pain control regiment Hospitalist consulted for possible adrenal insufficiency and volume overload    LOS: 3 days   Wilkie Aye 12/28/2022, 7:38 PM

## 2022-12-28 NOTE — Progress Notes (Addendum)
family is in the room now and has questions regarding how patient is still acting drowsy and would like to speak with someone. Patient is more alert today than yesterday, but they are still concerned. MD Ronne Binning and MD [facility] notified.

## 2022-12-28 NOTE — Plan of Care (Signed)
  Problem: Fluid Volume: Goal: Ability to maintain a balanced intake and output will improve Outcome: Progressing   

## 2022-12-28 NOTE — Progress Notes (Signed)
Patient alert with periods of confusion, patient has slept heavily during the night, with drowsiness when woken up, patient up to bedside commode with assist during the night, no complaints of pain, nasal cannula reapplied multiple times during the night, patient redirected and educated on importance of keeping oxygen on. Bed alarm on, patient lying in bed at this time, call bell and personal items are within reach.

## 2022-12-28 NOTE — Progress Notes (Signed)
Patient awake and laying in bed at this time

## 2022-12-28 NOTE — Progress Notes (Signed)
Consultation Progress Note   Patient: Christina Cameron:[id number] DOB: 26-Oct-1965 DOA: 12/25/2022 DOS: the patient was seen and examined on 12/28/2022 Primary service: Malen Gauze, MD  Brief hospital course: 57 year old who presented for right radical nephrectomy.  Patient MRI showed large right renal mass with renal vein involvement but no IVC involvement.  There was a small retroperitoneal lymph nodes.  Currently status post 3 days postoperative intervention demonstrating stable hemoglobin level and tolerating clear/full liquid diet.  Patient has developed some shortness of breath requiring 2 L nasal cannula supplementation and has been experiencing low blood pressure with worsening renal function.  TRH has been consulted to further assist with management.  Assessment and Plan: Renal cell carcinoma -Status post right radical nephrectomy -Continue management per primary service (urology).  Hypertension/Hypertension -Prior history of severe hypertension requiring multiple agents to control blood pressure as an outpatient -Currently experiencing signs of to low blood pressure; with concerns for mild adrenal crisis after right-sided nephrectomy/adrenalectomy. -Will stop the use of Norvasc, Avapro, Lasix and Cardizem -Checking cortisol level and providing gentle fluid resuscitation entheses normal saline 75 cc/h).  Chest x-ray demonstrating no acute cardiopulmonary process or vascular congestion. -Continue close monitoring to patient's renal function and vital signs. -For now we will continue the use of indoor and metoprolol.  Acute kidney injury -In the setting of hypertension, post nephrectomy and hemodynamically imbalance -Continue gentle fluid resuscitation -Avoid hypotension -Minimize nephrotoxic agents -Follow renal function trend.  Depression/anxiety -Continue anxiolytic/antidepressant regimen  History of asthma -No significant wheezing or respiratory distress. -Continue the  use of incentive spirometer/flutter valve-continue home fluticasone nasal spray and as needed bronchodilators.  Class III obesity -Body mass index is 46.36 kg/m. -Low-calorie diet, portion control and increase physical activity discussed with patient.  TRH will continue to follow the patient.  Subjective:  No chest pain, no nausea, no vomiting.  2 L nasal cannula supplementation in place.  Reporting decrease in her urine output and not Feeling well.  Physical Exam: Vitals:   12/28/22 0556 12/28/22 0647 12/28/22 0814 12/28/22 1451  BP: (!) 99/54  (!) 103/59 111/66  Pulse: 73  70 71  Resp: 14  15 17   Temp: 99.3 F (37.4 C) 98.5 F (36.9 C) 98.8 F (37.1 C) 98.1 F (36.7 C)  TempSrc:  Tympanic Oral Oral  SpO2: 97%  99% 93%  Weight:      Height:       General exam: Alert, awake, oriented x 3; in no major distress.  Currently afebrile. Respiratory system: Clear to auscultation. Respiratory effort normal.  2 L nasal cannula in place.  No using accessory muscle. Cardiovascular system:RRR. No rubs or gallops. Gastrointestinal system: Abdomen is obese, nondistended, soft and complaining of right-sided abdominal discomfort on palpation.  There is no guarding.  Positive bowel sounds Central nervous system: Alert and oriented. No focal neurological deficits. Extremities: No cyanosis or clubbing.  Trace edema appreciated bilaterally. Skin: No petechiae. Psychiatry: Judgement and insight appear normal. Mood & affect appropriate.   Data Reviewed: Cortisol level: Pending Chest x-ray: Demonstrating no vascular congestion and no acute cardiopulmonary process. Lactic acid: 0.6 Basic metabolic panel: Sodium 136, potassium 3.8, chloride 104, bicarb 24, 35, creatinine 2.30 BUN, GFR 24  Family Communication: Husband at bedside  Time spent: 50 minutes.  Author: Vassie Loll, MD 12/28/2022 6:04 PM  For on call review [email or web address].

## 2022-12-29 DIAGNOSIS — N2889 Other specified disorders of kidney and ureter: Secondary | ICD-10-CM | POA: Diagnosis not present

## 2022-12-29 LAB — GLUCOSE, CAPILLARY
Glucose-Capillary: 164 mg/dL — ABNORMAL HIGH (ref 70–99)
Glucose-Capillary: 150 mg/dL — ABNORMAL HIGH (ref 70–99)
Glucose-Capillary: 128 mg/dL — ABNORMAL HIGH (ref 70–99)
Glucose-Capillary: 160 mg/dL — ABNORMAL HIGH (ref 70–99)

## 2022-12-29 MED ORDER — OXYCODONE-ACETAMINOPHEN 5-325 MG PO TABS: 1.0000 | ORAL_TABLET | ORAL | 0 refills | Status: DC | PRN

## 2022-12-29 MED ORDER — SENNA 8.6 MG PO TABS: 1.0000 | ORAL_TABLET | Freq: Every day | ORAL | 0 refills | Status: DC

## 2022-12-29 MED ORDER — BISACODYL 10 MG RE SUPP
10.0000 mg | Freq: Every day | RECTAL | Status: DC
  Administered 2022-12-29: 10 mg via RECTAL
  Filled 2022-12-29 (×2): qty 1

## 2022-12-29 MED ORDER — POLYETHYLENE GLYCOL 3350 17 G PO PACK
17.0000 g | PACK | Freq: Every day | ORAL | Status: DC
  Filled 2022-12-29: qty 1

## 2022-12-29 NOTE — Progress Notes (Signed)
Consultation Progress Note   Patient: Christina Cameron ZOX:[id number] DOB: [birth year] DOA: 12/25/2022 DOS: the patient was seen and examined on 12/29/2022 Primary service: Malen Gauze, MD  Brief hospital course: 57 year old who presented for right radical nephrectomy.  Patient MRI showed large right renal mass with renal vein involvement but no IVC involvement.  There was a small retroperitoneal lymph nodes.  Currently status post 3 days postoperative intervention demonstrating stable hemoglobin level and tolerating clear/full liquid diet.  Patient has developed some shortness of breath requiring 2 L nasal cannula supplementation and has been experiencing low blood pressure with worsening renal function.  TRH has been consulted to further assist with management.  Assessment and Plan: Renal cell carcinoma -Status post right radical nephrectomy -Continue management per primary service (urology).  Hypertension/Hypertension -Prior history of severe hypertension requiring multiple agents to control blood pressure as an outpatient -Currently experiencing signs of to low blood pressure; with concerns for mild adrenal crisis after right-sided nephrectomy/adrenalectomy. -Will stop the use of Norvasc, Avapro, Lasix and Cardizem -Chest x-ray demonstrated no acute cardiopulmonary process or vascular congestion. -Continue close monitoring to patient's renal function and vital signs. -Will adjust fluid resuscitation to 50 mL/h and encourage oral intake. -For now we will continue the use of imdur and metoprolol.  Acute kidney injury -In the setting of hypertension, post nephrectomy and hemodynamically imbalance -Continue gentle fluid resuscitation -Avoid hypotension -Minimize nephrotoxic agents -Renal function demonstrating improvement and stabilization with a creatinine down to 1.30 -Continue to follow trend.  Depression/anxiety -Continue anxiolytic/antidepressant regimen  History of asthma -No  significant wheezing or respiratory distress. -Continue the use of incentive spirometer/flutter valve-continue home fluticasone nasal spray and as needed bronchodilators.  Class III obesity -Body mass index is 46.36 kg/m. -Low-calorie diet, portion control and increase physical activity discussed with patient.  Constipation -Probably ileus/low transit with analgesics -As needed laxative, adequate hydration and increase physical activity discussed with patient.  TRH will continue to follow the patient.  Subjective:  Feeling better, no requiring oxygen supplementation and with improved blood pressure/vital signs.  Reports no nausea vomiting.  Still complaining of constipation.  Physical Exam: Vitals:   12/28/22 2036 12/29/22 0435 12/29/22 0934 12/29/22 1355  BP: (!) 106/52 (!) 132/48 133/80 117/67  Pulse: 72 80  73  Resp: 20 18 18 18   Temp: 98.6 F (37 C) 98.4 F (36.9 C) 99 F (37.2 C) 98.6 F (37 C)  TempSrc: Oral Oral Oral Oral  SpO2: 98% 94% 97% 93%  Weight:      Height:       General exam: Afebrile, more alert and interactive; still reporting pain in her flank and with movement.  No requiring oxygen supplementation and overall feeling better. Respiratory system: Good air movement bilaterally; no using accessory muscles. Cardiovascular system:RRR. No rubs or gallops. Gastrointestinal system: Abdomen is soft, obese and demonstrating positive bowel sounds. Central nervous system: Alert and oriented. No focal neurological deficits. Extremities: No cyanosis or clubbing; trace edema bilaterally appreciated. Skin: No petechiae. Psychiatry: Judgement and insight appear normal. Mood & affect appropriate.   Data Reviewed: Cortisol level: 17.7 Chest x-ray: Demonstrating no vascular congestion and no acute cardiopulmonary process. Lactic acid: 0.6 Basic metabolic panel: Sodium 139, potassium 3.8, chloride 107, bicarb 26, glucose 148, BUN 24, creatinine 1.30 and GFR 48  Family  Communication: Husband at bedside  Time spent: 50 minutes.  Author: Vassie Loll, MD 12/29/2022 5:56 PM  For on call review [email or web address].

## 2022-12-29 NOTE — Plan of Care (Signed)
  Problem: Education: Goal: Ability to describe self-care measures that may prevent or decrease complications (Diabetes Survival Skills Education) will improve Outcome: Progressing Goal: Individualized Educational Video(s) Outcome: Progressing   Problem: Coping: Goal: Ability to adjust to condition or change in health will improve Outcome: Progressing   Problem: Fluid Volume: Goal: Ability to maintain a balanced intake and output will improve Outcome: Progressing   Problem: Education: Goal: Ability to describe self-care measures that may prevent or decrease complications (Diabetes Survival Skills Education) will improve Outcome: Progressing Goal: Individualized Educational Video(s) Outcome: Progressing   Problem: Coping: Goal: Ability to adjust to condition or change in health will improve Outcome: Progressing   Problem: Fluid Volume: Goal: Ability to maintain a balanced intake and output will improve Outcome: Progressing

## 2022-12-29 NOTE — Progress Notes (Signed)
Patient up to the bathroom with assist multiple times through the night, patient rested through the night. JP drain remains charged, with bloody output noted. JP dressing changed, patient lying in bed at this time, call bell within reach.

## 2022-12-29 NOTE — Progress Notes (Signed)
4 Days Post-Op Subjective: Patient reports mild incisional pain. positive flatus. No nausea or vomiting. Hgb 10.3. Tolerating regular diet. BPs have been low.  Creatinine 1.3  Objective: Vital signs in last 24 hours: Temp:  [98.1 F (36.7 C)-98.6 F (37 C)] 98.4 F (36.9 C) (08/02 0435) Pulse Rate:  [71-80] 80 (08/02 0435) Resp:  [17-20] 18 (08/02 0435) BP: (106-132)/(48-66) 132/48 (08/02 0435) SpO2:  [93 %-98 %] 94 % (08/02 0435)  Intake/Output from previous day: 08/01 0701 - 08/02 0700 In: 1127 [P.O.:720; I.V.:407] Out: 640 [Urine:500; Drains:140] Intake/Output this shift: Total I/O In: 240 [P.O.:240] Out: -   Physical Exam:  General:alert, cooperative, and appears stated age GI: soft, non tender, normal bowel sounds, no palpable masses, no organomegaly, no inguinal hernia Female genitalia: not done Extremities: extremities normal, atraumatic, no cyanosis or edema  Lab Results: Recent Labs    12/27/22 0835  HGB 10.3*  HCT 33.3*   BMET Recent Labs    12/28/22 0630 12/29/22 0445  NA 136 139  K 3.8 3.8  CL 104 107  CO2 24 26  GLUCOSE 122* 148*  BUN 35* 24*  CREATININE 2.30* 1.30*  CALCIUM 7.7* 8.0*   No results for input(s): "LABPT", "INR" in the last 72 hours. No results for input(s): "LABURIN" in the last 72 hours. Results for orders placed or performed in visit on 11/08/22  Microscopic Examination     Status: Abnormal   Collection Time: 11/08/22  2:17 PM   Urine  Result Value Ref Range Status   WBC, UA 0-5 0 - 5 /hpf Final   RBC, Urine 3-10 (A) 0 - 2 /hpf Final   Epithelial Cells (non renal) >10 (A) 0 - 10 /hpf Final   Bacteria, UA Moderate (A) None seen/Few Final    Studies/Results: DG Chest 2 View  Result Date: 12/28/2022 CLINICAL DATA:  141880 SOB (shortness of breath) 141880 EXAM: CHEST - 2 VIEW COMPARISON:  November 16, 2022 FINDINGS: The cardiomediastinal silhouette is unchanged in contour. No pleural effusion. No pneumothorax. No acute  pleuroparenchymal abnormality. Visualized abdomen is unremarkable. Multilevel degenerative changes of the thoracic spine. IMPRESSION: No acute cardiopulmonary abnormality. Electronically Signed   By: Meda Klinefelter M.D.   On: 12/28/2022 14:53    Assessment/Plan: POD#3  right radical nephrectomy Ambulate in halls with assistance Regular diet Continue current pain control regiment JP removed today   LOS: 4 days   Wilkie Aye 12/29/2022, 9:25 AM

## 2022-12-29 NOTE — Progress Notes (Signed)
Ambulating in room with assist to bathroom. Plan of care on going.

## 2022-12-29 NOTE — Care Management Important Message (Signed)
Important Message  Patient Details  Name: Christina Cameron MRN: [id number] Date of Birth: Sep 11, 1965   Medicare Important Message Given:  Yes     Corey Harold 12/29/2022, 3:55 PM

## 2022-12-30 DIAGNOSIS — N2889 Other specified disorders of kidney and ureter: Secondary | ICD-10-CM | POA: Diagnosis not present

## 2022-12-30 LAB — GLUCOSE, CAPILLARY
Glucose-Capillary: 110 mg/dL — ABNORMAL HIGH (ref 70–99)
Glucose-Capillary: 141 mg/dL — ABNORMAL HIGH (ref 70–99)

## 2022-12-30 NOTE — Progress Notes (Signed)
Urology Inpatient Progress Note  Subjective: Patient doing very well.  Pain well controlled.  Tolerating reg diet with + BM  Anti-infectives: Anti-infectives (From admission, onward)    Start     Dose/Rate Route Frequency Ordered Stop   12/25/22 2000  ceFAZolin (ANCEF) IVPB 2g/100 mL premix        2 g 200 mL/hr over 30 Minutes Intravenous Every 8 hours 12/25/22 1423 12/26/22 0408   12/25/22 0627  ceFAZolin (ANCEF) IVPB 2g/100 mL premix  Status:  Discontinued        2 g 200 mL/hr over 30 Minutes Intravenous 30 min pre-op 12/25/22 4098 12/25/22 1347       Current Facility-Administered Medications  Medication Dose Route Frequency Provider Last Rate Last Admin   0.9 %  sodium chloride infusion   Intravenous Continuous Vassie Loll, MD 50 mL/hr at 12/29/22 1920 Rate Change at 12/29/22 1920   acetaminophen (TYLENOL) tablet 650 mg  650 mg Oral Q4H PRN Malen Gauze, MD   650 mg at 12/28/22 0624   albuterol (PROVENTIL) (2.5 MG/3ML) 0.083% nebulizer solution 2.5 mg  2.5 mg Inhalation Q6H PRN Malen Gauze, MD       Azelastine-Fluticasone 137-50 MCG/ACT SUSP 1 spray  1 spray Nasal BID PRN Malen Gauze, MD       bisacodyl (DULCOLAX) suppository 10 mg  10 mg Rectal Daily Malen Gauze, MD   10 mg at 12/29/22 1057   brexpiprazole (REXULTI) tablet 2 mg  2 mg Oral Daily Malen Gauze, MD   2 mg at 12/30/22 0850   buPROPion (WELLBUTRIN XL) 24 hr tablet 150 mg  150 mg Oral q morning Malen Gauze, MD   150 mg at 12/30/22 0849   busPIRone (BUSPAR) tablet 10 mg  10 mg Oral BID Malen Gauze, MD   10 mg at 12/30/22 1191   Chlorhexidine Gluconate Cloth 2 % PADS 6 each  6 each Topical Daily Malen Gauze, MD   6 each at 12/29/22 1206   estradiol (ESTRACE) tablet 0.5 mg  0.5 mg Oral Daily Malen Gauze, MD   0.5 mg at 12/30/22 0851   gabapentin (NEURONTIN) capsule 300 mg  300 mg Oral TID Malen Gauze, MD   300 mg at 12/30/22 0849   hydrOXYzine  (ATARAX) tablet 100 mg  100 mg Oral QHS Malen Gauze, MD   100 mg at 12/29/22 2209   insulin aspart (novoLOG) injection 0-15 Units  0-15 Units Subcutaneous TID WC Malen Gauze, MD   2 Units at 12/30/22 1149   insulin aspart (novoLOG) injection 0-5 Units  0-5 Units Subcutaneous QHS Malen Gauze, MD   2 Units at 12/27/22 2208   isosorbide mononitrate (IMDUR) 24 hr tablet 30 mg  30 mg Oral Daily Malen Gauze, MD   30 mg at 12/30/22 0850   lamoTRIgine (LAMICTAL) tablet 200 mg  200 mg Oral Daily Malen Gauze, MD   200 mg at 12/30/22 0852   metoprolol tartrate (LOPRESSOR) tablet 100 mg  100 mg Oral BID Malen Gauze, MD   100 mg at 12/30/22 0850   ondansetron (ZOFRAN) injection 4 mg  4 mg Intravenous Q4H PRN Malen Gauze, MD   4 mg at 12/25/22 1442   oxyCODONE (Oxy IR/ROXICODONE) immediate release tablet 5 mg  5 mg Oral Q4H PRN Malen Gauze, MD   5 mg at 12/30/22 1035   polyethylene glycol (MIRALAX / GLYCOLAX) packet 17 g  17 g Oral Daily Vassie Loll, MD       rosuvastatin (CRESTOR) tablet 40 mg  40 mg Oral Daily Malen Gauze, MD   40 mg at 12/30/22 1610   senna-docusate (Senokot-S) tablet 2 tablet  2 tablet Oral QHS Malen Gauze, MD   2 tablet at 12/29/22 2209   sertraline (ZOLOFT) tablet 200 mg  200 mg Oral Daily Malen Gauze, MD   200 mg at 12/30/22 0850   zolpidem (AMBIEN) tablet 5 mg  5 mg Oral QHS PRN Malen Gauze, MD         Objective: Vital signs in last 24 hours: Temp:  [98.3 F (36.8 C)-98.7 F (37.1 C)] 98.7 F (37.1 C) (08/03 0446) Pulse Rate:  [65-73] 65 (08/03 0446) Resp:  [18] 18 (08/03 0446) BP: (117-121)/(65-69) 121/69 (08/03 0446) SpO2:  [93 %-97 %] 97 % (08/03 0446)  Intake/Output from previous day: 08/02 0701 - 08/03 0700 In: 240 [P.O.:240] Out: -  Intake/Output this shift: Total I/O In: 120 [P.O.:120] Out: -   GENERAL APPEARANCE:  Well appearing, well developed, well nourished,  NAD  ABDOMEN:  Soft, port sites look good.  Extraction incision healing well.  Mild serous drainage but no erythema or induration or other sign of infection    Lab Results:  No results for input(s): "WBC", "HGB", "HCT", "PLT" in the last 72 hours. BMET Recent Labs    12/29/22 0445 12/30/22 0427  NA 139 138  K 3.8 3.6  CL 107 105  CO2 26 24  GLUCOSE 148* 120*  BUN 24* 15  CREATININE 1.30* 0.99  CALCIUM 8.0* 8.1*   PT/INR No results for input(s): "LABPROT", "INR" in the last 72 hours. ABG No results for input(s): "PHART", "HCO3" in the last 72 hours.  Invalid input(s): "PCO2", "PO2"  Studies/Results: DG Chest 2 View  Result Date: 12/28/2022 CLINICAL DATA:  141880 SOB (shortness of breath) 141880 EXAM: CHEST - 2 VIEW COMPARISON:  November 16, 2022 FINDINGS: The cardiomediastinal silhouette is unchanged in contour. No pleural effusion. No pneumothorax. No acute pleuroparenchymal abnormality. Visualized abdomen is unremarkable. Multilevel degenerative changes of the thoracic spine. IMPRESSION: No acute cardiopulmonary abnormality. Electronically Signed   By: Meda Klinefelter M.D.   On: 12/28/2022 14:53     Assessment & Plan: POD #5 s/p right RARN- Patient doing very well  DC home FU arranged for post op check and staple removal   Joline Maxcy, MD 12/30/2022

## 2022-12-30 NOTE — Progress Notes (Signed)
Discharge instructions reviewed with patient, verbalized understanding. Wound care instructions provided for as needed at home, patient verbalized understanding and able to return demonstration. IV removed. Patient to be transported to private vehicle via wheelchair.

## 2022-12-30 NOTE — Progress Notes (Signed)
Consultation Progress Note   Patient: Christina Cameron ZOX:[id number] DOB: [birth year] DOA: 12/25/2022 DOS: the patient was seen and examined on 12/30/2022 Primary service: Malen Gauze, MD  Brief hospital course: 57 year old who presented for right radical nephrectomy.  Patient MRI showed large right renal mass with renal vein involvement but no IVC involvement.  There was a small retroperitoneal lymph nodes.  Currently status post 3 days postoperative intervention demonstrating stable hemoglobin level and tolerating clear/full liquid diet.  Patient has developed some shortness of breath requiring 2 L nasal cannula supplementation and has been experiencing low blood pressure with worsening renal function.  TRH has been consulted to further assist with management.  Assessment and Plan: Renal cell carcinoma -Status post right radical nephrectomy -Continue management per primary service (urology).  Hypertension/Hypertension -Prior history of severe hypertension requiring multiple agents to control blood pressure as an outpatient -Currently experiencing signs of to low blood pressure; with concerns for mild adrenal crisis after right-sided nephrectomy/adrenalectomy. -Will stop the use of Norvasc, Avapro, Lasix and Cardizem -Chest x-ray demonstrated no acute cardiopulmonary process or vascular congestion. -For now we will continue the use of imdur, metoprolol and resume low-dose daily Lasix.. -Patient will require close outpatient follow-up to further follow blood pressure fluctuation and adjust antihypertensive regimen as required. -Heart healthy diet discussed with patient.  Acute kidney injury -In the setting of hypertension, post nephrectomy and hemodynamically imbalance -Maintain adequate hydration. -Continue to avoid hypotension -Continue to minimize nephrotoxic agents -Renal function demonstrating improvement and stabilization with a creatinine down to 1.30 -Renal function has  stabilized and completely back to normal.  Depression/anxiety -Continue anxiolytic/antidepressant regimen  History of asthma -No significant wheezing or respiratory distress. -Continue the use of incentive spirometer/flutter valve-continue home fluticasone nasal spray and as needed bronchodilators.  Class III obesity -Body mass index is 46.36 kg/m. -Low-calorie diet, portion control and increase physical activity discussed with patient.  Constipation -Probably ileus/low transit with analgesics -Continue as needed laxative, adequate hydration and increase physical activity discussed with patient.  Thank you for this consultation; patient medically stable and ready for discharge from internal medicine standpoint.  TRH will remain available for any question but will sign off at the moment.  Subjective:  In no acute distress; feeling much better and not requiring oxygen supplementation.  Patient hemodynamically stable.  Physical Exam: Vitals:   12/29/22 0934 12/29/22 1355 12/29/22 2109 12/30/22 0446  BP: 133/80 117/67 120/65 121/69  Pulse:  73 71 65  Resp: 18 18 18 18   Temp: 99 F (37.2 C) 98.6 F (37 C) 98.3 F (36.8 C) 98.7 F (37.1 C)  TempSrc: Oral Oral Oral Oral  SpO2: 97% 93% 96% 97%  Weight:      Height:       General exam: Alert, awake, oriented x 3; following commands appropriately and in no acute distress.  Hemodynamically stable and not requiring oxygen supplementation. Respiratory system: Clear to auscultation. Respiratory effort normal.  Good saturation on room air. Cardiovascular system:RRR. No rubs or gallops; no JVD. Gastrointestinal system: Abdomen is soft, appropriately tender to palpation in her right flank and with positive bowel sounds. Central nervous system: Alert and oriented. No focal neurological deficits. Extremities: No cyanosis or clubbing; trace edema appreciated bilaterally (per patient at baseline). Skin: No petechiae. Psychiatry: Judgement  and insight appear normal. Mood & affect appropriate.   Data Reviewed: Cortisol level: 17.7 Chest x-ray: Demonstrating no vascular congestion and no acute cardiopulmonary process. Lactic acid: 0.6 Basic metabolic panel: Sodium 138, potassium  3.6, chloride 105, bicarb 24, BUN 15, creatinine 0.99 and GFR >60  Family Communication: Husband at bedside  Time spent: 50 minutes.  Author: Vassie Loll, MD 12/30/2022 8:13 AM  For on call review [email or web address].

## 2022-12-30 NOTE — Progress Notes (Signed)
Patient rested through the night, up with assist to bathroom during the night using the walker, plan of care ongoing

## 2022-12-31 NOTE — Discharge Summary (Signed)
Patient ID: Christina Cameron MRN: [id number] DOB/AGE: 58-Jun-1967 58 y.o.  Admit date: 12/25/2022 Discharge date: 12/31/2022  Primary Care Physician:  Lianne Moris, PA-C  Discharge Diagnoses:   Renal mass  Consults:  None     Discharge Medications: Allergies as of 12/30/2022   No Known Allergies      Medication List     STOP taking these medications    amLODipine 5 MG tablet Commonly known as: NORVASC   ciclopirox 8 % solution Commonly known as: Penlac   diltiazem 90 MG tablet Commonly known as: CARDIZEM   irbesartan 300 MG tablet Commonly known as: AVAPRO   sulfamethoxazole-trimethoprim 800-160 MG tablet Commonly known as: BACTRIM DS   traMADol 50 MG tablet Commonly known as: ULTRAM       TAKE these medications    albuterol 108 (90 Base) MCG/ACT inhaler Commonly known as: VENTOLIN HFA Inhale 1-2 puffs into the lungs every 6 (six) hours as needed for wheezing or shortness of breath.   Azelastine-Fluticasone 137-50 MCG/ACT Susp Place 1 spray into the nose every 12 (twelve) hours.   brexpiprazole 2 MG Tabs tablet Commonly known as: REXULTI Take 2 mg by mouth daily.   buPROPion 150 MG 24 hr tablet Commonly known as: WELLBUTRIN XL Take 150 mg by mouth every morning.   busPIRone 10 MG tablet Commonly known as: BUSPAR Take 10 mg by mouth 2 (two) times daily.   cetirizine 10 MG tablet Commonly known as: ZYRTEC Take 10 mg by mouth daily as needed for allergies.   estradiol 0.5 MG tablet Commonly known as: ESTRACE Take 0.5 mg by mouth daily.   ezetimibe 10 MG tablet Commonly known as: ZETIA Take 1 tablet (10 mg total) by mouth daily.   furosemide 20 MG tablet Commonly known as: LASIX Take 1 tablet (20 mg total) by mouth daily. What changed:  when to take this reasons to take this   gabapentin 300 MG capsule Commonly known as: NEURONTIN Take 1 capsule (300 mg total) by mouth 3 (three) times daily. Start at bedtime and slowly increase to 3x a day as  needed to make sure no side effects like sedation. What changed: when to take this   hydrOXYzine 50 MG tablet Commonly known as: ATARAX Take 100 mg by mouth at bedtime.   ibuprofen 600 MG tablet Commonly known as: ADVIL Take 1 tablet (600 mg total) by mouth every 8 (eight) hours as needed.   indapamide 2.5 MG tablet Commonly known as: LOZOL Take 2.5 mg by mouth daily as needed (fluid).   isosorbide mononitrate 30 MG 24 hr tablet Commonly known as: IMDUR Take 1 tablet (30 mg total) by mouth daily.   lamoTRIgine 200 MG tablet Commonly known as: LAMICTAL Take 200 mg by mouth daily.   metFORMIN 500 MG tablet Commonly known as: GLUCOPHAGE Take 1,000 mg by mouth 2 (two) times daily with a meal.   metoprolol tartrate 50 MG tablet Commonly known as: LOPRESSOR Take 2 tablets (100 mg total) by mouth 2 (two) times daily.   mupirocin ointment 2 % Commonly known as: BACTROBAN Apply 1 Application topically 2 (two) times daily.   nitroGLYCERIN 0.4 MG SL tablet Commonly known as: NITROSTAT DISSOLVE ONE TABLET UNDER THE TONGUE EVERY 5 MINUTES AS NEEDED FOR CHEST PAIN.  DO NOT EXCEED A TOTAL OF 3 DOSES IN 15 MINUTES   oxyCODONE-acetaminophen 5-325 MG tablet Commonly known as: Percocet Take 1 tablet by mouth every 4 (four) hours as needed for severe pain.  Ozempic (2 MG/DOSE) 8 MG/3ML Sopn Generic drug: Semaglutide (2 MG/DOSE) Inject 2 mg into the skin every 7 (seven) days.   rosuvastatin 40 MG tablet Commonly known as: CRESTOR Take 1 tablet (40 mg total) by mouth daily.   senna 8.6 MG Tabs tablet Commonly known as: SENOKOT Take 1 tablet (8.6 mg total) by mouth daily.   sertraline 100 MG tablet Commonly known as: ZOLOFT Take 200 mg by mouth daily.               Discharge Care Instructions  (From admission, onward)           Start     Ordered   12/30/22 0000  Change dressing (specify)       Comments: Dressing change: as need with gauze   12/30/22 1216              Significant Diagnostic Studies:  No results found.  Brief H and P: For complete details please refer to admission H and P, but in brief patient found to have a 10 cm right renal mass.  Mass not amendable to partial nephrectomy with plans for right radical nephrectomy  Hospital Course:  Principal Problem:   Right renal mass Patient was admitted to the urology service and on 12/25/2022 underwent a right robot-assisted laparoscopic radical nephrectomy.  Postoperatively the patient did quite well but on postoperative day 3 and 4 she did have some low blood pressure felt likely due to possible adrenal insufficiency.  She responded to conservative measures including fluid support.  On postoperative day 5 she was doing very well wanting to go home.  She was tolerating a regular diet and moving her bowels.  Her pain was under good control.  Day of Discharge BP 121/69 (BP Location: Left Wrist)   Pulse 65   Temp 98.7 F (37.1 C) (Oral)   Resp 18   Ht 5\' 4"  (1.626 m)   Wt 122.5 kg   SpO2 97%   BMI 46.36 kg/m   No results found for this or any previous visit (from the past 24 hour(s)).  Physical Exam: General: Alert and awake oriented x3 not in any acute distress.   Disposition:  home  Diet:  preadmission  Activity:  as tolerated.  No lifting or straining   TESTS THAT NEED FOLLOW-UP   path  DISCHARGE FOLLOW-UP   Follow-up Information     Donnita Falls, FNP Follow up on 01/02/2023.   Specialty: Urology Contact information: [address] [place] [facility] [address] [phone]                 Time spent on Discharge:   30 min  Signed: Joline Maxcy 12/31/2022, 4:57 PM

## 2023-01-01 ENCOUNTER — Encounter: Payer: 59 | Admitting: Urology

## 2023-01-01 ENCOUNTER — Ambulatory Visit: Payer: 59 | Admitting: Urology

## 2023-01-01 ENCOUNTER — Encounter: Payer: Self-pay | Admitting: Urology

## 2023-01-01 VITALS — BP 124/79 | HR 72

## 2023-01-01 DIAGNOSIS — Z09 Encounter for follow-up examination after completed treatment for conditions other than malignant neoplasm: Secondary | ICD-10-CM

## 2023-01-01 DIAGNOSIS — C641 Malignant neoplasm of right kidney, except renal pelvis: Secondary | ICD-10-CM

## 2023-01-01 DIAGNOSIS — Z905 Acquired absence of kidney: Secondary | ICD-10-CM

## 2023-01-01 DIAGNOSIS — N2889 Other specified disorders of kidney and ureter: Secondary | ICD-10-CM

## 2023-01-01 NOTE — Progress Notes (Unsigned)
01/01/2023 2:57 PM   Christina Cameron Apr 23, 1966 [id number]  Referring provider: Lianne Moris, PA-C [address] [place],  [place] [address]  No chief complaint on file.   HPI:  F/u -   1) right kidney cancer - a Jun 2024 MRI shows a large 11 cm right renal mass with renal vein involvement but no IVC involvement. Small retroperitoneal nodes including a 9 mm short axis aortocaval node (series 16/image 81), suspicious in this clinical context. No bone lesions.  A December 2023 MRI of her head was normal.  S/p right robot assisted laparoscopic radical nephrectomy on 12/25/2022.   Pathology revealed a 10 cm clear-cell renal cell carcinoma, with rhabdoid features and vascular invasion, grade 4, renal sinus fat and renal vein invasion but negative margins, no lymph nodes (T3a NX M0).    She has constipation.   NCCN surveillance if no adjuvant therapy -  Comprehensive metabolic panel and other tests as indicated every 3-6 months for 3 years, then annually for up to 5 years, and as clinically indicated thereafter  Abdominal imaging: Baseline abdominal CT or MRI both with and without IV contrast (unless otherwise contraindicated) within 3-6 months, then CT or MRI (preferred), or [place] ([place] is category 2B for stage III), every 3-6 months for at least 3 years and then annually for up to 5 years Imaging beyond 5 years: as clinically indicated  Chest imaging: Baseline chest CT within 3-6 months with continued imaging (CT preferred) every 3-6 months for at least 3 years and then annually for up to 5 years     PMH: Past Medical History:  Diagnosis Date   Arm vein blood clot, unspecified laterality    around 20 years ago as of 08/10/21, Patient doesn't remember what caused it. She was not put on blood thinners.   Arthritis    right knee   Asthma    Cancer (HCC)    cervical 1989   Cancer of kidney (HCC)    Chest pain    a. normal cors by cath in 11/2014 / 03/02/2020 nuclear stress test demonstrated  no perfusion defects consistent with prior infart or current ischemia. Normal study.   Chronic diastolic (congestive) heart failure (HCC)    09/27/20 Echocardiogram in Epic showed LVEF 60 to 65%.   Complication of anesthesia    post-operative nausea & vomiting   COVID-19    05/29/19 & 05/2020 Covid infections   Depression    Diabetes mellitus, type 2 (HCC)    Dysrhythmia    heart palpitations   GERD (gastroesophageal reflux disease)    H/O cardiovascular stress test 03/02/2020   03/02/2020 Nuclear stress test in Epic showed no perusion defects consistent with prior infarct or ischemia - normal study.   Headache    migraines   High cholesterol    pt takes Crestor   History of cardiac radiofrequency ablation    Hypertension    Last cardiology office visit, 10/22/20 with Rennis Harding, NP ( see note in Epic) as of 08/09/21.   Morbid (severe) obesity due to excess calories (HCC)    Neuromuscular disorder (HCC)    severe neuropathy in feet   Osteoarthritis of knee, unspecified    Palpitations    PONV (postoperative nausea and vomiting)    Post-COVID syndrome 05/2019   Pt experienced chest tightness, sob, palpitations & dizziness after Covid infection. See 04/2020 note from Rennis Harding, NP in [place].   PTSD (post-traumatic stress disorder)    Raynaud's syndrome  Sleep apnea 09/24/2020   sleep study indicated moderate sleep apea   SVT (supraventricular tachycardia)    a. s/p ablation by Dr. Ladona Ridgel in 2006.   Wears glasses    prescripton reading glasses    Surgical History: Past Surgical History:  Procedure Laterality Date   CARDIAC CATHETERIZATION N/A 12/14/2014   Procedure: Right/Left Heart Cath and Coronary Angiography;  Surgeon: Tonny Bollman, MD;  Location: [facility] INVASIVE CV LAB;  Service: Cardiovascular;  Laterality: N/A;   CARDIAC ELECTROPHYSIOLOGY STUDY AND ABLATION     around 2006, Patient states that heart rate was up to 180 bpm.   CESAREAN SECTION     x2 1990, 2005    CYSTOSCOPY  08/17/2021   Procedure: CYSTOSCOPY;  Surgeon: Carrington Clamp, MD;  Location: [facility];  Service: Gynecology;;   ENDOMETRIAL ABLATION     HYSTEROSCOPY WITH D & C N/A 03/11/2021   Procedure: DILATATION AND CURETTAGE /HYSTEROSCOPY;  Surgeon: Marlow Baars, MD;  [facility];  Service: Gynecology;  Laterality: N/A;   OPERATIVE ULTRASOUND N/A 03/11/2021   Procedure: OPERATIVE ULTRASOUND;  Surgeon: Marlow Baars, MD;  [facility]: [facility] OR;  Service: Gynecology;  Laterality: N/A;  ultrasound guidance needed   ROBOT ASSISTED LAPAROSCOPIC NEPHRECTOMY Right 12/25/2022   Procedure: XI ROBOTIC ASSISTED LAPAROSCOPIC NEPHRECTOMY;  Surgeon: Malen Gauze, MD;  Location: [facility];  Service: Urology;  Laterality: Right;   ROBOTIC ASSISTED LAPAROSCOPIC HYSTERECTOMY AND SALPINGECTOMY Bilateral 08/17/2021   Procedure: XI ROBOTIC ASSISTED LAPAROSCOPIC HYSTERECTOMY AND  BILATERAL SALPINGECTOMY AND OOPHERETOMY;  Surgeon: Carrington Clamp, MD;  Location[facility];  Service: Gynecology;  Laterality: Bilateral;    Home Medications:  Allergies as of 01/01/2023   No Known Allergies      Medication List        Accurate as of January 01, 2023  2:57 PM. If you have any questions, ask your nurse or doctor.          albuterol 108 (90 Base) MCG/ACT inhaler Commonly known as: VENTOLIN HFA Inhale 1-2 puffs into the lungs every 6 (six) hours as needed for wheezing or shortness of breath.   Azelastine-Fluticasone 137-50 MCG/ACT Susp Place 1 spray into the nose every 12 (twelve) hours.   brexpiprazole 2 MG Tabs tablet Commonly known as: REXULTI Take 2 mg by mouth daily.   buPROPion 150 MG 24 hr tablet Commonly known as: WELLBUTRIN XL Take 150 mg by mouth every morning.   busPIRone 10 MG tablet Commonly known as: BUSPAR Take 10 mg by mouth 2 (two) times daily.   cetirizine 10 MG tablet Commonly known as: ZYRTEC Take 10 mg by mouth daily as needed for  allergies.   estradiol 0.5 MG tablet Commonly known as: ESTRACE Take 0.5 mg by mouth daily.   ezetimibe 10 MG tablet Commonly known as: ZETIA Take 1 tablet (10 mg total) by mouth daily.   furosemide 20 MG tablet Commonly known as: LASIX Take 1 tablet (20 mg total) by mouth daily. What changed:  when to take this reasons to take this   gabapentin 300 MG capsule Commonly known as: NEURONTIN Take 1 capsule (300 mg total) by mouth 3 (three) times daily. Start at bedtime and slowly increase to 3x a day as needed to make sure no side effects like sedation. What changed: when to take this   hydrOXYzine 50 MG tablet Commonly known as: ATARAX Take 100 mg by mouth at bedtime.   ibuprofen 600 MG tablet Commonly known as: ADVIL Take 1  tablet (600 mg total) by mouth every 8 (eight) hours as needed.   indapamide 2.5 MG tablet Commonly known as: LOZOL Take 2.5 mg by mouth daily as needed (fluid).   isosorbide mononitrate 30 MG 24 hr tablet Commonly known as: IMDUR Take 1 tablet (30 mg total) by mouth daily.   lamoTRIgine 200 MG tablet Commonly known as: LAMICTAL Take 200 mg by mouth daily.   metFORMIN 500 MG tablet Commonly known as: GLUCOPHAGE Take 1,000 mg by mouth 2 (two) times daily with a meal.   metoprolol tartrate 50 MG tablet Commonly known as: LOPRESSOR Take 2 tablets (100 mg total) by mouth 2 (two) times daily.   mupirocin ointment 2 % Commonly known as: BACTROBAN Apply 1 Application topically 2 (two) times daily.   nitroGLYCERIN 0.4 MG SL tablet Commonly known as: NITROSTAT DISSOLVE ONE TABLET UNDER THE TONGUE EVERY 5 MINUTES AS NEEDED FOR CHEST PAIN.  DO NOT EXCEED A TOTAL OF 3 DOSES IN 15 MINUTES   oxyCODONE-acetaminophen 5-325 MG tablet Commonly known as: Percocet Take 1 tablet by mouth every 4 (four) hours as needed for severe pain.   Ozempic (2 MG/DOSE) 8 MG/3ML Sopn Generic drug: Semaglutide (2 MG/DOSE) Inject 2 mg into the skin every 7 (seven)  days.   rosuvastatin 40 MG tablet Commonly known as: CRESTOR Take 1 tablet (40 mg total) by mouth daily.   senna 8.6 MG Tabs tablet Commonly known as: SENOKOT Take 1 tablet (8.6 mg total) by mouth daily.   sertraline 100 MG tablet Commonly known as: ZOLOFT Take 200 mg by mouth daily.        Allergies: No Known Allergies  Family History: Family History  Problem Relation Age of Onset   Cancer Mother    Cancer Father    Parkinson's disease Father    Stroke Brother    Hypertension Brother    Cancer - Colon Brother     Social History:  reports that she has never smoked. She has been exposed to tobacco smoke. She has never used smokeless tobacco. She reports current alcohol use. She reports that she does not use drugs.   Physical Exam: BP 124/79   Pulse 72   Constitutional:  Alert and oriented, No acute distress. HEENT: [place] AT, moist mucus membranes.  Trachea midline, no masses. Cardiovascular: No clubbing, cyanosis, or edema. Respiratory: Normal respiratory effort, no increased work of breathing. GI: Abdomen is soft, nontender, nondistended, no abdominal masses; three all superior incisions are clean dry and intact.  Staples were removed and Steri-Strip placed.  The more inferior larger extraction incision is still weeping serous fluid.  It is under some tension, but intact.  Minimal erythema.  No induration or exudate. GU: No CVA tenderness Lymph: No cervical or inguinal lymphadenopathy. Skin: No rashes, bruises or suspicious lesions. Neurologic: Grossly intact, no focal deficits, moving all 4 extremities. Psychiatric: Normal mood and affect.  Laboratory Data: Lab Results  Component Value Date   WBC 10.5 12/27/2022   HGB 10.3 (L) 12/27/2022   HCT 33.3 (L) 12/27/2022   MCV 90.7 12/27/2022   PLT 168 12/27/2022    Lab Results  Component Value Date   CREATININE 0.99 12/30/2022    No results found for: "PSA"  No results found for: "TESTOSTERONE"  Lab Results   Component Value Date   HGBA1C 8.0 (H) 12/15/2022    Urinalysis    Component Value Date/Time   COLORURINE YELLOW 07/07/2018 0812   APPEARANCEUR Clear 11/08/2022 1417   LABSPEC 1.020  07/07/2018 0812   PHURINE 5.5 07/07/2018 0812   GLUCOSEU Negative 11/08/2022 1417   HGBUR NEGATIVE 07/07/2018 0812   BILIRUBINUR Negative 11/08/2022 1417   KETONESUR negative 11/28/2019 1455   KETONESUR TRACE (A) 07/07/2018 0812   PROTEINUR Negative 11/08/2022 1417   PROTEINUR NEGATIVE 07/07/2018 0812   UROBILINOGEN 0.2 11/28/2019 1455   UROBILINOGEN 0.2 04/28/2014 0331   NITRITE Negative 11/08/2022 1417   NITRITE NEGATIVE 07/07/2018 0812   LEUKOCYTESUR Negative 11/08/2022 1417    Lab Results  Component Value Date   LABMICR See below: 11/08/2022   WBCUA 0-5 11/08/2022   LABEPIT >10 (A) 11/08/2022   BACTERIA Moderate (A) 11/08/2022    Pertinent Imaging: Mri dec 2023 and jun 2024 - reports reviewed    Assessment & Plan:    1.  Right renal cell carcinoma with high risk features-discussed pathology with patient and will refer to Dr. Ellin Saba to discuss possible adjuvant treatment.  Follow-up in 1 week for staple removal of the larger incision. F/u with Dr. Ronne Binning in 3 months.  - Suture/Staple  Removal   No follow-ups on file.  Jerilee Field, MD  [facility]  [address] [place], [place] [address] [phone]

## 2023-01-01 NOTE — Progress Notes (Signed)
Patient was seen in office today. 9 staples were remove with out difficult. Preform by Kennyth Lose, CMA

## 2023-01-02 ENCOUNTER — Telehealth: Payer: Self-pay

## 2023-01-02 NOTE — Telephone Encounter (Signed)
Spoke with husband concerning patients triage call. Husband states that patient spiked a fever with chills after she was given miralax powder the night before. Husband states that patient is much better this morning and her temp came down to 97.1 Husband informed if patient spikes another fever to call office and if we are closed to go to ER. Husband voiced understanding.

## 2023-01-03 ENCOUNTER — Telehealth: Payer: Self-pay

## 2023-01-03 NOTE — Telephone Encounter (Signed)
Patient called complaining of right flank pain. Denied incision tenderness. Patient states the pain feels like it did prior to having the surgery. Symptoms reviewed with MD and requested for patient to come in today and be seen.  Patient offered appointment and declined. Patient states if the pain gets worse she will call office back.   Patient informed that office will be closed tomorrow and Friday. Patient voiced understanding and states that if needed she would go to ER if office was closed.

## 2023-01-08 ENCOUNTER — Encounter: Payer: Self-pay | Admitting: Urology

## 2023-01-08 ENCOUNTER — Ambulatory Visit: Payer: 59 | Admitting: Urology

## 2023-01-08 VITALS — BP 135/85 | HR 79

## 2023-01-08 DIAGNOSIS — T8189XA Other complications of procedures, not elsewhere classified, initial encounter: Secondary | ICD-10-CM | POA: Diagnosis not present

## 2023-01-08 DIAGNOSIS — N2889 Other specified disorders of kidney and ureter: Secondary | ICD-10-CM

## 2023-01-08 NOTE — Progress Notes (Signed)
History of Present Illness: Christina Cameron is a 57 y.o. year old female who comes in today for postoperative follow-up.  She underwent robotic assisted right radical nephrectomy by Dr. Ronne Binning on 7.29.2024.  Stayed in the hospital for 4 to 5 days.  At home, has had persistent drainage from her port site of kidney extraction.  She has had some right greater than left abdominal pain and feels bloated.  Drainage has been serous in nature.  No recent fever.  Appetite fairly good, bowels doing well.  Past Medical History:  Diagnosis Date   Arm vein blood clot, unspecified laterality    around 20 years ago as of 08/10/21, Patient doesn't remember what caused it. She was not put on blood thinners.   Arthritis    right knee   Asthma    Cancer (HCC)    cervical 1989   Cancer of kidney (HCC)    Chest pain    a. normal cors by cath in 11/2014 / 03/02/2020 nuclear stress test demonstrated no perfusion defects consistent with prior infart or current ischemia. Normal study.   Chronic diastolic (congestive) heart failure (HCC)    09/27/20 Echocardiogram in Epic showed LVEF 60 to 65%.   Complication of anesthesia    post-operative nausea & vomiting   COVID-19    05/29/19 & 05/2020 Covid infections   Depression    Diabetes mellitus, type 2 (HCC)    Dysrhythmia    heart palpitations   GERD (gastroesophageal reflux disease)    H/O cardiovascular stress test 03/02/2020   03/02/2020 Nuclear stress test in Epic showed no perusion defects consistent with prior infarct or ischemia - normal study.   Headache    migraines   High cholesterol    pt takes Crestor   History of cardiac radiofrequency ablation    Hypertension    Last cardiology office visit, 10/22/20 with Rennis Harding, NP ( see note in Epic) as of 08/09/21.   Morbid (severe) obesity due to excess calories (HCC)    Neuromuscular disorder (HCC)    severe neuropathy in feet   Osteoarthritis of knee, unspecified    Palpitations    PONV (postoperative  nausea and vomiting)    Post-COVID syndrome 05/2019   Pt experienced chest tightness, sob, palpitations & dizziness after Covid infection. See 04/2020 note from Rennis Harding, NP in [place].   PTSD (post-traumatic stress disorder)    Raynaud's syndrome    Sleep apnea 09/24/2020   sleep study indicated moderate sleep apea   SVT (supraventricular tachycardia)    a. s/p ablation by Dr. Ladona Ridgel in 2006.   Wears glasses    prescripton reading glasses    Past Surgical History:  Procedure Laterality Date   CARDIAC CATHETERIZATION N/A 12/14/2014   Procedure: Right/Left Heart Cath and Coronary Angiography;  Surgeon: Tonny Bollman, MD;  Location: [facility] INVASIVE CV LAB;  Service: Cardiovascular;  Laterality: N/A;   CARDIAC ELECTROPHYSIOLOGY STUDY AND ABLATION     around 2006, Patient states that heart rate was up to 180 bpm.   CESAREAN SECTION     x2 1990, 2005   CYSTOSCOPY  08/17/2021   Procedure: CYSTOSCOPY;  Surgeon: Carrington Clamp, MD;  Location: [facility];  Service: Gynecology;;   ENDOMETRIAL ABLATION     HYSTEROSCOPY WITH D & C N/A 03/11/2021   Procedure: DILATATION AND CURETTAGE /HYSTEROSCOPY;  Surgeon: Marlow Baars, MD;  Location[facility];  Service: Gynecology;  Laterality: N/A;   OPERATIVE ULTRASOUND N/A 03/11/2021  Procedure: OPERATIVE ULTRASOUND;  Surgeon: Marlow Baars, MD;  Location: [facility];  Service: Gynecology;  Laterality: N/A;  ultrasound guidance needed   ROBOT ASSISTED LAPAROSCOPIC NEPHRECTOMY Right 12/25/2022   Procedure: XI ROBOTIC ASSISTED LAPAROSCOPIC NEPHRECTOMY;  Surgeon: Malen Gauze, MD;  Location: [facility];  Service: Urology;  Laterality: Right;   ROBOTIC ASSISTED LAPAROSCOPIC HYSTERECTOMY AND SALPINGECTOMY Bilateral 08/17/2021   Procedure: XI ROBOTIC ASSISTED LAPAROSCOPIC HYSTERECTOMY AND  BILATERAL SALPINGECTOMY AND OOPHERETOMY;  Surgeon: Carrington Clamp, MD;  [facility];  Service: Gynecology;  Laterality: Bilateral;     Home Medications:  (Not in a hospital admission)   Allergies: No Known Allergies  Family History  Problem Relation Age of Onset   Cancer Mother    Cancer Father    Parkinson's disease Father    Stroke Brother    Hypertension Brother    Cancer - Colon Brother     Social History:  reports that she has never smoked. She has been exposed to tobacco smoke. She has never used smokeless tobacco. She reports current alcohol use. She reports that she does not use drugs.  ROS: A complete review of systems was performed.  All systems are negative except for pertinent findings as noted.  Physical Exam:  Vital signs in last 24 hours: @VSRANGES @ General:  Alert and oriented, No acute distress HEENT: Normocephalic, atraumatic Neck: No JVD or lymphadenopathy Cardiovascular: Regular rate  Lungs: Normal inspiratory/expiratory excursion Abdomen: Obese.  All incisions appear to be healing well.  No surrounding erythema.  There is mild right abdominal tenderness.  No significant swelling on that side. Extremities: No edema Neurologic: Grossly intact  I have reviewed prior pt notes  I have reviewed urinalysis results  I have independently reviewed the pathology results with patient-clear-cell carcinoma with rhabdoid features.  I have reviewed prior urine culture   Impression/Assessment:  2 weeks out from right radical nephrectomy with persistent wound drainage.  Rule out fascial dehiscence versus abdominal fluid collection  Plan:  I will send her out for noncontrasted CT abdomen and pelvis, call with results  Bertram Millard  01/08/2023, 4:42 PM  Bertram Millard.  MD

## 2023-01-09 ENCOUNTER — Ambulatory Visit (HOSPITAL_COMMUNITY)
Admission: RE | Admit: 2023-01-09 | Discharge: 2023-01-09 | Disposition: A | Discharge status: Home / Self Care | Payer: 59 | Source: Ambulatory Visit | Attending: Urology | Admitting: Urology

## 2023-01-09 DIAGNOSIS — Z905 Acquired absence of kidney: Secondary | ICD-10-CM | POA: Diagnosis not present

## 2023-01-09 DIAGNOSIS — K439 Ventral hernia without obstruction or gangrene: Secondary | ICD-10-CM | POA: Diagnosis not present

## 2023-01-09 DIAGNOSIS — N2889 Other specified disorders of kidney and ureter: Secondary | ICD-10-CM | POA: Diagnosis not present

## 2023-01-09 DIAGNOSIS — N200 Calculus of kidney: Secondary | ICD-10-CM | POA: Diagnosis not present

## 2023-01-09 MED ORDER — IOHEXOL 9 MG/ML PO SOLN
ORAL | Status: AC
  Filled 2023-01-09: qty 500

## 2023-01-10 DIAGNOSIS — C649 Malignant neoplasm of unspecified kidney, except renal pelvis: Secondary | ICD-10-CM | POA: Diagnosis not present

## 2023-01-10 DIAGNOSIS — K439 Ventral hernia without obstruction or gangrene: Secondary | ICD-10-CM | POA: Diagnosis not present

## 2023-01-10 DIAGNOSIS — I10 Essential (primary) hypertension: Secondary | ICD-10-CM | POA: Diagnosis not present

## 2023-01-10 DIAGNOSIS — E1142 Type 2 diabetes mellitus with diabetic polyneuropathy: Secondary | ICD-10-CM | POA: Diagnosis not present

## 2023-01-11 ENCOUNTER — Telehealth: Payer: 59 | Admitting: Nurse Practitioner

## 2023-01-11 ENCOUNTER — Encounter: Payer: Self-pay | Admitting: Urology

## 2023-01-11 DIAGNOSIS — T8149XA Infection following a procedure, other surgical site, initial encounter: Secondary | ICD-10-CM

## 2023-01-11 MED ORDER — SULFAMETHOXAZOLE-TRIMETHOPRIM 800-160 MG PO TABS: 1.0000 | ORAL_TABLET | Freq: Two times a day (BID) | ORAL | 0 refills | Status: DC

## 2023-01-11 NOTE — Telephone Encounter (Signed)
I called patient and notified of CT results per Dr Retta Diones.  Patient is upset that MD did not call patient to review CT. Patient reports she reviewed her CT on my chart and states she has a hernia after surgery. Patient is upset due to the hernia being present. Patient also reports incision site is still draining yellow/gray pus and is swollen.   I discussed with patient that CT results showed no acute signs per Dr. Retta Diones but we can offer an appointment to be seen again to rule out any incision site/skin infection with staples. Patient wishes to call another urologist first before being seen again in our office. I did let patient know we would need to remove her staples are per Dr. Retta Diones. We would change her upcoming visit to see Maralyn Sago to have them removed.  Patient states she will call back to schedule.

## 2023-01-11 NOTE — Progress Notes (Signed)
Because this is related to surgery you need to contact your surgeon, If you cannot get treatment from the surgeon's office you can visit a local Urgent Care   I feel your condition warrants further evaluation and I recommend that you be seen in a face to face visit.   NOTE: There will be NO CHARGE for this eVisit   If you are having a true medical emergency please call 911.      For an urgent face to face visit, [place] has eight urgent care centers for your convenience:   NEW!! [facility] Health Urgent Care Center at [facility] Get Driving Directions [phone] [address] [place], [address]    [facility] Health Urgent Care Center at [facility] Get Driving Directions [phone] [address] [place], [place] [address]   [facility] Health Urgent Care Center [facility]) Get Driving Directions [phone] [address] [place], [place] [address]  [facility] Health Urgent Care Center [facility]) Get Driving Directions [phone] [address] [place],  [place]  [address]  [facility] Health Urgent Care Center [facility] - at [employer]  [phone] [address] [place],  [place] [address]   [facility] Health Urgent Care at [facility] Get Driving Directions [phone] [address] [place], [place] [address]   [facility] Health Urgent Care at [facility] Get Driving Directions  [phone] [address] [place], [place] [address]   [facility] Health Urgent Care at [facility] Directions [phone] [address] [place], [place] [address]  Your MyChart E-visit questionnaire answers were reviewed by a board certified advanced clinical practitioner to complete your personal care plan based on your specific symptoms.  Thank you for using e-Visits.

## 2023-01-11 NOTE — Telephone Encounter (Signed)
-----   Message from Bertram Millard Dahlstedt sent at 01/10/2023 12:33 PM EDT ----- Let patient know that nothing significant showed up on the CT scan, I would recommend her coming back in in about a week to have Sarah take staples out. ----- Message ----- From: Troy Sine, CMA Sent: 01/10/2023   9:26 AM EDT To: Marcine Matar, MD  Please review

## 2023-01-12 ENCOUNTER — Telehealth: Payer: Self-pay

## 2023-01-12 NOTE — Telephone Encounter (Signed)
Patient called office this am wanting to cancel her appointment scheduled for Monday.patient reports she wishes to go out of town with her husband and would be gone for two weeks.  I discussed with patient importance of keeping appointment scheduled with Dr. Mena Goes for Monday. Patient will keep scheduled appointment for Monday and cancel [place] for Wednesday for patient.

## 2023-01-15 ENCOUNTER — Inpatient Hospital Stay: Payer: 59 | Admitting: Hematology

## 2023-01-15 ENCOUNTER — Ambulatory Visit: Payer: 59

## 2023-01-15 DIAGNOSIS — K469 Unspecified abdominal hernia without obstruction or gangrene: Secondary | ICD-10-CM

## 2023-01-15 DIAGNOSIS — Z4802 Encounter for removal of sutures: Secondary | ICD-10-CM

## 2023-01-15 NOTE — Progress Notes (Signed)
Patient here today for staple removal.  Discussed case today with Dr. Mena Goes who reviewed patient CT and stated not to remove staples due to size of hernia and needs a general surgery consult for hernia and to see Dr. Ronne Binning this Wednesday.  Reviewed this with patient who agrees to see general surgeon but requested a [place] however, per patient not able to make appointment this Wednesday. Patient will be out of town until next week. Discussed this with Dr. Mena Goes who recommends patient use abdominal binder until seen and if wounds opens at all to get to nearest ER.  Informed patient of advice who voiced understanding.  Staples are intact with no drainage present. Patient denies fever, patient denies any abdominal pain. Reports she had BM this am. Appointment scheduled with Dr. Ronne Binning upon patient return into town.    Also discussed patient with Dr. Ronne Binning who was paged. Orders to refer patient to general surgery. Patient preference [place].

## 2023-01-15 NOTE — Patient Instructions (Signed)
Per Dr. Mena Goes recommendation We will leave staples in for 2 more week. General surgery referral for hernia See Dr. Ronne Binning this week.  Abdominal binder to help with hernia until seen by general surgery. If staples/wound opens will need to get to nearest ER. Increase fluids to help with constipation and stool softner.

## 2023-01-16 ENCOUNTER — Ambulatory Visit: Payer: 59 | Admitting: Podiatry

## 2023-01-17 ENCOUNTER — Ambulatory Visit: Payer: 59

## 2023-01-18 ENCOUNTER — Telehealth: Payer: Self-pay

## 2023-01-18 NOTE — Telephone Encounter (Signed)
Patient calling and left a vm message about getting in the swimming pool. Patient is advised to not get in any swimming pool or hot tube. Patient voiced understanding

## 2023-01-26 ENCOUNTER — Encounter: Payer: Self-pay | Admitting: Urology

## 2023-01-26 ENCOUNTER — Ambulatory Visit (INDEPENDENT_AMBULATORY_CARE_PROVIDER_SITE_OTHER): Payer: 59 | Admitting: Urology

## 2023-01-26 VITALS — BP 135/82 | HR 90

## 2023-01-26 DIAGNOSIS — C641 Malignant neoplasm of right kidney, except renal pelvis: Secondary | ICD-10-CM

## 2023-01-26 MED ORDER — CLOTRIMAZOLE-BETAMETHASONE 1-0.05 % EX CREA: 1.0000 | TOPICAL_CREAM | Freq: Two times a day (BID) | CUTANEOUS | 3 refills | Status: DC

## 2023-01-26 NOTE — Progress Notes (Signed)
01/26/2023 9:20 AM   Christina Cameron [birth year] [id number]  Referring provider: Lianne Moris, PA-C [address] [place],  [place] [address]  Followup right RCC   HPI: Christina Cameron is a 57yo here for followup for right RCC s/p nephrectomy. Pathology T3 RCC negative margins. She developed a incisional hernia from the kidney extraction site. She has an appointment with [facility] for her hernia. She is scheduled to see medical oncology for consideration of Keytruda.    PMH: Past Medical History:  Diagnosis Date   Arm vein blood clot, unspecified laterality    around 20 years ago as of 08/10/21, Patient doesn't remember what caused it. She was not put on blood thinners.   Arthritis    right knee   Asthma    Cancer (HCC)    cervical 1989   Cancer of kidney (HCC)    Chest pain    a. normal cors by cath in 11/2014 / 03/02/2020 nuclear stress test demonstrated no perfusion defects consistent with prior infart or current ischemia. Normal study.   Chronic diastolic (congestive) heart failure (HCC)    09/27/20 Echocardiogram in Epic showed LVEF 60 to 65%.   Complication of anesthesia    post-operative nausea & vomiting   COVID-19    05/29/19 & 05/2020 Covid infections   Depression    Diabetes mellitus, type 2 (HCC)    Dysrhythmia    heart palpitations   GERD (gastroesophageal reflux disease)    H/O cardiovascular stress test 03/02/2020   03/02/2020 Nuclear stress test in Epic showed no perusion defects consistent with prior infarct or ischemia - normal study.   Headache    migraines   High cholesterol    pt takes Crestor   History of cardiac radiofrequency ablation    Hypertension    Last cardiology office visit, 10/22/20 with Rennis Harding, NP ( see note in Epic) as of 08/09/21.   Morbid (severe) obesity due to excess calories (HCC)    Neuromuscular disorder (HCC)    severe neuropathy in feet   Osteoarthritis of knee, unspecified    Palpitations    PONV (postoperative nausea and  vomiting)    Post-COVID syndrome 05/2019   Pt experienced chest tightness, sob, palpitations & dizziness after Covid infection. See 04/2020 note from Rennis Harding, NP in [place].   PTSD (post-traumatic stress disorder)    Raynaud's syndrome    Sleep apnea 09/24/2020   sleep study indicated moderate sleep apea   SVT (supraventricular tachycardia)    a. s/p ablation by Dr. Ladona Ridgel in 2006.   Wears glasses    prescripton reading glasses    Surgical History: Past Surgical History:  Procedure Laterality Date   CARDIAC CATHETERIZATION N/A 12/14/2014   Procedure: Right/Left Heart Cath and Coronary Angiography;  Surgeon: Tonny Bollman, MD;  Location: [facility] INVASIVE CV LAB;  Service: Cardiovascular;  Laterality: N/A;   CARDIAC ELECTROPHYSIOLOGY STUDY AND ABLATION     around 2006, Patient states that heart rate was up to 180 bpm.   CESAREAN SECTION     x2 1990, 2005   CYSTOSCOPY  08/17/2021   Procedure: CYSTOSCOPY;  Surgeon: Carrington Clamp, MD;  Location: [facility];  Service: Gynecology;;   ENDOMETRIAL ABLATION     HYSTEROSCOPY WITH D & C N/A 03/11/2021   Procedure: DILATATION AND CURETTAGE /HYSTEROSCOPY;  Surgeon: Marlow Baars, MD;  Location: [facility] OR;  Service: Gynecology;  Laterality: N/A;   OPERATIVE ULTRASOUND N/A 03/11/2021   Procedure: OPERATIVE ULTRASOUND;  Surgeon: Marlow Baars, MD;  Location: [facility] OR;  Service: Gynecology;  Laterality: N/A;  ultrasound guidance needed   ROBOT ASSISTED LAPAROSCOPIC NEPHRECTOMY Right 12/25/2022   Procedure: XI ROBOTIC ASSISTED LAPAROSCOPIC NEPHRECTOMY;  Surgeon: Christina Gauze, MD;  Location: [facility] ORS;  Service: Urology;  Laterality: Right;   ROBOTIC ASSISTED LAPAROSCOPIC HYSTERECTOMY AND SALPINGECTOMY Bilateral 08/17/2021   Procedure: XI ROBOTIC ASSISTED LAPAROSCOPIC HYSTERECTOMY AND  BILATERAL SALPINGECTOMY AND OOPHERETOMY;  Surgeon: Carrington Clamp, MD;  Location: [facility];  Service: Gynecology;  Laterality: Bilateral;     Home Medications:  Allergies as of 01/26/2023   No Known Allergies      Medication List        Accurate as of January 26, 2023  9:20 AM. If you have any questions, ask your nurse or doctor.          albuterol 108 (90 Base) MCG/ACT inhaler Commonly known as: VENTOLIN HFA Inhale 1-2 puffs into the lungs every 6 (six) hours as needed for wheezing or shortness of breath.   Azelastine-Fluticasone 137-50 MCG/ACT Susp Place 1 spray into the nose every 12 (twelve) hours.   brexpiprazole 2 MG Tabs tablet Commonly known as: REXULTI Take 2 mg by mouth daily.   buPROPion 150 MG 24 hr tablet Commonly known as: WELLBUTRIN XL Take 150 mg by mouth every morning.   busPIRone 10 MG tablet Commonly known as: BUSPAR Take 10 mg by mouth 2 (two) times daily.   cetirizine 10 MG tablet Commonly known as: ZYRTEC Take 10 mg by mouth daily as needed for allergies.   estradiol 0.5 MG tablet Commonly known as: ESTRACE Take 0.5 mg by mouth daily.   ezetimibe 10 MG tablet Commonly known as: ZETIA Take 1 tablet (10 mg total) by mouth daily.   furosemide 20 MG tablet Commonly known as: LASIX Take 1 tablet (20 mg total) by mouth daily. What changed:  when to take this reasons to take this   gabapentin 300 MG capsule Commonly known as: NEURONTIN Take 1 capsule (300 mg total) by mouth 3 (three) times daily. Start at bedtime and slowly increase to 3x a day as needed to make sure no side effects like sedation. What changed: when to take this   hydrOXYzine 50 MG tablet Commonly known as: ATARAX Take 100 mg by mouth at bedtime.   ibuprofen 600 MG tablet Commonly known as: ADVIL Take 1 tablet (600 mg total) by mouth every 8 (eight) hours as needed.   indapamide 2.5 MG tablet Commonly known as: LOZOL Take 2.5 mg by mouth daily as needed (fluid).   isosorbide mononitrate 30 MG 24 hr tablet Commonly known as: IMDUR Take 1 tablet (30 mg total) by mouth daily.   lamoTRIgine 200 MG  tablet Commonly known as: LAMICTAL Take 200 mg by mouth daily.   metFORMIN 500 MG tablet Commonly known as: GLUCOPHAGE Take 1,000 mg by mouth 2 (two) times daily with a meal.   metoprolol tartrate 50 MG tablet Commonly known as: LOPRESSOR Take 2 tablets (100 mg total) by mouth 2 (two) times daily.   mupirocin ointment 2 % Commonly known as: BACTROBAN Apply 1 Application topically 2 (two) times daily.   nitroGLYCERIN 0.4 MG SL tablet Commonly known as: NITROSTAT DISSOLVE ONE TABLET UNDER THE TONGUE EVERY 5 MINUTES AS NEEDED FOR CHEST PAIN.  DO NOT EXCEED A TOTAL OF 3 DOSES IN 15 MINUTES   oxyCODONE-acetaminophen 5-325 MG tablet Commonly known as: Percocet Take 1 tablet by mouth every 4 (four)  hours as needed for severe pain.   Ozempic (2 MG/DOSE) 8 MG/3ML Sopn Generic drug: Semaglutide (2 MG/DOSE) Inject 2 mg into the skin every 7 (seven) days.   rosuvastatin 40 MG tablet Commonly known as: CRESTOR Take 1 tablet (40 mg total) by mouth daily.   senna 8.6 MG Tabs tablet Commonly known as: SENOKOT Take 1 tablet (8.6 mg total) by mouth daily.   sertraline 100 MG tablet Commonly known as: ZOLOFT Take 200 mg by mouth daily.   sulfamethoxazole-trimethoprim 800-160 MG tablet Commonly known as: BACTRIM DS Take 1 tablet by mouth 2 (two) times daily.        Allergies: No Known Allergies  Family History: Family History  Problem Relation Age of Onset   Cancer Mother    Cancer Father    Parkinson's disease Father    Stroke Brother    Hypertension Brother    Cancer - Colon Brother     Social History:  reports that she has never smoked. She has been exposed to tobacco smoke. She has never used smokeless tobacco. She reports current alcohol use. She reports that she does not use drugs.  ROS: All other review of systems were reviewed and are negative except what is noted above in HPI  Physical Exam: BP 135/82   Pulse 90   Constitutional:  Alert and oriented, No acute  distress. HEENT: [place] AT, moist mucus membranes.  Trachea midline, no masses. Cardiovascular: No clubbing, cyanosis, or edema. Respiratory: Normal respiratory effort, no increased work of breathing. GI: Abdomen is soft, nontender, nondistended, no abdominal masses GU: No CVA tenderness.  Lymph: No cervical or inguinal lymphadenopathy. Skin: No rashes, bruises or suspicious lesions. Neurologic: Grossly intact, no focal deficits, moving all 4 extremities. Psychiatric: Normal mood and affect.  Laboratory Data: Lab Results  Component Value Date   WBC 10.5 12/27/2022   HGB 10.3 (L) 12/27/2022   HCT 33.3 (L) 12/27/2022   MCV 90.7 12/27/2022   PLT 168 12/27/2022    Lab Results  Component Value Date   CREATININE 0.99 12/30/2022    No results found for: "PSA"  No results found for: "TESTOSTERONE"  Lab Results  Component Value Date   HGBA1C 8.0 (H) 12/15/2022    Urinalysis    Component Value Date/Time   COLORURINE YELLOW 07/07/2018 0812   APPEARANCEUR Clear 11/08/2022 1417   LABSPEC 1.020 07/07/2018 0812   PHURINE 5.5 07/07/2018 0812   GLUCOSEU Negative 11/08/2022 1417   HGBUR NEGATIVE 07/07/2018 0812   BILIRUBINUR Negative 11/08/2022 1417   KETONESUR negative 11/28/2019 1455   KETONESUR TRACE (A) 07/07/2018 0812   PROTEINUR Negative 11/08/2022 1417   PROTEINUR NEGATIVE 07/07/2018 0812   UROBILINOGEN 0.2 11/28/2019 1455   UROBILINOGEN 0.2 04/28/2014 0331   NITRITE Negative 11/08/2022 1417   NITRITE NEGATIVE 07/07/2018 0812   LEUKOCYTESUR Negative 11/08/2022 1417    Lab Results  Component Value Date   LABMICR See below: 11/08/2022   WBCUA 0-5 11/08/2022   LABEPIT >10 (A) 11/08/2022   BACTERIA Moderate (A) 11/08/2022    Pertinent Imaging: CT 01/09/2023: Images reviewed and discussed with th epatient  No results found for this or any previous visit.  No results found for this or any previous visit.  No results found for this or any previous visit.  No results  found for this or any previous visit.  No results found for this or any previous visit.  No valid procedures specified. No results found for this or any previous visit.  No results found for this or any previous visit.   Assessment & Plan:    1. Renal cell carcinoma of right kidney (HCC) -followup 3 months with CXR and CMP   No follow-ups on file.  Wilkie Aye, MD  [facility] [place]

## 2023-01-26 NOTE — Patient Instructions (Signed)
Kidney Cancer  Kidney cancer is a type of cancer in which an abnormal growth of cells (tumor) forms in one or both kidneys. The kidneys filter waste from your blood and make urine. Kidney cancer may spread to other parts of your body. This type of cancer may also be called renal cell carcinoma. What are the causes? The cause of this condition is not known. What increases the risk? You may be more likely to develop kidney cancer if: You are over age 60. You have certain conditions that are passed from parent to child (inherited). These include von Hippel-Lindau disease, tuberous sclerosis, and hereditary papillary renal carcinoma. You smoke or have been exposed to certain chemicals. You have advanced kidney disease, especially if you need to use a machine to clean your blood (dialysis). You are obese. You have high blood pressure (hypertension). You are female. What are the signs or symptoms? At first, kidney cancer may not cause any symptoms. As the cancer grows, symptoms may include: Blood in the urine. Pain in the upper back or abdomen, just below the rib cage. You may feel pain on one or both sides of your body. Tiredness (fatigue). Weight loss that cannot be explained. Fever. How is this diagnosed? This condition may be diagnosed based on: Your symptoms. Your medical history. A physical exam. You may also have tests, such as: Blood and urine tests. X-rays. Imaging tests, such as CT scans, MRIs, and PET scans. Angiogram. This is when dye is injected into your blood to show your blood vessels. Intravenous pyelogram. This is when dye is injected into your blood to show your kidneys and the other organs that help with making and storing urine. Biopsy. This is when a piece of tissue is removed from your kidney and looked at under a microscope. Your cancer will be assessed (staged), based on how severe it is and how much it has spread. How is this treated? Treatment depends on the type  and stage of the cancer. Treatment may include: Surgery to remove: Just the tumor (nephron-sparing surgery). The entire kidney (nephrectomy). The kidney, some of the healthy tissue around it, nearby lymph nodes, and sometimes the adrenal gland (radical nephrectomy). Medicines to: Kill cancer cells (chemotherapy). Help your body's disease-fighting system (immune system) fight cancer cells. This is known as immunotherapy. Radiation therapy. This uses high-energy rays to kill cancer cells. Targeted therapy to only attack genes and proteins that allow a tumor to grow. This type of therapy tries to limit damage to your healthy cells. Cryoablation. This uses gas or liquid to freeze cancer cells. It is sent through a needle. Radiofrequency ablation. This uses high-energy radio waves to destroy cancer cells. It is sent through a needle-like probe. Embolization. This is a procedure to block the artery that supplies blood to the tumor. Follow these instructions at home: Eating and drinking Some of your treatments may affect your appetite and your ability to chew and swallow. If you have problems eating, or if you do not have an appetite, meet with an expert in diet and nutrition (dietitian). If you have side effects that affect eating, it may help to: Eat smaller meals more often. Eat bland or soft foods. Avoid foods that are hot, spicy, or hard to swallow. Drink shakes or supplements that are high in nutrition and calories. Do not drink alcohol. Lifestyle Do not use any products that contain nicotine or tobacco. These products include cigarettes, chewing tobacco, and vaping devices, such as e-cigarettes. If you need   help quitting, ask your health care provider. Get enough sleep. Most adults need 6-8 hours of sleep each night. During treatment, you may need more sleep. General instructions Take over-the-counter and prescription medicines only as told by your health care provider. Consider joining a  support group. This can help you cope with the stress of having kidney cancer. Work with your health care provider to manage any side effects of treatment. Keep all follow-up visits. Your health care provider will want to make sure your treatment is working. Where to find more information American Cancer Society: cancer.org National Cancer Institute (NCI): cancer.gov Contact a health care provider if: You bruise or bleed easily. You lose weight without trying. You have new or worse fatigue or weakness. You have a fever. Your pain suddenly increases. You have more blood in your urine. Your skin or the white parts of your eyes turn yellow (jaundice). Get help right away if: You have chest pain. You are short of breath. You have irregular heartbeats (palpitations). These symptoms may be an emergency. Get help right away. Call 911. Do not wait to see if the symptoms will go away. Do not drive yourself to the hospital. This information is not intended to replace advice given to you by your health care provider. Make sure you discuss any questions you have with your health care provider. Document Revised: 10/25/2021 Document Reviewed: 10/25/2021 Elsevier Patient Education  2024 [employer]vier [employer].  

## 2023-01-26 NOTE — Addendum Note (Signed)
Addended by: Malen Gauze on: 01/26/2023 09:30 AM   Modules accepted: Orders

## 2023-01-28 ENCOUNTER — Encounter: Payer: Self-pay | Admitting: Internal Medicine

## 2023-01-30 ENCOUNTER — Encounter (HOSPITAL_COMMUNITY): Admission: RE | Admit: 2023-01-30 | Payer: 59 | Source: Ambulatory Visit

## 2023-01-30 ENCOUNTER — Encounter: Payer: Self-pay | Admitting: Internal Medicine

## 2023-01-30 ENCOUNTER — Inpatient Hospital Stay: Payer: 59 | Attending: Internal Medicine | Admitting: Internal Medicine

## 2023-01-30 ENCOUNTER — Inpatient Hospital Stay: Payer: 59

## 2023-01-30 ENCOUNTER — Other Ambulatory Visit: Payer: Self-pay | Admitting: Medical Oncology

## 2023-01-30 VITALS — BP 137/81 | HR 86 | Temp 99.7°F | Resp 17 | Ht 64.0 in | Wt 264.0 lb

## 2023-01-30 DIAGNOSIS — C641 Malignant neoplasm of right kidney, except renal pelvis: Secondary | ICD-10-CM | POA: Diagnosis not present

## 2023-01-30 DIAGNOSIS — Z79899 Other long term (current) drug therapy: Secondary | ICD-10-CM | POA: Insufficient documentation

## 2023-01-30 DIAGNOSIS — K219 Gastro-esophageal reflux disease without esophagitis: Secondary | ICD-10-CM | POA: Diagnosis not present

## 2023-01-30 DIAGNOSIS — J45909 Unspecified asthma, uncomplicated: Secondary | ICD-10-CM | POA: Diagnosis not present

## 2023-01-30 DIAGNOSIS — F32A Depression, unspecified: Secondary | ICD-10-CM | POA: Insufficient documentation

## 2023-01-30 DIAGNOSIS — I110 Hypertensive heart disease with heart failure: Secondary | ICD-10-CM | POA: Insufficient documentation

## 2023-01-30 DIAGNOSIS — G4730 Sleep apnea, unspecified: Secondary | ICD-10-CM | POA: Insufficient documentation

## 2023-01-30 DIAGNOSIS — E119 Type 2 diabetes mellitus without complications: Secondary | ICD-10-CM | POA: Insufficient documentation

## 2023-01-30 DIAGNOSIS — E7800 Pure hypercholesterolemia, unspecified: Secondary | ICD-10-CM | POA: Diagnosis not present

## 2023-01-30 DIAGNOSIS — Z8541 Personal history of malignant neoplasm of cervix uteri: Secondary | ICD-10-CM | POA: Insufficient documentation

## 2023-01-30 DIAGNOSIS — C649 Malignant neoplasm of unspecified kidney, except renal pelvis: Secondary | ICD-10-CM

## 2023-01-30 DIAGNOSIS — I7300 Raynaud's syndrome without gangrene: Secondary | ICD-10-CM | POA: Diagnosis not present

## 2023-01-30 DIAGNOSIS — F4310 Post-traumatic stress disorder, unspecified: Secondary | ICD-10-CM | POA: Diagnosis not present

## 2023-01-30 LAB — CBC WITH DIFFERENTIAL (CANCER CENTER ONLY)
Abs Immature Granulocytes: 0.03 10*3/uL (ref 0.00–0.07)
Basophils Absolute: 0.1 10*3/uL (ref 0.0–0.1)
Basophils Relative: 1 %
Eosinophils Absolute: 0.7 10*3/uL — ABNORMAL HIGH (ref 0.0–0.5)
Eosinophils Relative: 10 %
HCT: 37.8 % (ref 36.0–46.0)
Hemoglobin: 12.2 g/dL (ref 12.0–15.0)
Immature Granulocytes: 0 %
Lymphocytes Relative: 30 %
Lymphs Abs: 2.1 10*3/uL (ref 0.7–4.0)
MCH: 26.9 pg (ref 26.0–34.0)
MCHC: 32.3 g/dL (ref 30.0–36.0)
MCV: 83.4 fL (ref 80.0–100.0)
Monocytes Absolute: 0.6 10*3/uL (ref 0.1–1.0)
Monocytes Relative: 9 %
Neutro Abs: 3.6 10*3/uL (ref 1.7–7.7)
Neutrophils Relative %: 50 %
Platelet Count: 214 10*3/uL (ref 150–400)
RBC: 4.53 MIL/uL (ref 3.87–5.11)
RDW: 13.8 % (ref 11.5–15.5)
WBC Count: 7.1 10*3/uL (ref 4.0–10.5)
nRBC: 0 % (ref 0.0–0.2)

## 2023-01-30 LAB — CMP (CANCER CENTER ONLY)
ALT: 20 U/L (ref 0–44)
AST: 21 U/L (ref 15–41)
Albumin: 4 g/dL (ref 3.5–5.0)
Alkaline Phosphatase: 50 U/L (ref 38–126)
Anion gap: 8 (ref 5–15)
BUN: 16 mg/dL (ref 6–20)
CO2: 25 mmol/L (ref 22–32)
Calcium: 8.9 mg/dL (ref 8.9–10.3)
Chloride: 104 mmol/L (ref 98–111)
Creatinine: 0.9 mg/dL (ref 0.44–1.00)
GFR, Estimated: >60 mL/min (ref >60)
Glucose, Bld: 262 mg/dL — ABNORMAL HIGH (ref 70–99)
Potassium: 4.4 mmol/L (ref 3.5–5.1)
Sodium: 137 mmol/L (ref 135–145)
Total Bilirubin: 0.4 mg/dL (ref 0.3–1.2)
Total Protein: 7 g/dL (ref 6.5–8.1)

## 2023-01-30 MED ORDER — PROCHLORPERAZINE MALEATE 10 MG PO TABS: 10.0000 mg | ORAL_TABLET | Freq: Four times a day (QID) | ORAL | 1 refills | Status: DC | PRN

## 2023-01-30 NOTE — Progress Notes (Signed)
[facility] [phone]   [phone]  CONSULT NOTE  REFERRING PHYSICIAN: Dr. Patsi Sears  REASON FOR CONSULTATION:  57 years old white female recently diagnosed with renal cell carcinoma.  HPI Christina Cameron is a 57 y.o. female with past medical history significant for asthma, osteoarthritis, history of cervical cancer in 1989, depression, COVID-19, diabetes mellitus, dysrhythmia, GERD, dyslipidemia, hypertension, PTSD, Raynaud's syndrome, sleep apnea, SVT status post ablation.  The patient had CT scan of the abdomen and pelvis on October 30, 2022 when she presented to the emergency department complaining of hematuria, dysuria and right flank pain.  She has history of right flank pain or recurrent UTI for the previous 2 months with hematuria and clots.  The CT scan of the abdomen and pelvis showed 7.6 cm right renal mass possibly renal cell carcinoma.  There was hyperdense intraluminal collection in the urinary bladder possibly hematoma giving her history but was nonspecific.  There was also nonobstructive left nephrolithiasis.  Repeat CT scan of the abdomen and pelvis on 11/04/2022 showed right renal mass measuring 10.1 x 5.3 cm which abuts the gluteus fascia with some stranding/thickening of the fascia and without definite soft tissue extension beyond it.  There was marked involvement of the right renal vein with probable involvement of the IVC.Marland Kitchen  There was collateral venous drainage pathway in the right retroperitoneum draining into the inferior aspect of the IVC and prominent retroperitoneal lymph nodes measuring up to 8 mm that is nonspecific but suspicious for metastatic disease.  There was no evidence of distant metastatic disease in the abdomen.  The patient had MRI of the abdomen on 11/10/2022 and that showed 11.1 cm interpolar right renal mass extending to the renal sinuses compatible with renal cell carcinoma.  There was suspected right renal vein invasion  and the small retroperitoneal nodes suspicious for nodal metastasis.  The patient was seen by Dr. Wilkie Aye and on 12/25/2022 she underwent robotic assisted laparoscopic right radical nephrectomy.  The final pathology ([id number]) showed clear-cell renal cell carcinoma with focal rhabdoid features, nuclear grade 4 with tumor size of 10.0 cm and tumor extends into the renal sinus fat and renal vein (pT3a).  The ureteral, vascular and all margins of resection were negative for tumor.  Repeat CT scan of the abdomen and pelvis without contrast on 01/09/2023 showed postsurgical changes of the interval right nephrectomy with mild soft tissue seen in the nephrectomy bed likely postsurgical.  There was no evidence of metastatic disease in the abdomen or pelvis within the limitations of the noncontrast exam.  The patient was referred to me today for evaluation and recommendation regarding treatment of her condition.  When seen today the patient continues to complain of fatigue with occasional nausea and shortness of breath with exertion.  She also has intermittent headache.  She denied having any chest pain, cough or hemoptysis.  He has no vomiting, abdominal pain, diarrhea or constipation.  She has no recent weight loss.  She has postoperative hernia and she is scheduled to see a general surgeon with [facility] for her hernia repair later this week. Family history significant for father with throat cancer.  Mother had breast cancer and brother had colon cancer. The patient is married and has 1 living daughter age 41.  Her husband Christina Cameron is a patient of mine.  She is currently on disability.  She has no history of smoking, alcohol or drug abuse.  HPI  Past Medical History:  Diagnosis Date   Arm vein blood clot, unspecified laterality    around 20 years ago as of 08/10/21, Patient doesn't remember what caused it. She was not put on blood thinners.   Arthritis    right knee   Asthma    Cancer  (HCC)    cervical 1989   Cancer of kidney (HCC)    Chest pain    a. normal cors by cath in 11/2014 / 03/02/2020 nuclear stress test demonstrated no perfusion defects consistent with prior infart or current ischemia. Normal study.   Chronic diastolic (congestive) heart failure (HCC)    09/27/20 Echocardiogram in Epic showed LVEF 60 to 65%.   Complication of anesthesia    post-operative nausea & vomiting   COVID-19    05/29/19 & 05/2020 Covid infections   Depression    Diabetes mellitus, type 2 (HCC)    Dysrhythmia    heart palpitations   GERD (gastroesophageal reflux disease)    H/O cardiovascular stress test 03/02/2020   03/02/2020 Nuclear stress test in Epic showed no perusion defects consistent with prior infarct or ischemia - normal study.   Headache    migraines   High cholesterol    pt takes Crestor   History of cardiac radiofrequency ablation    Hypertension    Last cardiology office visit, 10/22/20 with Rennis Harding, NP ( see note in Epic) as of 08/09/21.   Morbid (severe) obesity due to excess calories (HCC)    Neuromuscular disorder (HCC)    severe neuropathy in feet   Osteoarthritis of knee, unspecified    Palpitations    PONV (postoperative nausea and vomiting)    Post-COVID syndrome 05/2019   Pt experienced chest tightness, sob, palpitations & dizziness after Covid infection. See 04/2020 note from Rennis Harding, NP in [place].   PTSD (post-traumatic stress disorder)    Raynaud's syndrome    Sleep apnea 09/24/2020   sleep study indicated moderate sleep apea   SVT (supraventricular tachycardia)    a. s/p ablation by Dr. Ladona Ridgel in 2006.   Wears glasses    prescripton reading glasses    Past Surgical History:  Procedure Laterality Date   CARDIAC CATHETERIZATION N/A 12/14/2014   Procedure: Right/Left Heart Cath and Coronary Angiography;  Surgeon: Tonny Bollman, MD;  Location: [facility];  Service: Cardiovascular;  Laterality: N/A;   CARDIAC ELECTROPHYSIOLOGY STUDY  AND ABLATION     around 2006, Patient states that heart rate was up to 180 bpm.   CESAREAN SECTION     x2 1990, 2005   CYSTOSCOPY  08/17/2021   Procedure: CYSTOSCOPY;  Surgeon: Carrington Clamp, MD;  Location: [facility];  Service: Gynecology;;   ENDOMETRIAL ABLATION     HYSTEROSCOPY WITH D & C N/A 03/11/2021   Procedure: DILATATION AND CURETTAGE /HYSTEROSCOPY;  Surgeon: Marlow Baars, MD;  Location: [facility] OR;  Service: Gynecology;  Laterality: N/A;   OPERATIVE ULTRASOUND N/A 03/11/2021   Procedure: OPERATIVE ULTRASOUND;  Surgeon: Marlow Baars, MD;  Location: [facility] OR;  Service: Gynecology;  Laterality: N/A;  ultrasound guidance needed   ROBOT ASSISTED LAPAROSCOPIC NEPHRECTOMY Right 12/25/2022   Procedure: XI ROBOTIC ASSISTED LAPAROSCOPIC NEPHRECTOMY;  Surgeon: Malen Gauze, MD;  Location: [facility];  Service: Urology;  Laterality: Right;   ROBOTIC ASSISTED LAPAROSCOPIC HYSTERECTOMY AND SALPINGECTOMY Bilateral 08/17/2021   Procedure: XI ROBOTIC ASSISTED LAPAROSCOPIC HYSTERECTOMY AND  BILATERAL SALPINGECTOMY AND OOPHERETOMY;  Surgeon: Carrington Clamp, MD;  Location: [facility] [place];  Service: Gynecology;  Laterality: Bilateral;  Family History  Problem Relation Age of Onset   Cancer Mother    Cancer Father    Parkinson's disease Father    Stroke Brother    Hypertension Brother    Cancer - Colon Brother     Social History Social History   Tobacco Use   Smoking status: Never    Passive exposure: Past   Smokeless tobacco: Never  Vaping Use   Vaping status: Never Used  Substance Use Topics   Alcohol use: Yes    Comment: seldom   Drug use: No    No Known Allergies  Current Outpatient Medications  Medication Sig Dispense Refill   albuterol (VENTOLIN HFA) 108 (90 Base) MCG/ACT inhaler Inhale 1-2 puffs into the lungs every 6 (six) hours as needed for wheezing or shortness of breath. 18 g 0   Azelastine-Fluticasone 137-50 MCG/ACT SUSP Place 1 spray  into the nose every 12 (twelve) hours. (Patient not taking: Reported on 01/26/2023) 23 g 0   brexpiprazole (REXULTI) 2 MG TABS tablet Take 2 mg by mouth daily.     buPROPion (WELLBUTRIN XL) 150 MG 24 hr tablet Take 150 mg by mouth every morning.     busPIRone (BUSPAR) 10 MG tablet Take 10 mg by mouth 2 (two) times daily.     cetirizine (ZYRTEC) 10 MG tablet Take 10 mg by mouth daily as needed for allergies. (Patient not taking: Reported on 01/01/2023)     clotrimazole-betamethasone (LOTRISONE) cream Apply 1 Application topically 2 (two) times daily. 30 g 3   estradiol (ESTRACE) 0.5 MG tablet Take 0.5 mg by mouth daily.     ezetimibe (ZETIA) 10 MG tablet Take 1 tablet (10 mg total) by mouth daily. 90 tablet 3   furosemide (LASIX) 20 MG tablet Take 1 tablet (20 mg total) by mouth daily. (Patient taking differently: Take 20 mg by mouth daily as needed for fluid or edema.) 90 tablet 3   gabapentin (NEURONTIN) 300 MG capsule Take 1 capsule (300 mg total) by mouth 3 (three) times daily. Start at bedtime and slowly increase to 3x a day as needed to make sure no side effects like sedation. (Patient taking differently: Take 300 mg by mouth at bedtime. Start at bedtime and slowly increase to 3x a day as needed to make sure no side effects like sedation.) 90 capsule 11   hydrOXYzine (ATARAX) 50 MG tablet Take 100 mg by mouth at bedtime.     ibuprofen (ADVIL) 600 MG tablet Take 1 tablet (600 mg total) by mouth every 8 (eight) hours as needed. 30 tablet 0   indapamide (LOZOL) 2.5 MG tablet Take 2.5 mg by mouth daily as needed (fluid).     isosorbide mononitrate (IMDUR) 30 MG 24 hr tablet Take 1 tablet (30 mg total) by mouth daily. (Patient not taking: Reported on 01/01/2023) 90 tablet 3   lamoTRIgine (LAMICTAL) 200 MG tablet Take 200 mg by mouth daily.     metFORMIN (GLUCOPHAGE) 500 MG tablet Take 1,000 mg by mouth 2 (two) times daily with a meal.     metoprolol tartrate (LOPRESSOR) 50 MG tablet Take 2 tablets (100 mg  total) by mouth 2 (two) times daily. 180 tablet 3   mupirocin ointment (BACTROBAN) 2 % Apply 1 Application topically 2 (two) times daily. 30 g 2   nitroGLYCERIN (NITROSTAT) 0.4 MG SL tablet DISSOLVE ONE TABLET UNDER THE TONGUE EVERY 5 MINUTES AS NEEDED FOR CHEST PAIN.  DO NOT EXCEED A TOTAL OF 3 DOSES IN 15 MINUTES  75 tablet 0   oxyCODONE-acetaminophen (PERCOCET) 5-325 MG tablet Take 1 tablet by mouth every 4 (four) hours as needed for severe pain. (Patient not taking: Reported on 01/26/2023) 30 tablet 0   rosuvastatin (CRESTOR) 40 MG tablet Take 1 tablet (40 mg total) by mouth daily. 90 tablet 3   Semaglutide, 2 MG/DOSE, (OZEMPIC, 2 MG/DOSE,) 8 MG/3ML SOPN Inject 2 mg into the skin every 7 (seven) days.     senna (SENOKOT) 8.6 MG TABS tablet Take 1 tablet (8.6 mg total) by mouth daily. 120 tablet 0   sertraline (ZOLOFT) 100 MG tablet Take 200 mg by mouth daily.     sulfamethoxazole-trimethoprim (BACTRIM DS) 800-160 MG tablet Take 1 tablet by mouth 2 (two) times daily. 14 tablet 0   No current facility-administered medications for this visit.    Review of Systems  Constitutional: positive for fatigue Eyes: negative Ears, nose, mouth, throat, and face: negative Respiratory: positive for dyspnea on exertion Cardiovascular: negative Gastrointestinal: positive for nausea Genitourinary:negative Integument/breast: negative Hematologic/lymphatic: negative Musculoskeletal:negative Neurological: positive for headaches Behavioral/Psych: negative Endocrine: negative Allergic/Immunologic: negative  Physical Exam  OVF:IEPPI, healthy, no distress, well nourished, well developed, and anxious SKIN: skin color, texture, turgor are normal, no rashes or significant lesions HEAD: Normocephalic, No masses, lesions, tenderness or abnormalities EYES: normal, PERRLA, Conjunctiva are pink and non-injected EARS: External ears normal, Canals clear OROPHARYNX:no exudate, no erythema, and lips, buccal mucosa,  and tongue normal  NECK: supple, no adenopathy, no JVD LYMPH:  no palpable lymphadenopathy, no hepatosplenomegaly BREAST:not examined LUNGS: clear to auscultation , and palpation HEART: regular rate & rhythm, no murmurs, and no gallops ABDOMEN:abdomen soft, non-tender, obese, normal bowel sounds, and no masses or organomegaly BACK: Back symmetric, no curvature., No CVA tenderness EXTREMITIES:no joint deformities, effusion, or inflammation, no edema  NEURO: alert & oriented x 3 with fluent speech, no focal motor/sensory deficits  PERFORMANCE STATUS: ECOG 1  LABORATORY DATA: Lab Results  Component Value Date   WBC 10.5 12/27/2022   HGB 10.3 (L) 12/27/2022   HCT 33.3 (L) 12/27/2022   MCV 90.7 12/27/2022   PLT 168 12/27/2022      Chemistry      Component Value Date/Time   NA 138 12/30/2022 0427   NA 142 11/08/2022 1530   NA 140 10/19/2011 0919   K 3.6 12/30/2022 0427   K 4.1 10/19/2011 0919   CL 105 12/30/2022 0427   CL 104 10/19/2011 0919   CO2 24 12/30/2022 0427   CO2 24 10/19/2011 0919   BUN 15 12/30/2022 0427   BUN 16 11/08/2022 1530   BUN 16 10/19/2011 0919   CREATININE 0.99 12/30/2022 0427   CREATININE 0.63 10/19/2011 0919   GLU 297 04/15/2019 0000      Component Value Date/Time   CALCIUM 8.1 (L) 12/30/2022 0427   CALCIUM 8.5 10/19/2011 0919   ALKPHOS 54 11/08/2022 1530   ALKPHOS 57 10/19/2011 0919   AST 17 11/08/2022 1530   AST 19 10/19/2011 0919   ALT 15 11/08/2022 1530   ALT 24 10/19/2011 0919   BILITOT 0.3 11/08/2022 1530   BILITOT 0.7 10/19/2011 0919       RADIOGRAPHIC STUDIES: CT ABDOMEN PELVIS WO CONTRAST  Result Date: 01/09/2023 CLINICAL DATA:  History of renal cell; * Tracking Code: BO * EXAM: CT ABDOMEN AND PELVIS WITHOUT CONTRAST TECHNIQUE: Multidetector CT imaging of the abdomen and pelvis was performed following the standard protocol without IV contrast. RADIATION DOSE REDUCTION: This exam was performed according to the  departmental  dose-optimization program which includes automated exposure control, adjustment of the mA and/or kV according to patient size and/or use of iterative reconstruction technique. COMPARISON:  CT abdomen dated November 03, 2022 FINDINGS: Lower chest: Stable tiny solid pulmonary nodule of the right lower lobe measuring 3 mm on series 4, image 14. Hepatobiliary: No focal liver abnormality is seen. No gallstones, gallbladder wall thickening, or biliary dilatation. Pancreas: Unremarkable. No pancreatic ductal dilatation or surrounding inflammatory changes. Spleen: Normal in size without focal abnormality. Adrenals/Urinary Tract: Bilateral adrenal glands are unremarkable. Interval right nephrectomy with mild soft tissue in the nephrectomy bed. No left hydronephrosis. Punctate nonobstructing stones of the lower pole of the left kidney. Bladder is unremarkable. Stomach/Bowel: Stomach is within normal limits. Appendix appears normal. No evidence of bowel wall thickening, distention, or inflammatory changes. Vascular/Lymphatic: Aortic atherosclerosis. Stable prominent right para-aortic lymph node measuring 9 mm on series 2, image 50. No enlarged abdominal or pelvic lymph nodes. Reproductive: Status post hysterectomy. No adnexal masses. Other: Large ventral wall hernia of the right abdominal wall containing nondilated loops of small bowel. No abdominopelvic ascites. Musculoskeletal: No acute or significant osseous findings. IMPRESSION: 1. Postsurgical changes of interval right nephrectomy. Mild soft tissue seen in the nephrectomy bed, likely postsurgical. Recommend attention on follow-up. 2. Within limitations of noncontrast exam, no evidence of metastatic disease in the abdomen or pelvis. Electronically Signed   By: Allegra Lai M.D.   On: 01/09/2023 14:35    ASSESSMENT: This is a very pleasant 57 years old white female recently diagnosed with a stage III (T3a, N0, M0) clear-cell renal cell carcinoma of the right kidney  diagnosed in July 2024 status post robotic assisted right radical nephrectomy and the final pathology was consistent with clear-cell renal cell carcinoma with focal rhabdoid features, nuclear grade 4 with tumor size of 10.0 cm and tumor extends into the renal sinus fat and renal vein (pT3a).  The ureteral, vascular and all margins of resection were negative for tumor.    PLAN: I had a lengthy discussion with the patient today about her current disease stage, prognosis and treatment options. I explained to the patient that she received curative treatment with the surgical resection. I also recommended for the patient to complete the staging workup by ordering CT scan of the chest to rule out pulmonary metastasis. I discussed with the patient her treatment options including adjuvant treatment with immunotherapy with pembrolizumab 200 Mg IV every 3 weeks for 1 year with the benefit of relapse free survival. I discussed with the patient the adverse effect of the immunotherapy including but not limited to immunotherapy mediated skin rash, diarrhea, inflammation of the lung, kidney, liver, thyroid or other endocrine dysfunction including type 1 diabetes mellitus. The patient is interested in the immunotherapy and she is expected to start the first cycle of this treatment in around 2 weeks. She is seeing general surgery for recommendation regarding her hernia repair.  If her hernia repair can be done within the next 2-3 weeks, I will delay her immunotherapy to start after the surgery but if there will be significant delay for the hernia repair, she will proceed with her treatment in 2 weeks as planned. I will see the patient back for follow-up visit with the start of her treatment. I will arrange for the patient to have a chemotherapy education class before the first dose of her treatment. She was advised to call immediately if she has any other concerning symptoms in the interval.  The patient  voices  understanding of current disease status and treatment options and is in agreement with the current care plan.  All questions were answered. The patient knows to call the clinic with any problems, questions or concerns. We can certainly see the patient much sooner if necessary.  Thank you so much for allowing me to participate in the care of Christina Cameron. I will continue to follow up the patient with you and assist in her care.  The total time spent in the appointment was 90 minutes.  Disclaimer: This note was dictated with voice recognition software. Similar sounding words can inadvertently be transcribed and may not be corrected upon review.   Lajuana Matte January 30, 2023, 9:55 AM

## 2023-01-30 NOTE — Progress Notes (Signed)
START ON PATHWAY REGIMEN - Renal Cell     A cycle is every 21 days:     Pembrolizumab   **Always confirm dose/schedule in your pharmacy ordering system**  Patient Characteristics: Postoperative without Neoadjuvant Therapy, M0 (Pathologic Staging), Stage I, II, III, or Resected T4M0 Stage IV, Intermediate-High Risk Therapeutic Status: Postoperative without Neoadjuvant Therapy, M0 (Pathologic Staging) AJCC M Category: cM0 AJCC 8 Stage Grouping: III AJCC T Category: pT3a AJCC N Category: pN0 Risk Status: Intermediate-High Risk Intent of Therapy: Curative Intent, Discussed with Patient 

## 2023-01-31 ENCOUNTER — Encounter: Payer: Self-pay | Admitting: Internal Medicine

## 2023-01-31 ENCOUNTER — Other Ambulatory Visit: Payer: Self-pay

## 2023-02-01 ENCOUNTER — Encounter: Payer: Self-pay | Admitting: Medical Oncology

## 2023-02-01 DIAGNOSIS — K432 Incisional hernia without obstruction or gangrene: Secondary | ICD-10-CM | POA: Diagnosis not present

## 2023-02-01 NOTE — Telephone Encounter (Signed)
Please see patient message and respond

## 2023-02-02 ENCOUNTER — Telehealth: Payer: Self-pay | Admitting: Internal Medicine

## 2023-02-02 NOTE — Telephone Encounter (Signed)
Patient is calling because the insurance is requesting the provider do peer to peer for her upcoming CT scan. Please advise.

## 2023-02-02 NOTE — Telephone Encounter (Signed)
Scheduled per 09/03 los, patient has been called and voicemail was left. 

## 2023-02-05 ENCOUNTER — Encounter: Payer: Self-pay | Admitting: Obstetrics

## 2023-02-05 ENCOUNTER — Encounter: Payer: Self-pay | Admitting: Internal Medicine

## 2023-02-05 ENCOUNTER — Other Ambulatory Visit: Payer: Self-pay | Admitting: Urology

## 2023-02-05 DIAGNOSIS — Z1231 Encounter for screening mammogram for malignant neoplasm of breast: Secondary | ICD-10-CM

## 2023-02-05 MED ORDER — OXYCODONE-ACETAMINOPHEN 5-325 MG PO TABS: 1.0000 | ORAL_TABLET | ORAL | 0 refills | Status: DC | PRN

## 2023-02-06 ENCOUNTER — Other Ambulatory Visit: Payer: Self-pay

## 2023-02-06 ENCOUNTER — Other Ambulatory Visit: Payer: Self-pay | Admitting: Obstetrics

## 2023-02-06 ENCOUNTER — Telehealth: Payer: Self-pay

## 2023-02-06 ENCOUNTER — Encounter (HOSPITAL_COMMUNITY): Admission: RE | Admit: 2023-02-06 | Payer: 59 | Source: Ambulatory Visit

## 2023-02-06 DIAGNOSIS — I5032 Chronic diastolic (congestive) heart failure: Secondary | ICD-10-CM

## 2023-02-06 DIAGNOSIS — R079 Chest pain, unspecified: Secondary | ICD-10-CM

## 2023-02-06 DIAGNOSIS — N63 Unspecified lump in unspecified breast: Secondary | ICD-10-CM

## 2023-02-06 NOTE — Telephone Encounter (Signed)
Patient called requesting a copy of her records be sent to [facility] general surgery for a second opinion.  I returned her call and informed her that she will have to contact general surgery and fill out a release of records with their office in order to have records sent.  Patient voiced understanding.

## 2023-02-06 NOTE — Progress Notes (Signed)
Pharmacist Chemotherapy Monitoring - Initial Assessment    Anticipated start date: 02/13/23   The following has been reviewed per standard work regarding the patient's treatment regimen: The patient's diagnosis, treatment plan and drug doses, and organ/hematologic function Lab orders and baseline tests specific to treatment regimen  The treatment plan start date, drug sequencing, and pre-medications Prior authorization status  Patient's documented medication list, including drug-drug interaction screen and prescriptions for anti-emetics and supportive care specific to the treatment regimen The drug concentrations, fluid compatibility, administration routes, and timing of the medications to be used The patient's access for treatment and lifetime cumulative dose history, if applicable  The patient's medication allergies and previous infusion related reactions, if applicable   Changes made to treatment plan:  N/A  Follow up needed:  Pending authorization for treatment    Janeice Robinson, RPH, 02/06/2023  1:37 PM

## 2023-02-07 ENCOUNTER — Other Ambulatory Visit: Payer: Self-pay

## 2023-02-07 ENCOUNTER — Inpatient Hospital Stay: Payer: 59

## 2023-02-08 ENCOUNTER — Ambulatory Visit (HOSPITAL_COMMUNITY)
Admission: RE | Admit: 2023-02-08 | Discharge: 2023-02-08 | Disposition: A | Discharge status: Home / Self Care | Payer: 59 | Source: Ambulatory Visit | Attending: Internal Medicine | Admitting: Internal Medicine

## 2023-02-08 DIAGNOSIS — C641 Malignant neoplasm of right kidney, except renal pelvis: Secondary | ICD-10-CM | POA: Diagnosis not present

## 2023-02-08 DIAGNOSIS — K449 Diaphragmatic hernia without obstruction or gangrene: Secondary | ICD-10-CM | POA: Diagnosis not present

## 2023-02-08 DIAGNOSIS — R911 Solitary pulmonary nodule: Secondary | ICD-10-CM | POA: Diagnosis not present

## 2023-02-09 ENCOUNTER — Telehealth: Payer: 59 | Admitting: Family Medicine

## 2023-02-09 DIAGNOSIS — K047 Periapical abscess without sinus: Secondary | ICD-10-CM

## 2023-02-10 MED ORDER — PENICILLIN V POTASSIUM 500 MG PO TABS
500.0000 mg | ORAL_TABLET | Freq: Three times a day (TID) | ORAL | 0 refills | Status: AC
Start: 2023-02-10 — End: 2023-02-17

## 2023-02-10 NOTE — Progress Notes (Signed)

## 2023-02-13 ENCOUNTER — Inpatient Hospital Stay: Payer: 59

## 2023-02-13 ENCOUNTER — Other Ambulatory Visit: Payer: Self-pay

## 2023-02-13 ENCOUNTER — Inpatient Hospital Stay (HOSPITAL_BASED_OUTPATIENT_CLINIC_OR_DEPARTMENT_OTHER): Payer: 59 | Admitting: Internal Medicine

## 2023-02-13 DIAGNOSIS — C641 Malignant neoplasm of right kidney, except renal pelvis: Secondary | ICD-10-CM

## 2023-02-13 DIAGNOSIS — I7300 Raynaud's syndrome without gangrene: Secondary | ICD-10-CM | POA: Diagnosis not present

## 2023-02-13 DIAGNOSIS — G4730 Sleep apnea, unspecified: Secondary | ICD-10-CM | POA: Diagnosis not present

## 2023-02-13 DIAGNOSIS — I110 Hypertensive heart disease with heart failure: Secondary | ICD-10-CM | POA: Diagnosis not present

## 2023-02-13 DIAGNOSIS — K219 Gastro-esophageal reflux disease without esophagitis: Secondary | ICD-10-CM | POA: Diagnosis not present

## 2023-02-13 DIAGNOSIS — E119 Type 2 diabetes mellitus without complications: Secondary | ICD-10-CM | POA: Diagnosis not present

## 2023-02-13 DIAGNOSIS — E7800 Pure hypercholesterolemia, unspecified: Secondary | ICD-10-CM | POA: Diagnosis not present

## 2023-02-13 DIAGNOSIS — J45909 Unspecified asthma, uncomplicated: Secondary | ICD-10-CM | POA: Diagnosis not present

## 2023-02-13 DIAGNOSIS — Z79899 Other long term (current) drug therapy: Secondary | ICD-10-CM | POA: Diagnosis not present

## 2023-02-13 LAB — CBC WITH DIFFERENTIAL (CANCER CENTER ONLY)
Abs Immature Granulocytes: 0.03 10*3/uL (ref 0.00–0.07)
Basophils Absolute: 0.1 10*3/uL (ref 0.0–0.1)
Basophils Relative: 1 %
Eosinophils Absolute: 0.3 10*3/uL (ref 0.0–0.5)
Eosinophils Relative: 4 %
HCT: 38.8 % (ref 36.0–46.0)
Hemoglobin: 12.9 g/dL (ref 12.0–15.0)
Immature Granulocytes: 1 %
Lymphocytes Relative: 29 %
Lymphs Abs: 1.8 10*3/uL (ref 0.7–4.0)
MCH: 27.6 pg (ref 26.0–34.0)
MCHC: 33.2 g/dL (ref 30.0–36.0)
MCV: 82.9 fL (ref 80.0–100.0)
Monocytes Absolute: 0.6 10*3/uL (ref 0.1–1.0)
Monocytes Relative: 9 %
Neutro Abs: 3.5 10*3/uL (ref 1.7–7.7)
Neutrophils Relative %: 56 %
Platelet Count: 233 10*3/uL (ref 150–400)
RBC: 4.68 MIL/uL (ref 3.87–5.11)
RDW: 13.9 % (ref 11.5–15.5)
WBC Count: 6.3 10*3/uL (ref 4.0–10.5)
nRBC: 0 % (ref 0.0–0.2)

## 2023-02-13 LAB — CMP (CANCER CENTER ONLY)
ALT: 19 U/L (ref 0–44)
AST: 19 U/L (ref 15–41)
Albumin: 4 g/dL (ref 3.5–5.0)
Alkaline Phosphatase: 51 U/L (ref 38–126)
Anion gap: 6 (ref 5–15)
BUN: 21 mg/dL — ABNORMAL HIGH (ref 6–20)
CO2: 26 mmol/L (ref 22–32)
Calcium: 9.2 mg/dL (ref 8.9–10.3)
Chloride: 106 mmol/L (ref 98–111)
Creatinine: 0.93 mg/dL (ref 0.44–1.00)
GFR, Estimated: >60 mL/min (ref >60)
Glucose, Bld: 200 mg/dL — ABNORMAL HIGH (ref 70–99)
Potassium: 4.1 mmol/L (ref 3.5–5.1)
Sodium: 138 mmol/L (ref 135–145)
Total Bilirubin: 0.3 mg/dL (ref 0.3–1.2)
Total Protein: 7.2 g/dL (ref 6.5–8.1)

## 2023-02-13 LAB — TSH: TSH: 2.506 u[IU]/mL (ref 0.350–4.500)

## 2023-02-13 MED ORDER — SODIUM CHLORIDE 0.9 % IV SOLN: Freq: Once | INTRAVENOUS | Status: AC

## 2023-02-13 MED ORDER — SODIUM CHLORIDE 0.9 % IV SOLN
200.0000 mg | Freq: Once | INTRAVENOUS | Status: AC
  Administered 2023-02-13: 200 mg via INTRAVENOUS
  Filled 2023-02-13: qty 8

## 2023-02-13 NOTE — Patient Instructions (Addendum)
[facility] [facility]  Discharge Instructions: Thank you for choosing [facility] to provide your oncology and hematology care.   If you have a lab appointment with the Cancer Center, please go directly to the Cancer Center and check in at the registration area.   Wear comfortable clothing and clothing appropriate for easy access to any Portacath or PICC line.   We strive to give you quality time with your provider. You may need to reschedule your appointment if you arrive late (15 or more minutes).  Arriving late affects you and other patients whose appointments are after yours.  Also, if you miss three or more appointments without notifying the office, you may be dismissed from the clinic at the provider's discretion.      For prescription refill requests, have your pharmacy contact our office and allow 72 hours for refills to be completed.    Today you received the following immunotherapy agents PEMBROLIZUMAB   To help prevent nausea and vomiting after your treatment, we encourage you to take your nausea medication as directed.  BELOW ARE SYMPTOMS THAT SHOULD BE REPORTED IMMEDIATELY: *FEVER GREATER THAN 100.4 F (38 C) OR HIGHER *CHILLS OR SWEATING *NAUSEA AND VOMITING THAT IS NOT CONTROLLED WITH YOUR NAUSEA MEDICATION *UNUSUAL SHORTNESS OF BREATH *UNUSUAL BRUISING OR BLEEDING *URINARY PROBLEMS (pain or burning when urinating, or frequent urination) *BOWEL PROBLEMS (unusual diarrhea, constipation, pain near the anus) TENDERNESS IN MOUTH AND THROAT WITH OR WITHOUT PRESENCE OF ULCERS (sore throat, sores in mouth, or a toothache) UNUSUAL RASH, SWELLING OR PAIN  UNUSUAL VAGINAL DISCHARGE OR ITCHING   Items with * indicate a potential emergency and should be followed up as soon as possible or go to the Emergency Department if any problems should occur.  Please show the CHEMOTHERAPY ALERT CARD or IMMUNOTHERAPY ALERT CARD at check-in to the Emergency  Department and triage nurse.  Should you have questions after your visit or need to cancel or reschedule your appointment, please contact [place] [facility]  Dept: [phone]  and follow the prompts.  Office hours are 8:00 a.m. to 4:30 p.m. Monday - Friday. Please note that voicemails left after 4:00 p.m. may not be returned until the following business day.  We are closed weekends and major holidays. You have access to a nurse at all times for urgent questions. Please call the main number to the clinic Dept: [phone] and follow the prompts.   For any non-urgent questions, you may also contact your provider using MyChart. We now offer e-Visits for anyone 27 and older to request care online for non-urgent symptoms. For details visit [email or web address].   Also download the MyChart app! Go to the app store, search "MyChart", open the app, select [place], and log in with your MyChart username and password.  Pembrolizumab Injection What is this medication? PEMBROLIZUMAB (PEM broe LIZ ue mab) treats some types of cancer. It works by helping your immune system slow or stop the spread of cancer cells. It is a monoclonal antibody. This medicine may be used for other purposes; ask your health care provider or pharmacist if you have questions. COMMON BRAND NAME(S): Keytruda What should I tell my care team before I take this medication? They need to know if you have any of these conditions: Allogeneic stem cell transplant (uses someone else's stem cells) Autoimmune diseases, such as Crohn disease, ulcerative colitis, lupus History of chest radiation Nervous system problems, such as Guillain-Barre  syndrome, myasthenia gravis Organ transplant An unusual or allergic reaction to pembrolizumab, other medications, foods, dyes, or preservatives Pregnant or trying to get pregnant Breast-feeding How should I use this medication? This medication is injected into a  vein. It is given by your care team in a hospital or clinic setting. A special MedGuide will be given to you before each treatment. Be sure to read this information carefully each time. Talk to your care team about the use of this medication in children. While it may be prescribed for children as young as 6 months for selected conditions, precautions do apply. Overdosage: If you think you have taken too much of this medicine contact a poison control center or emergency room at once. NOTE: This medicine is only for you. Do not share this medicine with others. What if I miss a dose? Keep appointments for follow-up doses. It is important not to miss your dose. Call your care team if you are unable to keep an appointment. What may interact with this medication? Interactions have not been studied. This list may not describe all possible interactions. Give your health care provider a list of all the medicines, herbs, non-prescription drugs, or dietary supplements you use. Also tell them if you smoke, drink alcohol, or use illegal drugs. Some items may interact with your medicine. What should I watch for while using this medication? Your condition will be monitored carefully while you are receiving this medication. You may need blood work while taking this medication. This medication may cause serious skin reactions. They can happen weeks to months after starting the medication. Contact your care team right away if you notice fevers or flu-like symptoms with a rash. The rash may be red or purple and then turn into blisters or peeling of the skin. You may also notice a red rash with swelling of the face, lips, or lymph nodes in your neck or under your arms. Tell your care team right away if you have any change in your eyesight. Talk to your care team if you may be pregnant. Serious birth defects can occur if you take this medication during pregnancy and for 4 months after the last dose. You will need a  negative pregnancy test before starting this medication. Contraception is recommended while taking this medication and for 4 months after the last dose. Your care team can help you find the option that works for you. Do not breastfeed while taking this medication and for 4 months after the last dose. What side effects may I notice from receiving this medication? Side effects that you should report to your care team as soon as possible: Allergic reactions--skin rash, itching, hives, swelling of the face, lips, tongue, or throat Dry cough, shortness of breath or trouble breathing Eye pain, redness, irritation, or discharge with blurry or decreased vision Heart muscle inflammation--unusual weakness or fatigue, shortness of breath, chest pain, fast or irregular heartbeat, dizziness, swelling of the ankles, feet, or hands Hormone gland problems--headache, sensitivity to light, unusual weakness or fatigue, dizziness, fast or irregular heartbeat, increased sensitivity to cold or heat, excessive sweating, constipation, hair loss, increased thirst or amount of urine, tremors or shaking, irritability Infusion reactions--chest pain, shortness of breath or trouble breathing, feeling faint or lightheaded Kidney injury (glomerulonephritis)--decrease in the amount of urine, red or dark brown urine, foamy or bubbly urine, swelling of the ankles, hands, or feet Liver injury--right upper belly pain, loss of appetite, nausea, light-colored stool, dark yellow or brown urine, yellowing skin or  eyes, unusual weakness or fatigue Pain, tingling, or numbness in the hands or feet, muscle weakness, change in vision, confusion or trouble speaking, loss of balance or coordination, trouble walking, seizures Rash, fever, and swollen lymph nodes Redness, blistering, peeling, or loosening of the skin, including inside the mouth Sudden or severe stomach pain, bloody diarrhea, fever, nausea, vomiting Side effects that usually do not  require medical attention (report to your care team if they continue or are bothersome): Bone, joint, or muscle pain Diarrhea Fatigue Loss of appetite Nausea Skin rash This list may not describe all possible side effects. Call your doctor for medical advice about side effects. You may report side effects to FDA at 1-800-FDA-1088. Where should I keep my medication? This medication is given in a hospital or clinic. It will not be stored at home. NOTE: This sheet is a summary. It may not cover all possible information. If you have questions about this medicine, talk to your doctor, pharmacist, or health care provider.  2024 Elsevier/Gold Standard (2021-09-27 00:00:00)

## 2023-02-13 NOTE — Progress Notes (Signed)
[facility] [phone]   Fax:([phone]  OFFICE PROGRESS NOTE  Christina Gaul, Christina Cameron [address] 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  The ureteral, vascular and all margins of resection were negative for tumor.   CURRENT THERAPY: Adjuvant treatment with immunotherapy with Keytruda 200 Mg IV every 3 weeks.  First dose  INTERVAL HISTORY: Christina Cameron 57 y.o. female returns to the clinic today for follow-up visit.  The patient is feeling fine today with no concerning complaints except for mild fatigue.  She denied having any current chest pain, shortness of breath, cough or hemoptysis.  She has no nausea, vomiting, diarrhea or constipation.  She has no headache or visual changes.  She has no recent weight loss or night sweats.  The patient had CT scan of the chest performed recently and she is here for evaluation before starting the first dose of her adjuvant treatment with immunotherapy.  MEDICAL HISTORY: Past Medical History:  Diagnosis Date   Arm vein blood clot, unspecified laterality    around 20 years ago as of 08/10/21, Patient doesn't remember what caused it. She was not put on blood thinners.   Arthritis    right knee   Asthma    Cancer (HCC)    cervical 1989   Cancer of kidney (HCC)    Chest pain    a. normal cors by cath in 11/2014 / 03/02/2020 nuclear stress test demonstrated no perfusion defects consistent with prior infart or current ischemia. Normal study.   Chronic diastolic (congestive) heart failure (HCC)    09/27/20 Echocardiogram in Epic showed  LVEF 60 to 65%.   Complication of anesthesia    post-operative nausea & vomiting   COVID-19    05/29/19 & 05/2020 Covid infections   Depression    Diabetes mellitus, type 2 (HCC)    Dysrhythmia    heart palpitations   GERD (gastroesophageal reflux disease)    H/O cardiovascular stress test 03/02/2020   03/02/2020 Nuclear stress test in Epic showed no perusion defects consistent with prior infarct or ischemia - normal study.   Headache    migraines   High cholesterol    pt takes Crestor   History of cardiac radiofrequency ablation    Hypertension    Last cardiology office visit, 10/22/20 with Rennis Harding, NP ( see note in Epic) as of 08/09/21.   Morbid (severe) obesity due to excess calories (HCC)    Neuromuscular disorder (HCC)    severe neuropathy in feet   Osteoarthritis of knee, unspecified    Palpitations    PONV (postoperative nausea and vomiting)    Post-COVID syndrome 05/2019   Pt experienced chest tightness, sob, palpitations & dizziness after Covid infection. See 04/2020 note from Rennis Harding, NP in [place].   PTSD (post-traumatic stress disorder)    Raynaud's syndrome    Sleep apnea 09/24/2020   sleep study indicated moderate sleep apea   SVT (supraventricular tachycardia)    a. s/p ablation by Dr. Ladona Ridgel in 2006.   Wears glasses    prescripton reading glasses    ALLERGIES:  has No Known Allergies.  MEDICATIONS:  Current Outpatient Medications  Medication Sig Dispense Refill   albuterol (VENTOLIN HFA) 108 (90 Base) MCG/ACT inhaler Inhale 1-2 puffs into the lungs every 6 (six) hours as needed for wheezing or shortness of breath. 18 g 0   Azelastine-Fluticasone 137-50 MCG/ACT SUSP Place 1 spray into the nose every 12 (twelve) hours. (Patient not taking: Reported on 01/26/2023) 23 g 0   brexpiprazole (REXULTI) 2 MG TABS tablet Take 2 mg by mouth daily.     buPROPion (WELLBUTRIN XL) 150 MG 24 hr tablet Take 150 mg by mouth every morning.     busPIRone (BUSPAR) 10 MG  tablet Take 10 mg by mouth 2 (two) times daily.     cetirizine (ZYRTEC) 10 MG tablet Take 10 mg by mouth daily as needed for allergies. (Patient not taking: Reported on 01/01/2023)     clotrimazole-betamethasone (LOTRISONE) cream Apply 1 Application topically 2 (two) times daily. 30 g 3   estradiol (ESTRACE) 0.5 MG tablet Take 0.5 mg by mouth daily.     ezetimibe (ZETIA) 10 MG tablet Take 1 tablet (10 mg total) by mouth daily. 90 tablet 3   furosemide (LASIX) 20 MG tablet Take 1 tablet (20 mg total) by mouth daily. (Patient taking differently: Take 20 mg by mouth daily as needed for fluid or edema.) 90 tablet 3   gabapentin (NEURONTIN) 300 MG capsule Take 1 capsule (300 mg total) by mouth 3 (three) times daily. Start at bedtime and slowly increase to 3x a day as needed to make sure no side effects like sedation. (Patient taking differently: Take 300 mg by mouth at bedtime. Start at bedtime and slowly increase to 3x a day as needed to make sure no side effects like sedation.) 90 capsule 11   hydrOXYzine (ATARAX) 50 MG tablet Take 100 mg by mouth at bedtime.     ibuprofen (ADVIL) 600 MG tablet Take 1 tablet (600 mg total) by mouth every 8 (eight) hours as needed. 30 tablet 0   indapamide (LOZOL) 2.5 MG tablet Take 2.5 mg by mouth daily as needed (fluid).     isosorbide mononitrate (IMDUR) 30 MG 24 hr tablet Take 1 tablet (30 mg total) by mouth daily. (Patient not taking: Reported on 01/01/2023) 90 tablet 3   lamoTRIgine (LAMICTAL) 200 MG tablet Take 200 mg by mouth daily.     metFORMIN (GLUCOPHAGE) 500 MG tablet Take 1,000 mg by mouth 2 (two) times daily with a meal.     metoprolol tartrate (LOPRESSOR) 50 MG tablet Take 2 tablets (100 mg total) by mouth 2 (two) times daily. 180 tablet 3   mupirocin ointment (BACTROBAN) 2 % Apply 1 Application topically 2 (two) times daily. 30 g 2   nitroGLYCERIN (NITROSTAT) 0.4 MG SL tablet DISSOLVE ONE TABLET UNDER THE TONGUE EVERY 5 MINUTES AS NEEDED FOR CHEST PAIN.  DO  NOT EXCEED A TOTAL OF 3 DOSES IN 15 MINUTES 75 tablet 0   oxyCODONE-acetaminophen (PERCOCET) 5-325 MG tablet Take 1 tablet by mouth every 4 (four) hours as needed for severe pain. 30 tablet 0   penicillin v potassium (VEETID) 500 MG tablet Take 1 tablet (500 mg total) by mouth 3 (three) times daily for 7 days. 21 tablet 0   prochlorperazine (COMPAZINE) 10 MG tablet Take 1 tablet (10 mg total) by mouth every 6 (six) hours as needed for nausea or vomiting. 30 tablet 1   rosuvastatin (CRESTOR) 40 MG tablet Take 1 tablet (40 mg total) by  mouth daily. 90 tablet 3   Semaglutide, 2 MG/DOSE, (OZEMPIC, 2 MG/DOSE,) 8 MG/3ML SOPN Inject 2 mg into the skin every 7 (seven) days.     senna (SENOKOT) 8.6 MG TABS tablet Take 1 tablet (8.6 mg total) by mouth daily. 120 tablet 0   sertraline (ZOLOFT) 100 MG tablet Take 200 mg by mouth daily.     sulfamethoxazole-trimethoprim (BACTRIM DS) 800-160 MG tablet Take 1 tablet by mouth 2 (two) times daily. 14 tablet 0   No current facility-administered medications for this visit.    SURGICAL HISTORY:  Past Surgical History:  Procedure Laterality Date   CARDIAC CATHETERIZATION N/A 12/14/2014   Procedure: Right/Left Heart Cath and Coronary Angiography;  Surgeon: Tonny Bollman, Christina Cameron;  Location: [facility] INVASIVE CV LAB;  Service: Cardiovascular;  Laterality: N/A;   CARDIAC ELECTROPHYSIOLOGY STUDY AND ABLATION     around 2006, Patient states that heart rate was up to 180 bpm.   CESAREAN SECTION     x2 1990, 2005   CYSTOSCOPY  08/17/2021   Procedure: CYSTOSCOPY;  Surgeon: Carrington Clamp, Christina Cameron;  Location: [facility];  Service: Gynecology;;   ENDOMETRIAL ABLATION     HYSTEROSCOPY WITH D & C N/A 03/11/2021   Procedure: DILATATION AND CURETTAGE /HYSTEROSCOPY;  Surgeon: Marlow Baars, Christina Cameron;  Location: [facility] OR;  Service: Gynecology;  Laterality: N/A;   OPERATIVE ULTRASOUND N/A 03/11/2021   Procedure: OPERATIVE ULTRASOUND;  Surgeon: Marlow Baars, Christina Cameron;  Location: [facility] OR;   Service: Gynecology;  Laterality: N/A;  ultrasound guidance needed   ROBOT ASSISTED LAPAROSCOPIC NEPHRECTOMY Right 12/25/2022   Procedure: XI ROBOTIC ASSISTED LAPAROSCOPIC NEPHRECTOMY;  Surgeon: Malen Gauze, Christina Cameron;  Location: [facility] ORS;  Service: Urology;  Laterality: Right;   ROBOTIC ASSISTED LAPAROSCOPIC HYSTERECTOMY AND SALPINGECTOMY Bilateral 08/17/2021   Procedure: XI ROBOTIC ASSISTED LAPAROSCOPIC HYSTERECTOMY AND  BILATERAL SALPINGECTOMY AND OOPHERETOMY;  Surgeon: Carrington Clamp, Christina Cameron;  Location: [facility] [place];  Service: Gynecology;  Laterality: Bilateral;    REVIEW OF SYSTEMS:  A comprehensive review of systems was negative except for: Constitutional: positive for fatigue   PHYSICAL EXAMINATION: General appearance: alert, cooperative, fatigued, and no distress Head: Normocephalic, without obvious abnormality, atraumatic Neck: no adenopathy, no JVD, supple, symmetrical, trachea midline, and thyroid not enlarged, symmetric, no tenderness/mass/nodules Lymph nodes: Cervical, supraclavicular, and axillary nodes normal. Resp: clear to auscultation bilaterally Back: symmetric, no curvature. ROM normal. No CVA tenderness. Cardio: regular rate and rhythm, S1, S2 normal, no murmur, click, rub or gallop GI: soft, non-tender; bowel sounds normal; no masses,  no organomegaly Extremities: extremities normal, atraumatic, no cyanosis or edema  ECOG PERFORMANCE STATUS: 1 - Symptomatic but completely ambulatory  Blood pressure 135/75, pulse 78, temperature 98.2 F (36.8 C), temperature source Oral, resp. rate 15, height 5\' 4"  (1.626 m), weight 262 lb 11.2 oz (119.2 kg), SpO2 98%.  LABORATORY DATA: Lab Results  Component Value Date   WBC 6.3 02/13/2023   HGB 12.9 02/13/2023   HCT 38.8 02/13/2023   MCV 82.9 02/13/2023   PLT 233 02/13/2023      Chemistry      Component Value Date/Time   NA 137 01/30/2023 1146   NA 142 11/08/2022 1530   NA 140 10/19/2011 0919   K 4.4 01/30/2023  1146   K 4.1 10/19/2011 0919   CL 104 01/30/2023 1146   CL 104 10/19/2011 0919   CO2 25 01/30/2023 1146   CO2 24 10/19/2011 0919   BUN 16 01/30/2023 1146   BUN 16 11/08/2022 1530  BUN 16 10/19/2011 0919   CREATININE 0.90 01/30/2023 1146   CREATININE 0.63 10/19/2011 0919   GLU 297 04/15/2019 0000      Component Value Date/Time   CALCIUM 8.9 01/30/2023 1146   CALCIUM 8.5 10/19/2011 0919   ALKPHOS 50 01/30/2023 1146   ALKPHOS 57 10/19/2011 0919   AST 21 01/30/2023 1146   ALT 20 01/30/2023 1146   ALT 24 10/19/2011 0919   BILITOT 0.4 01/30/2023 1146       RADIOGRAPHIC STUDIES: CT Chest Wo Contrast  Result Date: 02/12/2023 CLINICAL DATA:  Kidney cancer staging; * Tracking Code: BO * EXAM: CT CHEST WITHOUT CONTRAST TECHNIQUE: Multidetector CT imaging of the chest was performed following the standard protocol without IV contrast. RADIATION DOSE REDUCTION: This exam was performed according to the departmental dose-optimization program which includes automated exposure control, adjustment of the mA and/or kV according to patient size and/or use of iterative reconstruction technique. COMPARISON:  CT abdomen and pelvis dated January 09, 2023; heart CT dated November 02, 2021 FINDINGS: Cardiovascular: Normal heart size. No pericardial effusion. Normal caliber thoracic aorta with no significant atherosclerotic disease. No visible coronary artery calcifications. Mediastinum/Nodes: Small hiatal hernia. Thyroid is unremarkable. No enlarged lymph nodes seen in the chest. Lungs/Pleura: Central airways are patent. No consolidation, pleural effusion or pneumothorax. Small solid pulmonary nodule of the right lower lobe measuring 3 mm on series 5 image 88, unchanged in size when compared with prior exam. Upper Abdomen: Hepatic steatosis.  Prior right nephrectomy. Musculoskeletal: No chest wall mass or suspicious bone lesions identified. IMPRESSION: 1. No evidence of metastatic disease in the chest. 2. Small solid  pulmonary nodule of the right lower lobe measuring 3 mm, likely benign given greater than 1 year stability. 3. Hepatic steatosis. Electronically Signed   By: Allegra Lai M.D.   On: 02/12/2023 16:35    ASSESSMENT AND PLAN: This is a very pleasant 57 years old white female recently diagnosed with a stage III (T3a, N0, M0) clear-cell renal cell carcinoma of the right kidney diagnosed in July 2024 status post robotic assisted right radical nephrectomy and the final pathology was consistent with clear-cell renal cell carcinoma with focal rhabdoid features, nuclear grade 4 with tumor size of 10.0 cm and tumor extends into the renal sinus fat and renal vein (pT3a).  The ureteral, vascular and all margins of resection were negative for tumor.  The patient had CT scan of the chest performed recently that showed no concerning findings for metastatic disease to the chest. She is currently undergoing adjuvant treatment with immunotherapy with Keytruda 200 Mg IV every 3 weeks.  She is starting the first cycle of her treatment on 02/13/2023. I recommended for the patient to proceed with the treatment as planned. I will see her back for follow-up visit in 3 weeks for evaluation before the next cycle of her treatment. She was advised to call immediately if she has any other concerning symptoms in the interval. The patient voices understanding of current disease status and treatment options and is in agreement with the current care plan.  All questions were answered. The patient knows to call the clinic with any problems, questions or concerns. We can certainly see the patient much sooner if necessary. The total time spent in the appointment was 20 minutes.  Disclaimer: This note was dictated with voice recognition software. Similar sounding words can inadvertently be transcribed and may not be corrected upon review.

## 2023-02-14 ENCOUNTER — Telehealth: Payer: Self-pay

## 2023-02-14 DIAGNOSIS — K439 Ventral hernia without obstruction or gangrene: Secondary | ICD-10-CM | POA: Diagnosis not present

## 2023-02-14 DIAGNOSIS — E1165 Type 2 diabetes mellitus with hyperglycemia: Secondary | ICD-10-CM | POA: Diagnosis not present

## 2023-02-14 NOTE — Telephone Encounter (Signed)
Ms. Kulczyk states that she is doing fine. She is eating, drinking, and urinating well. She is feeling more tired. She knows to call the office at [phone] if  she has any questions or concerns.

## 2023-02-14 NOTE — Telephone Encounter (Signed)
-----   Message from Nurse Karn Pickler sent at 02/13/2023 11:03 AM EDT ----- Regarding: Dr. Arbutus Ped 1st tx f/u call Dr. Arbutus Ped 1st tx f/u call Pembrolizumab. Tolerated well

## 2023-02-18 ENCOUNTER — Encounter (HOSPITAL_COMMUNITY): Payer: Self-pay

## 2023-02-19 ENCOUNTER — Telehealth: Payer: Self-pay | Admitting: Internal Medicine

## 2023-02-19 ENCOUNTER — Telehealth (HOSPITAL_COMMUNITY): Payer: Self-pay | Admitting: *Deleted

## 2023-02-19 ENCOUNTER — Encounter: Payer: Self-pay | Admitting: Internal Medicine

## 2023-02-19 NOTE — Telephone Encounter (Signed)
Attempted to call patient regarding upcoming cardiac PET appointment. Left message on voicemail with name and callback number  Larey Brick RN Navigator Cardiac Imaging [facility] [phone] Office [phone] Cell  Reminder to hold Imdur and caffeine prior to cardiac PET scan.

## 2023-02-19 NOTE — Telephone Encounter (Signed)
  Patient is calling because the hospital told her she cannot have her PET scan done because it is showing as denied. Insurance told the patient they needed a peer to peer. She would like to speak to someone about what needs to be done so she can get her test done.

## 2023-02-20 ENCOUNTER — Other Ambulatory Visit (HOSPITAL_COMMUNITY): Payer: Self-pay | Admitting: Internal Medicine

## 2023-02-20 DIAGNOSIS — I5032 Chronic diastolic (congestive) heart failure: Secondary | ICD-10-CM

## 2023-02-20 DIAGNOSIS — R079 Chest pain, unspecified: Secondary | ICD-10-CM

## 2023-02-21 ENCOUNTER — Encounter (HOSPITAL_COMMUNITY): Admission: RE | Admit: 2023-02-21 | Payer: 59 | Source: Ambulatory Visit

## 2023-02-21 ENCOUNTER — Encounter: Payer: Self-pay | Admitting: Internal Medicine

## 2023-02-21 ENCOUNTER — Encounter (HOSPITAL_COMMUNITY)
Admission: RE | Admit: 2023-02-21 | Discharge: 2023-02-21 | Disposition: A | Discharge status: Home / Self Care | Payer: 59 | Source: Ambulatory Visit | Attending: Internal Medicine | Admitting: Internal Medicine

## 2023-02-21 DIAGNOSIS — I5032 Chronic diastolic (congestive) heart failure: Secondary | ICD-10-CM

## 2023-02-21 DIAGNOSIS — R079 Chest pain, unspecified: Secondary | ICD-10-CM

## 2023-02-21 MED ORDER — REGADENOSON 0.4 MG/5ML IV SOLN
INTRAVENOUS | Status: AC
  Filled 2023-02-21: qty 5

## 2023-02-21 MED ORDER — RUBIDIUM RB82 GENERATOR (RUBYFILL)
30.0000 | PACK | Freq: Once | INTRAVENOUS | Status: AC
  Administered 2023-02-21: 30 via INTRAVENOUS

## 2023-02-21 MED ORDER — REGADENOSON 0.4 MG/5ML IV SOLN
0.4000 mg | Freq: Once | INTRAVENOUS | Status: AC
  Administered 2023-02-21: 0.4 mg via INTRAVENOUS

## 2023-02-22 LAB — NM PET CT CARDIAC PERFUSION MULTI W/ABSOLUTE BLOODFLOW
MBFR: 2.44
Nuc Rest EF: 60 %
Nuc Stress EF: 70 %
Rest MBF: 1.08 ml/g/min
Rest Nuclear Isotope Dose: 30 mCi
ST Depression (mm): 0 mm
Stress MBF: 2.63 ml/g/min
Stress Nuclear Isotope Dose: 30 mCi
TID: 0.92

## 2023-02-23 ENCOUNTER — Encounter: Payer: Self-pay | Admitting: Internal Medicine

## 2023-02-28 ENCOUNTER — Other Ambulatory Visit: Payer: 59

## 2023-02-28 ENCOUNTER — Other Ambulatory Visit (HOSPITAL_COMMUNITY): Payer: 59

## 2023-03-05 ENCOUNTER — Other Ambulatory Visit: Payer: Self-pay | Admitting: Urology

## 2023-03-05 ENCOUNTER — Telehealth: Payer: 59 | Admitting: Physician Assistant

## 2023-03-05 ENCOUNTER — Encounter: Payer: Self-pay | Admitting: Internal Medicine

## 2023-03-05 DIAGNOSIS — K047 Periapical abscess without sinus: Secondary | ICD-10-CM | POA: Diagnosis not present

## 2023-03-05 MED ORDER — CLINDAMYCIN HCL 300 MG PO CAPS
300.0000 mg | ORAL_CAPSULE | Freq: Three times a day (TID) | ORAL | 0 refills | Status: AC
Start: 2023-03-05 — End: 2023-03-12

## 2023-03-05 NOTE — Progress Notes (Signed)

## 2023-03-06 ENCOUNTER — Inpatient Hospital Stay: Payer: 59 | Admitting: Internal Medicine

## 2023-03-06 ENCOUNTER — Inpatient Hospital Stay: Payer: 59

## 2023-03-07 ENCOUNTER — Telehealth: Payer: Self-pay | Admitting: Internal Medicine

## 2023-03-07 ENCOUNTER — Other Ambulatory Visit: Payer: Self-pay | Admitting: Physician Assistant

## 2023-03-07 NOTE — Progress Notes (Unsigned)
[facility] OFFICE PROGRESS NOTE  [name], MD [address] 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  The ureteral, vascular and all margins of resection were negative for tumor.   CURRENT THERAPY: Adjuvant treatment with immunotherapy with Keytruda 200 Mg IV every 3 weeks. First dose on 02/12/23.   INTERVAL HISTORY: Christina Cameron 57 y.o. female returns to the clinic today for follow-up visit.  She was last seen in the clinic by Dr. Arbutus Ped on 02/13/2023.  She is currently undergoing adjuvant immunotherapy for her history of renal cell carcinoma.  She is status post her first cycle and tolerated well.  She asked to delay her appointment secondary to her husband passing away?  And having a dental infection.  She is currently on antibiotics?  And feeling*** .   She denies any fever, chills, night sweats, or unexplained weight loss.  Fatigue?  Denies any chest pain, shortness of breath, cough, or hemoptysis.  Denies any nausea, vomiting, diarrhea, or constipation.  Denies any headache or visual changes.  Denies any rashes or skin changes.  Denies any malodorous urine, blood in the urine, or cloudy urine.  Denies any unusual back pain or abdominal pain.  She is here today for evaluation repeat blood work for starting cycle #2.        MEDICAL HISTORY: Past Medical History:  Diagnosis Date   Arm vein blood clot, unspecified laterality    around 20 years ago as of 08/10/21, Patient doesn't remember what caused it. She was not put on blood thinners.   Arthritis    right knee   Asthma     Cancer (HCC)    cervical 1989   Cancer of kidney (HCC)    Chest pain    a. normal cors by cath in 11/2014 / 03/02/2020 nuclear stress test demonstrated no perfusion defects consistent with prior infart or current ischemia. Normal study.   Chronic diastolic (congestive) heart failure (HCC)    09/27/20 Echocardiogram in Epic showed LVEF 60 to 65%.   Complication of anesthesia    post-operative nausea & vomiting   COVID-19    05/29/19 & 05/2020 Covid infections   Depression    Diabetes mellitus, type 2 (HCC)    Dysrhythmia    heart palpitations   GERD (gastroesophageal reflux disease)    H/O cardiovascular stress test 03/02/2020   03/02/2020 Nuclear stress test in Epic showed no perusion defects consistent with prior infarct or ischemia - normal study.   Headache    migraines   High cholesterol    pt takes Crestor   History of cardiac radiofrequency ablation    Hypertension    Last cardiology office visit, 10/22/20 with Rennis Harding, NP ( see note in Epic) as of 08/09/21.   Morbid (severe) obesity due to excess calories (HCC)    Neuromuscular disorder (HCC)    severe neuropathy in feet   Osteoarthritis of knee, unspecified    Palpitations    PONV (postoperative nausea and vomiting)    Post-COVID syndrome 05/2019   Pt experienced chest tightness, sob, palpitations & dizziness after Covid infection. See 04/2020  note from Rennis Harding, NP in [place].   PTSD (post-traumatic stress disorder)    Raynaud's syndrome    Sleep apnea 09/24/2020   sleep study indicated moderate sleep apea   SVT (supraventricular tachycardia)    a. s/p ablation by Dr. Ladona Ridgel in 2006.   Wears glasses    prescripton reading glasses    ALLERGIES:  has No Known Allergies.  MEDICATIONS:  Current Outpatient Medications  Medication Sig Dispense Refill   albuterol (VENTOLIN HFA) 108 (90 Base) MCG/ACT inhaler Inhale 1-2 puffs into the lungs every 6 (six) hours as needed for wheezing or shortness of breath. 18 g 0    Azelastine-Fluticasone 137-50 MCG/ACT SUSP Place 1 spray into the nose every 12 (twelve) hours. (Patient not taking: Reported on 01/26/2023) 23 g 0   brexpiprazole (REXULTI) 2 MG TABS tablet Take 2 mg by mouth daily.     buPROPion (WELLBUTRIN XL) 150 MG 24 hr tablet Take 150 mg by mouth every morning.     busPIRone (BUSPAR) 10 MG tablet Take 10 mg by mouth 2 (two) times daily.     cetirizine (ZYRTEC) 10 MG tablet Take 10 mg by mouth daily as needed for allergies. (Patient not taking: Reported on 01/01/2023)     clindamycin (CLEOCIN) 300 MG capsule Take 1 capsule (300 mg total) by mouth 3 (three) times daily for 7 days. 21 capsule 0   clotrimazole-betamethasone (LOTRISONE) cream Apply 1 Application topically 2 (two) times daily. 30 g 3   estradiol (ESTRACE) 0.5 MG tablet Take 0.5 mg by mouth daily.     ezetimibe (ZETIA) 10 MG tablet Take 1 tablet (10 mg total) by mouth daily. 90 tablet 3   furosemide (LASIX) 20 MG tablet Take 1 tablet (20 mg total) by mouth daily. (Patient taking differently: Take 20 mg by mouth daily as needed for fluid or edema.) 90 tablet 3   gabapentin (NEURONTIN) 300 MG capsule Take 1 capsule (300 mg total) by mouth 3 (three) times daily. Start at bedtime and slowly increase to 3x a day as needed to make sure no side effects like sedation. (Patient taking differently: Take 300 mg by mouth at bedtime. Start at bedtime and slowly increase to 3x a day as needed to make sure no side effects like sedation.) 90 capsule 11   hydrOXYzine (ATARAX) 50 MG tablet Take 100 mg by mouth at bedtime.     ibuprofen (ADVIL) 600 MG tablet Take 1 tablet (600 mg total) by mouth every 8 (eight) hours as needed. 30 tablet 0   indapamide (LOZOL) 2.5 MG tablet Take 2.5 mg by mouth daily as needed (fluid).     isosorbide mononitrate (IMDUR) 30 MG 24 hr tablet Take 1 tablet (30 mg total) by mouth daily. (Patient not taking: Reported on 01/01/2023) 90 tablet 3   lamoTRIgine (LAMICTAL) 200 MG tablet Take 200 mg by  mouth daily.     metFORMIN (GLUCOPHAGE) 500 MG tablet Take 1,000 mg by mouth 2 (two) times daily with a meal.     metoprolol tartrate (LOPRESSOR) 50 MG tablet Take 2 tablets (100 mg total) by mouth 2 (two) times daily. 180 tablet 3   mupirocin ointment (BACTROBAN) 2 % Apply 1 Application topically 2 (two) times daily. 30 g 2   nitroGLYCERIN (NITROSTAT) 0.4 MG SL tablet DISSOLVE ONE TABLET UNDER THE TONGUE EVERY 5 MINUTES AS NEEDED FOR CHEST PAIN.  DO NOT EXCEED A TOTAL OF 3 DOSES IN 15 MINUTES 75 tablet 0   oxyCODONE-acetaminophen (PERCOCET) 5-325  MG tablet Take 1 tablet by mouth every 4 (four) hours as needed for severe pain. 30 tablet 0   prochlorperazine (COMPAZINE) 10 MG tablet Take 1 tablet (10 mg total) by mouth every 6 (six) hours as needed for nausea or vomiting. 30 tablet 1   rosuvastatin (CRESTOR) 40 MG tablet Take 1 tablet (40 mg total) by mouth daily. 90 tablet 3   Semaglutide, 2 MG/DOSE, (OZEMPIC, 2 MG/DOSE,) 8 MG/3ML SOPN Inject 2 mg into the skin every 7 (seven) days.     senna (SENOKOT) 8.6 MG TABS tablet Take 1 tablet (8.6 mg total) by mouth daily. 120 tablet 0   sertraline (ZOLOFT) 100 MG tablet Take 200 mg by mouth daily.     sulfamethoxazole-trimethoprim (BACTRIM DS) 800-160 MG tablet Take 1 tablet by mouth 2 (two) times daily. 14 tablet 0   No current facility-administered medications for this visit.    SURGICAL HISTORY:  Past Surgical History:  Procedure Laterality Date   CARDIAC CATHETERIZATION N/A 12/14/2014   Procedure: Right/Left Heart Cath and Coronary Angiography;  Surgeon: Tonny Bollman, MD;  Location: [facility] INVASIVE CV LAB;  Service: Cardiovascular;  Laterality: N/A;   CARDIAC ELECTROPHYSIOLOGY STUDY AND ABLATION     around 2006, Patient states that heart rate was up to 180 bpm.   CESAREAN SECTION     x2 1990, 2005   CYSTOSCOPY  08/17/2021   Procedure: CYSTOSCOPY;  Surgeon: Carrington Clamp, MD;  Location: [facility];  Service: Gynecology;;    ENDOMETRIAL ABLATION     HYSTEROSCOPY WITH D & C N/A 03/11/2021   Procedure: DILATATION AND CURETTAGE /HYSTEROSCOPY;  Surgeon: Marlow Baars, MD;  Location: [facility] OR;  Service: Gynecology;  Laterality: N/A;   OPERATIVE ULTRASOUND N/A 03/11/2021   Procedure: OPERATIVE ULTRASOUND;  Surgeon: Marlow Baars, MD;  Location: [facility] OR;  Service: Gynecology;  Laterality: N/A;  ultrasound guidance needed   ROBOT ASSISTED LAPAROSCOPIC NEPHRECTOMY Right 12/25/2022   Procedure: XI ROBOTIC ASSISTED LAPAROSCOPIC NEPHRECTOMY;  Surgeon: Malen Gauze, MD;  Location: [facility];  Service: Urology;  Laterality: Right;   ROBOTIC ASSISTED LAPAROSCOPIC HYSTERECTOMY AND SALPINGECTOMY Bilateral 08/17/2021   Procedure: XI ROBOTIC ASSISTED LAPAROSCOPIC HYSTERECTOMY AND  BILATERAL SALPINGECTOMY AND OOPHERETOMY;  Surgeon: Carrington Clamp, MD;  Location: [facility];  Service: Gynecology;  Laterality: Bilateral;    REVIEW OF SYSTEMS:   Review of Systems  Constitutional: Negative for appetite change, chills, fatigue, fever and unexpected weight change.  HENT:   Negative for mouth sores, nosebleeds, sore throat and trouble swallowing.   Eyes: Negative for eye problems and icterus.  Respiratory: Negative for cough, hemoptysis, shortness of breath and wheezing.   Cardiovascular: Negative for chest pain and leg swelling.  Gastrointestinal: Negative for abdominal pain, constipation, diarrhea, nausea and vomiting.  Genitourinary: Negative for bladder incontinence, difficulty urinating, dysuria, frequency and hematuria.   Musculoskeletal: Negative for back pain, gait problem, neck pain and neck stiffness.  Skin: Negative for itching and rash.  Neurological: Negative for dizziness, extremity weakness, gait problem, headaches, light-headedness and seizures.  Hematological: Negative for adenopathy. Does not bruise/bleed easily.  Psychiatric/Behavioral: Negative for confusion, depression and sleep disturbance. The patient  is not nervous/anxious.     PHYSICAL EXAMINATION:  There were no vitals taken for this visit.  ECOG PERFORMANCE STATUS: {CHL ONC ECOG [id number]  Physical Exam  Constitutional: Oriented to person, place, and time and well-developed, well-nourished, and in no distress. No distress.  HENT:  Head: Normocephalic and atraumatic.  Mouth/Throat: Oropharynx is clear  and moist. No oropharyngeal exudate.  Eyes: Conjunctivae are normal. Right eye exhibits no discharge. Left eye exhibits no discharge. No scleral icterus.  Neck: Normal range of motion. Neck supple.  Cardiovascular: Normal rate, regular rhythm, normal heart sounds and intact distal pulses.   Pulmonary/Chest: Effort normal and breath sounds normal. No respiratory distress. No wheezes. No rales.  Abdominal: Soft. Bowel sounds are normal. Exhibits no distension and no mass. There is no tenderness.  Musculoskeletal: Normal range of motion. Exhibits no edema.  Lymphadenopathy:    No cervical adenopathy.  Neurological: Alert and oriented to person, place, and time. Exhibits normal muscle tone. Gait normal. Coordination normal.  Skin: Skin is warm and dry. No rash noted. Not diaphoretic. No erythema. No pallor.  Psychiatric: Mood, memory and judgment normal.  Vitals reviewed.  LABORATORY DATA: Lab Results  Component Value Date   WBC 6.3 02/13/2023   HGB 12.9 02/13/2023   HCT 38.8 02/13/2023   MCV 82.9 02/13/2023   PLT 233 02/13/2023      Chemistry      Component Value Date/Time   NA 138 02/13/2023 0803   NA 142 11/08/2022 1530   NA 140 10/19/2011 0919   K 4.1 02/13/2023 0803   K 4.1 10/19/2011 0919   CL 106 02/13/2023 0803   CL 104 10/19/2011 0919   CO2 26 02/13/2023 0803   CO2 24 10/19/2011 0919   BUN 21 (H) 02/13/2023 0803   BUN 16 11/08/2022 1530   BUN 16 10/19/2011 0919   CREATININE 0.93 02/13/2023 0803   CREATININE 0.63 10/19/2011 0919   GLU 297 04/15/2019 0000      Component Value Date/Time   CALCIUM 9.2  02/13/2023 0803   CALCIUM 8.5 10/19/2011 0919   ALKPHOS 51 02/13/2023 0803   ALKPHOS 57 10/19/2011 0919   AST 19 02/13/2023 0803   ALT 19 02/13/2023 0803   ALT 24 10/19/2011 0919   BILITOT 0.3 02/13/2023 0803       RADIOGRAPHIC STUDIES:  NM PET CT CARDIAC PERFUSION MULTI W/ABSOLUTE BLOODFLOW  Result Date: 02/22/2023   LV perfusion is normal. There is no evidence of ischemia. There is no evidence of infarction.   Rest left ventricular function is normal. Rest EF: 60%. Stress left ventricular function is normal. Stress EF: 70%. End diastolic cavity size is normal. End systolic cavity size is normal.   Myocardial blood flow was computed to be 1.67ml/g/min at rest and 2.64ml/g/min at stress. Global myocardial blood flow reserve was 2.44 and was normal.   Coronary calcium was absent on the attenuation correction CT images.   The study is normal. The study is low risk.   Electronically signed by Epifanio Lesches, MD CLINICAL DATA:  This over-read does not include interpretation of cardiac or coronary anatomy or pathology. The interpretation by the cardiologist is attached. COMPARISON:  CT chest 02/08/2023 and cardiac CT 11/02/2021. FINDINGS: Scout view is grossly unremarkable. Liver is decreased in attenuation diffusely. Small hiatal hernia. Degenerative changes in the spine. Tiny pulmonary nodules measure 4 mm or less in size, as on 11/02/2021. Per Fleischner Society guidelines, no follow-up is necessary. IMPRESSION: Hepatic steatosis. Electronically Signed   By: Leanna Battles M.D.   On: 02/21/2023 15:06  CT Chest Wo Contrast  Result Date: 02/12/2023 CLINICAL DATA:  Kidney cancer staging; * Tracking Code: BO * EXAM: CT CHEST WITHOUT CONTRAST TECHNIQUE: Multidetector CT imaging of the chest was performed following the standard protocol without IV contrast. RADIATION DOSE REDUCTION: This exam was performed  according to the departmental dose-optimization program which includes automated exposure  control, adjustment of the mA and/or kV according to patient size and/or use of iterative reconstruction technique. COMPARISON:  CT abdomen and pelvis dated January 09, 2023; heart CT dated November 02, 2021 FINDINGS: Cardiovascular: Normal heart size. No pericardial effusion. Normal caliber thoracic aorta with no significant atherosclerotic disease. No visible coronary artery calcifications. Mediastinum/Nodes: Small hiatal hernia. Thyroid is unremarkable. No enlarged lymph nodes seen in the chest. Lungs/Pleura: Central airways are patent. No consolidation, pleural effusion or pneumothorax. Small solid pulmonary nodule of the right lower lobe measuring 3 mm on series 5 image 88, unchanged in size when compared with prior exam. Upper Abdomen: Hepatic steatosis.  Prior right nephrectomy. Musculoskeletal: No chest wall mass or suspicious bone lesions identified. IMPRESSION: 1. No evidence of metastatic disease in the chest. 2. Small solid pulmonary nodule of the right lower lobe measuring 3 mm, likely benign given greater than 1 year stability. 3. Hepatic steatosis. Electronically Signed   By: Allegra Lai M.D.   On: 02/12/2023 16:35     ASSESSMENT/PLAN:  This is a very pleasant 57 year old Caucasian female diagnosed with stage III (T3a, N0, M0) clear-cell renal cell carcinoma\of the right kidney.  The patient was diagnosed in July 2024.  She is status post robot-assisted right radical nephrectomy.  The final pathology showed clear-cell renal cell carcinoma with focal rhabdoid features, nuclear grade 4, with a tumor size of 10 cm and the tumor extended into the renal sinus fat and renal vein (P T3a).  The margins were negative for tumor.  The patient is currently on adjuvant immunotherapy with Keytruda 200 mg IV every 3 weeks.  She is status post her first cycle and tolerated it well.  The patient was seen with Dr. Arbutus Ped today.  Labs were reviewed.  Recommend that she ***with cycle #2 today scheduled.  We  will see her back for follow-up visit in 3 weeks for evaluation repeat blood work before undergoing her next cycle of treatment.  The patient was advised to call immediately if she has any concerning symptoms in the interval. The patient voices understanding of current disease status and treatment options and is in agreement with the current care plan. All questions were answered. The patient knows to call the clinic with any problems, questions or concerns. We can certainly see the patient much sooner if necessary   No orders of the defined types were placed in this encounter.    I spent {CHL ONC TIME VISIT - ZOXWR:[id number]} counseling the patient face to face. The total time spent in the appointment was {CHL ONC TIME VISIT - BJYNW:[id number]}.  Cliffie Gingras L Mccartney Brucks, PA-C 03/07/23

## 2023-03-07 NOTE — Telephone Encounter (Signed)
Called and spoke with patient's relative regarding rescheduling upcoming appointments. Will be cancelling and rescheduling upcoming appointments, patient will receive a call regarding new appointments dates.

## 2023-03-08 ENCOUNTER — Encounter: Payer: Self-pay | Admitting: Internal Medicine

## 2023-03-08 ENCOUNTER — Other Ambulatory Visit: Payer: 59

## 2023-03-12 ENCOUNTER — Inpatient Hospital Stay: Payer: 59 | Admitting: Physician Assistant

## 2023-03-12 ENCOUNTER — Inpatient Hospital Stay: Payer: 59

## 2023-03-13 ENCOUNTER — Encounter: Payer: Self-pay | Admitting: Internal Medicine

## 2023-03-13 ENCOUNTER — Other Ambulatory Visit: Payer: Self-pay | Admitting: Physician Assistant

## 2023-03-13 DIAGNOSIS — K432 Incisional hernia without obstruction or gangrene: Secondary | ICD-10-CM | POA: Diagnosis not present

## 2023-03-13 DIAGNOSIS — Z09 Encounter for follow-up examination after completed treatment for conditions other than malignant neoplasm: Secondary | ICD-10-CM | POA: Diagnosis not present

## 2023-03-13 DIAGNOSIS — C641 Malignant neoplasm of right kidney, except renal pelvis: Secondary | ICD-10-CM

## 2023-03-13 DIAGNOSIS — E1165 Type 2 diabetes mellitus with hyperglycemia: Secondary | ICD-10-CM | POA: Diagnosis not present

## 2023-03-13 MED ORDER — OXYCODONE-ACETAMINOPHEN 5-325 MG PO TABS: 1.0000 | ORAL_TABLET | ORAL | 0 refills | Status: DC | PRN

## 2023-03-14 ENCOUNTER — Telehealth: Payer: Self-pay | Admitting: Internal Medicine

## 2023-03-14 ENCOUNTER — Encounter: Payer: Self-pay | Admitting: Internal Medicine

## 2023-03-14 NOTE — Telephone Encounter (Signed)
Called patient regarding all upcoming appointments, left a voicemail.

## 2023-03-20 ENCOUNTER — Ambulatory Visit: Payer: 59 | Admitting: Physician Assistant

## 2023-03-20 ENCOUNTER — Other Ambulatory Visit: Payer: 59

## 2023-03-20 DIAGNOSIS — R002 Palpitations: Secondary | ICD-10-CM | POA: Diagnosis not present

## 2023-03-21 ENCOUNTER — Inpatient Hospital Stay: Payer: 59

## 2023-03-21 ENCOUNTER — Inpatient Hospital Stay: Payer: 59 | Attending: Internal Medicine

## 2023-03-21 ENCOUNTER — Other Ambulatory Visit: Payer: Self-pay

## 2023-03-21 ENCOUNTER — Inpatient Hospital Stay (HOSPITAL_BASED_OUTPATIENT_CLINIC_OR_DEPARTMENT_OTHER): Payer: 59 | Admitting: Internal Medicine

## 2023-03-21 DIAGNOSIS — C641 Malignant neoplasm of right kidney, except renal pelvis: Secondary | ICD-10-CM | POA: Diagnosis not present

## 2023-03-21 DIAGNOSIS — K469 Unspecified abdominal hernia without obstruction or gangrene: Secondary | ICD-10-CM | POA: Diagnosis not present

## 2023-03-21 DIAGNOSIS — Z5112 Encounter for antineoplastic immunotherapy: Secondary | ICD-10-CM | POA: Insufficient documentation

## 2023-03-21 DIAGNOSIS — Z9079 Acquired absence of other genital organ(s): Secondary | ICD-10-CM | POA: Insufficient documentation

## 2023-03-21 DIAGNOSIS — Z905 Acquired absence of kidney: Secondary | ICD-10-CM | POA: Insufficient documentation

## 2023-03-21 DIAGNOSIS — Z90710 Acquired absence of both cervix and uterus: Secondary | ICD-10-CM | POA: Diagnosis not present

## 2023-03-21 LAB — CMP (CANCER CENTER ONLY)
ALT: 20 U/L (ref 0–44)
AST: 23 U/L (ref 15–41)
Albumin: 4.1 g/dL (ref 3.5–5.0)
Alkaline Phosphatase: 52 U/L (ref 38–126)
Anion gap: 8 (ref 5–15)
BUN: 18 mg/dL (ref 6–20)
CO2: 27 mmol/L (ref 22–32)
Calcium: 9.5 mg/dL (ref 8.9–10.3)
Chloride: 102 mmol/L (ref 98–111)
Creatinine: 0.91 mg/dL (ref 0.44–1.00)
GFR, Estimated: >60 mL/min (ref >60)
Glucose, Bld: 212 mg/dL — ABNORMAL HIGH (ref 70–99)
Potassium: 4.5 mmol/L (ref 3.5–5.1)
Sodium: 137 mmol/L (ref 135–145)
Total Bilirubin: 0.5 mg/dL (ref 0.3–1.2)
Total Protein: 7.2 g/dL (ref 6.5–8.1)

## 2023-03-21 LAB — CBC WITH DIFFERENTIAL (CANCER CENTER ONLY)
Abs Immature Granulocytes: 0.02 10*3/uL (ref 0.00–0.07)
Basophils Absolute: 0.1 10*3/uL (ref 0.0–0.1)
Basophils Relative: 1 %
Eosinophils Absolute: 0.3 10*3/uL (ref 0.0–0.5)
Eosinophils Relative: 5 %
HCT: 40.2 % (ref 36.0–46.0)
Hemoglobin: 13.2 g/dL (ref 12.0–15.0)
Immature Granulocytes: 0 %
Lymphocytes Relative: 30 %
Lymphs Abs: 1.9 10*3/uL (ref 0.7–4.0)
MCH: 27 pg (ref 26.0–34.0)
MCHC: 32.8 g/dL (ref 30.0–36.0)
MCV: 82.4 fL (ref 80.0–100.0)
Monocytes Absolute: 0.5 10*3/uL (ref 0.1–1.0)
Monocytes Relative: 8 %
Neutro Abs: 3.7 10*3/uL (ref 1.7–7.7)
Neutrophils Relative %: 56 %
Platelet Count: 219 10*3/uL (ref 150–400)
RBC: 4.88 MIL/uL (ref 3.87–5.11)
RDW: 15.1 % (ref 11.5–15.5)
WBC Count: 6.5 10*3/uL (ref 4.0–10.5)
nRBC: 0 % (ref 0.0–0.2)

## 2023-03-21 MED ORDER — SODIUM CHLORIDE 0.9 % IV SOLN: Freq: Once | INTRAVENOUS | Status: AC

## 2023-03-21 MED ORDER — SODIUM CHLORIDE 0.9 % IV SOLN
200.0000 mg | Freq: Once | INTRAVENOUS | Status: AC
  Administered 2023-03-21: 200 mg via INTRAVENOUS
  Filled 2023-03-21: qty 200

## 2023-03-21 NOTE — Patient Instructions (Signed)
[facility] [facility]  Discharge Instructions: Thank you for choosing [facility] to provide your oncology and hematology care.   If you have a lab appointment with the Cancer Center, please go directly to the Cancer Center and check in at the registration area.   Wear comfortable clothing and clothing appropriate for easy access to any Portacath or PICC line.   We strive to give you quality time with your provider. You may need to reschedule your appointment if you arrive late (15 or more minutes).  Arriving late affects you and other patients whose appointments are after yours.  Also, if you miss three or more appointments without notifying the office, you may be dismissed from the clinic at the provider's discretion.      For prescription refill requests, have your pharmacy contact our office and allow 72 hours for refills to be completed.    Today you received the following immunotherapy agents PEMBROLIZUMAB   To help prevent nausea and vomiting after your treatment, we encourage you to take your nausea medication as directed.  BELOW ARE SYMPTOMS THAT SHOULD BE REPORTED IMMEDIATELY: *FEVER GREATER THAN 100.4 F (38 C) OR HIGHER *CHILLS OR SWEATING *NAUSEA AND VOMITING THAT IS NOT CONTROLLED WITH YOUR NAUSEA MEDICATION *UNUSUAL SHORTNESS OF BREATH *UNUSUAL BRUISING OR BLEEDING *URINARY PROBLEMS (pain or burning when urinating, or frequent urination) *BOWEL PROBLEMS (unusual diarrhea, constipation, pain near the anus) TENDERNESS IN MOUTH AND THROAT WITH OR WITHOUT PRESENCE OF ULCERS (sore throat, sores in mouth, or a toothache) UNUSUAL RASH, SWELLING OR PAIN  UNUSUAL VAGINAL DISCHARGE OR ITCHING   Items with * indicate a potential emergency and should be followed up as soon as possible or go to the Emergency Department if any problems should occur.  Please show the CHEMOTHERAPY ALERT CARD or IMMUNOTHERAPY ALERT CARD at check-in to the Emergency  Department and triage nurse.  Should you have questions after your visit or need to cancel or reschedule your appointment, please contact [place] [facility]  Dept: [phone]  and follow the prompts.  Office hours are 8:00 a.m. to 4:30 p.m. Monday - Friday. Please note that voicemails left after 4:00 p.m. may not be returned until the following business day.  We are closed weekends and major holidays. You have access to a nurse at all times for urgent questions. Please call the main number to the clinic Dept: [phone] and follow the prompts.   For any non-urgent questions, you may also contact your provider using MyChart. We now offer e-Visits for anyone 27 and older to request care online for non-urgent symptoms. For details visit [email or web address].   Also download the MyChart app! Go to the app store, search "MyChart", open the app, select [place], and log in with your MyChart username and password.  Pembrolizumab Injection What is this medication? PEMBROLIZUMAB (PEM broe LIZ ue mab) treats some types of cancer. It works by helping your immune system slow or stop the spread of cancer cells. It is a monoclonal antibody. This medicine may be used for other purposes; ask your health care provider or pharmacist if you have questions. COMMON BRAND NAME(S): Keytruda What should I tell my care team before I take this medication? They need to know if you have any of these conditions: Allogeneic stem cell transplant (uses someone else's stem cells) Autoimmune diseases, such as Crohn disease, ulcerative colitis, lupus History of chest radiation Nervous system problems, such as Guillain-Barre  syndrome, myasthenia gravis Organ transplant An unusual or allergic reaction to pembrolizumab, other medications, foods, dyes, or preservatives Pregnant or trying to get pregnant Breast-feeding How should I use this medication? This medication is injected into a  vein. It is given by your care team in a hospital or clinic setting. A special MedGuide will be given to you before each treatment. Be sure to read this information carefully each time. Talk to your care team about the use of this medication in children. While it may be prescribed for children as young as 6 months for selected conditions, precautions do apply. Overdosage: If you think you have taken too much of this medicine contact a poison control center or emergency room at once. NOTE: This medicine is only for you. Do not share this medicine with others. What if I miss a dose? Keep appointments for follow-up doses. It is important not to miss your dose. Call your care team if you are unable to keep an appointment. What may interact with this medication? Interactions have not been studied. This list may not describe all possible interactions. Give your health care provider a list of all the medicines, herbs, non-prescription drugs, or dietary supplements you use. Also tell them if you smoke, drink alcohol, or use illegal drugs. Some items may interact with your medicine. What should I watch for while using this medication? Your condition will be monitored carefully while you are receiving this medication. You may need blood work while taking this medication. This medication may cause serious skin reactions. They can happen weeks to months after starting the medication. Contact your care team right away if you notice fevers or flu-like symptoms with a rash. The rash may be red or purple and then turn into blisters or peeling of the skin. You may also notice a red rash with swelling of the face, lips, or lymph nodes in your neck or under your arms. Tell your care team right away if you have any change in your eyesight. Talk to your care team if you may be pregnant. Serious birth defects can occur if you take this medication during pregnancy and for 4 months after the last dose. You will need a  negative pregnancy test before starting this medication. Contraception is recommended while taking this medication and for 4 months after the last dose. Your care team can help you find the option that works for you. Do not breastfeed while taking this medication and for 4 months after the last dose. What side effects may I notice from receiving this medication? Side effects that you should report to your care team as soon as possible: Allergic reactions--skin rash, itching, hives, swelling of the face, lips, tongue, or throat Dry cough, shortness of breath or trouble breathing Eye pain, redness, irritation, or discharge with blurry or decreased vision Heart muscle inflammation--unusual weakness or fatigue, shortness of breath, chest pain, fast or irregular heartbeat, dizziness, swelling of the ankles, feet, or hands Hormone gland problems--headache, sensitivity to light, unusual weakness or fatigue, dizziness, fast or irregular heartbeat, increased sensitivity to cold or heat, excessive sweating, constipation, hair loss, increased thirst or amount of urine, tremors or shaking, irritability Infusion reactions--chest pain, shortness of breath or trouble breathing, feeling faint or lightheaded Kidney injury (glomerulonephritis)--decrease in the amount of urine, red or dark brown urine, foamy or bubbly urine, swelling of the ankles, hands, or feet Liver injury--right upper belly pain, loss of appetite, nausea, light-colored stool, dark yellow or brown urine, yellowing skin or  eyes, unusual weakness or fatigue Pain, tingling, or numbness in the hands or feet, muscle weakness, change in vision, confusion or trouble speaking, loss of balance or coordination, trouble walking, seizures Rash, fever, and swollen lymph nodes Redness, blistering, peeling, or loosening of the skin, including inside the mouth Sudden or severe stomach pain, bloody diarrhea, fever, nausea, vomiting Side effects that usually do not  require medical attention (report to your care team if they continue or are bothersome): Bone, joint, or muscle pain Diarrhea Fatigue Loss of appetite Nausea Skin rash This list may not describe all possible side effects. Call your doctor for medical advice about side effects. You may report side effects to FDA at 1-800-FDA-1088. Where should I keep my medication? This medication is given in a hospital or clinic. It will not be stored at home. NOTE: This sheet is a summary. It may not cover all possible information. If you have questions about this medicine, talk to your doctor, pharmacist, or health care provider.  2024 Elsevier/Gold Standard (2021-09-27 00:00:00)

## 2023-03-21 NOTE — Progress Notes (Signed)
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  The ureteral, vascular and all margins of resection were negative for tumor.   CURRENT THERAPY: Adjuvant treatment with immunotherapy with Keytruda 200 Mg IV every 3 weeks.  First dose was given on 02/13/2023  INTERVAL HISTORY: Christina Cameron 57 y.o. female returns to the clinic today for follow-up visit.Discussed the use of AI scribe software for clinical note transcription with the patient, who gave verbal consent to proceed.  History of Present Illness   The patient, a 57 year old with a history of stage 3 clear cell renal cell carcinoma, underwent a right nephrectomy in July 2024. She has since started immunotherapy with [facility] and has completed one cycle. The patient reports feeling physically unwell, with persistent fatigue, lethargy, and significant lower back pain, similar to the discomfort experienced prior to her cancer diagnosis. She also has an untreated abdominal hernia on the right side, contributing to her discomfort. The patient is also dealing with significant emotional stress due to the recent loss of her spouse.     MEDICAL HISTORY: Past Medical History:  Diagnosis Date   Arm vein blood clot, unspecified laterality    around 20 years ago as of 08/10/21, Patient doesn't remember what caused it. She was not put on blood thinners.   Arthritis    right knee   Asthma    Cancer (HCC)    cervical 1989   Cancer of  kidney (HCC)    Chest pain    a. normal cors by cath in 11/2014 / 03/02/2020 nuclear stress test demonstrated no perfusion defects consistent with prior infart or current ischemia. Normal study.   Chronic diastolic (congestive) heart failure (HCC)    09/27/20 Echocardiogram in Epic showed LVEF 60 to 65%.   Complication of anesthesia    post-operative nausea & vomiting   COVID-19    05/29/19 & 05/2020 Covid infections   Depression    Diabetes mellitus, type 2 (HCC)    Dysrhythmia    heart palpitations   GERD (gastroesophageal reflux disease)    H/O cardiovascular stress test 03/02/2020   03/02/2020 Nuclear stress test in Epic showed no perusion defects consistent with prior infarct or ischemia - normal study.   Headache    migraines   High cholesterol    pt takes Crestor   History of cardiac radiofrequency ablation    Hypertension    Last cardiology office visit, 10/22/20 with Rennis Harding, NP ( see note in Epic) as of 08/09/21.   Morbid (severe) obesity due to excess calories (HCC)    Neuromuscular disorder (HCC)    severe neuropathy in feet   Osteoarthritis of knee, unspecified    Palpitations    PONV (postoperative nausea and vomiting)    Post-COVID syndrome 05/2019   Pt experienced chest tightness, sob, palpitations & dizziness after Covid infection. See 04/2020 note from Rennis Harding, NP in [place].   PTSD (post-traumatic stress disorder)    Raynaud's syndrome    Sleep  apnea 09/24/2020   sleep study indicated moderate sleep apea   SVT (supraventricular tachycardia)    a. s/p ablation by Dr. Ladona Ridgel in 2006.   Wears glasses    prescripton reading glasses    ALLERGIES:  has No Known Allergies.  MEDICATIONS:  Current Outpatient Medications  Medication Sig Dispense Refill   albuterol (VENTOLIN HFA) 108 (90 Base) MCG/ACT inhaler Inhale 1-2 puffs into the lungs every 6 (six) hours as needed for wheezing or shortness of breath. 18 g 0   Azelastine-Fluticasone 137-50 MCG/ACT SUSP  Place 1 spray into the nose every 12 (twelve) hours. (Patient not taking: Reported on 01/26/2023) 23 g 0   brexpiprazole (REXULTI) 2 MG TABS tablet Take 2 mg by mouth daily.     buPROPion (WELLBUTRIN XL) 150 MG 24 hr tablet Take 150 mg by mouth every morning.     busPIRone (BUSPAR) 10 MG tablet Take 10 mg by mouth 2 (two) times daily.     cetirizine (ZYRTEC) 10 MG tablet Take 10 mg by mouth daily as needed for allergies. (Patient not taking: Reported on 01/01/2023)     clotrimazole-betamethasone (LOTRISONE) cream Apply 1 Application topically 2 (two) times daily. 30 g 3   estradiol (ESTRACE) 0.5 MG tablet Take 0.5 mg by mouth daily.     ezetimibe (ZETIA) 10 MG tablet Take 1 tablet (10 mg total) by mouth daily. 90 tablet 3   furosemide (LASIX) 20 MG tablet Take 1 tablet (20 mg total) by mouth daily. (Patient taking differently: Take 20 mg by mouth daily as needed for fluid or edema.) 90 tablet 3   gabapentin (NEURONTIN) 300 MG capsule Take 1 capsule (300 mg total) by mouth 3 (three) times daily. Start at bedtime and slowly increase to 3x a day as needed to make sure no side effects like sedation. (Patient taking differently: Take 300 mg by mouth at bedtime. Start at bedtime and slowly increase to 3x a day as needed to make sure no side effects like sedation.) 90 capsule 11   hydrOXYzine (ATARAX) 50 MG tablet Take 100 mg by mouth at bedtime.     ibuprofen (ADVIL) 600 MG tablet Take 1 tablet (600 mg total) by mouth every 8 (eight) hours as needed. 30 tablet 0   indapamide (LOZOL) 2.5 MG tablet Take 2.5 mg by mouth daily as needed (fluid).     isosorbide mononitrate (IMDUR) 30 MG 24 hr tablet Take 1 tablet (30 mg total) by mouth daily. (Patient not taking: Reported on 01/01/2023) 90 tablet 3   lamoTRIgine (LAMICTAL) 200 MG tablet Take 200 mg by mouth daily.     metFORMIN (GLUCOPHAGE) 500 MG tablet Take 1,000 mg by mouth 2 (two) times daily with a meal.     metoprolol tartrate (LOPRESSOR) 50 MG tablet Take 2  tablets (100 mg total) by mouth 2 (two) times daily. 180 tablet 3   mupirocin ointment (BACTROBAN) 2 % Apply 1 Application topically 2 (two) times daily. 30 g 2   nitroGLYCERIN (NITROSTAT) 0.4 MG SL tablet DISSOLVE ONE TABLET UNDER THE TONGUE EVERY 5 MINUTES AS NEEDED FOR CHEST PAIN.  DO NOT EXCEED A TOTAL OF 3 DOSES IN 15 MINUTES 75 tablet 0   oxyCODONE-acetaminophen (PERCOCET) 5-325 MG tablet Take 1 tablet by mouth every 4 (four) hours as needed for severe pain (pain score 7-10). 30 tablet 0   prochlorperazine (COMPAZINE) 10 MG tablet Take 1 tablet (10 mg total) by mouth every 6 (six) hours as needed for nausea or vomiting.  30 tablet 1   rosuvastatin (CRESTOR) 40 MG tablet Take 1 tablet (40 mg total) by mouth daily. 90 tablet 3   Semaglutide, 2 MG/DOSE, (OZEMPIC, 2 MG/DOSE,) 8 MG/3ML SOPN Inject 2 mg into the skin every 7 (seven) days.     senna (SENOKOT) 8.6 MG TABS tablet Take 1 tablet (8.6 mg total) by mouth daily. 120 tablet 0   sertraline (ZOLOFT) 100 MG tablet Take 200 mg by mouth daily.     sulfamethoxazole-trimethoprim (BACTRIM DS) 800-160 MG tablet Take 1 tablet by mouth 2 (two) times daily. 14 tablet 0   No current facility-administered medications for this visit.    SURGICAL HISTORY:  Past Surgical History:  Procedure Laterality Date   CARDIAC CATHETERIZATION N/A 12/14/2014   Procedure: Right/Left Heart Cath and Coronary Angiography;  Surgeon: Tonny Bollman, MD;  [facility] INVASIVE CV LAB;  Service: Cardiovascular;  Laterality: N/A;   CARDIAC ELECTROPHYSIOLOGY STUDY AND ABLATION     around 2006, Patient states that heart rate was up to 180 bpm.   CESAREAN SECTION     x2 1990, 2005   CYSTOSCOPY  08/17/2021   Procedure: CYSTOSCOPY;  Surgeon: Carrington Clamp, MD;  Location: [facility];  Service: Gynecology;;   ENDOMETRIAL ABLATION     HYSTEROSCOPY WITH D & C N/A 03/11/2021   Procedure: DILATATION AND CURETTAGE /HYSTEROSCOPY;  Surgeon: Marlow Baars, MD;   Location: [facility] OR;  Service: Gynecology;  Laterality: N/A;   OPERATIVE ULTRASOUND N/A 03/11/2021   Procedure: OPERATIVE ULTRASOUND;  Surgeon: Marlow Baars, MD;  Location: [facility] OR;  Service: Gynecology;  Laterality: N/A;  ultrasound guidance needed   ROBOT ASSISTED LAPAROSCOPIC NEPHRECTOMY Right 12/25/2022   Procedure: XI ROBOTIC ASSISTED LAPAROSCOPIC NEPHRECTOMY;  Surgeon: Malen Gauze, MD;  Location: [facility];  Service: Urology;  Laterality: Right;   ROBOTIC ASSISTED LAPAROSCOPIC HYSTERECTOMY AND SALPINGECTOMY Bilateral 08/17/2021   Procedure: XI ROBOTIC ASSISTED LAPAROSCOPIC HYSTERECTOMY AND  BILATERAL SALPINGECTOMY AND OOPHERETOMY;  Surgeon: Carrington Clamp, MD;  Location: [facility];  Service: Gynecology;  Laterality: Bilateral;    REVIEW OF SYSTEMS:  A comprehensive review of systems was negative except for: Constitutional: positive for fatigue   PHYSICAL EXAMINATION: General appearance: alert, cooperative, fatigued, and no distress Head: Normocephalic, without obvious abnormality, atraumatic Neck: no adenopathy, no JVD, supple, symmetrical, trachea midline, and thyroid not enlarged, symmetric, no tenderness/mass/nodules Lymph nodes: Cervical, supraclavicular, and axillary nodes normal. Resp: clear to auscultation bilaterally Back: symmetric, no curvature. ROM normal. No CVA tenderness. Cardio: regular rate and rhythm, S1, S2 normal, no murmur, click, rub or gallop GI: soft, non-tender; bowel sounds normal; no masses,  no organomegaly Extremities: extremities normal, atraumatic, no cyanosis or edema  ECOG PERFORMANCE STATUS: 1 - Symptomatic but completely ambulatory  Blood pressure (!) 173/90, pulse 74, temperature 98.4 F (36.9 C), temperature source Oral, resp. rate 17, height 5\' 4"  (1.626 m), weight 264 lb 4.8 oz (119.9 kg), SpO2 100%.  LABORATORY DATA: Lab Results  Component Value Date   WBC 6.5 03/21/2023   HGB 13.2 03/21/2023   HCT 40.2 03/21/2023   MCV  82.4 03/21/2023   PLT 219 03/21/2023      Chemistry      Component Value Date/Time   NA 138 02/13/2023 0803   NA 142 11/08/2022 1530   NA 140 10/19/2011 0919   K 4.1 02/13/2023 0803   K 4.1 10/19/2011 0919   CL 106 02/13/2023 0803   CL 104 10/19/2011 0919   CO2 26 02/13/2023 0803  CO2 24 10/19/2011 0919   BUN 21 (H) 02/13/2023 0803   BUN 16 11/08/2022 1530   BUN 16 10/19/2011 0919   CREATININE 0.93 02/13/2023 0803   CREATININE 0.63 10/19/2011 0919   GLU 297 04/15/2019 0000      Component Value Date/Time   CALCIUM 9.2 02/13/2023 0803   CALCIUM 8.5 10/19/2011 0919   ALKPHOS 51 02/13/2023 0803   ALKPHOS 57 10/19/2011 0919   AST 19 02/13/2023 0803   ALT 19 02/13/2023 0803   ALT 24 10/19/2011 0919   BILITOT 0.3 02/13/2023 0803       RADIOGRAPHIC STUDIES: NM PET CT CARDIAC PERFUSION MULTI W/ABSOLUTE BLOODFLOW  Result Date: 02/22/2023   LV perfusion is normal. There is no evidence of ischemia. There is no evidence of infarction.   Rest left ventricular function is normal. Rest EF: 60%. Stress left ventricular function is normal. Stress EF: 70%. End diastolic cavity size is normal. End systolic cavity size is normal.   Myocardial blood flow was computed to be 1.55ml/g/min at rest and 2.67ml/g/min at stress. Global myocardial blood flow reserve was 2.44 and was normal.   Coronary calcium was absent on the attenuation correction CT images.   The study is normal. The study is low risk.   Electronically signed by Epifanio Lesches, MD CLINICAL DATA:  This over-read does not include interpretation of cardiac or coronary anatomy or pathology. The interpretation by the cardiologist is attached. COMPARISON:  CT chest 02/08/2023 and cardiac CT 11/02/2021. FINDINGS: Scout view is grossly unremarkable. Liver is decreased in attenuation diffusely. Small hiatal hernia. Degenerative changes in the spine. Tiny pulmonary nodules measure 4 mm or less in size, as on 11/02/2021. Per Fleischner  Society guidelines, no follow-up is necessary. IMPRESSION: Hepatic steatosis. Electronically Signed   By: Leanna Battles M.D.   On: 02/21/2023 15:06   ASSESSMENT AND PLAN: This is a very pleasant 57 years old white female recently diagnosed with a stage III (T3a, N0, M0) clear-cell renal cell carcinoma of the right kidney diagnosed in July 2024 status post robotic assisted right radical nephrectomy and the final pathology was consistent with clear-cell renal cell carcinoma with focal rhabdoid features, nuclear grade 4 with tumor size of 10.0 cm and tumor extends into the renal sinus fat and renal vein (pT3a).  The ureteral, vascular and all margins of resection were negative for tumor.  She is currently undergoing adjuvant treatment with immunotherapy with Keytruda 200 Mg IV every 3 weeks.  She started the first cycle of her treatment on 02/13/2023.  Status post 1 cycle    Clear Cell Renal Cell Carcinoma, Stage 3 Status post right nephrectomy in July 2024. Currently on immunotherapy with Keytruda, one cycle completed. Reports feeling tired and lethargic, with lower back pain. -Continue Keytruda as planned. -Monitor for side effects and response to treatment.  Abdominal Hernia Patient reports discomfort, surgery planned for January. -Monitor symptoms and manage conservatively until surgery.  Emotional Stress Recent loss of husband, significant emotional distress noted. -Consider referral to mental health services for grief counseling if needed.  Follow-up in 3 weeks for next cycle of Keytruda.   The patient was advised to call immediately if she has any other concerning symptoms in the interval. The patient voices understanding of current disease status and treatment options and is in agreement with the current care plan.  All questions were answered. The patient knows to call the clinic with any problems, questions or concerns. We can certainly see the patient much sooner  if necessary. The  total time spent in the appointment was 20 minutes.  Disclaimer: This note was dictated with voice recognition software. Similar sounding words can inadvertently be transcribed and may not be corrected upon review.

## 2023-03-27 ENCOUNTER — Other Ambulatory Visit: Payer: 59

## 2023-03-27 ENCOUNTER — Ambulatory Visit: Payer: 59

## 2023-03-27 ENCOUNTER — Ambulatory Visit: Payer: 59 | Admitting: Internal Medicine

## 2023-04-06 ENCOUNTER — Telehealth: Payer: Self-pay | Admitting: Internal Medicine

## 2023-04-06 ENCOUNTER — Ambulatory Visit: Payer: 59 | Admitting: Urology

## 2023-04-06 NOTE — Telephone Encounter (Signed)
Called patient regarding upcoming appointments, patient is notified. 

## 2023-04-08 ENCOUNTER — Other Ambulatory Visit: Payer: Self-pay

## 2023-04-08 NOTE — Progress Notes (Unsigned)
[name] OFFICE PROGRESS NOTE  [name], MD [address] [place] [address]  DIAGNOSIS: Stage III (T3a, N0, M0) clear-cell renal cell carcinoma of the right kidney diagnosed in July 2024   PRIOR THERAPY:  status post robotic assisted right radical nephrectomy and the final pathology was consistent with clear-cell renal cell carcinoma with focal rhabdoid features, nuclear grade 4 with tumor size of 10.0 cm and tumor extends into the renal sinus fat and renal vein (pT3a).  The ureteral, vascular and all margins of resection were negative for tumor. She is followed by [facility].   CURRENT THERAPY: Adjuvant treatment with immunotherapy with Keytruda 200 Mg IV every 3 weeks. First dose on 02/12/23. Status post 2 cycles.   INTERVAL HISTORY: Christina Cameron 57 y.o. female returns to the clinic today for a follow-up visit. She was last seen in the clinic by Dr. Arbutus Ped on 03/21/23. She is currently undergoing adjuvant immunotherapy for her history of renal cell carcinoma. She seems to be tolerating immunotherapy well. It looks like at her prior appointment she has been endorsing some fatigue, chronic low back pain, and untreated hernia. She was hopeful to have hernia surgery before the end of the year but with her other health concerns, she is now looking at surgery next year.  For pain she takes Tylenol and uses heating pads.  She has a prescription for oxycodone but tries not to take it unless the pain is significant because she does not like the way it makes her feel.  She reports her fatigue has been ongoing since she was diagnosed with kidney cancer, even prior to her Keytruda.  She denies any fever, chills, night sweats, or unexplained weight loss.  She denies any chest pain, shortness of breath, cough, or hemoptysis.  Denies any nausea, vomiting, or constipation.  She had a few intermittent episodes of diarrhea but nothing persistent or daily.  Denies any rashes or  skin changes.  Denies any malodorous urine, hematuria, or dysuria.  Denies any blood in the stool or abdominal pain.  She continues to have low back pain.  She is here today for evaluation repeat blood work for starting cycle #3.   MEDICAL HISTORY: Past Medical History:  Diagnosis Date   Arm vein blood clot, unspecified laterality    around 20 years ago as of 08/10/21, Patient doesn't remember what caused it. She was not put on blood thinners.   Arthritis    right knee   Asthma    Cancer (HCC)    cervical 1989   Cancer of kidney (HCC)    Chest pain    a. normal cors by cath in 11/2014 / 03/02/2020 nuclear stress test demonstrated no perfusion defects consistent with prior infart or current ischemia. Normal study.   Chronic diastolic (congestive) heart failure (HCC)    09/27/20 Echocardiogram in Epic showed LVEF 60 to 65%.   Complication of anesthesia    post-operative nausea & vomiting   COVID-19    05/29/19 & 05/2020 Covid infections   Depression    Diabetes mellitus, type 2 (HCC)    Dysrhythmia    heart palpitations   GERD (gastroesophageal reflux disease)    H/O cardiovascular stress test 03/02/2020   03/02/2020 Nuclear stress test in Epic showed no perusion defects consistent with prior infarct or ischemia - normal study.   Headache    migraines   High cholesterol    pt takes Crestor   History of cardiac radiofrequency ablation  Hypertension    Last cardiology office visit, 10/22/20 with Rennis Harding, NP ( see note in Epic) as of 08/09/21.   Morbid (severe) obesity due to excess calories (HCC)    Neuromuscular disorder (HCC)    severe neuropathy in feet   Osteoarthritis of knee, unspecified    Palpitations    PONV (postoperative nausea and vomiting)    Post-COVID syndrome 05/2019   Pt experienced chest tightness, sob, palpitations & dizziness after Covid infection. See 04/2020 note from Rennis Harding, NP in [place].   PTSD (post-traumatic stress disorder)    Raynaud's syndrome     Sleep apnea 09/24/2020   sleep study indicated moderate sleep apea   SVT (supraventricular tachycardia)    a. s/p ablation by Dr. Ladona Ridgel in 2006.   Wears glasses    prescripton reading glasses    ALLERGIES:  has No Known Allergies.  MEDICATIONS:  Current Outpatient Medications  Medication Sig Dispense Refill   albuterol (VENTOLIN HFA) 108 (90 Base) MCG/ACT inhaler Inhale 1-2 puffs into the lungs every 6 (six) hours as needed for wheezing or shortness of breath. 18 g 0   Azelastine-Fluticasone 137-50 MCG/ACT SUSP Place 1 spray into the nose every 12 (twelve) hours. (Patient not taking: Reported on 01/26/2023) 23 g 0   brexpiprazole (REXULTI) 2 MG TABS tablet Take 2 mg by mouth daily.     buPROPion (WELLBUTRIN XL) 150 MG 24 hr tablet Take 150 mg by mouth every morning.     busPIRone (BUSPAR) 10 MG tablet Take 10 mg by mouth 2 (two) times daily.     cetirizine (ZYRTEC) 10 MG tablet Take 10 mg by mouth daily as needed for allergies. (Patient not taking: Reported on 01/01/2023)     clotrimazole-betamethasone (LOTRISONE) cream Apply 1 Application topically 2 (two) times daily. 30 g 3   estradiol (ESTRACE) 0.5 MG tablet Take 0.5 mg by mouth daily.     ezetimibe (ZETIA) 10 MG tablet Take 1 tablet (10 mg total) by mouth daily. 90 tablet 3   furosemide (LASIX) 20 MG tablet Take 1 tablet (20 mg total) by mouth daily. (Patient taking differently: Take 20 mg by mouth daily as needed for fluid or edema.) 90 tablet 3   gabapentin (NEURONTIN) 300 MG capsule Take 1 capsule (300 mg total) by mouth 3 (three) times daily. Start at bedtime and slowly increase to 3x a day as needed to make sure no side effects like sedation. (Patient taking differently: Take 300 mg by mouth at bedtime. Start at bedtime and slowly increase to 3x a day as needed to make sure no side effects like sedation.) 90 capsule 11   hydrOXYzine (ATARAX) 50 MG tablet Take 100 mg by mouth at bedtime.     ibuprofen (ADVIL) 600 MG tablet Take 1  tablet (600 mg total) by mouth every 8 (eight) hours as needed. 30 tablet 0   indapamide (LOZOL) 2.5 MG tablet Take 2.5 mg by mouth daily as needed (fluid).     isosorbide mononitrate (IMDUR) 30 MG 24 hr tablet Take 1 tablet (30 mg total) by mouth daily. (Patient not taking: Reported on 01/01/2023) 90 tablet 3   lamoTRIgine (LAMICTAL) 200 MG tablet Take 200 mg by mouth daily.     metFORMIN (GLUCOPHAGE) 500 MG tablet Take 1,000 mg by mouth 2 (two) times daily with a meal.     metoprolol tartrate (LOPRESSOR) 50 MG tablet Take 2 tablets (100 mg total) by mouth 2 (two) times daily. 180 tablet 3  mupirocin ointment (BACTROBAN) 2 % Apply 1 Application topically 2 (two) times daily. 30 g 2   nitroGLYCERIN (NITROSTAT) 0.4 MG SL tablet DISSOLVE ONE TABLET UNDER THE TONGUE EVERY 5 MINUTES AS NEEDED FOR CHEST PAIN.  DO NOT EXCEED A TOTAL OF 3 DOSES IN 15 MINUTES 75 tablet 0   oxyCODONE-acetaminophen (PERCOCET) 5-325 MG tablet Take 1 tablet by mouth every 4 (four) hours as needed for severe pain (pain score 7-10). 30 tablet 0   prochlorperazine (COMPAZINE) 10 MG tablet Take 1 tablet (10 mg total) by mouth every 6 (six) hours as needed for nausea or vomiting. 30 tablet 1   rosuvastatin (CRESTOR) 40 MG tablet Take 1 tablet (40 mg total) by mouth daily. 90 tablet 3   Semaglutide, 2 MG/DOSE, (OZEMPIC, 2 MG/DOSE,) 8 MG/3ML SOPN Inject 2 mg into the skin every 7 (seven) days.     senna (SENOKOT) 8.6 MG TABS tablet Take 1 tablet (8.6 mg total) by mouth daily. 120 tablet 0   sertraline (ZOLOFT) 100 MG tablet Take 200 mg by mouth daily.     sulfamethoxazole-trimethoprim (BACTRIM DS) 800-160 MG tablet Take 1 tablet by mouth 2 (two) times daily. 14 tablet 0   No current facility-administered medications for this visit.    SURGICAL HISTORY:  Past Surgical History:  Procedure Laterality Date   CARDIAC CATHETERIZATION N/A 12/14/2014   Procedure: Right/Left Heart Cath and Coronary Angiography;  Surgeon: Tonny Bollman,  MD;  Location: [facility] INVASIVE CV LAB;  Service: Cardiovascular;  Laterality: N/A;   CARDIAC ELECTROPHYSIOLOGY STUDY AND ABLATION     around 2006, Patient states that heart rate was up to 180 bpm.   CESAREAN SECTION     x2 1990, 2005   CYSTOSCOPY  08/17/2021   Procedure: CYSTOSCOPY;  Surgeon: Carrington Clamp, MD;  Location: [facility];  Service: Gynecology;;   ENDOMETRIAL ABLATION     HYSTEROSCOPY WITH D & C N/A 03/11/2021   Procedure: DILATATION AND CURETTAGE /HYSTEROSCOPY;  Surgeon: Marlow Baars, MD;  Location: [facility] OR;  Service: Gynecology;  Laterality: N/A;   OPERATIVE ULTRASOUND N/A 03/11/2021   Procedure: OPERATIVE ULTRASOUND;  Surgeon: Marlow Baars, MD;  Location: [facility] OR;  Service: Gynecology;  Laterality: N/A;  ultrasound guidance needed   ROBOT ASSISTED LAPAROSCOPIC NEPHRECTOMY Right 12/25/2022   Procedure: XI ROBOTIC ASSISTED LAPAROSCOPIC NEPHRECTOMY;  Surgeon: Malen Gauze, MD;  Location: [facility];  Service: Urology;  Laterality: Right;   ROBOTIC ASSISTED LAPAROSCOPIC HYSTERECTOMY AND SALPINGECTOMY Bilateral 08/17/2021   Procedure: XI ROBOTIC ASSISTED LAPAROSCOPIC HYSTERECTOMY AND  BILATERAL SALPINGECTOMY AND OOPHERETOMY;  Surgeon: Carrington Clamp, MD;  Location: [facility];  Service: Gynecology;  Laterality: Bilateral;    REVIEW OF SYSTEMS:   Review of Systems  Constitutional: Positive for fatigue. Negative for appetite change, chills, fever and unexpected weight change.  HENT: Negative for mouth sores, nosebleeds, sore throat and trouble swallowing.   Eyes: Negative for eye problems and icterus.  Respiratory: Negative for cough, hemoptysis, shortness of breath and wheezing.   Cardiovascular: Negative for chest pain and leg swelling.  Gastrointestinal: Positive for a few intermittent episodes of diarrhea. Abdominal pain from her untreated hernia. Negative for constipation, nausea and vomiting.  Genitourinary: Negative for bladder incontinence,  difficulty urinating, dysuria, frequency and hematuria.   Musculoskeletal: Positive for chronic back pain. Negative for gait problem, neck pain and neck stiffness.  Skin: Negative for itching and rash.  Neurological: Negative for dizziness, extremity weakness, gait problem, headaches, light-headedness and seizures.  Hematological: Negative  for adenopathy. Does not bruise/bleed easily.  Psychiatric/Behavioral: Negative for confusion, depression and sleep disturbance. The patient is not nervous/anxious.     PHYSICAL EXAMINATION:  Blood pressure 139/85, pulse 73, temperature 97.8 F (36.6 C), temperature source Temporal, resp. rate 16, height 5\' 4"  (1.626 m), weight 266 lb 4.8 oz (120.8 kg), SpO2 96%.  ECOG PERFORMANCE STATUS: 1  Physical Exam  Constitutional: Oriented to person, place, and time and well-developed, well-nourished, and in no distress.  HENT:  Head: Normocephalic and atraumatic.  Mouth/Throat: Oropharynx is clear and moist. No oropharyngeal exudate.  Eyes: Conjunctivae are normal. Right eye exhibits no discharge. Left eye exhibits no discharge. No scleral icterus.  Neck: Normal range of motion. Neck supple.  Cardiovascular: Normal rate, regular rhythm, normal heart sounds and intact distal pulses.   Pulmonary/Chest: Effort normal and breath sounds normal. No respiratory distress. No wheezes. No rales.  Abdominal: Soft. Abdominal hernia noted with some tenderness.  Bowel sounds are normal. Exhibits no distension and no mass.  Musculoskeletal: Normal range of motion. Exhibits no edema.  Lymphadenopathy:    No cervical adenopathy.  Neurological: Alert and oriented to person, place, and time. Exhibits normal muscle tone. Gait normal. Coordination normal.  Skin: Skin is warm and dry. No rash noted. Not diaphoretic. No erythema. No pallor.  Psychiatric: Mood, memory and judgment normal.  Vitals reviewed.  LABORATORY DATA: Lab Results  Component Value Date   WBC 7.1 04/11/2023    HGB 13.1 04/11/2023   HCT 39.9 04/11/2023   MCV 83.6 04/11/2023   PLT 207 04/11/2023      Chemistry      Component Value Date/Time   NA 136 04/11/2023 1121   NA 142 11/08/2022 1530   NA 140 10/19/2011 0919   K 4.5 04/11/2023 1121   K 4.1 10/19/2011 0919   CL 102 04/11/2023 1121   CL 104 10/19/2011 0919   CO2 28 04/11/2023 1121   CO2 24 10/19/2011 0919   BUN 22 (H) 04/11/2023 1121   BUN 16 11/08/2022 1530   BUN 16 10/19/2011 0919   CREATININE 0.97 04/11/2023 1121   CREATININE 0.63 10/19/2011 0919   GLU 297 04/15/2019 0000      Component Value Date/Time   CALCIUM 9.2 04/11/2023 1121   CALCIUM 8.5 10/19/2011 0919   ALKPHOS 60 04/11/2023 1121   ALKPHOS 57 10/19/2011 0919   AST 18 04/11/2023 1121   ALT 18 04/11/2023 1121   ALT 24 10/19/2011 0919   BILITOT 0.3 04/11/2023 1121       RADIOGRAPHIC STUDIES:  No results found.   ASSESSMENT/PLAN:  This is a very pleasant 57 year old Caucasian female diagnosed with stage III (T3a, N0, M0) clear-cell renal cell carcinoma\of the right kidney.  The patient was diagnosed in July 2024.  She is status post robot-assisted right radical nephrectomy.  The final pathology showed clear-cell renal cell carcinoma with focal rhabdoid features, nuclear grade 4, with a tumor size of 10 cm and the tumor extended into the renal sinus fat and renal vein (P T3a).  The margins were negative for tumor.   The patient is currently on adjuvant immunotherapy with Keytruda 200 mg IV every 3 weeks.  She is status post 2 cycles of treatment and has been tolerating it fair.      Labs were reviewed.  Recommend that she proceed with cycle #3 today scheduled.   We will see her back for follow-up visit in 3 weeks for evaluation repeat blood work before undergoing her next  cycle of treatment.  The patient was advised to call immediately if she has any concerning symptoms in the interval. The patient voices understanding of current disease status and  treatment options and is in agreement with the current care plan. All questions were answered. The patient knows to call the clinic with any problems, questions or concerns. We can certainly see the patient much sooner if necessary      No orders of the defined types were placed in this encounter.    The total time spent in the appointment was 20-29 minutes  Robson Trickey L Zyonna Vardaman, PA-C 04/11/23

## 2023-04-11 ENCOUNTER — Encounter: Payer: Self-pay | Admitting: Urology

## 2023-04-11 ENCOUNTER — Inpatient Hospital Stay: Payer: 59 | Attending: Internal Medicine

## 2023-04-11 ENCOUNTER — Inpatient Hospital Stay (HOSPITAL_BASED_OUTPATIENT_CLINIC_OR_DEPARTMENT_OTHER): Payer: 59 | Admitting: Physician Assistant

## 2023-04-11 ENCOUNTER — Encounter: Payer: Self-pay | Admitting: Internal Medicine

## 2023-04-11 ENCOUNTER — Inpatient Hospital Stay: Payer: 59

## 2023-04-11 VITALS — BP 139/85 | HR 73 | Temp 97.8°F | Resp 16 | Ht 64.0 in | Wt 266.3 lb

## 2023-04-11 DIAGNOSIS — C641 Malignant neoplasm of right kidney, except renal pelvis: Secondary | ICD-10-CM | POA: Insufficient documentation

## 2023-04-11 DIAGNOSIS — Z5112 Encounter for antineoplastic immunotherapy: Secondary | ICD-10-CM | POA: Insufficient documentation

## 2023-04-11 DIAGNOSIS — Z79899 Other long term (current) drug therapy: Secondary | ICD-10-CM | POA: Insufficient documentation

## 2023-04-11 LAB — CBC WITH DIFFERENTIAL (CANCER CENTER ONLY)
Abs Immature Granulocytes: 0.02 10*3/uL (ref 0.00–0.07)
Basophils Absolute: 0 10*3/uL (ref 0.0–0.1)
Basophils Relative: 1 %
Eosinophils Absolute: 0.2 10*3/uL (ref 0.0–0.5)
Eosinophils Relative: 3 %
HCT: 39.9 % (ref 36.0–46.0)
Hemoglobin: 13.1 g/dL (ref 12.0–15.0)
Immature Granulocytes: 0 %
Lymphocytes Relative: 22 %
Lymphs Abs: 1.6 10*3/uL (ref 0.7–4.0)
MCH: 27.5 pg (ref 26.0–34.0)
MCHC: 32.8 g/dL (ref 30.0–36.0)
MCV: 83.6 fL (ref 80.0–100.0)
Monocytes Absolute: 0.6 10*3/uL (ref 0.1–1.0)
Monocytes Relative: 9 %
Neutro Abs: 4.6 10*3/uL (ref 1.7–7.7)
Neutrophils Relative %: 65 %
Platelet Count: 207 10*3/uL (ref 150–400)
RBC: 4.77 MIL/uL (ref 3.87–5.11)
RDW: 15.2 % (ref 11.5–15.5)
WBC Count: 7.1 10*3/uL (ref 4.0–10.5)
nRBC: 0 % (ref 0.0–0.2)

## 2023-04-11 LAB — CMP (CANCER CENTER ONLY)
ALT: 18 U/L (ref 0–44)
AST: 18 U/L (ref 15–41)
Albumin: 4.2 g/dL (ref 3.5–5.0)
Alkaline Phosphatase: 60 U/L (ref 38–126)
Anion gap: 6 (ref 5–15)
BUN: 22 mg/dL — ABNORMAL HIGH (ref 6–20)
CO2: 28 mmol/L (ref 22–32)
Calcium: 9.2 mg/dL (ref 8.9–10.3)
Chloride: 102 mmol/L (ref 98–111)
Creatinine: 0.97 mg/dL (ref 0.44–1.00)
GFR, Estimated: >60 mL/min (ref >60)
Glucose, Bld: 284 mg/dL — ABNORMAL HIGH (ref 70–99)
Potassium: 4.5 mmol/L (ref 3.5–5.1)
Sodium: 136 mmol/L (ref 135–145)
Total Bilirubin: 0.3 mg/dL (ref <1.2)
Total Protein: 7.4 g/dL (ref 6.5–8.1)

## 2023-04-11 LAB — TSH: TSH: 1.983 u[IU]/mL (ref 0.350–4.500)

## 2023-04-11 MED ORDER — SODIUM CHLORIDE 0.9 % IV SOLN
200.0000 mg | Freq: Once | INTRAVENOUS | Status: AC
  Administered 2023-04-11: 200 mg via INTRAVENOUS
  Filled 2023-04-11: qty 200

## 2023-04-11 MED ORDER — SODIUM CHLORIDE 0.9 % IV SOLN: Freq: Once | INTRAVENOUS | Status: AC

## 2023-04-11 NOTE — Patient Instructions (Signed)

## 2023-04-12 LAB — T4: T4, Total: 7.9 ug/dL (ref 4.5–12.0)

## 2023-04-17 ENCOUNTER — Other Ambulatory Visit: Payer: 59

## 2023-04-17 ENCOUNTER — Ambulatory Visit: Payer: 59

## 2023-04-17 ENCOUNTER — Ambulatory Visit: Payer: 59 | Admitting: Internal Medicine

## 2023-04-18 ENCOUNTER — Telehealth: Payer: Self-pay

## 2023-04-18 ENCOUNTER — Ambulatory Visit: Payer: 59 | Admitting: Urology

## 2023-04-18 NOTE — Telephone Encounter (Signed)
Pt called due to possible kidney infection, and lower back pain. Pt denies fever and will be out of town until 12pm today. Called Pt with no answer left vm to return call and to confirm office visit at 2:30pm with Dr. Ronne Binning.

## 2023-04-19 ENCOUNTER — Other Ambulatory Visit: Payer: Self-pay

## 2023-04-19 NOTE — Telephone Encounter (Signed)
Please see below.

## 2023-04-19 NOTE — Telephone Encounter (Signed)
Patient could not make appt yesterday to be evaluated.  Please see pt message below and advise on how to proceed.  No available appts until 12/12 with Sarah.

## 2023-04-20 ENCOUNTER — Other Ambulatory Visit: Payer: Self-pay

## 2023-04-24 ENCOUNTER — Telehealth: Payer: 59 | Admitting: Family Medicine

## 2023-04-24 DIAGNOSIS — R3989 Other symptoms and signs involving the genitourinary system: Secondary | ICD-10-CM

## 2023-04-24 NOTE — Progress Notes (Signed)
Because you have a possible UTI and yeast and renal cancer- getting active treatment we your condition warrants further evaluation and I recommend that you be seen in a face to face visit by your PCP and or at the local urgent care so a sample can be collected.   NOTE: There will be NO CHARGE for this eVisit   If you are having a true medical emergency please call 911.

## 2023-04-25 ENCOUNTER — Ambulatory Visit: Payer: 59

## 2023-04-30 ENCOUNTER — Ambulatory Visit: Payer: 59

## 2023-04-30 ENCOUNTER — Inpatient Hospital Stay: Payer: 59

## 2023-04-30 ENCOUNTER — Ambulatory Visit: Payer: 59 | Admitting: Physician Assistant

## 2023-04-30 ENCOUNTER — Inpatient Hospital Stay: Payer: 59 | Admitting: Physician Assistant

## 2023-04-30 ENCOUNTER — Other Ambulatory Visit: Payer: 59

## 2023-05-01 ENCOUNTER — Other Ambulatory Visit: Payer: 59

## 2023-05-01 NOTE — Progress Notes (Signed)
[facility] OFFICE PROGRESS NOTE  [name], MD [address] [place] [address]  DIAGNOSIS: Stage III (T3a, N0, M0) clear-cell renal cell carcinoma of the right kidney diagnosed in July 2024   PRIOR THERAPY: status post robotic assisted right radical nephrectomy and the final pathology was consistent with clear-cell renal cell carcinoma with focal rhabdoid features, nuclear grade 4 with tumor size of 10.0 cm and tumor extends into the renal sinus fat and renal vein (pT3a).  The ureteral, vascular and all margins of resection were negative for tumor. She is followed by [facility].   CURRENT THERAPY:  Adjuvant treatment with immunotherapy with Keytruda 200 Mg IV every 3 weeks. First dose on 02/12/23. Status post 3 cycles.   INTERVAL HISTORY: Christina Cameron 57 y.o. female returns to the clinic today for a follow-up visit. She was last seen in the clinic by myself on 04/11/23.  She is currently undergoing adjuvant immunotherapy for her history of renal cell carcinoma. She is wondering if this will be monitored for recurrence with imaging. In the interval since last being seen, she was treated for a UTI. This has resolved at this time and she denies malodorous urine, hematuria, or dysuria.     She denies any fever, chills, night sweats, or unexplained weight loss. She does not have a very good appetite. She reports early satiety. She denies any chest pain, shortness of breath, cough, or hemoptysis.  Denies any nausea, vomiting, or constipation.  She had a few intermittent episodes of diarrhea (about 2 total in a 3 week cycle) but nothing persistent or daily 2 times.  Denies any rashes or skin changes.  Denies any blood in the stool or abdominal pain.  She continues to have low back pain. She also has a hernia that she is going to likely undergo surgery for next year. She has an appointment in January 2025. She uses tylenol and a heating pad for pain. She has a prescription  for oxycodone but tries not to take it unless needed.   She is here today for evaluation repeat blood work for starting cycle #4.   MEDICAL HISTORY: Past Medical History:  Diagnosis Date   Arm vein blood clot, unspecified laterality    around 20 years ago as of 08/10/21, Patient doesn't remember what caused it. She was not put on blood thinners.   Arthritis    right knee   Asthma    Cancer (HCC)    cervical 1989   Cancer of kidney (HCC)    Chest pain    a. normal cors by cath in 11/2014 / 03/02/2020 nuclear stress test demonstrated no perfusion defects consistent with prior infart or current ischemia. Normal study.   Chronic diastolic (congestive) heart failure (HCC)    09/27/20 Echocardiogram in Epic showed LVEF 60 to 65%.   Complication of anesthesia    post-operative nausea & vomiting   COVID-19    05/29/19 & 05/2020 Covid infections   Depression    Diabetes mellitus, type 2 (HCC)    Dysrhythmia    heart palpitations   GERD (gastroesophageal reflux disease)    H/O cardiovascular stress test 03/02/2020   03/02/2020 Nuclear stress test in Epic showed no perusion defects consistent with prior infarct or ischemia - normal study.   Headache    migraines   High cholesterol    pt takes Crestor   History of cardiac radiofrequency ablation    Hypertension    Last cardiology office visit,  10/22/20 with Rennis Harding, NP ( see note in Epic) as of 08/09/21.   Morbid (severe) obesity due to excess calories (HCC)    Neuromuscular disorder (HCC)    severe neuropathy in feet   Osteoarthritis of knee, unspecified    Palpitations    PONV (postoperative nausea and vomiting)    Post-COVID syndrome 05/2019   Pt experienced chest tightness, sob, palpitations & dizziness after Covid infection. See 04/2020 note from Rennis Harding, NP in [place].   PTSD (post-traumatic stress disorder)    Raynaud's syndrome    Sleep apnea 09/24/2020   sleep study indicated moderate sleep apea   SVT (supraventricular  tachycardia)    a. s/p ablation by Dr. Ladona Ridgel in 2006.   Wears glasses    prescripton reading glasses    ALLERGIES:  has No Known Allergies.  MEDICATIONS:  Current Outpatient Medications  Medication Sig Dispense Refill   albuterol (VENTOLIN HFA) 108 (90 Base) MCG/ACT inhaler Inhale 1-2 puffs into the lungs every 6 (six) hours as needed for wheezing or shortness of breath. 18 g 0   Azelastine-Fluticasone 137-50 MCG/ACT SUSP Place 1 spray into the nose every 12 (twelve) hours. (Patient not taking: Reported on 01/26/2023) 23 g 0   brexpiprazole (REXULTI) 2 MG TABS tablet Take 2 mg by mouth daily.     buPROPion (WELLBUTRIN XL) 150 MG 24 hr tablet Take 150 mg by mouth every morning.     busPIRone (BUSPAR) 10 MG tablet Take 10 mg by mouth 2 (two) times daily.     cetirizine (ZYRTEC) 10 MG tablet Take 10 mg by mouth daily as needed for allergies. (Patient not taking: Reported on 01/01/2023)     clotrimazole-betamethasone (LOTRISONE) cream Apply 1 Application topically 2 (two) times daily. 30 g 3   estradiol (ESTRACE) 0.5 MG tablet Take 0.5 mg by mouth daily.     ezetimibe (ZETIA) 10 MG tablet Take 1 tablet (10 mg total) by mouth daily. 90 tablet 3   furosemide (LASIX) 20 MG tablet Take 1 tablet (20 mg total) by mouth daily. (Patient taking differently: Take 20 mg by mouth daily as needed for fluid or edema.) 90 tablet 3   gabapentin (NEURONTIN) 300 MG capsule Take 1 capsule (300 mg total) by mouth 3 (three) times daily. Start at bedtime and slowly increase to 3x a day as needed to make sure no side effects like sedation. (Patient taking differently: Take 300 mg by mouth at bedtime. Start at bedtime and slowly increase to 3x a day as needed to make sure no side effects like sedation.) 90 capsule 11   hydrOXYzine (ATARAX) 50 MG tablet Take 100 mg by mouth at bedtime.     ibuprofen (ADVIL) 600 MG tablet Take 1 tablet (600 mg total) by mouth every 8 (eight) hours as needed. 30 tablet 0   indapamide (LOZOL)  2.5 MG tablet Take 2.5 mg by mouth daily as needed (fluid).     isosorbide mononitrate (IMDUR) 30 MG 24 hr tablet Take 1 tablet (30 mg total) by mouth daily. (Patient not taking: Reported on 01/01/2023) 90 tablet 3   lamoTRIgine (LAMICTAL) 200 MG tablet Take 200 mg by mouth daily.     metFORMIN (GLUCOPHAGE) 500 MG tablet Take 1,000 mg by mouth 2 (two) times daily with a meal.     metoprolol tartrate (LOPRESSOR) 50 MG tablet Take 2 tablets (100 mg total) by mouth 2 (two) times daily. 180 tablet 3   mupirocin ointment (BACTROBAN) 2 % Apply 1 Application  topically 2 (two) times daily. 30 g 2   nitroGLYCERIN (NITROSTAT) 0.4 MG SL tablet DISSOLVE ONE TABLET UNDER THE TONGUE EVERY 5 MINUTES AS NEEDED FOR CHEST PAIN.  DO NOT EXCEED A TOTAL OF 3 DOSES IN 15 MINUTES 75 tablet 0   oxyCODONE-acetaminophen (PERCOCET) 5-325 MG tablet Take 1 tablet by mouth every 4 (four) hours as needed for severe pain (pain score 7-10). 30 tablet 0   prochlorperazine (COMPAZINE) 10 MG tablet Take 1 tablet (10 mg total) by mouth every 6 (six) hours as needed for nausea or vomiting. 30 tablet 1   rosuvastatin (CRESTOR) 40 MG tablet Take 1 tablet (40 mg total) by mouth daily. 90 tablet 3   Semaglutide, 2 MG/DOSE, (OZEMPIC, 2 MG/DOSE,) 8 MG/3ML SOPN Inject 2 mg into the skin every 7 (seven) days.     senna (SENOKOT) 8.6 MG TABS tablet Take 1 tablet (8.6 mg total) by mouth daily. 120 tablet 0   sertraline (ZOLOFT) 100 MG tablet Take 200 mg by mouth daily.     sulfamethoxazole-trimethoprim (BACTRIM DS) 800-160 MG tablet Take 1 tablet by mouth 2 (two) times daily. 14 tablet 0   No current facility-administered medications for this visit.    SURGICAL HISTORY:  Past Surgical History:  Procedure Laterality Date   CARDIAC CATHETERIZATION N/A 12/14/2014   Procedure: Right/Left Heart Cath and Coronary Angiography;  Surgeon: Tonny Bollman, MD;  Location: [facility] INVASIVE CV LAB;  Service: Cardiovascular;  Laterality: N/A;   CARDIAC  ELECTROPHYSIOLOGY STUDY AND ABLATION     around 2006, Patient states that heart rate was up to 180 bpm.   CESAREAN SECTION     x2 1990, 2005   CYSTOSCOPY  08/17/2021   Procedure: CYSTOSCOPY;  Surgeon: Carrington Clamp, MD;  Location: [facility];  Service: Gynecology;;   ENDOMETRIAL ABLATION     HYSTEROSCOPY WITH D & C N/A 03/11/2021   Procedure: DILATATION AND CURETTAGE /HYSTEROSCOPY;  Surgeon: Marlow Baars, MD;  Location: [facility] OR;  Service: Gynecology;  Laterality: N/A;   OPERATIVE ULTRASOUND N/A 03/11/2021   Procedure: OPERATIVE ULTRASOUND;  Surgeon: Marlow Baars, MD;  Location: [facility] OR;  Service: Gynecology;  Laterality: N/A;  ultrasound guidance needed   ROBOT ASSISTED LAPAROSCOPIC NEPHRECTOMY Right 12/25/2022   Procedure: XI ROBOTIC ASSISTED LAPAROSCOPIC NEPHRECTOMY;  Surgeon: Malen Gauze, MD;  Location: [facility];  Service: Urology;  Laterality: Right;   ROBOTIC ASSISTED LAPAROSCOPIC HYSTERECTOMY AND SALPINGECTOMY Bilateral 08/17/2021   Procedure: XI ROBOTIC ASSISTED LAPAROSCOPIC HYSTERECTOMY AND  BILATERAL SALPINGECTOMY AND OOPHERETOMY;  Surgeon: Carrington Clamp, MD;  Location: [facility];  Service: Gynecology;  Laterality: Bilateral;    REVIEW OF SYSTEMS:   Constitutional: Positive for fatigue. Negative for appetite change, chills, fever and unexpected weight change.  HENT: Negative for mouth sores, nosebleeds, sore throat and trouble swallowing.   Eyes: Negative for eye problems and icterus.  Respiratory: Negative for cough, hemoptysis, shortness of breath and wheezing.   Cardiovascular: Negative for chest pain and leg swelling.  Gastrointestinal: Positive for a few intermittent episodes of diarrhea (2 total in 3 week cycle). Abdominal pain from her untreated hernia. Negative for constipation, nausea and vomiting.  Genitourinary: Negative for bladder incontinence, difficulty urinating, dysuria, frequency and hematuria.   Musculoskeletal: Positive  for chronic back pain. Negative for gait problem, neck pain and neck stiffness.  Skin: Negative for itching and rash.  Neurological: Negative for dizziness, extremity weakness, gait problem, headaches, light-headedness and seizures.  Hematological: Negative for adenopathy. Does not bruise/bleed easily.  Psychiatric/Behavioral: Negative for confusion, depression and sleep disturbance. The patient is not nervous/anxious.   PHYSICAL EXAMINATION:  There were no vitals taken for this visit.  ECOG PERFORMANCE STATUS: 1  Physical Exam  Constitutional: Oriented to person, place, and time and well-developed, well-nourished, and in no distress. HENT:  Head: Normocephalic and atraumatic.  Mouth/Throat: Oropharynx is clear and moist. No oropharyngeal exudate.  Eyes: Conjunctivae are normal. Right eye exhibits no discharge. Left eye exhibits no discharge. No scleral icterus.  Neck: Normal range of motion. Neck supple.  Cardiovascular: Normal rate, regular rhythm, normal heart sounds and intact distal pulses.   Pulmonary/Chest: Effort normal and breath sounds normal. No respiratory distress. No wheezes. No rales.  Abdominal: Soft. Bowel sounds are normal. Exhibits no distension and no mass. There is no tenderness.  Musculoskeletal: Normal range of motion. Exhibits no edema.  Lymphadenopathy:    No cervical adenopathy.  Neurological: Alert and oriented to person, place, and time. Exhibits normal muscle tone. Gait normal. Coordination normal.  Skin: Skin is warm and dry. No rash noted. Not diaphoretic. No erythema. No pallor.  Psychiatric: Mood, memory and judgment normal.  Vitals reviewed.  LABORATORY DATA: Lab Results  Component Value Date   WBC 7.1 04/11/2023   HGB 13.1 04/11/2023   HCT 39.9 04/11/2023   MCV 83.6 04/11/2023   PLT 207 04/11/2023      Chemistry      Component Value Date/Time   NA 136 04/11/2023 1121   NA 142 11/08/2022 1530   NA 140 10/19/2011 0919   K 4.5 04/11/2023  1121   K 4.1 10/19/2011 0919   CL 102 04/11/2023 1121   CL 104 10/19/2011 0919   CO2 28 04/11/2023 1121   CO2 24 10/19/2011 0919   BUN 22 (H) 04/11/2023 1121   BUN 16 11/08/2022 1530   BUN 16 10/19/2011 0919   CREATININE 0.97 04/11/2023 1121   CREATININE 0.63 10/19/2011 0919   GLU 297 04/15/2019 0000      Component Value Date/Time   CALCIUM 9.2 04/11/2023 1121   CALCIUM 8.5 10/19/2011 0919   ALKPHOS 60 04/11/2023 1121   ALKPHOS 57 10/19/2011 0919   AST 18 04/11/2023 1121   ALT 18 04/11/2023 1121   ALT 24 10/19/2011 0919   BILITOT 0.3 04/11/2023 1121       RADIOGRAPHIC STUDIES:  No results found.   ASSESSMENT/PLAN:  This is a very pleasant 57 year old Caucasian female diagnosed with stage III (T3a, N0, M0) clear-cell renal cell carcinoma\of the right kidney.  The patient was diagnosed in July 2024.  She is status post robot-assisted right radical nephrectomy.  The final pathology showed clear-cell renal cell carcinoma with focal rhabdoid features, nuclear grade 4, with a tumor size of 10 cm and the tumor extended into the renal sinus fat and renal vein (P T3a).  The margins were negative for tumor.   The patient is currently on adjuvant immunotherapy with Keytruda 200 mg IV every 3 weeks.  She is status post 3 cycles of treatment and has been tolerating it fair.      Labs were reviewed.  Recommend that she proceed with cycle #4 today scheduled.  We will see her back for follow-up visit in 3 weeks for evaluation repeat blood work before undergoing her next cycle of treatment.   I reassured the patient she would have surveillance imaging scans. She likely will have a repeat CT scan in the middle of her adjuvant treatment.   She will see her  surgeon in January regarding her hernia. This is interfering with her quality of  life. If she needed to undergo surgery, to please let us know if we needed to adjust her schedule.   Speaking of her schedule, her next infusion is scheduled  on 12/23. This is too early. Therefore, we discussed moving her next infusion until 12/30. She would be due for treatment around 12/27 but she does not want to come 2 days after Christmas.   The patient was advised to call immediately if she has any concerning symptoms in the interval. The patient voices understanding of current disease status and treatment options and is in agreement with the current care plan. All questions were answered. The patient knows to call the clinic with any problems, questions or concerns. We can certainly see the patient much sooner if necessary     No orders of the defined types were placed in this encounter.    The total time spent in the appointment was 20-29 minutes  Marsden Zaino L Kanna Dafoe, PA-C 05/01/23

## 2023-05-04 ENCOUNTER — Inpatient Hospital Stay: Payer: 59 | Attending: Internal Medicine

## 2023-05-04 ENCOUNTER — Inpatient Hospital Stay: Payer: 59

## 2023-05-04 ENCOUNTER — Encounter: Payer: Self-pay | Admitting: Internal Medicine

## 2023-05-04 ENCOUNTER — Inpatient Hospital Stay (HOSPITAL_BASED_OUTPATIENT_CLINIC_OR_DEPARTMENT_OTHER): Payer: 59 | Admitting: Physician Assistant

## 2023-05-04 VITALS — BP 148/75 | HR 78 | Temp 97.8°F | Resp 16 | Wt 265.5 lb

## 2023-05-04 DIAGNOSIS — E1165 Type 2 diabetes mellitus with hyperglycemia: Secondary | ICD-10-CM | POA: Insufficient documentation

## 2023-05-04 DIAGNOSIS — Z5112 Encounter for antineoplastic immunotherapy: Secondary | ICD-10-CM

## 2023-05-04 DIAGNOSIS — Z79899 Other long term (current) drug therapy: Secondary | ICD-10-CM | POA: Insufficient documentation

## 2023-05-04 DIAGNOSIS — C641 Malignant neoplasm of right kidney, except renal pelvis: Secondary | ICD-10-CM

## 2023-05-04 DIAGNOSIS — Z905 Acquired absence of kidney: Secondary | ICD-10-CM | POA: Diagnosis not present

## 2023-05-04 DIAGNOSIS — K469 Unspecified abdominal hernia without obstruction or gangrene: Secondary | ICD-10-CM | POA: Diagnosis not present

## 2023-05-04 LAB — CMP (CANCER CENTER ONLY)
ALT: 20 U/L (ref 0–44)
AST: 22 U/L (ref 15–41)
Albumin: 4 g/dL (ref 3.5–5.0)
Alkaline Phosphatase: 52 U/L (ref 38–126)
Anion gap: 9 (ref 5–15)
BUN: 15 mg/dL (ref 6–20)
CO2: 23 mmol/L (ref 22–32)
Calcium: 8.6 mg/dL — ABNORMAL LOW (ref 8.9–10.3)
Chloride: 106 mmol/L (ref 98–111)
Creatinine: 0.9 mg/dL (ref 0.44–1.00)
GFR, Estimated: >60 mL/min (ref >60)
Glucose, Bld: 174 mg/dL — ABNORMAL HIGH (ref 70–99)
Potassium: 3.7 mmol/L (ref 3.5–5.1)
Sodium: 138 mmol/L (ref 135–145)
Total Bilirubin: 0.5 mg/dL (ref <1.2)
Total Protein: 6.8 g/dL (ref 6.5–8.1)

## 2023-05-04 LAB — CBC WITH DIFFERENTIAL (CANCER CENTER ONLY)
Abs Immature Granulocytes: 0.04 10*3/uL (ref 0.00–0.07)
Basophils Absolute: 0.1 10*3/uL (ref 0.0–0.1)
Basophils Relative: 1 %
Eosinophils Absolute: 0.3 10*3/uL (ref 0.0–0.5)
Eosinophils Relative: 5 %
HCT: 40.2 % (ref 36.0–46.0)
Hemoglobin: 13.4 g/dL (ref 12.0–15.0)
Immature Granulocytes: 1 %
Lymphocytes Relative: 24 %
Lymphs Abs: 1.4 10*3/uL (ref 0.7–4.0)
MCH: 28.4 pg (ref 26.0–34.0)
MCHC: 33.3 g/dL (ref 30.0–36.0)
MCV: 85.2 fL (ref 80.0–100.0)
Monocytes Absolute: 0.7 10*3/uL (ref 0.1–1.0)
Monocytes Relative: 12 %
Neutro Abs: 3.3 10*3/uL (ref 1.7–7.7)
Neutrophils Relative %: 57 %
Platelet Count: 189 10*3/uL (ref 150–400)
RBC: 4.72 MIL/uL (ref 3.87–5.11)
RDW: 14.6 % (ref 11.5–15.5)
WBC Count: 5.7 10*3/uL (ref 4.0–10.5)
nRBC: 0 % (ref 0.0–0.2)

## 2023-05-04 MED ORDER — HEPARIN SOD (PORK) LOCK FLUSH 100 UNIT/ML IV SOLN: 500.0000 [IU] | Freq: Once | INTRAVENOUS | Status: DC | PRN

## 2023-05-04 MED ORDER — SODIUM CHLORIDE 0.9 % IV SOLN: Freq: Once | INTRAVENOUS | Status: AC

## 2023-05-04 MED ORDER — SODIUM CHLORIDE 0.9 % IV SOLN
200.0000 mg | Freq: Once | INTRAVENOUS | Status: AC
  Administered 2023-05-04: 200 mg via INTRAVENOUS
  Filled 2023-05-04: qty 200

## 2023-05-04 MED ORDER — SODIUM CHLORIDE 0.9% FLUSH: 10.0000 mL | INTRAVENOUS | Status: DC | PRN

## 2023-05-04 NOTE — Patient Instructions (Signed)

## 2023-05-07 ENCOUNTER — Ambulatory Visit: Payer: 59 | Admitting: Urology

## 2023-05-14 ENCOUNTER — Encounter: Payer: Self-pay | Admitting: Internal Medicine

## 2023-05-17 ENCOUNTER — Other Ambulatory Visit: Payer: Self-pay

## 2023-05-17 ENCOUNTER — Ambulatory Visit: Payer: 59 | Admitting: Internal Medicine

## 2023-05-21 ENCOUNTER — Inpatient Hospital Stay: Payer: 59

## 2023-05-21 ENCOUNTER — Inpatient Hospital Stay: Payer: 59 | Admitting: Internal Medicine

## 2023-05-22 ENCOUNTER — Ambulatory Visit
Admission: EM | Admit: 2023-05-22 | Discharge: 2023-05-22 | Disposition: A | Discharge status: Home / Self Care | Payer: 59 | Attending: Family Medicine | Admitting: Family Medicine

## 2023-05-22 DIAGNOSIS — S1086XA Insect bite of other specified part of neck, initial encounter: Secondary | ICD-10-CM

## 2023-05-22 DIAGNOSIS — L989 Disorder of the skin and subcutaneous tissue, unspecified: Secondary | ICD-10-CM

## 2023-05-22 DIAGNOSIS — W57XXXA Bitten or stung by nonvenomous insect and other nonvenomous arthropods, initial encounter: Secondary | ICD-10-CM

## 2023-05-22 MED ORDER — MUPIROCIN 2 % EX OINT: 1.0000 | TOPICAL_OINTMENT | Freq: Two times a day (BID) | CUTANEOUS | 0 refills | Status: DC

## 2023-05-22 NOTE — ED Triage Notes (Signed)
Pt reports she has a tick on the back right side of her neck x 1 day. Noticed this morning.

## 2023-05-22 NOTE — ED Provider Notes (Signed)
[facility]    CSN: [id number] Arrival date & time: 05/22/23  1123      History   Chief Complaint No chief complaint on file.   HPI Christina Cameron is a 57 y.o. female.   Presenting today with a tick to the right side of neck that she first noticed this morning.  States [place] yesterday which is when he must have come on her.  Denies bleeding, drainage, fever, chills, rashes.  So for not trying anything over-the-counter for symptoms.    Past Medical History:  Diagnosis Date   Arm vein blood clot, unspecified laterality    around 20 years ago as of 08/10/21, Patient doesn't remember what caused it. She was not put on blood thinners.   Arthritis    right knee   Asthma    Cancer (HCC)    cervical 1989   Cancer of kidney (HCC)    Chest pain    a. normal cors by cath in 11/2014 / 03/02/2020 nuclear stress test demonstrated no perfusion defects consistent with prior infart or current ischemia. Normal study.   Chronic diastolic (congestive) heart failure (HCC)    09/27/20 Echocardiogram in Epic showed LVEF 60 to 65%.   Complication of anesthesia    post-operative nausea & vomiting   COVID-19    05/29/19 & 05/2020 Covid infections   Depression    Diabetes mellitus, type 2 (HCC)    Dysrhythmia    heart palpitations   GERD (gastroesophageal reflux disease)    H/O cardiovascular stress test 03/02/2020   03/02/2020 Nuclear stress test in Epic showed no perusion defects consistent with prior infarct or ischemia - normal study.   Headache    migraines   High cholesterol    pt takes Crestor   History of cardiac radiofrequency ablation    Hypertension    Last cardiology office visit, 10/22/20 with Rennis Harding, NP ( see note in Epic) as of 08/09/21.   Morbid (severe) obesity due to excess calories (HCC)    Neuromuscular disorder (HCC)    severe neuropathy in feet   Osteoarthritis of knee, unspecified    Palpitations    PONV (postoperative nausea  and vomiting)    Post-COVID syndrome 05/2019   Pt experienced chest tightness, sob, palpitations & dizziness after Covid infection. See 04/2020 note from Rennis Harding, NP in [place].   PTSD (post-traumatic stress disorder)    Raynaud's syndrome    Sleep apnea 09/24/2020   sleep study indicated moderate sleep apea   SVT (supraventricular tachycardia) (HCC)    a. s/p ablation by Dr. Ladona Ridgel in 2006.   Wears glasses    prescripton reading glasses    Patient Active Problem List   Diagnosis Date Noted   Encounter for antineoplastic immunotherapy 04/11/2023   Primary renal cell carcinoma of right kidney (HCC) 01/30/2023   History of right radical nephrectomy 12/28/2022   Right renal mass 12/25/2022   Cardiac chest pain 05/01/2022   Moderate obstructive sleep apnea 01/23/2022   Heart failure with preserved ejection fraction (HCC) 09/19/2021   Abnormal vaginal bleeding in postmenopausal patient 08/17/2021   Paroxysmal SVT (supraventricular tachycardia) (HCC) 09/17/2019   Upper airway cough syndrome vs atypical asthma/ cough variant  09/17/2019   Anxiety 09/15/2019   Essential hypertension, benign 05/07/2019   Chronic diastolic (congestive) heart failure (HCC)    HLD (hyperlipidemia)    Morbid (severe) obesity due to excess calories (HCC)    Osteoarthritis of knee,  unspecified    Palpitations    Somnolence    Type 2 diabetes mellitus with hyperglycemia (HCC)    Raynaud's syndrome    DOE (dyspnea on exertion)    Precordial chest pain 09/08/2014    Past Surgical History:  Procedure Laterality Date   CARDIAC CATHETERIZATION N/A 12/14/2014   Procedure: Right/Left Heart Cath and Coronary Angiography;  Surgeon: Tonny Bollman, MD;  Location: [facility];  Service: Cardiovascular;  Laterality: N/A;   CARDIAC ELECTROPHYSIOLOGY STUDY AND ABLATION     around 2006, Patient states that heart rate was up to 180 bpm.   CESAREAN SECTION     x2 1990, 2005   CYSTOSCOPY  08/17/2021    Procedure: CYSTOSCOPY;  Surgeon: Carrington Clamp, MD;  Location: [facility];  Service: Gynecology;;   ENDOMETRIAL ABLATION     HYSTEROSCOPY WITH D & C N/A 03/11/2021   Procedure: DILATATION AND CURETTAGE /HYSTEROSCOPY;  Surgeon: Marlow Baars, MD;  Location: [facility];  Service: Gynecology;  Laterality: N/A;   OPERATIVE ULTRASOUND N/A 03/11/2021   Procedure: OPERATIVE ULTRASOUND;  Surgeon: Marlow Baars, MD;  Location: [facility] OR;  Service: Gynecology;  Laterality: N/A;  ultrasound guidance needed   ROBOT ASSISTED LAPAROSCOPIC NEPHRECTOMY Right 12/25/2022   Procedure: XI ROBOTIC ASSISTED LAPAROSCOPIC NEPHRECTOMY;  Surgeon: Malen Gauze, MD;  Location: [facility];  Service: Urology;  Laterality: Right;   ROBOTIC ASSISTED LAPAROSCOPIC HYSTERECTOMY AND SALPINGECTOMY Bilateral 08/17/2021   Procedure: XI ROBOTIC ASSISTED LAPAROSCOPIC HYSTERECTOMY AND  BILATERAL SALPINGECTOMY AND OOPHERETOMY;  Surgeon: Carrington Clamp, MD;  Location: [facility] [place];  Service: Gynecology;  Laterality: Bilateral;    OB History     Gravida  3   Para  2   Term  2   Preterm      AB  1   Living  2      SAB  1   IAB      Ectopic      Multiple      Live Births               Home Medications    Prior to Admission medications   Medication Sig Start Date End Date Taking? Authorizing Provider  mupirocin ointment (BACTROBAN) 2 % Apply 1 Application topically 2 (two) times daily. 05/22/23  Yes Particia Nearing, PA-C  albuterol (VENTOLIN HFA) 108 (90 Base) MCG/ACT inhaler Inhale 1-2 puffs into the lungs every 6 (six) hours as needed for wheezing or shortness of breath. 05/02/20   Avegno, Zachery Dakins, FNP  Azelastine-Fluticasone 137-50 MCG/ACT SUSP Place 1 spray into the nose every 12 (twelve) hours. Patient not taking: Reported on 01/26/2023 07/28/21   Freddy Finner, NP  brexpiprazole (REXULTI) 2 MG TABS tablet Take 2 mg by mouth daily.    [provider]  buPROPion  (WELLBUTRIN XL) 150 MG 24 hr tablet Take 150 mg by mouth every morning.    [provider]  busPIRone (BUSPAR) 10 MG tablet Take 10 mg by mouth 2 (two) times daily. 10/19/20   [provider]  cetirizine (ZYRTEC) 10 MG tablet Take 10 mg by mouth daily as needed for allergies. Patient not taking: Reported on 01/01/2023    [provider]  clotrimazole-betamethasone (LOTRISONE) cream Apply 1 Application topically 2 (two) times daily. 01/26/23   McKenzie, Mardene Celeste, MD  estradiol (ESTRACE) 0.5 MG tablet Take 0.5 mg by mouth daily. 08/26/22   [provider]  ezetimibe (ZETIA) 10 MG tablet Take 1 tablet (  10 mg total) by mouth daily. 10/14/21 12/13/22  Christell Constant, MD  furosemide (LASIX) 20 MG tablet Take 1 tablet (20 mg total) by mouth daily. Patient taking differently: Take 20 mg by mouth daily as needed for fluid or edema. 09/19/21   Chandrasekhar, Rondel Jumbo, MD  gabapentin (NEURONTIN) 300 MG capsule Take 1 capsule (300 mg total) by mouth 3 (three) times daily. Start at bedtime and slowly increase to 3x a day as needed to make sure no side effects like sedation. Patient taking differently: Take 300 mg by mouth at bedtime. Start at bedtime and slowly increase to 3x a day as needed to make sure no side effects like sedation. 03/07/22   Anson Fret, MD  hydrOXYzine (ATARAX) 50 MG tablet Take 100 mg by mouth at bedtime.    [provider]  ibuprofen (ADVIL) 600 MG tablet Take 1 tablet (600 mg total) by mouth every 8 (eight) hours as needed. 01/18/22   Margaretann Loveless, PA-C  indapamide (LOZOL) 2.5 MG tablet Take 2.5 mg by mouth daily as needed (fluid). 12/07/20   [provider]  isosorbide mononitrate (IMDUR) 30 MG 24 hr tablet Take 1 tablet (30 mg total) by mouth daily. Patient not taking: Reported on 01/01/2023 11/08/22 02/06/23  Mallipeddi, Vishnu P, MD  lamoTRIgine (LAMICTAL) 200 MG tablet Take 200 mg by mouth daily.    [provider]  metFORMIN (GLUCOPHAGE) 500 MG tablet Take 1,000 mg by mouth 2 (two) times daily with a meal.    [provider]  metoprolol tartrate (LOPRESSOR) 50 MG tablet Take 2 tablets (100 mg total) by mouth 2 (two) times daily. 09/19/21   Christell Constant, MD  mupirocin ointment (BACTROBAN) 2 % Apply 1 Application topically 2 (two) times daily. 02/02/22   Vivi Barrack, DPM  nitroGLYCERIN (NITROSTAT) 0.4 MG SL tablet DISSOLVE ONE TABLET UNDER THE TONGUE EVERY 5 MINUTES AS NEEDED FOR CHEST PAIN.  DO NOT EXCEED A TOTAL OF 3 DOSES IN 15 MINUTES 09/26/22   Chandrasekhar, Mahesh A, MD  oxyCODONE-acetaminophen (PERCOCET) 5-325 MG tablet Take 1 tablet by mouth every 4 (four) hours as needed for severe pain (pain score 7-10). 03/13/23 03/12/24  Malen Gauze, MD  prochlorperazine (COMPAZINE) 10 MG tablet Take 1 tablet (10 mg total) by mouth every 6 (six) hours as needed for nausea or vomiting. 01/30/23   [name], MD  rosuvastatin (CRESTOR) 40 MG tablet Take 1 tablet (40 mg total) by mouth daily. 05/01/22 04/26/23  Mallipeddi, Vishnu P, MD  Semaglutide, 2 MG/DOSE, (OZEMPIC, 2 MG/DOSE,) 8 MG/3ML SOPN Inject 2 mg into the skin every 7 (seven) days.    [provider]  senna (SENOKOT) 8.6 MG TABS tablet Take 1 tablet (8.6 mg total) by mouth daily. 12/29/22   McKenzie, Mardene Celeste, MD  sertraline (ZOLOFT) 100 MG tablet Take 200 mg by mouth daily. 09/28/20   [provider]  sulfamethoxazole-trimethoprim (BACTRIM DS) 800-160 MG tablet Take 1 tablet by mouth 2 (two) times daily. 01/11/23   Bjorn Pippin, MD    Family History Family History  Problem Relation Age of Onset   Cancer Mother    Cancer Father    Parkinson's disease Father    Stroke Brother    Hypertension Brother    Cancer - Colon Brother     Social History Social History   Tobacco Use   Smoking status: Never    Passive exposure: Past   Smokeless tobacco: Never  Vaping Use  Vaping status: Never Used   Substance Use Topics   Alcohol use: Yes    Comment: seldom   Drug use: No     Allergies   Patient has no known allergies.   Review of Systems Review of Systems Per HPI  Physical Exam Triage Vital Signs ED Triage Vitals [05/22/23 1223]  Encounter Vitals Group     BP (!) 152/88     Systolic BP Percentile      Diastolic BP Percentile      Pulse Rate 84     Resp 18     Temp 98.2 F (36.8 C)     Temp Source Oral     SpO2 97 %     Weight      Height      Head Circumference      Peak Flow      Pain Score 4     Pain Loc      Pain Education      Exclude from Growth Chart    No data found.  Updated Vital Signs BP (!) 152/88 (BP Location: Right Arm)   Pulse 84   Temp 98.2 F (36.8 C) (Oral)   Resp 18   SpO2 97%   Visual Acuity Right Eye Distance:   Left Eye Distance:   Bilateral Distance:    Right Eye Near:   Left Eye Near:    Bilateral Near:     Physical Exam Vitals and nursing note reviewed.  Constitutional:      Appearance: Normal appearance. She is not ill-appearing.  HENT:     Head: Atraumatic.  Eyes:     Extraocular Movements: Extraocular movements intact.     Conjunctiva/sclera: Conjunctivae normal.  Cardiovascular:     Rate and Rhythm: Normal rate and regular rhythm.     Heart sounds: Normal heart sounds.  Pulmonary:     Effort: Pulmonary effort is normal.     Breath sounds: Normal breath sounds.  Musculoskeletal:        General: Normal range of motion.     Cervical back: Normal range of motion and neck supple.  Skin:    General: Skin is warm and dry.     Comments: Embedded tick to the right side of neck with minimal surrounding erythema  Neurological:     Mental Status: She is alert and oriented to person, place, and time.  Psychiatric:        Mood and Affect: Mood normal.        Thought Content: Thought content normal.        Judgment: Judgment normal.      UC Treatments / Results  Labs (all labs ordered are listed, but only  abnormal results are displayed) Labs Reviewed - No data to display  EKG   Radiology No results found.  Procedures Procedures (including critical care time)  Medications Ordered in UC Medications - No data to display  Initial Impression / Assessment and Plan / UC Course  I have reviewed the triage vital signs and the nursing notes.  Pertinent labs & imaging results that were available during my care of the patient were reviewed by me and considered in my medical decision making (see chart for details).     Tick removed with forceps with no complication.  Patient tolerated well.  Clean the area at least twice a day and apply mupirocin ointment and a bandage.  Return for worsening symptoms.  Final Clinical Impressions(s) / UC Diagnoses  Final diagnoses:  Skin lesion  Tick bite of other part of neck, initial encounter   Discharge Instructions   None    ED Prescriptions     Medication Sig Dispense Auth. Provider   mupirocin ointment (BACTROBAN) 2 % Apply 1 Application topically 2 (two) times daily. 22 g Particia Nearing, [place]      PDMP not reviewed this encounter.   Particia Nearing, [place] 05/22/23 1304

## 2023-05-29 ENCOUNTER — Inpatient Hospital Stay (HOSPITAL_BASED_OUTPATIENT_CLINIC_OR_DEPARTMENT_OTHER): Payer: 59 | Admitting: Internal Medicine

## 2023-05-29 ENCOUNTER — Other Ambulatory Visit: Payer: Self-pay

## 2023-05-29 ENCOUNTER — Inpatient Hospital Stay: Payer: 59

## 2023-05-29 VITALS — BP 150/72 | HR 67 | Temp 98.2°F | Resp 17 | Ht 64.0 in | Wt 266.0 lb

## 2023-05-29 VITALS — BP 126/74 | HR 64 | Resp 18

## 2023-05-29 DIAGNOSIS — C641 Malignant neoplasm of right kidney, except renal pelvis: Secondary | ICD-10-CM

## 2023-05-29 DIAGNOSIS — Z905 Acquired absence of kidney: Secondary | ICD-10-CM | POA: Diagnosis not present

## 2023-05-29 DIAGNOSIS — K469 Unspecified abdominal hernia without obstruction or gangrene: Secondary | ICD-10-CM | POA: Diagnosis not present

## 2023-05-29 DIAGNOSIS — Z5112 Encounter for antineoplastic immunotherapy: Secondary | ICD-10-CM | POA: Diagnosis not present

## 2023-05-29 DIAGNOSIS — Z79899 Other long term (current) drug therapy: Secondary | ICD-10-CM | POA: Diagnosis not present

## 2023-05-29 DIAGNOSIS — E1165 Type 2 diabetes mellitus with hyperglycemia: Secondary | ICD-10-CM | POA: Diagnosis not present

## 2023-05-29 LAB — CMP (CANCER CENTER ONLY)
ALT: 16 U/L (ref 0–44)
AST: 17 U/L (ref 15–41)
Albumin: 4.3 g/dL (ref 3.5–5.0)
Alkaline Phosphatase: 51 U/L (ref 38–126)
Anion gap: 6 (ref 5–15)
BUN: 20 mg/dL (ref 6–20)
CO2: 28 mmol/L (ref 22–32)
Calcium: 9.4 mg/dL (ref 8.9–10.3)
Chloride: 101 mmol/L (ref 98–111)
Creatinine: 0.91 mg/dL (ref 0.44–1.00)
GFR, Estimated: >60 mL/min (ref >60)
Glucose, Bld: 191 mg/dL — ABNORMAL HIGH (ref 70–99)
Potassium: 4.6 mmol/L (ref 3.5–5.1)
Sodium: 135 mmol/L (ref 135–145)
Total Bilirubin: 0.5 mg/dL (ref 0.0–1.2)
Total Protein: 7.5 g/dL (ref 6.5–8.1)

## 2023-05-29 LAB — CBC WITH DIFFERENTIAL (CANCER CENTER ONLY)
Abs Immature Granulocytes: 0.04 10*3/uL (ref 0.00–0.07)
Basophils Absolute: 0.1 10*3/uL (ref 0.0–0.1)
Basophils Relative: 1 %
Eosinophils Absolute: 0.3 10*3/uL (ref 0.0–0.5)
Eosinophils Relative: 4 %
HCT: 41.3 % (ref 36.0–46.0)
Hemoglobin: 13.9 g/dL (ref 12.0–15.0)
Immature Granulocytes: 1 %
Lymphocytes Relative: 30 %
Lymphs Abs: 2.4 10*3/uL (ref 0.7–4.0)
MCH: 28.8 pg (ref 26.0–34.0)
MCHC: 33.7 g/dL (ref 30.0–36.0)
MCV: 85.7 fL (ref 80.0–100.0)
Monocytes Absolute: 0.7 10*3/uL (ref 0.1–1.0)
Monocytes Relative: 8 %
Neutro Abs: 4.6 10*3/uL (ref 1.7–7.7)
Neutrophils Relative %: 56 %
Platelet Count: 226 10*3/uL (ref 150–400)
RBC: 4.82 MIL/uL (ref 3.87–5.11)
RDW: 14 % (ref 11.5–15.5)
WBC Count: 8.1 10*3/uL (ref 4.0–10.5)
nRBC: 0 % (ref 0.0–0.2)

## 2023-05-29 MED ORDER — SODIUM CHLORIDE 0.9 % IV SOLN
200.0000 mg | Freq: Once | INTRAVENOUS | Status: AC
  Administered 2023-05-29: 200 mg via INTRAVENOUS
  Filled 2023-05-29: qty 200

## 2023-05-29 MED ORDER — SODIUM CHLORIDE 0.9 % IV SOLN: Freq: Once | INTRAVENOUS | Status: AC

## 2023-05-29 NOTE — Patient Instructions (Signed)

## 2023-05-29 NOTE — Progress Notes (Signed)
 [facility] [phone]   [phone]  OFFICE PROGRESS NOTE  Practice, [facility] [address] [place] [address]  DIAGNOSIS:  Stage III (T3a, N0, M0) clear-cell renal cell carcinoma of the right kidney diagnosed in July 2024  PRIOR THERAPY:  status post robotic assisted right radical nephrectomy and the final pathology was consistent with clear-cell renal cell carcinoma with focal rhabdoid features, nuclear grade 4 with tumor size of 10.0 cm and tumor extends into the renal sinus fat and renal vein (pT3a).  The ureteral, vascular and all margins of resection were negative for tumor.   CURRENT THERAPY: Adjuvant treatment with immunotherapy with Keytruda  200 Mg IV every 3 weeks.  First dose was given on 02/13/2023.  Status post 4 cycles.  INTERVAL HISTORY: Christina Cameron 57 y.o. female returns to the clinic today for follow-up visit.Discussed the use of AI scribe software for clinical note transcription with the patient, who gave verbal consent to proceed.  History of Present Illness   The patient, Christina Cameron, a 57 year old with a history of stage III Clear cell renal cell carcinoma, underwent a radical nephrectomy in July 2024. Following the surgery, she was started on Adjuvant Pembrolizumab . She has completed four cycles of immunotherapy, administered every three weeks, and is currently due for her fifth cycle.  Recently, she experienced a tick bite on the back of her neck, which was removed approximately a week prior to the current consultation. She did not experience any associated fever or chills.  The patient reports feeling very tired and lethargic, with a sensation of weakness in her legs. She also expresses discomfort due to an abdominal hernia, which has been causing her significant trouble.  The patient has also been dealing with personal loss, having recently lost her husband and a child three years prior. Despite these challenges, she is  trying to stay positive, particularly for her other child who is currently attending university.         MEDICAL HISTORY: Past Medical History:  Diagnosis Date   Arm vein blood clot, unspecified laterality    around 20 years ago as of 08/10/21, Patient doesn't remember what caused it. She was not put on blood thinners.   Arthritis    right knee   Asthma    Cancer (HCC)    cervical 1989   Cancer of kidney (HCC)    Chest pain    a. normal cors by cath in 11/2014 / 03/02/2020 nuclear stress test demonstrated no perfusion defects consistent with prior infart or current ischemia. Normal study.   Chronic diastolic (congestive) heart failure (HCC)    09/27/20 Echocardiogram in Epic showed LVEF 60 to 65%.   Complication of anesthesia    post-operative nausea & vomiting   COVID-19    05/29/19 & 05/2020 Covid infections   Depression    Diabetes mellitus, type 2 (HCC)    Dysrhythmia    heart palpitations   GERD (gastroesophageal reflux disease)    H/O cardiovascular stress test 03/02/2020   03/02/2020 Nuclear stress test in Epic showed no perusion defects consistent with prior infarct or ischemia - normal study.   Headache    migraines   High cholesterol    pt takes Crestor    History of cardiac radiofrequency ablation    Hypertension    Last cardiology office visit, 10/22/20 with Prentice Medley, NP ( see note in Epic) as of 08/09/21.   Morbid (severe) obesity due to excess  calories (HCC)    Neuromuscular disorder (HCC)    severe neuropathy in feet   Osteoarthritis of knee, unspecified    Palpitations    PONV (postoperative nausea and vomiting)    Post-COVID syndrome 05/2019   Pt experienced chest tightness, sob, palpitations & dizziness after Covid infection. See 04/2020 note from Prentice Medley, NP in [place].   PTSD (post-traumatic stress disorder)    Raynaud's syndrome    Sleep apnea 09/24/2020   sleep study indicated moderate sleep apea   SVT (supraventricular tachycardia) (HCC)    a.  s/p ablation by Dr. Waddell in 2006.   Wears glasses    prescripton reading glasses    ALLERGIES:  has no known allergies.  MEDICATIONS:  Current Outpatient Medications  Medication Sig Dispense Refill   albuterol  (VENTOLIN  HFA) 108 (90 Base) MCG/ACT inhaler Inhale 1-2 puffs into the lungs every 6 (six) hours as needed for wheezing or shortness of breath. 18 g 0   Azelastine -Fluticasone  137-50 MCG/ACT SUSP Place 1 spray into the nose every 12 (twelve) hours. (Patient not taking: Reported on 01/26/2023) 23 g 0   brexpiprazole  (REXULTI ) 2 MG TABS tablet Take 2 mg by mouth daily.     buPROPion  (WELLBUTRIN  XL) 150 MG 24 hr tablet Take 150 mg by mouth every morning.     busPIRone  (BUSPAR ) 10 MG tablet Take 10 mg by mouth 2 (two) times daily.     cetirizine  (ZYRTEC ) 10 MG tablet Take 10 mg by mouth daily as needed for allergies. (Patient not taking: Reported on 01/01/2023)     clotrimazole -betamethasone  (LOTRISONE ) cream Apply 1 Application topically 2 (two) times daily. 30 g 3   estradiol  (ESTRACE ) 0.5 MG tablet Take 0.5 mg by mouth daily.     ezetimibe  (ZETIA ) 10 MG tablet Take 1 tablet (10 mg total) by mouth daily. 90 tablet 3   furosemide  (LASIX ) 20 MG tablet Take 1 tablet (20 mg total) by mouth daily. (Patient taking differently: Take 20 mg by mouth daily as needed for fluid or edema.) 90 tablet 3   gabapentin  (NEURONTIN ) 300 MG capsule Take 1 capsule (300 mg total) by mouth 3 (three) times daily. Start at bedtime and slowly increase to 3x a day as needed to make sure no side effects like sedation. (Patient taking differently: Take 300 mg by mouth at bedtime. Start at bedtime and slowly increase to 3x a day as needed to make sure no side effects like sedation.) 90 capsule 11   hydrOXYzine  (ATARAX ) 50 MG tablet Take 100 mg by mouth at bedtime.     ibuprofen  (ADVIL ) 600 MG tablet Take 1 tablet (600 mg total) by mouth every 8 (eight) hours as needed. 30 tablet 0   indapamide  (LOZOL ) 2.5 MG tablet Take  2.5 mg by mouth daily as needed (fluid).     isosorbide  mononitrate (IMDUR ) 30 MG 24 hr tablet Take 1 tablet (30 mg total) by mouth daily. (Patient not taking: Reported on 01/01/2023) 90 tablet 3   lamoTRIgine  (LAMICTAL ) 200 MG tablet Take 200 mg by mouth daily.     metFORMIN  (GLUCOPHAGE ) 500 MG tablet Take 1,000 mg by mouth 2 (two) times daily with a meal.     metoprolol  tartrate (LOPRESSOR ) 50 MG tablet Take 2 tablets (100 mg total) by mouth 2 (two) times daily. 180 tablet 3   mupirocin  ointment (BACTROBAN ) 2 % Apply 1 Application topically 2 (two) times daily. 30 g 2   mupirocin  ointment (BACTROBAN ) 2 % Apply 1 Application topically 2 (  two) times daily. 22 g 0   nitroGLYCERIN  (NITROSTAT ) 0.4 MG SL tablet DISSOLVE ONE TABLET UNDER THE TONGUE EVERY 5 MINUTES AS NEEDED FOR CHEST PAIN.  DO NOT EXCEED A TOTAL OF 3 DOSES IN 15 MINUTES 75 tablet 0   oxyCODONE -acetaminophen  (PERCOCET) 5-325 MG tablet Take 1 tablet by mouth every 4 (four) hours as needed for severe pain (pain score 7-10). 30 tablet 0   prochlorperazine  (COMPAZINE ) 10 MG tablet Take 1 tablet (10 mg total) by mouth every 6 (six) hours as needed for nausea or vomiting. 30 tablet 1   rosuvastatin  (CRESTOR ) 40 MG tablet Take 1 tablet (40 mg total) by mouth daily. 90 tablet 3   Semaglutide, 2 MG/DOSE, (OZEMPIC, 2 MG/DOSE,) 8 MG/3ML SOPN Inject 2 mg into the skin every 7 (seven) days.     senna (SENOKOT) 8.6 MG TABS tablet Take 1 tablet (8.6 mg total) by mouth daily. 120 tablet 0   sertraline  (ZOLOFT ) 100 MG tablet Take 200 mg by mouth daily.     sulfamethoxazole -trimethoprim  (BACTRIM  DS) 800-160 MG tablet Take 1 tablet by mouth 2 (two) times daily. 14 tablet 0   No current facility-administered medications for this visit.    SURGICAL HISTORY:  Past Surgical History:  Procedure Laterality Date   CARDIAC CATHETERIZATION N/A 12/14/2014   Procedure: Right/Left Heart Cath and Coronary Angiography;  Surgeon: Ozell Fell, MD;  Location: [facility]  INVASIVE CV LAB;  Service: Cardiovascular;  Laterality: N/A;   CARDIAC ELECTROPHYSIOLOGY STUDY AND ABLATION     around 2006, Patient states that heart rate was up to 180 bpm.   CESAREAN SECTION     x2 1990, 2005   CYSTOSCOPY  08/17/2021   Procedure: CYSTOSCOPY;  Surgeon: Sarrah Browning, MD;  Location: [facility];  Service: Gynecology;;   ENDOMETRIAL ABLATION     HYSTEROSCOPY WITH D & C N/A 03/11/2021   Procedure: DILATATION AND CURETTAGE /HYSTEROSCOPY;  Surgeon: Gretta Gums, MD;  Location: [facility] OR;  Service: Gynecology;  Laterality: N/A;   OPERATIVE ULTRASOUND N/A 03/11/2021   Procedure: OPERATIVE ULTRASOUND;  Surgeon: Gretta Gums, MD;  Location: [facility] OR;  Service: Gynecology;  Laterality: N/A;  ultrasound guidance needed   ROBOT ASSISTED LAPAROSCOPIC NEPHRECTOMY Right 12/25/2022   Procedure: XI ROBOTIC ASSISTED LAPAROSCOPIC NEPHRECTOMY;  Surgeon: Sherrilee Belvie CROME, MD;  Location: [facility];  Service: Urology;  Laterality: Right;   ROBOTIC ASSISTED LAPAROSCOPIC HYSTERECTOMY AND SALPINGECTOMY Bilateral 08/17/2021   Procedure: XI ROBOTIC ASSISTED LAPAROSCOPIC HYSTERECTOMY AND  BILATERAL SALPINGECTOMY AND OOPHERETOMY;  Surgeon: Sarrah Browning, MD;  Location: [facility];  Service: Gynecology;  Laterality: Bilateral;    REVIEW OF SYSTEMS:  A comprehensive review of systems was negative except for: Constitutional: positive for fatigue Musculoskeletal: positive for arthralgias   PHYSICAL EXAMINATION: General appearance: alert, cooperative, fatigued, and no distress Head: Normocephalic, without obvious abnormality, atraumatic Neck: no adenopathy, no JVD, supple, symmetrical, trachea midline, and thyroid  not enlarged, symmetric, no tenderness/mass/nodules Lymph nodes: Cervical, supraclavicular, and axillary nodes normal. Resp: clear to auscultation bilaterally Back: symmetric, no curvature. ROM normal. No CVA tenderness. Cardio: regular rate and rhythm, S1, S2  normal, no murmur, click, rub or gallop GI: soft, non-tender; bowel sounds normal; no masses,  no organomegaly Extremities: extremities normal, atraumatic, no cyanosis or edema  ECOG PERFORMANCE STATUS: 1 - Symptomatic but completely ambulatory  Blood pressure (!) 150/72, pulse 67, temperature 98.2 F (36.8 C), temperature source Temporal, resp. rate 17, height 5' 4 (1.626 m), weight 266 lb (120.7 kg), SpO2  100%.  LABORATORY DATA: Lab Results  Component Value Date   WBC 8.1 05/29/2023   HGB 13.9 05/29/2023   HCT 41.3 05/29/2023   MCV 85.7 05/29/2023   PLT 226 05/29/2023      Chemistry      Component Value Date/Time   NA 138 05/04/2023 0802   NA 142 11/08/2022 1530   NA 140 10/19/2011 0919   K 3.7 05/04/2023 0802   K 4.1 10/19/2011 0919   CL 106 05/04/2023 0802   CL 104 10/19/2011 0919   CO2 23 05/04/2023 0802   CO2 24 10/19/2011 0919   BUN 15 05/04/2023 0802   BUN 16 11/08/2022 1530   BUN 16 10/19/2011 0919   CREATININE 0.90 05/04/2023 0802   CREATININE 0.63 10/19/2011 0919   GLU 297 04/15/2019 0000      Component Value Date/Time   CALCIUM  8.6 (L) 05/04/2023 0802   CALCIUM  8.5 10/19/2011 0919   ALKPHOS 52 05/04/2023 0802   ALKPHOS 57 10/19/2011 0919   AST 22 05/04/2023 0802   ALT 20 05/04/2023 0802   ALT 24 10/19/2011 0919   BILITOT 0.5 05/04/2023 0802       RADIOGRAPHIC STUDIES: No results found.  ASSESSMENT AND PLAN: This is a very pleasant 57 years old white female recently diagnosed with a stage III (T3a, N0, M0) clear-cell renal cell carcinoma of the right kidney diagnosed in July 2024 status post robotic assisted right radical nephrectomy and the final pathology was consistent with clear-cell renal cell carcinoma with focal rhabdoid features, nuclear grade 4 with tumor size of 10.0 cm and tumor extends into the renal sinus fat and renal vein (pT3a).  The ureteral, vascular and all margins of resection were negative for tumor.  She is currently undergoing  adjuvant treatment with immunotherapy with Keytruda  200 Mg IV every 3 weeks.  She started the first cycle of her treatment on 02/13/2023.  Status post 4 cycles She has been tolerating this treatment fairly well.    Metastatic Clear Cell Renal cell carcinoma Metastatic Clear Cell Renal cell carcinoma, status post right radical nephrectomy in July 2024. Currently undergoing adjuvant immunotherapy with pembrolizumab  (Keytruda ). Completed four cycles, with the fifth cycle scheduled today. No new symptoms or complications related to carcinoma or treatment. Immune therapy should not significantly impact surgical healing, unlike chemotherapy. - Administer cycle 5 of pembrolizumab  (Keytruda ) today - Plan for a scan around cycle 8 or 9 if not done by the urologist  Abdominal Hernia Significant discomfort and leg weakness attributed to an abdominal hernia. Surgical intervention delayed due to ongoing immunotherapy and need for internal healing. Follow-up appointment with the surgical center on June 14, 2023. Immune therapy should not be a major concern for surgery, but healing is a consideration. - Follow up with the surgical center on June 14, 2023 - Discuss the possibility of surgical intervention considering the current treatment regimen  Diabetes Mellitus Chronic condition with consistently high blood glucose levels noted in recent lab work. No specific management changes discussed during this visit. - Continue current diabetes management - Monitor blood glucose levels regularly  Tick Bite Recent tick bite on the back of the neck, removed approximately one week ago. No associated fever, chills, or other systemic symptoms reported. - Monitor for signs of infection or systemic symptoms  General Health Maintenance General health maintenance discussed briefly. No specific screenings or vaccinations mentioned. - Encourage maintaining general health and wellness  Follow-up - Schedule next  oncology follow-up in three weeks.  She was advised to call immediately if she has any concerning symptoms in the interval. The patient voices understanding of current disease status and treatment options and is in agreement with the current care plan.  All questions were answered. The patient knows to call the clinic with any problems, questions or concerns. We can certainly see the patient much sooner if necessary. The total time spent in the appointment was 20 minutes.  Disclaimer: This note was dictated with voice recognition software. Similar sounding words can inadvertently be transcribed and may not be corrected upon review.

## 2023-06-08 ENCOUNTER — Other Ambulatory Visit: Payer: 59

## 2023-06-08 DIAGNOSIS — C641 Malignant neoplasm of right kidney, except renal pelvis: Secondary | ICD-10-CM

## 2023-06-09 ENCOUNTER — Other Ambulatory Visit: Payer: Self-pay

## 2023-06-09 ENCOUNTER — Encounter: Payer: Self-pay | Admitting: Internal Medicine

## 2023-06-09 LAB — COMPREHENSIVE METABOLIC PANEL
ALT: 19 [IU]/L (ref 0–32)
AST: 22 [IU]/L (ref 0–40)
Albumin: 4.2 g/dL (ref 3.8–4.9)
Alkaline Phosphatase: 60 [IU]/L (ref 44–121)
BUN/Creatinine Ratio: 21 (ref 9–23)
BUN: 21 mg/dL (ref 6–24)
Bilirubin Total: 0.3 mg/dL (ref 0.0–1.2)
CO2: 23 mmol/L (ref 20–29)
Calcium: 8.2 mg/dL — ABNORMAL LOW (ref 8.7–10.2)
Chloride: 99 mmol/L (ref 96–106)
Creatinine, Ser: 1.01 mg/dL — ABNORMAL HIGH (ref 0.57–1.00)
Globulin, Total: 2.8 g/dL (ref 1.5–4.5)
Glucose: 209 mg/dL — ABNORMAL HIGH (ref 70–99)
Potassium: 5.3 mmol/L — ABNORMAL HIGH (ref 3.5–5.2)
Sodium: 137 mmol/L (ref 134–144)
Total Protein: 7 g/dL (ref 6.0–8.5)
eGFR: 65 mL/min/{1.73_m2} (ref >59)

## 2023-06-11 ENCOUNTER — Other Ambulatory Visit: Payer: 59

## 2023-06-11 ENCOUNTER — Ambulatory Visit: Payer: 59

## 2023-06-11 ENCOUNTER — Ambulatory Visit: Payer: 59 | Admitting: Physician Assistant

## 2023-06-13 ENCOUNTER — Encounter: Payer: Self-pay | Admitting: Urology

## 2023-06-13 ENCOUNTER — Ambulatory Visit (HOSPITAL_COMMUNITY)
Admission: RE | Admit: 2023-06-13 | Discharge: 2023-06-13 | Disposition: A | Discharge status: Home / Self Care | Payer: 59 | Source: Ambulatory Visit | Attending: Urology | Admitting: Urology

## 2023-06-13 ENCOUNTER — Ambulatory Visit (INDEPENDENT_AMBULATORY_CARE_PROVIDER_SITE_OTHER): Payer: 59 | Admitting: Urology

## 2023-06-13 VITALS — BP 114/72 | HR 79

## 2023-06-13 DIAGNOSIS — R202 Paresthesia of skin: Secondary | ICD-10-CM | POA: Diagnosis not present

## 2023-06-13 DIAGNOSIS — C649 Malignant neoplasm of unspecified kidney, except renal pelvis: Secondary | ICD-10-CM | POA: Diagnosis not present

## 2023-06-13 DIAGNOSIS — C641 Malignant neoplasm of right kidney, except renal pelvis: Secondary | ICD-10-CM | POA: Diagnosis not present

## 2023-06-13 DIAGNOSIS — G5601 Carpal tunnel syndrome, right upper limb: Secondary | ICD-10-CM | POA: Diagnosis not present

## 2023-06-13 DIAGNOSIS — Z905 Acquired absence of kidney: Secondary | ICD-10-CM | POA: Diagnosis not present

## 2023-06-13 NOTE — Patient Instructions (Signed)
 Kidney Cancer  Kidney cancer is a type of cancer in which an abnormal growth of cells (tumor) forms in one or both kidneys. The kidneys filter waste from your blood and make urine. Kidney cancer may spread to other parts of your body. This type of cancer may also be called renal cell carcinoma. What are the causes? The cause of this condition is not known. What increases the risk? You may be more likely to develop kidney cancer if: You are over age 60. You have certain conditions that are passed from parent to child (inherited). These include von Hippel-Lindau disease, tuberous sclerosis, and hereditary papillary renal carcinoma. You smoke or have been exposed to certain chemicals. You have advanced kidney disease, especially if you need to use a machine to clean your blood (dialysis). You are obese. You have high blood pressure (hypertension). You are female. What are the signs or symptoms? At first, kidney cancer may not cause any symptoms. As the cancer grows, symptoms may include: Blood in the urine. Pain in the upper back or abdomen, just below the rib cage. You may feel pain on one or both sides of your body. Tiredness (fatigue). Weight loss that cannot be explained. Fever. How is this diagnosed? This condition may be diagnosed based on: Your symptoms. Your medical history. A physical exam. You may also have tests, such as: Blood and urine tests. X-rays. Imaging tests, such as CT scans, MRIs, and PET scans. Angiogram. This is when dye is injected into your blood to show your blood vessels. Intravenous pyelogram. This is when dye is injected into your blood to show your kidneys and the other organs that help with making and storing urine. Biopsy. This is when a piece of tissue is removed from your kidney and looked at under a microscope. Your cancer will be assessed (staged), based on how severe it is and how much it has spread. How is this treated? Treatment depends on the type  and stage of the cancer. Treatment may include: Surgery to remove: Just the tumor (nephron-sparing surgery). The entire kidney (nephrectomy). The kidney, some of the healthy tissue around it, nearby lymph nodes, and sometimes the adrenal gland (radical nephrectomy). Medicines to: Kill cancer cells (chemotherapy). Help your body's disease-fighting system (immune system) fight cancer cells. This is known as immunotherapy. Radiation therapy. This uses high-energy rays to kill cancer cells. Targeted therapy to only attack genes and proteins that allow a tumor to grow. This type of therapy tries to limit damage to your healthy cells. Cryoablation. This uses gas or liquid to freeze cancer cells. It is sent through a needle. Radiofrequency ablation. This uses high-energy radio waves to destroy cancer cells. It is sent through a needle-like probe. Embolization. This is a procedure to block the artery that supplies blood to the tumor. Follow these instructions at home: Eating and drinking Some of your treatments may affect your appetite and your ability to chew and swallow. If you have problems eating, or if you do not have an appetite, meet with an expert in diet and nutrition (dietitian). If you have side effects that affect eating, it may help to: Eat smaller meals more often. Eat bland or soft foods. Avoid foods that are hot, spicy, or hard to swallow. Drink shakes or supplements that are high in nutrition and calories. Do not drink alcohol. Lifestyle Do not use any products that contain nicotine or tobacco. These products include cigarettes, chewing tobacco, and vaping devices, such as e-cigarettes. If you need  help quitting, ask your health care provider. Get enough sleep. Most adults need 6-8 hours of sleep each night. During treatment, you may need more sleep. General instructions Take over-the-counter and prescription medicines only as told by your health care provider. Consider joining a  support group. This can help you cope with the stress of having kidney cancer. Work with your health care provider to manage any side effects of treatment. Keep all follow-up visits. Your health care provider will want to make sure your treatment is working. Where to find more information American Cancer Society: cancer.org [employer] (NCI): cancer.gov Contact a health care provider if: You bruise or bleed easily. You lose weight without trying. You have new or worse fatigue or weakness. You have a fever. Your pain suddenly increases. You have more blood in your urine. Your skin or the white parts of your eyes turn yellow (jaundice). Get help right away if: You have chest pain. You are short of breath. You have irregular heartbeats (palpitations). These symptoms may be an emergency. Get help right away. Call 911. Do not wait to see if the symptoms will go away. Do not drive yourself to the hospital. This information is not intended to replace advice given to you by your health care provider. Make sure you discuss any questions you have with your health care provider. Document Revised: 10/25/2021 Document Reviewed: 10/25/2021 Elsevier Patient Education  2024 [employer].

## 2023-06-13 NOTE — Progress Notes (Signed)
06/13/2023 4:19 PM   Christina Cameron Dec 09, 1965 [id number]  Referring provider: [name], MD [address] [place],  [place] [address]  Followup T3 RCC   HPI: [name] is a 57yo here for followup for RCC. She is doing well after right radical nephrectomy. She is on adjuvent [place] for a year after nephrectomy. She is awaiting incisional hernia repair. CMP normal. CXR normal   PMH: Past Medical History:  Diagnosis Date   Arm vein blood clot, unspecified laterality    around 20 years ago as of 08/10/21, Patient doesn't remember what caused it. She was not put on blood thinners.   Arthritis    right knee   Asthma    Cancer (HCC)    cervical 1989   Cancer of kidney (HCC)    Chest pain    a. normal cors by cath in 11/2014 / 03/02/2020 nuclear stress test demonstrated no perfusion defects consistent with prior infart or current ischemia. Normal study.   Chronic diastolic (congestive) heart failure (HCC)    09/27/20 Echocardiogram in Epic showed LVEF 60 to 65%.   Complication of anesthesia    post-operative nausea & vomiting   COVID-19    05/29/19 & 05/2020 Covid infections   Depression    Diabetes mellitus, type 2 (HCC)    Dysrhythmia    heart palpitations   GERD (gastroesophageal reflux disease)    H/O cardiovascular stress test 03/02/2020   03/02/2020 Nuclear stress test in Epic showed no perusion defects consistent with prior infarct or ischemia - normal study.   Headache    migraines   High cholesterol    pt takes Crestor   History of cardiac radiofrequency ablation    Hypertension    Last cardiology office visit, 10/22/20 with Rennis Harding, NP ( see note in Epic) as of 08/09/21.   Morbid (severe) obesity due to excess calories (HCC)    Neuromuscular disorder (HCC)    severe neuropathy in feet   Osteoarthritis of knee, unspecified    Palpitations    PONV (postoperative nausea and vomiting)    Post-COVID syndrome 05/2019   Pt experienced chest tightness,  sob, palpitations & dizziness after Covid infection. See 04/2020 note from Rennis Harding, NP in [place].   PTSD (post-traumatic stress disorder)    Raynaud's syndrome    Sleep apnea 09/24/2020   sleep study indicated moderate sleep apea   SVT (supraventricular tachycardia) (HCC)    a. s/p ablation by Dr. Ladona Ridgel in 2006.   Wears glasses    prescripton reading glasses    Surgical History: Past Surgical History:  Procedure Laterality Date   CARDIAC CATHETERIZATION N/A 12/14/2014   Procedure: Right/Left Heart Cath and Coronary Angiography;  Surgeon: Tonny Bollman, MD;  Location: [facility] INVASIVE CV LAB;  Service: Cardiovascular;  Laterality: N/A;   CARDIAC ELECTROPHYSIOLOGY STUDY AND ABLATION     around 2006, Patient states that heart rate was up to 180 bpm.   CESAREAN SECTION     x2 1990, 2005   CYSTOSCOPY  08/17/2021   Procedure: CYSTOSCOPY;  Surgeon: Carrington Clamp, MD;  Location: [facility];  Service: Gynecology;;   ENDOMETRIAL ABLATION     HYSTEROSCOPY WITH D & C N/A 03/11/2021   Procedure: DILATATION AND CURETTAGE /HYSTEROSCOPY;  Surgeon: Marlow Baars, MD;  Location: [facility] OR;  Service: Gynecology;  Laterality: N/A;   OPERATIVE ULTRASOUND N/A 03/11/2021   Procedure: OPERATIVE ULTRASOUND;  Surgeon: Marlow Baars, MD;  Location: [facility] OR;  Service: Gynecology;  Laterality: N/A;  ultrasound guidance needed   ROBOT ASSISTED LAPAROSCOPIC NEPHRECTOMY Right 12/25/2022   Procedure: XI ROBOTIC ASSISTED LAPAROSCOPIC NEPHRECTOMY;  Surgeon: Malen Gauze, MD;  Location: [facility];  Service: Urology;  Laterality: Right;   ROBOTIC ASSISTED LAPAROSCOPIC HYSTERECTOMY AND SALPINGECTOMY Bilateral 08/17/2021   Procedure: XI ROBOTIC ASSISTED LAPAROSCOPIC HYSTERECTOMY AND  BILATERAL SALPINGECTOMY AND OOPHERETOMY;  Surgeon: Carrington Clamp, MD;  Location: [facility] ;  Service: Gynecology;  Laterality: Bilateral;    Home Medications:  Allergies as of 06/13/2023   No Known  Allergies      Medication List        Accurate as of June 13, 2023  4:19 PM. If you have any questions, ask your nurse or doctor.          albuterol 108 (90 Base) MCG/ACT inhaler Commonly known as: VENTOLIN HFA Inhale 1-2 puffs into the lungs every 6 (six) hours as needed for wheezing or shortness of breath.   Azelastine-Fluticasone 137-50 MCG/ACT Susp Place 1 spray into the nose every 12 (twelve) hours.   brexpiprazole 2 MG Tabs tablet Commonly known as: REXULTI Take 2 mg by mouth daily.   buPROPion 150 MG 24 hr tablet Commonly known as: WELLBUTRIN XL Take 150 mg by mouth every morning.   busPIRone 10 MG tablet Commonly known as: BUSPAR Take 10 mg by mouth 2 (two) times daily.   cetirizine 10 MG tablet Commonly known as: ZYRTEC Take 10 mg by mouth daily as needed for allergies.   clotrimazole-betamethasone cream Commonly known as: LOTRISONE Apply 1 Application topically 2 (two) times daily.   estradiol 0.5 MG tablet Commonly known as: ESTRACE Take 0.5 mg by mouth daily.   ezetimibe 10 MG tablet Commonly known as: ZETIA Take 1 tablet (10 mg total) by mouth daily.   furosemide 20 MG tablet Commonly known as: LASIX Take 1 tablet (20 mg total) by mouth daily. What changed:  when to take this reasons to take this   gabapentin 300 MG capsule Commonly known as: NEURONTIN Take 1 capsule (300 mg total) by mouth 3 (three) times daily. Start at bedtime and slowly increase to 3x a day as needed to make sure no side effects like sedation. What changed: when to take this   hydrOXYzine 50 MG tablet Commonly known as: ATARAX Take 100 mg by mouth at bedtime.   ibuprofen 600 MG tablet Commonly known as: ADVIL Take 1 tablet (600 mg total) by mouth every 8 (eight) hours as needed.   indapamide 2.5 MG tablet Commonly known as: LOZOL Take 2.5 mg by mouth daily as needed (fluid).   isosorbide mononitrate 30 MG 24 hr tablet Commonly known as: IMDUR Take 1 tablet  (30 mg total) by mouth daily.   lamoTRIgine 200 MG tablet Commonly known as: LAMICTAL Take 200 mg by mouth daily.   metFORMIN 500 MG tablet Commonly known as: GLUCOPHAGE Take 1,000 mg by mouth 2 (two) times daily with a meal.   metoprolol tartrate 50 MG tablet Commonly known as: LOPRESSOR Take 2 tablets (100 mg total) by mouth 2 (two) times daily.   mupirocin ointment 2 % Commonly known as: BACTROBAN Apply 1 Application topically 2 (two) times daily.   mupirocin ointment 2 % Commonly known as: BACTROBAN Apply 1 Application topically 2 (two) times daily.   nitroGLYCERIN 0.4 MG SL tablet Commonly known as: NITROSTAT DISSOLVE ONE TABLET UNDER THE TONGUE EVERY 5 MINUTES AS NEEDED FOR CHEST PAIN.  DO NOT EXCEED A TOTAL OF 3  DOSES IN 15 MINUTES   oxyCODONE-acetaminophen 5-325 MG tablet Commonly known as: Percocet Take 1 tablet by mouth every 4 (four) hours as needed for severe pain (pain score 7-10).   Ozempic (2 MG/DOSE) 8 MG/3ML Sopn Generic drug: Semaglutide (2 MG/DOSE) Inject 2 mg into the skin every 7 (seven) days.   prochlorperazine 10 MG tablet Commonly known as: COMPAZINE Take 1 tablet (10 mg total) by mouth every 6 (six) hours as needed for nausea or vomiting.   rosuvastatin 40 MG tablet Commonly known as: CRESTOR Take 1 tablet (40 mg total) by mouth daily.   senna 8.6 MG Tabs tablet Commonly known as: SENOKOT Take 1 tablet (8.6 mg total) by mouth daily.   sertraline 100 MG tablet Commonly known as: ZOLOFT Take 200 mg by mouth daily.   sulfamethoxazole-trimethoprim 800-160 MG tablet Commonly known as: BACTRIM DS Take 1 tablet by mouth 2 (two) times daily.        Allergies: No Known Allergies  Family History: Family History  Problem Relation Age of Onset   Cancer Mother    Cancer Father    Parkinson's disease Father    Stroke Brother    Hypertension Brother    Cancer - Colon Brother     Social History:  reports that she has never smoked. She  has been exposed to tobacco smoke. She has never used smokeless tobacco. She reports current alcohol use. She reports that she does not use drugs.  ROS: All other review of systems were reviewed and are negative except what is noted above in HPI  Physical Exam: BP 114/72   Pulse 79   Constitutional:  Alert and oriented, No acute distress. HEENT: [place] AT, moist mucus membranes.  Trachea midline, no masses. Cardiovascular: No clubbing, cyanosis, or edema. Respiratory: Normal respiratory effort, no increased work of breathing. GI: Abdomen is soft, nontender, nondistended, no abdominal masses GU: No CVA tenderness.  Lymph: No cervical or inguinal lymphadenopathy. Skin: No rashes, bruises or suspicious lesions. Neurologic: Grossly intact, no focal deficits, moving all 4 extremities. Psychiatric: Normal mood and affect.  Laboratory Data: Lab Results  Component Value Date   WBC 8.1 05/29/2023   HGB 13.9 05/29/2023   HCT 41.3 05/29/2023   MCV 85.7 05/29/2023   PLT 226 05/29/2023    Lab Results  Component Value Date   CREATININE 1.01 (H) 06/08/2023    No results found for: "PSA"  No results found for: "TESTOSTERONE"  Lab Results  Component Value Date   HGBA1C 8.0 (H) 12/15/2022    Urinalysis    Component Value Date/Time   COLORURINE YELLOW 07/07/2018 0812   APPEARANCEUR Clear 11/08/2022 1417   LABSPEC 1.020 07/07/2018 0812   PHURINE 5.5 07/07/2018 0812   GLUCOSEU Negative 11/08/2022 1417   HGBUR NEGATIVE 07/07/2018 0812   BILIRUBINUR Negative 11/08/2022 1417   KETONESUR negative 11/28/2019 1455   KETONESUR TRACE (A) 07/07/2018 0812   PROTEINUR Negative 11/08/2022 1417   PROTEINUR NEGATIVE 07/07/2018 0812   UROBILINOGEN 0.2 11/28/2019 1455   UROBILINOGEN 0.2 04/28/2014 0331   NITRITE Negative 11/08/2022 1417   NITRITE NEGATIVE 07/07/2018 0812   LEUKOCYTESUR Negative 11/08/2022 1417    Lab Results  Component Value Date   LABMICR See below: 11/08/2022   WBCUA 0-5  11/08/2022   LABEPIT >10 (A) 11/08/2022   BACTERIA Moderate (A) 11/08/2022    Pertinent Imaging: CXR: Images reviewed and discussed with the patient  No results found for this or any previous visit.  No results found  for this or any previous visit.  No results found for this or any previous visit.  No results found for this or any previous visit.  No results found for this or any previous visit.  No results found for this or any previous visit.  No results found for this or any previous visit.  No results found for this or any previous visit.   Assessment & Plan:    1. Renal cell carcinoma of right kidney (HCC) (Primary) -followup 3 months with CMP and CXR and CT abd - Urinalysis, Routine w reflex microscopic   No follow-ups on file.  Wilkie Aye, MD  [facility] [place]

## 2023-06-14 ENCOUNTER — Other Ambulatory Visit: Payer: Self-pay

## 2023-06-14 DIAGNOSIS — K432 Incisional hernia without obstruction or gangrene: Secondary | ICD-10-CM | POA: Diagnosis not present

## 2023-06-14 LAB — URINALYSIS, ROUTINE W REFLEX MICROSCOPIC
Bilirubin, UA: NEGATIVE
Glucose, UA: NEGATIVE
Ketones, UA: NEGATIVE
Leukocytes,UA: NEGATIVE
Nitrite, UA: NEGATIVE
Protein,UA: NEGATIVE
RBC, UA: NEGATIVE
Specific Gravity, UA: 1.03 (ref 1.005–1.030)
Urobilinogen, Ur: 0.2 mg/dL (ref 0.2–1.0)
pH, UA: 6 (ref 5.0–7.5)

## 2023-06-18 ENCOUNTER — Inpatient Hospital Stay: Payer: 59

## 2023-06-18 ENCOUNTER — Encounter: Payer: Self-pay | Admitting: Internal Medicine

## 2023-06-18 ENCOUNTER — Other Ambulatory Visit: Payer: Self-pay | Admitting: Physician Assistant

## 2023-06-18 ENCOUNTER — Inpatient Hospital Stay: Payer: 59 | Admitting: Internal Medicine

## 2023-06-18 DIAGNOSIS — C641 Malignant neoplasm of right kidney, except renal pelvis: Secondary | ICD-10-CM

## 2023-06-19 ENCOUNTER — Encounter: Payer: Self-pay | Admitting: Urology

## 2023-06-20 ENCOUNTER — Other Ambulatory Visit: Payer: 59

## 2023-06-20 DIAGNOSIS — Z13220 Encounter for screening for lipoid disorders: Secondary | ICD-10-CM | POA: Diagnosis not present

## 2023-06-20 DIAGNOSIS — Z1329 Encounter for screening for other suspected endocrine disorder: Secondary | ICD-10-CM | POA: Diagnosis not present

## 2023-06-20 DIAGNOSIS — I10 Essential (primary) hypertension: Secondary | ICD-10-CM | POA: Diagnosis not present

## 2023-06-20 DIAGNOSIS — E7849 Other hyperlipidemia: Secondary | ICD-10-CM | POA: Diagnosis not present

## 2023-06-20 DIAGNOSIS — E119 Type 2 diabetes mellitus without complications: Secondary | ICD-10-CM | POA: Diagnosis not present

## 2023-06-21 ENCOUNTER — Encounter: Payer: Self-pay | Admitting: Internal Medicine

## 2023-06-21 NOTE — Addendum Note (Signed)
Addended byGustavus Messing on: 06/21/2023 01:28 PM   Modules accepted: Orders

## 2023-06-22 ENCOUNTER — Encounter: Payer: Self-pay | Admitting: Internal Medicine

## 2023-06-25 ENCOUNTER — Inpatient Hospital Stay: Payer: 59 | Attending: Internal Medicine

## 2023-06-25 ENCOUNTER — Inpatient Hospital Stay (HOSPITAL_BASED_OUTPATIENT_CLINIC_OR_DEPARTMENT_OTHER): Payer: 59 | Admitting: Internal Medicine

## 2023-06-25 ENCOUNTER — Inpatient Hospital Stay: Payer: 59

## 2023-06-25 VITALS — BP 137/73 | HR 74 | Temp 97.8°F | Resp 16 | Ht 64.0 in | Wt 269.1 lb

## 2023-06-25 DIAGNOSIS — Z79899 Other long term (current) drug therapy: Secondary | ICD-10-CM | POA: Insufficient documentation

## 2023-06-25 DIAGNOSIS — Z905 Acquired absence of kidney: Secondary | ICD-10-CM | POA: Insufficient documentation

## 2023-06-25 DIAGNOSIS — C641 Malignant neoplasm of right kidney, except renal pelvis: Secondary | ICD-10-CM

## 2023-06-25 DIAGNOSIS — Z5112 Encounter for antineoplastic immunotherapy: Secondary | ICD-10-CM | POA: Insufficient documentation

## 2023-06-25 LAB — CBC WITH DIFFERENTIAL (CANCER CENTER ONLY)
Abs Immature Granulocytes: 0.05 10*3/uL (ref 0.00–0.07)
Basophils Absolute: 0.1 10*3/uL (ref 0.0–0.1)
Basophils Relative: 1 %
Eosinophils Absolute: 0.3 10*3/uL (ref 0.0–0.5)
Eosinophils Relative: 4 %
HCT: 42.3 % (ref 36.0–46.0)
Hemoglobin: 14.2 g/dL (ref 12.0–15.0)
Immature Granulocytes: 1 %
Lymphocytes Relative: 24 %
Lymphs Abs: 1.8 10*3/uL (ref 0.7–4.0)
MCH: 29 pg (ref 26.0–34.0)
MCHC: 33.6 g/dL (ref 30.0–36.0)
MCV: 86.5 fL (ref 80.0–100.0)
Monocytes Absolute: 0.6 10*3/uL (ref 0.1–1.0)
Monocytes Relative: 8 %
Neutro Abs: 4.7 10*3/uL (ref 1.7–7.7)
Neutrophils Relative %: 62 %
Platelet Count: 218 10*3/uL (ref 150–400)
RBC: 4.89 MIL/uL (ref 3.87–5.11)
RDW: 13.6 % (ref 11.5–15.5)
WBC Count: 7.5 10*3/uL (ref 4.0–10.5)
nRBC: 0 % (ref 0.0–0.2)

## 2023-06-25 LAB — CMP (CANCER CENTER ONLY)
ALT: 18 U/L (ref 0–44)
AST: 17 U/L (ref 15–41)
Albumin: 4.3 g/dL (ref 3.5–5.0)
Alkaline Phosphatase: 50 U/L (ref 38–126)
Anion gap: 8 (ref 5–15)
BUN: 27 mg/dL — ABNORMAL HIGH (ref 6–20)
CO2: 24 mmol/L (ref 22–32)
Calcium: 9.4 mg/dL (ref 8.9–10.3)
Chloride: 103 mmol/L (ref 98–111)
Creatinine: 0.97 mg/dL (ref 0.44–1.00)
GFR, Estimated: >60 mL/min (ref >60)
Glucose, Bld: 274 mg/dL — ABNORMAL HIGH (ref 70–99)
Potassium: 4.5 mmol/L (ref 3.5–5.1)
Sodium: 135 mmol/L (ref 135–145)
Total Bilirubin: 0.5 mg/dL (ref 0.0–1.2)
Total Protein: 7.6 g/dL (ref 6.5–8.1)

## 2023-06-25 LAB — TSH: TSH: 1.549 u[IU]/mL (ref 0.350–4.500)

## 2023-06-25 MED ORDER — SODIUM CHLORIDE 0.9 % IV SOLN: Freq: Once | INTRAVENOUS | Status: AC

## 2023-06-25 MED ORDER — SODIUM CHLORIDE 0.9 % IV SOLN
200.0000 mg | Freq: Once | INTRAVENOUS | Status: AC
  Administered 2023-06-25: 200 mg via INTRAVENOUS
  Filled 2023-06-25: qty 200

## 2023-06-25 NOTE — Progress Notes (Signed)
[facility] [phone]   [phone]  OFFICE PROGRESS NOTE  Practice, [facility] [address] [place] [address]  DIAGNOSIS:  Stage III (T3a, N0, M0) clear-cell renal cell carcinoma of the right kidney diagnosed in July 2024  PRIOR THERAPY:  status post robotic assisted right radical nephrectomy and the final pathology was consistent with clear-cell renal cell carcinoma with focal rhabdoid features, nuclear grade 4 with tumor size of 10.0 cm and tumor extends into the renal sinus fat and renal vein (pT3a).  The ureteral, vascular and all margins of resection were negative for tumor.   CURRENT THERAPY: Adjuvant treatment with immunotherapy with Keytruda 200 Mg IV every 3 weeks.  First dose was given on 02/13/2023.  Status post 5 cycles.  INTERVAL HISTORY: Christina Cameron 58 y.o. female returns to the clinic today for follow-up visit.Discussed the use of AI scribe software for clinical note transcription with the patient, who gave verbal consent to proceed.  History of Present Illness   The patient, Christina Cameron, aged 64, has a history of right renal carcinoma for which she underwent a radical nephrectomy in July 2024. She is currently undergoing Adjuvant treatment with immunotherapy with Keytruda 200 Mg IV every 3 weeks.  First dose was given on 02/13/2023.  Status post 5 cycles. The patient has a large abdominal hernia on the right side, which is currently under evaluation for surgical repair. The size and location of the hernia have posed challenges for surgical intervention, with concerns about potential complications from mesh placement. The patient recalls discussions about potential interventions, including stomach stretching via balloon or weight loss surgery prior to hernia repair.  The patient reports persistent fatigue, but denies any new complaints.  The patient also mentions a pocket under her left armpit, which is not present on the right  side.         MEDICAL HISTORY: Past Medical History:  Diagnosis Date   Arm vein blood clot, unspecified laterality    around 20 years ago as of 08/10/21, Patient doesn't remember what caused it. She was not put on blood thinners.   Arthritis    right knee   Asthma    Cancer (HCC)    cervical 1989   Cancer of kidney (HCC)    Chest pain    a. normal cors by cath in 11/2014 / 03/02/2020 nuclear stress test demonstrated no perfusion defects consistent with prior infart or current ischemia. Normal study.   Chronic diastolic (congestive) heart failure (HCC)    09/27/20 Echocardiogram in Epic showed LVEF 60 to 65%.   Complication of anesthesia    post-operative nausea & vomiting   COVID-19    05/29/19 & 05/2020 Covid infections   Depression    Diabetes mellitus, type 2 (HCC)    Dysrhythmia    heart palpitations   GERD (gastroesophageal reflux disease)    H/O cardiovascular stress test 03/02/2020   03/02/2020 Nuclear stress test in Epic showed no perusion defects consistent with prior infarct or ischemia - normal study.   Headache    migraines   High cholesterol    pt takes Crestor   History of cardiac radiofrequency ablation    Hypertension    Last cardiology office visit, 10/22/20 with Rennis Harding, NP ( see note in Epic) as of 08/09/21.   Morbid (severe) obesity due to excess calories (HCC)    Neuromuscular disorder (HCC)    severe neuropathy in feet  Osteoarthritis of knee, unspecified    Palpitations    PONV (postoperative nausea and vomiting)    Post-COVID syndrome 05/2019   Pt experienced chest tightness, sob, palpitations & dizziness after Covid infection. See 04/2020 note from Rennis Harding, NP in [place].   PTSD (post-traumatic stress disorder)    Raynaud's syndrome    Sleep apnea 09/24/2020   sleep study indicated moderate sleep apea   SVT (supraventricular tachycardia) (HCC)    a. s/p ablation by Dr. Ladona Ridgel in 2006.   Wears glasses    prescripton reading glasses     ALLERGIES:  has no known allergies.  MEDICATIONS:  Current Outpatient Medications  Medication Sig Dispense Refill   albuterol (VENTOLIN HFA) 108 (90 Base) MCG/ACT inhaler Inhale 1-2 puffs into the lungs every 6 (six) hours as needed for wheezing or shortness of breath. 18 g 0   Azelastine-Fluticasone 137-50 MCG/ACT SUSP Place 1 spray into the nose every 12 (twelve) hours. (Patient not taking: Reported on 01/26/2023) 23 g 0   brexpiprazole (REXULTI) 2 MG TABS tablet Take 2 mg by mouth daily.     buPROPion (WELLBUTRIN XL) 150 MG 24 hr tablet Take 150 mg by mouth every morning.     busPIRone (BUSPAR) 10 MG tablet Take 10 mg by mouth 2 (two) times daily.     cetirizine (ZYRTEC) 10 MG tablet Take 10 mg by mouth daily as needed for allergies. (Patient not taking: Reported on 01/01/2023)     clotrimazole-betamethasone (LOTRISONE) cream Apply 1 Application topically 2 (two) times daily. 30 g 3   estradiol (ESTRACE) 0.5 MG tablet Take 0.5 mg by mouth daily.     ezetimibe (ZETIA) 10 MG tablet Take 1 tablet (10 mg total) by mouth daily. 90 tablet 3   furosemide (LASIX) 20 MG tablet Take 1 tablet (20 mg total) by mouth daily. (Patient taking differently: Take 20 mg by mouth daily as needed for fluid or edema.) 90 tablet 3   gabapentin (NEURONTIN) 300 MG capsule Take 1 capsule (300 mg total) by mouth 3 (three) times daily. Start at bedtime and slowly increase to 3x a day as needed to make sure no side effects like sedation. (Patient taking differently: Take 300 mg by mouth at bedtime. Start at bedtime and slowly increase to 3x a day as needed to make sure no side effects like sedation.) 90 capsule 11   hydrOXYzine (ATARAX) 50 MG tablet Take 100 mg by mouth at bedtime.     ibuprofen (ADVIL) 600 MG tablet Take 1 tablet (600 mg total) by mouth every 8 (eight) hours as needed. 30 tablet 0   indapamide (LOZOL) 2.5 MG tablet Take 2.5 mg by mouth daily as needed (fluid).     isosorbide mononitrate (IMDUR) 30 MG 24  hr tablet Take 1 tablet (30 mg total) by mouth daily. (Patient not taking: Reported on 01/01/2023) 90 tablet 3   lamoTRIgine (LAMICTAL) 200 MG tablet Take 200 mg by mouth daily.     metFORMIN (GLUCOPHAGE) 500 MG tablet Take 1,000 mg by mouth 2 (two) times daily with a meal.     metoprolol tartrate (LOPRESSOR) 50 MG tablet Take 2 tablets (100 mg total) by mouth 2 (two) times daily. 180 tablet 3   mupirocin ointment (BACTROBAN) 2 % Apply 1 Application topically 2 (two) times daily. 30 g 2   mupirocin ointment (BACTROBAN) 2 % Apply 1 Application topically 2 (two) times daily. 22 g 0   nitroGLYCERIN (NITROSTAT) 0.4 MG SL tablet DISSOLVE ONE TABLET  UNDER THE TONGUE EVERY 5 MINUTES AS NEEDED FOR CHEST PAIN.  DO NOT EXCEED A TOTAL OF 3 DOSES IN 15 MINUTES 75 tablet 0   oxyCODONE-acetaminophen (PERCOCET) 5-325 MG tablet Take 1 tablet by mouth every 4 (four) hours as needed for severe pain (pain score 7-10). 30 tablet 0   prochlorperazine (COMPAZINE) 10 MG tablet Take 1 tablet (10 mg total) by mouth every 6 (six) hours as needed for nausea or vomiting. 30 tablet 1   rosuvastatin (CRESTOR) 40 MG tablet Take 1 tablet (40 mg total) by mouth daily. 90 tablet 3   Semaglutide, 2 MG/DOSE, (OZEMPIC, 2 MG/DOSE,) 8 MG/3ML SOPN Inject 2 mg into the skin every 7 (seven) days.     senna (SENOKOT) 8.6 MG TABS tablet Take 1 tablet (8.6 mg total) by mouth daily. 120 tablet 0   sertraline (ZOLOFT) 100 MG tablet Take 200 mg by mouth daily.     sulfamethoxazole-trimethoprim (BACTRIM DS) 800-160 MG tablet Take 1 tablet by mouth 2 (two) times daily. 14 tablet 0   No current facility-administered medications for this visit.    SURGICAL HISTORY:  Past Surgical History:  Procedure Laterality Date   CARDIAC CATHETERIZATION N/A 12/14/2014   Procedure: Right/Left Heart Cath and Coronary Angiography;  Surgeon: Tonny Bollman, MD;  Location: [facility] INVASIVE CV LAB;  Service: Cardiovascular;  Laterality: N/A;   CARDIAC ELECTROPHYSIOLOGY  STUDY AND ABLATION     around 2006, Patient states that heart rate was up to 180 bpm.   CESAREAN SECTION     x2 1990, 2005   CYSTOSCOPY  08/17/2021   Procedure: CYSTOSCOPY;  Surgeon: Carrington Clamp, MD;  Location: [facility];  Service: Gynecology;;   ENDOMETRIAL ABLATION     HYSTEROSCOPY WITH D & C N/A 03/11/2021   Procedure: DILATATION AND CURETTAGE /HYSTEROSCOPY;  Surgeon: Marlow Baars, MD;  Location: [facility] OR;  Service: Gynecology;  Laterality: N/A;   OPERATIVE ULTRASOUND N/A 03/11/2021   Procedure: OPERATIVE ULTRASOUND;  Surgeon: Marlow Baars, MD;  Location: [facility] OR;  Service: Gynecology;  Laterality: N/A;  ultrasound guidance needed   ROBOT ASSISTED LAPAROSCOPIC NEPHRECTOMY Right 12/25/2022   Procedure: XI ROBOTIC ASSISTED LAPAROSCOPIC NEPHRECTOMY;  Surgeon: Malen Gauze, MD;  Location: [facility];  Service: Urology;  Laterality: Right;   ROBOTIC ASSISTED LAPAROSCOPIC HYSTERECTOMY AND SALPINGECTOMY Bilateral 08/17/2021   Procedure: XI ROBOTIC ASSISTED LAPAROSCOPIC HYSTERECTOMY AND  BILATERAL SALPINGECTOMY AND OOPHERETOMY;  Surgeon: Carrington Clamp, MD;  Location: [facility];  Service: Gynecology;  Laterality: Bilateral;    REVIEW OF SYSTEMS:  A comprehensive review of systems was negative except for: Constitutional: positive for fatigue Musculoskeletal: positive for arthralgias   PHYSICAL EXAMINATION: General appearance: alert, cooperative, fatigued, and no distress Head: Normocephalic, without obvious abnormality, atraumatic Neck: no adenopathy, no JVD, supple, symmetrical, trachea midline, and thyroid not enlarged, symmetric, no tenderness/mass/nodules Lymph nodes: Cervical, supraclavicular, and axillary nodes normal. Resp: clear to auscultation bilaterally Back: symmetric, no curvature. ROM normal. No CVA tenderness. Cardio: regular rate and rhythm, S1, S2 normal, no murmur, click, rub or gallop GI: soft, non-tender; bowel sounds normal; no masses,   no organomegaly Extremities: extremities normal, atraumatic, no cyanosis or edema  ECOG PERFORMANCE STATUS: 1 - Symptomatic but completely ambulatory  Blood pressure 137/73, pulse 74, temperature 97.8 F (36.6 C), temperature source Temporal, resp. rate 16, height 5\' 4"  (1.626 m), weight 269 lb 1.6 oz (122.1 kg), SpO2 100%.  LABORATORY DATA: Lab Results  Component Value Date   WBC 7.5 06/25/2023  HGB 14.2 06/25/2023   HCT 42.3 06/25/2023   MCV 86.5 06/25/2023   PLT 218 06/25/2023      Chemistry      Component Value Date/Time   NA 137 06/08/2023 1049   NA 140 10/19/2011 0919   K 5.3 (H) 06/08/2023 1049   K 4.1 10/19/2011 0919   CL 99 06/08/2023 1049   CL 104 10/19/2011 0919   CO2 23 06/08/2023 1049   CO2 24 10/19/2011 0919   BUN 21 06/08/2023 1049   BUN 16 10/19/2011 0919   CREATININE 1.01 (H) 06/08/2023 1049   CREATININE 0.91 05/29/2023 1238   CREATININE 0.63 10/19/2011 0919   GLU 297 04/15/2019 0000      Component Value Date/Time   CALCIUM 8.2 (L) 06/08/2023 1049   CALCIUM 8.5 10/19/2011 0919   ALKPHOS 60 06/08/2023 1049   ALKPHOS 57 10/19/2011 0919   AST 22 06/08/2023 1049   AST 17 05/29/2023 1238   ALT 19 06/08/2023 1049   ALT 16 05/29/2023 1238   ALT 24 10/19/2011 0919   BILITOT 0.3 06/08/2023 1049   BILITOT 0.5 05/29/2023 1238       RADIOGRAPHIC STUDIES: Chest 1 View Result Date: 06/19/2023 CLINICAL DATA:  Status post right nephrectomy last year. History of kidney cancer. EXAM: CHEST  1 VIEW COMPARISON:  12/28/2022 and older studies.  CT, 02/08/2023. FINDINGS: Cardiac silhouette is normal in size. No mediastinal or hilar masses. Lungs are clear.  No visualized nodules. No pleural effusion or pneumothorax. Skeletal structures are grossly intact. IMPRESSION: No active disease. Electronically Signed   By: Amie Portland M.D.   On: 06/19/2023 09:33    ASSESSMENT AND PLAN: This is a very pleasant 58 years old white female recently diagnosed with a stage III  (T3a, N0, M0) clear-cell renal cell carcinoma of the right kidney diagnosed in July 2024 status post robotic assisted right radical nephrectomy and the final pathology was consistent with clear-cell renal cell carcinoma with focal rhabdoid features, nuclear grade 4 with tumor size of 10.0 cm and tumor extends into the renal sinus fat and renal vein (pT3a).  The ureteral, vascular and all margins of resection were negative for tumor.  She is currently undergoing adjuvant treatment with immunotherapy with Keytruda 200 Mg IV every 3 weeks.  She started the first cycle of her treatment on 02/13/2023.  Status post 5 cycles.  The patient has been tolerating this treatment fairly well with no concerning adverse effects.    Renal Cell Carcinoma Status post right nephrectomy in July 2024. Currently undergoing immunotherapy with Keytruda every three weeks. Completed five cycles, here for the sixth. Plan to perform a scan after the eighth cycle to assess treatment response. Discussed potential for abdominal scan during upcoming pelvic CT to avoid redundant imaging. - Administer sixth cycle of Keytruda - Plan for scan after eighth cycle to assess treatment response  Large Abdominal Hernia Large right-sided abdominal hernia. Surgical repair on hold due to size and complexity. Discussed potential pre-surgical interventions including balloon procedure or weight loss surgery. Pelvic CT scan planned for April to evaluate hernia and abdominal anatomy. Discussed risks of mesh complications and alternative approaches, including referral to [facility] for further evaluation. - Order pelvic CT scan in April to evaluate hernia and abdominal anatomy  Left Axillary Mass Left axillary mass noted. Referred by OB for diagnostic mammogram to rule out malignancy. - Perform diagnostic mammogram of the left axilla  General Health Maintenance Hemoglobin and hematocrit levels within normal limits, ruling  out anemia as a cause of  fatigue. - Continue current medications without changes  Follow-up - Schedule follow-up appointment in three weeks for next cycle of immunotherapy.   The patient was advised to call immediately if she has any other concerning symptoms in the interval. The patient voices understanding of current disease status and treatment options and is in agreement with the current care plan.  All questions were answered. The patient knows to call the clinic with any problems, questions or concerns. We can certainly see the patient much sooner if necessary. The total time spent in the appointment was 20 minutes.  Disclaimer: This note was dictated with voice recognition software. Similar sounding words can inadvertently be transcribed and may not be corrected upon review.

## 2023-06-25 NOTE — Patient Instructions (Signed)
[facility] - A DEPT OF [facility]  Discharge Instructions: Thank you for choosing [facility] to provide your oncology and hematology care.   If you have a lab appointment with the Cancer Center, please go directly to the Cancer Center and check in at the registration area.   Wear comfortable clothing and clothing appropriate for easy access to any Portacath or PICC line.   We strive to give you quality time with your provider. You may need to reschedule your appointment if you arrive late (15 or more minutes).  Arriving late affects you and other patients whose appointments are after yours.  Also, if you miss three or more appointments without notifying the office, you may be dismissed from the clinic at the provider's discretion.      For prescription refill requests, have your pharmacy contact our office and allow 72 hours for refills to be completed.    Today you received the following chemotherapy and/or immunotherapy agents Rande Lawman       To help prevent nausea and vomiting after your treatment, we encourage you to take your nausea medication as directed.  BELOW ARE SYMPTOMS THAT SHOULD BE REPORTED IMMEDIATELY: *FEVER GREATER THAN 100.4 F (38 C) OR HIGHER *CHILLS OR SWEATING *NAUSEA AND VOMITING THAT IS NOT CONTROLLED WITH YOUR NAUSEA MEDICATION *UNUSUAL SHORTNESS OF BREATH *UNUSUAL BRUISING OR BLEEDING *URINARY PROBLEMS (pain or burning when urinating, or frequent urination) *BOWEL PROBLEMS (unusual diarrhea, constipation, pain near the anus) TENDERNESS IN MOUTH AND THROAT WITH OR WITHOUT PRESENCE OF ULCERS (sore throat, sores in mouth, or a toothache) UNUSUAL RASH, SWELLING OR PAIN  UNUSUAL VAGINAL DISCHARGE OR ITCHING   Items with * indicate a potential emergency and should be followed up as soon as possible or go to the Emergency Department if any problems should occur.  Please show the CHEMOTHERAPY ALERT CARD or IMMUNOTHERAPY  ALERT CARD at check-in to the Emergency Department and triage nurse.  Should you have questions after your visit or need to cancel or reschedule your appointment, please contact [facility] - A DEPT OF [facility]  Dept: [phone]  and follow the prompts.  Office hours are 8:00 a.m. to 4:30 p.m. Monday - Friday. Please note that voicemails left after 4:00 p.m. may not be returned until the following business day.  We are closed weekends and major holidays. You have access to a nurse at all times for urgent questions. Please call the main number to the clinic Dept: [phone] and follow the prompts.   For any non-urgent questions, you may also contact your provider using MyChart. We now offer e-Visits for anyone 110 and older to request care online for non-urgent symptoms. For details visit [email or web address].   Also download the MyChart app! Go to the app store, search "MyChart", open the app, select [place] [place], and log in with your MyChart username and password.

## 2023-06-26 DIAGNOSIS — E782 Mixed hyperlipidemia: Secondary | ICD-10-CM | POA: Diagnosis not present

## 2023-06-26 DIAGNOSIS — E1165 Type 2 diabetes mellitus with hyperglycemia: Secondary | ICD-10-CM | POA: Diagnosis not present

## 2023-06-26 DIAGNOSIS — I10 Essential (primary) hypertension: Secondary | ICD-10-CM | POA: Diagnosis not present

## 2023-06-26 LAB — T4: T4, Total: 8 ug/dL (ref 4.5–12.0)

## 2023-06-27 ENCOUNTER — Other Ambulatory Visit: Payer: Self-pay

## 2023-06-28 DIAGNOSIS — M25561 Pain in right knee: Secondary | ICD-10-CM | POA: Diagnosis not present

## 2023-06-28 DIAGNOSIS — M25562 Pain in left knee: Secondary | ICD-10-CM | POA: Diagnosis not present

## 2023-06-29 ENCOUNTER — Ambulatory Visit
Admission: RE | Admit: 2023-06-29 | Discharge: 2023-06-29 | Disposition: A | Discharge status: Home / Self Care | Payer: 59 | Source: Ambulatory Visit | Attending: Obstetrics

## 2023-06-29 DIAGNOSIS — N63 Unspecified lump in unspecified breast: Secondary | ICD-10-CM

## 2023-06-29 DIAGNOSIS — R928 Other abnormal and inconclusive findings on diagnostic imaging of breast: Secondary | ICD-10-CM | POA: Diagnosis not present

## 2023-07-02 ENCOUNTER — Ambulatory Visit: Payer: 59 | Admitting: Internal Medicine

## 2023-07-02 ENCOUNTER — Other Ambulatory Visit: Payer: 59

## 2023-07-02 ENCOUNTER — Telehealth: Payer: 59 | Admitting: Physician Assistant

## 2023-07-02 ENCOUNTER — Ambulatory Visit: Payer: 59

## 2023-07-02 DIAGNOSIS — R3989 Other symptoms and signs involving the genitourinary system: Secondary | ICD-10-CM

## 2023-07-02 MED ORDER — CEPHALEXIN 500 MG PO CAPS: 500.0000 mg | ORAL_CAPSULE | Freq: Two times a day (BID) | ORAL | 0 refills | Status: DC

## 2023-07-02 NOTE — Progress Notes (Signed)

## 2023-07-09 ENCOUNTER — Other Ambulatory Visit: Payer: 59

## 2023-07-09 ENCOUNTER — Ambulatory Visit (HOSPITAL_BASED_OUTPATIENT_CLINIC_OR_DEPARTMENT_OTHER): Payer: 59 | Admitting: Physical Therapy

## 2023-07-09 ENCOUNTER — Ambulatory Visit: Payer: 59

## 2023-07-09 ENCOUNTER — Ambulatory Visit: Payer: 59 | Admitting: Internal Medicine

## 2023-07-10 NOTE — Progress Notes (Signed)
 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 The ureteral, vascular and all margins of resection were negative for tumor. She is followed by [place].   CURRENT THERAPY:  Adjuvant treatment with immunotherapy with Keytruda 200 Mg IV every 3 weeks. First dose on 02/12/23. Status post 6 cycles.   INTERVAL HISTORY: Christina Cameron 58 y.o. female returns to the clinic today for a follow-up visit. She was last seen in the clinic by Dr. Arbutus Ped on 06/25/23.  She is currently undergoing adjuvant immunotherapy for her history of renal cell carcinoma.   The patient has been feeling fatigued. She had a sinus infection that started about 5 days ago and is currently being treated with a Z-pack. She tested negative for COVID/flu. She still has some congestion today, but thinks she is getting better. She is experiencing her normal level of shortness of breath. The patient occasionally has nausea and she takes Pepto bismol for relief. She was experiencing some constipation, but this has improved. She does report headaches 2-3 x per week. She normally takes tylenol which helps. If it is severe she will take migraine medications. Her appetite is about the same and she is trying to eat good. The patient is being referred to [facility] to see if they will do hernia surgery. They want to wait until the CT of the abdomen/pelvis is completed on April 1st. The patient is going to the dentist today to discuss possibly teeth extraction. The patients blood sugar is elevated at 287 this  morning. She states she took her metformin, but is overdue for her weekly injection. She plans to take it when she gets home.   She denies any skin rashes, vision changes, dysuria, fever, chills, night sweats. She denies any chest pain, cough, or hemoptysis. She is here today for evaluation repeat blood work for starting cycle #7  MEDICAL HISTORY: Past Medical History:  Diagnosis Date   Arm vein blood clot, unspecified laterality    around 20 years ago as of 08/10/21, Patient doesn't remember what caused it. She was not put on blood thinners.   Arthritis    right knee   Asthma    Cancer (HCC)    cervical 1989   Cancer of kidney (HCC)    Chest pain    a. normal cors by cath in 11/2014 / 03/02/2020 nuclear stress test demonstrated no perfusion defects consistent with prior infart or current ischemia. Normal study.   Chronic diastolic (congestive) heart failure (HCC)    09/27/20 Echocardiogram in Epic showed LVEF 60 to 65%.   Complication of anesthesia    post-operative nausea & vomiting   COVID-19    05/29/19 & 05/2020 Covid infections   Depression    Diabetes mellitus, type 2 (HCC)    Dysrhythmia    heart palpitations   GERD (gastroesophageal reflux disease)    H/O cardiovascular stress test 03/02/2020   03/02/2020 Nuclear stress test in Epic showed no perusion defects consistent with prior infarct or ischemia - normal study.   Headache    migraines  High cholesterol    pt takes Crestor   History of cardiac radiofrequency ablation    Hypertension    Last cardiology office visit, 10/22/20 with Rennis Harding, NP ( see note in Epic) as of 08/09/21.   Morbid (severe) obesity due to excess calories (HCC)    Neuromuscular disorder (HCC)    severe neuropathy in feet   Osteoarthritis of knee, unspecified    Palpitations    PONV (postoperative nausea and vomiting)    Post-COVID syndrome 05/2019   Pt experienced chest tightness, sob, palpitations & dizziness after Covid infection. See  04/2020 note from Rennis Harding, NP in [place].   PTSD (post-traumatic stress disorder)    Raynaud's syndrome    Sleep apnea 09/24/2020   sleep study indicated moderate sleep apea   SVT (supraventricular tachycardia) (HCC)    a. s/p ablation by Dr. Ladona Ridgel in 2006.   Wears glasses    prescripton reading glasses    ALLERGIES:  has no known allergies.  MEDICATIONS:  Current Outpatient Medications  Medication Sig Dispense Refill   albuterol (VENTOLIN HFA) 108 (90 Base) MCG/ACT inhaler Inhale 1-2 puffs into the lungs every 6 (six) hours as needed for wheezing or shortness of breath. 18 g 0   Azelastine-Fluticasone 137-50 MCG/ACT SUSP Place 1 spray into the nose every 12 (twelve) hours. (Patient not taking: Reported on 01/26/2023) 23 g 0   brexpiprazole (REXULTI) 2 MG TABS tablet Take 2 mg by mouth daily.     buPROPion (WELLBUTRIN XL) 150 MG 24 hr tablet Take 150 mg by mouth every morning.     busPIRone (BUSPAR) 10 MG tablet Take 10 mg by mouth 2 (two) times daily.     cephALEXin (KEFLEX) 500 MG capsule Take 1 capsule (500 mg total) by mouth 2 (two) times daily. 14 capsule 0   cetirizine (ZYRTEC) 10 MG tablet Take 10 mg by mouth daily as needed for allergies. (Patient not taking: Reported on 01/01/2023)     estradiol (ESTRACE) 0.5 MG tablet Take 0.5 mg by mouth daily.     ezetimibe (ZETIA) 10 MG tablet Take 1 tablet (10 mg total) by mouth daily. 90 tablet 3   furosemide (LASIX) 20 MG tablet Take 1 tablet (20 mg total) by mouth daily. (Patient taking differently: Take 20 mg by mouth daily as needed for fluid or edema.) 90 tablet 3   gabapentin (NEURONTIN) 300 MG capsule Take 1 capsule (300 mg total) by mouth 3 (three) times daily. Start at bedtime and slowly increase to 3x a day as needed to make sure no side effects like sedation. (Patient taking differently: Take 300 mg by mouth at bedtime. Start at bedtime and slowly increase to 3x a day as needed to make sure no side effects like sedation.) 90  capsule 11   hydrOXYzine (ATARAX) 50 MG tablet Take 100 mg by mouth at bedtime.     indapamide (LOZOL) 2.5 MG tablet Take 2.5 mg by mouth daily as needed (fluid).     isosorbide mononitrate (IMDUR) 30 MG 24 hr tablet Take 1 tablet (30 mg total) by mouth daily. (Patient not taking: Reported on 01/01/2023) 90 tablet 3   lamoTRIgine (LAMICTAL) 200 MG tablet Take 200 mg by mouth daily.     metFORMIN (GLUCOPHAGE) 500 MG tablet Take 1,000 mg by mouth 2 (two) times daily with a meal.     metoprolol tartrate (LOPRESSOR) 50 MG tablet Take 2 tablets (100 mg total) by mouth 2 (two) times daily. 180 tablet 3  mupirocin ointment (BACTROBAN) 2 % Apply 1 Application topically 2 (two) times daily. 30 g 2   mupirocin ointment (BACTROBAN) 2 % Apply 1 Application topically 2 (two) times daily. 22 g 0   nitroGLYCERIN (NITROSTAT) 0.4 MG SL tablet DISSOLVE ONE TABLET UNDER THE TONGUE EVERY 5 MINUTES AS NEEDED FOR CHEST PAIN.  DO NOT EXCEED A TOTAL OF 3 DOSES IN 15 MINUTES 75 tablet 0   oxyCODONE-acetaminophen (PERCOCET) 5-325 MG tablet Take 1 tablet by mouth every 4 (four) hours as needed for severe pain (pain score 7-10). 30 tablet 0   prochlorperazine (COMPAZINE) 10 MG tablet Take 1 tablet (10 mg total) by mouth every 6 (six) hours as needed for nausea or vomiting. 30 tablet 1   rosuvastatin (CRESTOR) 40 MG tablet Take 1 tablet (40 mg total) by mouth daily. 90 tablet 3   Semaglutide, 2 MG/DOSE, (OZEMPIC, 2 MG/DOSE,) 8 MG/3ML SOPN Inject 2 mg into the skin every 7 (seven) days.     sertraline (ZOLOFT) 100 MG tablet Take 200 mg by mouth daily.     No current facility-administered medications for this visit.    SURGICAL HISTORY:  Past Surgical History:  Procedure Laterality Date   CARDIAC CATHETERIZATION N/A 12/14/2014   Procedure: Right/Left Heart Cath and Coronary Angiography;  Surgeon: Tonny Bollman, MD;  [facility] INVASIVE CV LAB;  Service: Cardiovascular;  Laterality: N/A;   CARDIAC ELECTROPHYSIOLOGY STUDY  AND ABLATION     around 2006, Patient states that heart rate was up to 180 bpm.   CESAREAN SECTION     x2 1990, 2005   CYSTOSCOPY  08/17/2021   Procedure: CYSTOSCOPY;  Surgeon: Carrington Clamp, MD;  Location: [facility];  Service: Gynecology;;   ENDOMETRIAL ABLATION     HYSTEROSCOPY WITH D & C N/A 03/11/2021   Procedure: DILATATION AND CURETTAGE /HYSTEROSCOPY;  Surgeon: Marlow Baars, MD;  Location: [facility] OR;  Service: Gynecology;  Laterality: N/A;   OPERATIVE ULTRASOUND N/A 03/11/2021   Procedure: OPERATIVE ULTRASOUND;  Surgeon: Marlow Baars, MD;  Location: [facility] OR;  Service: Gynecology;  Laterality: N/A;  ultrasound guidance needed   ROBOT ASSISTED LAPAROSCOPIC NEPHRECTOMY Right 12/25/2022   Procedure: XI ROBOTIC ASSISTED LAPAROSCOPIC NEPHRECTOMY;  Surgeon: Malen Gauze, MD;  Location: [facility];  Service: Urology;  Laterality: Right;   ROBOTIC ASSISTED LAPAROSCOPIC HYSTERECTOMY AND SALPINGECTOMY Bilateral 08/17/2021   Procedure: XI ROBOTIC ASSISTED LAPAROSCOPIC HYSTERECTOMY AND  BILATERAL SALPINGECTOMY AND OOPHERETOMY;  Surgeon: Carrington Clamp, MD;  Location: [facility] [place];  Service: Gynecology;  Laterality: Bilateral;    REVIEW OF SYSTEMS:   Review of Systems  Constitutional: Positive for fatigue. Negative for appetite change, chills, fever and unexpected weight change.  HENT: Negative for mouth sores, nosebleeds, sore throat and trouble swallowing.   Eyes: Negative for eye problems and icterus.  Respiratory: Positive for baseline dyspnea. Negative for cough, hemoptysis,  and wheezing.   Cardiovascular: Negative for chest pain and leg swelling.  Gastrointestinal: positive for intermittent nausea. Negative for abdominal pain, diarrhea, and vomiting. Positive for constipation. Positive for hernia.  Genitourinary: Negative for bladder incontinence, difficulty urinating, dysuria, frequency and hematuria.   Musculoskeletal: Positive for chronic intermittent low  back pain. Negative for  gait problem, neck pain and neck stiffness.  Skin: Negative for itching and rash.  Neurological: Negative for dizziness, extremity weakness, gait problem, light-headedness and seizures. Positive for headaches. Hematological: Negative for adenopathy. Does not bruise/bleed easily.  Psychiatric/Behavioral: Negative for confusion, depression and sleep disturbance. The patient is not  nervous/anxious.     PHYSICAL EXAMINATION:  There were no vitals taken for this visit.  ECOG PERFORMANCE STATUS: 1  Physical Exam  Constitutional: Oriented to person, place, and time and well-developed, well-nourished, and in no distress.  HENT:  Head: Normocephalic and atraumatic.  Mouth/Throat: Oropharynx is clear and moist. No oropharyngeal exudate.  Eyes: Conjunctivae are normal. Right eye exhibits no discharge. Left eye exhibits no discharge. No scleral icterus.  Neck: Normal range of motion. Neck supple.  Cardiovascular: Normal rate, regular rhythm, normal heart sounds and intact distal pulses.   Pulmonary/Chest: Effort normal and breath sounds normal. No respiratory distress. No wheezes. No rales.  Abdominal: Soft. Bowel sounds are normal. Exhibits no distension and no mass. There is no tenderness. Positive for abdominal hernia.  Musculoskeletal: Normal range of motion. Exhibits no edema.  Lymphadenopathy:    No cervical adenopathy.  Neurological: Alert and oriented to person, place, and time. Exhibits normal muscle tone. Gait normal. Coordination normal.  Skin: Skin is warm and dry. No rash noted. Not diaphoretic. No erythema. No pallor.  Psychiatric: Mood, memory and judgment normal.  Vitals reviewed.  LABORATORY DATA: Lab Results  Component Value Date   WBC 7.5 06/25/2023   HGB 14.2 06/25/2023   HCT 42.3 06/25/2023   MCV 86.5 06/25/2023   PLT 218 06/25/2023      Chemistry      Component Value Date/Time   NA 135 06/25/2023 0856   NA 137 06/08/2023 1049   NA 140  10/19/2011 0919   K 4.5 06/25/2023 0856   K 4.1 10/19/2011 0919   CL 103 06/25/2023 0856   CL 104 10/19/2011 0919   CO2 24 06/25/2023 0856   CO2 24 10/19/2011 0919   BUN 27 (H) 06/25/2023 0856   BUN 21 06/08/2023 1049   BUN 16 10/19/2011 0919   CREATININE 0.97 06/25/2023 0856   CREATININE 0.63 10/19/2011 0919   GLU 297 04/15/2019 0000      Component Value Date/Time   CALCIUM 9.4 06/25/2023 0856   CALCIUM 8.5 10/19/2011 0919   ALKPHOS 50 06/25/2023 0856   ALKPHOS 57 10/19/2011 0919   AST 17 06/25/2023 0856   ALT 18 06/25/2023 0856   ALT 24 10/19/2011 0919   BILITOT 0.5 06/25/2023 0856       RADIOGRAPHIC STUDIES:  MM 3D DIAGNOSTIC MAMMOGRAM BILATERAL BREAST Result Date: 06/29/2023 CLINICAL DATA:  58 year old female presenting for annual exam and evaluation of a new lump in the left axilla. EXAM: DIGITAL DIAGNOSTIC BILATERAL MAMMOGRAM WITH TOMOSYNTHESIS AND CAD; ULTRASOUND LEFT BREAST LIMITED TECHNIQUE: Bilateral digital diagnostic mammography and breast tomosynthesis was performed. The images were evaluated with computer-aided detection. ; Targeted ultrasound examination of the left breast was performed. COMPARISON:  Previous exam(s). ACR Breast Density Category b: There are scattered areas of fibroglandular density. FINDINGS: Mammogram: Right breast: No suspicious mass, distortion, or microcalcifications are identified to suggest presence of malignancy. Left breast: A spot compression tomosynthesis view and full true lateral tomosynthesis view were performed for a questioned asymmetry seen only on MLO view in the upper left breast. On the additional imaging the asymmetry effaces and is most consistent with normal overlapping tissue. There is no suspicious finding in the left breast. A spot tangential view of the palpable area of concern in the left axilla is performed demonstrating no definite abnormality. There is fat density in this area and two normal appearing lymph nodes. On  physical exam at the site of concern reported by the patient in the  left axilla I feel a soft bulge possibly a fat pad. Ultrasound: Targeted ultrasound is performed at the palpable site of concern in the left axilla demonstrating no cystic or solid mass. No definite lipoma. Multiple normal lymph nodes are visualized. IMPRESSION: 1. At the palpable site of concern in the left axilla there is fat density tissue, possibly a fat pad. No definite lipoma. Multiple normal lymph nodes are visualized. 2.  No mammographic evidence of malignancy bilaterally. RECOMMENDATION: Screening mammogram in one year.(Code:SM-B-01Y) I have discussed the findings and recommendations with the patient. If applicable, a reminder letter will be sent to the patient regarding the next appointment. BI-RADS CATEGORY  2: Benign. Electronically Signed   By: Emmaline Kluver M.D.   On: 06/29/2023 10:23   [place] LIMITED ULTRASOUND INCLUDING AXILLA LEFT BREAST  Result Date: 06/29/2023 CLINICAL DATA:  58 year old female presenting for annual exam and evaluation of a new lump in the left axilla. EXAM: DIGITAL DIAGNOSTIC BILATERAL MAMMOGRAM WITH TOMOSYNTHESIS AND CAD; ULTRASOUND LEFT BREAST LIMITED TECHNIQUE: Bilateral digital diagnostic mammography and breast tomosynthesis was performed. The images were evaluated with computer-aided detection. ; Targeted ultrasound examination of the left breast was performed. COMPARISON:  Previous exam(s). ACR Breast Density Category b: There are scattered areas of fibroglandular density. FINDINGS: Mammogram: Right breast: No suspicious mass, distortion, or microcalcifications are identified to suggest presence of malignancy. Left breast: A spot compression tomosynthesis view and full true lateral tomosynthesis view were performed for a questioned asymmetry seen only on MLO view in the upper left breast. On the additional imaging the asymmetry effaces and is most consistent with normal overlapping tissue. There is no  suspicious finding in the left breast. A spot tangential view of the palpable area of concern in the left axilla is performed demonstrating no definite abnormality. There is fat density in this area and two normal appearing lymph nodes. On physical exam at the site of concern reported by the patient in the left axilla I feel a soft bulge possibly a fat pad. Ultrasound: Targeted ultrasound is performed at the palpable site of concern in the left axilla demonstrating no cystic or solid mass. No definite lipoma. Multiple normal lymph nodes are visualized. IMPRESSION: 1. At the palpable site of concern in the left axilla there is fat density tissue, possibly a fat pad. No definite lipoma. Multiple normal lymph nodes are visualized. 2.  No mammographic evidence of malignancy bilaterally. RECOMMENDATION: Screening mammogram in one year.(Code:SM-B-01Y) I have discussed the findings and recommendations with the patient. If applicable, a reminder letter will be sent to the patient regarding the next appointment. BI-RADS CATEGORY  2: Benign. Electronically Signed   By: Emmaline Kluver M.D.   On: 06/29/2023 10:23   Chest 1 View Result Date: 06/19/2023 CLINICAL DATA:  Status post right nephrectomy last year. History of kidney cancer. EXAM: CHEST  1 VIEW COMPARISON:  12/28/2022 and older studies.  CT, 02/08/2023. FINDINGS: Cardiac silhouette is normal in size. No mediastinal or hilar masses. Lungs are clear.  No visualized nodules. No pleural effusion or pneumothorax. Skeletal structures are grossly intact. IMPRESSION: No active disease. Electronically Signed   By: Amie Portland M.D.   On: 06/19/2023 09:33     ASSESSMENT/PLAN:  This is a very pleasant 58 year old Caucasian female diagnosed with stage III (T3a, N0, M0) clear-cell renal cell carcinoma\of the right kidney.  The patient was diagnosed in July 2024.  She is status post robot-assisted right radical nephrectomy.  The final pathology showed clear-cell renal  cell  carcinoma with focal rhabdoid features, nuclear grade 4, with a tumor size of 10 cm and the tumor extended into the renal sinus fat and renal vein (P T3a).  The margins were negative for tumor.   The patient is currently on adjuvant immunotherapy with Keytruda 200 mg IV every 3 weeks. She is status post 6 cycles of treatment and has been tolerating it fair.    Labs were reviewed.  Recommend that she proceed with cycle #7 today as scheduled.   We will see her back for follow-up visit in 3 weeks for evaluation repeat blood work before undergoing her next cycle of treatment.    She is scheduled for her next CT AP on 08/28/23. I reviewed with Dr. Arbutus Ped. This is adequate and we do not need to add CT chest.   It would be ideal if the patient requires dental extractions to wait until she is finished with treatment, however, if needed, it is ok to proceed with extractions but would recommend performing this in the middle of her 3 week cycle.     She will see a surgeon at [facility] regarding her abdominal hernia. They are planning on referring her after her next CT AP on 08/28/23.   The patient was advised to call immediately if she has any concerning symptoms in the interval. The patient voices understanding of current disease status and treatment options and is in agreement with the current care plan. All questions were answered. The patient knows to call the clinic with any problems, questions or concerns. We can certainly see the patient much sooner if necessary      No orders of the defined types were placed in this encounter.    The total time spent in the appointment was 20-29 minutes  Toyia Jelinek L Ascencion Stegner, PA-C 07/10/23

## 2023-07-11 DIAGNOSIS — R030 Elevated blood-pressure reading, without diagnosis of hypertension: Secondary | ICD-10-CM | POA: Diagnosis not present

## 2023-07-11 DIAGNOSIS — J019 Acute sinusitis, unspecified: Secondary | ICD-10-CM | POA: Diagnosis not present

## 2023-07-11 DIAGNOSIS — R059 Cough, unspecified: Secondary | ICD-10-CM | POA: Diagnosis not present

## 2023-07-12 ENCOUNTER — Other Ambulatory Visit: Payer: Self-pay

## 2023-07-12 NOTE — Telephone Encounter (Signed)
See below

## 2023-07-16 ENCOUNTER — Encounter: Payer: Self-pay | Admitting: Internal Medicine

## 2023-07-16 ENCOUNTER — Inpatient Hospital Stay: Payer: 59

## 2023-07-16 ENCOUNTER — Inpatient Hospital Stay: Payer: 59 | Attending: Internal Medicine

## 2023-07-16 ENCOUNTER — Inpatient Hospital Stay (HOSPITAL_BASED_OUTPATIENT_CLINIC_OR_DEPARTMENT_OTHER): Payer: 59 | Admitting: Physician Assistant

## 2023-07-16 VITALS — BP 153/81 | HR 72 | Temp 97.7°F | Resp 18 | Wt 267.1 lb

## 2023-07-16 VITALS — BP 131/80 | HR 69 | Resp 16

## 2023-07-16 DIAGNOSIS — C641 Malignant neoplasm of right kidney, except renal pelvis: Secondary | ICD-10-CM

## 2023-07-16 DIAGNOSIS — Z905 Acquired absence of kidney: Secondary | ICD-10-CM | POA: Insufficient documentation

## 2023-07-16 DIAGNOSIS — Z5112 Encounter for antineoplastic immunotherapy: Secondary | ICD-10-CM

## 2023-07-16 DIAGNOSIS — Z79899 Other long term (current) drug therapy: Secondary | ICD-10-CM | POA: Insufficient documentation

## 2023-07-16 LAB — CMP (CANCER CENTER ONLY)
ALT: 18 U/L (ref 0–44)
AST: 14 U/L — ABNORMAL LOW (ref 15–41)
Albumin: 4 g/dL (ref 3.5–5.0)
Alkaline Phosphatase: 50 U/L (ref 38–126)
Anion gap: 8 (ref 5–15)
BUN: 24 mg/dL — ABNORMAL HIGH (ref 6–20)
CO2: 23 mmol/L (ref 22–32)
Calcium: 9.3 mg/dL (ref 8.9–10.3)
Chloride: 104 mmol/L (ref 98–111)
Creatinine: 0.93 mg/dL (ref 0.44–1.00)
GFR, Estimated: >60 mL/min (ref >60)
Glucose, Bld: 287 mg/dL — ABNORMAL HIGH (ref 70–99)
Potassium: 4.3 mmol/L (ref 3.5–5.1)
Sodium: 135 mmol/L (ref 135–145)
Total Bilirubin: 0.3 mg/dL (ref 0.0–1.2)
Total Protein: 6.8 g/dL (ref 6.5–8.1)

## 2023-07-16 LAB — CBC WITH DIFFERENTIAL (CANCER CENTER ONLY)
Abs Immature Granulocytes: 0.04 10*3/uL (ref 0.00–0.07)
Basophils Absolute: 0.1 10*3/uL (ref 0.0–0.1)
Basophils Relative: 1 %
Eosinophils Absolute: 0.3 10*3/uL (ref 0.0–0.5)
Eosinophils Relative: 3 %
HCT: 40.7 % (ref 36.0–46.0)
Hemoglobin: 13.8 g/dL (ref 12.0–15.0)
Immature Granulocytes: 1 %
Lymphocytes Relative: 27 %
Lymphs Abs: 2.4 10*3/uL (ref 0.7–4.0)
MCH: 29.4 pg (ref 26.0–34.0)
MCHC: 33.9 g/dL (ref 30.0–36.0)
MCV: 86.6 fL (ref 80.0–100.0)
Monocytes Absolute: 0.7 10*3/uL (ref 0.1–1.0)
Monocytes Relative: 8 %
Neutro Abs: 5.4 10*3/uL (ref 1.7–7.7)
Neutrophils Relative %: 60 %
Platelet Count: 228 10*3/uL (ref 150–400)
RBC: 4.7 MIL/uL (ref 3.87–5.11)
RDW: 13.2 % (ref 11.5–15.5)
WBC Count: 8.8 10*3/uL (ref 4.0–10.5)
nRBC: 0 % (ref 0.0–0.2)

## 2023-07-16 LAB — TSH: TSH: 3.675 u[IU]/mL (ref 0.350–4.500)

## 2023-07-16 MED ORDER — SODIUM CHLORIDE 0.9 % IV SOLN: Freq: Once | INTRAVENOUS | Status: AC

## 2023-07-16 MED ORDER — SODIUM CHLORIDE 0.9 % IV SOLN
200.0000 mg | Freq: Once | INTRAVENOUS | Status: AC
  Administered 2023-07-16: 200 mg via INTRAVENOUS
  Filled 2023-07-16: qty 200

## 2023-07-16 MED ORDER — PROCHLORPERAZINE MALEATE 10 MG PO TABS: 10.0000 mg | ORAL_TABLET | Freq: Four times a day (QID) | ORAL | 1 refills | Status: AC | PRN

## 2023-07-16 NOTE — Patient Instructions (Signed)
 [facility] - A DEPT OF [name]  Discharge Instructions: Thank you for choosing [name] to provide your oncology and hematology care.   If you have a lab appointment with the Cancer Center, please go directly to the Cancer Center and check in at the registration area.   Wear comfortable clothing and clothing appropriate for easy access to any Portacath or PICC line.   We strive to give you quality time with your provider. You may need to reschedule your appointment if you arrive late (15 or more minutes).  Arriving late affects you and other patients whose appointments are after yours.  Also, if you miss three or more appointments without notifying the office, you may be dismissed from the clinic at the provider's discretion.      For prescription refill requests, have your pharmacy contact our office and allow 72 hours for refills to be completed.    Today you received the following chemotherapy and/or immunotherapy agents Christina Cameron      To help prevent nausea and vomiting after your treatment, we encourage you to take your nausea medication as directed.  BELOW ARE SYMPTOMS THAT SHOULD BE REPORTED IMMEDIATELY: *FEVER GREATER THAN 100.4 F (38 C) OR HIGHER *CHILLS OR SWEATING *NAUSEA AND VOMITING THAT IS NOT CONTROLLED WITH YOUR NAUSEA MEDICATION *UNUSUAL SHORTNESS OF BREATH *UNUSUAL BRUISING OR BLEEDING *URINARY PROBLEMS (pain or burning when urinating, or frequent urination) *BOWEL PROBLEMS (unusual diarrhea, constipation, pain near the anus) TENDERNESS IN MOUTH AND THROAT WITH OR WITHOUT PRESENCE OF ULCERS (sore throat, sores in mouth, or a toothache) UNUSUAL RASH, SWELLING OR PAIN  UNUSUAL VAGINAL DISCHARGE OR ITCHING   Items with * indicate a potential emergency and should be followed up as soon as possible or go to the Emergency Department if any problems should occur.  Please show the CHEMOTHERAPY ALERT CARD or IMMUNOTHERAPY  ALERT CARD at check-in to the Emergency Department and triage nurse.  Should you have questions after your visit or need to cancel or reschedule your appointment, please contact [facility] - A DEPT OF [facility]  Dept: [phone]  and follow the prompts.  Office hours are 8:00 a.m. to 4:30 p.m. Monday - Friday. Please note that voicemails left after 4:00 p.m. may not be returned until the following business day.  We are closed weekends and major holidays. You have access to a nurse at all times for urgent questions. Please call the main number to the clinic Dept: [phone] and follow the prompts.   For any non-urgent questions, you may also contact your provider using MyChart. We now offer e-Visits for anyone 85 and older to request care online for non-urgent symptoms. For details visit [email or web address].   Also download the MyChart app! Go to the app store, search "MyChart", open the app, select , and log in with your MyChart username and password.

## 2023-07-17 LAB — T4: T4, Total: 7.8 ug/dL (ref 4.5–12.0)

## 2023-07-27 ENCOUNTER — Ambulatory Visit: Payer: 59 | Admitting: Podiatry

## 2023-07-30 ENCOUNTER — Ambulatory Visit: Payer: 59

## 2023-07-30 ENCOUNTER — Ambulatory Visit: Payer: 59 | Admitting: Physician Assistant

## 2023-07-30 ENCOUNTER — Encounter: Payer: Self-pay | Admitting: Internal Medicine

## 2023-07-30 ENCOUNTER — Other Ambulatory Visit: Payer: 59

## 2023-07-31 ENCOUNTER — Ambulatory Visit: Payer: 59 | Admitting: Orthopedic Surgery

## 2023-07-31 NOTE — Progress Notes (Signed)
 [name] OFFICE PROGRESS NOTE  Practice, [facility] [address] [place] [address]  DIAGNOSIS: Stage III (T3a, N0, M0) clear-cell renal cell carcinoma of the right kidney diagnosed in July 2024   PRIOR THERAPY: status post robotic assisted right radical nephrectomy and the final pathology was consistent with clear-cell renal cell carcinoma with focal rhabdoid features, nuclear grade 4 with tumor size of 10.0 cm and tumor extends into the renal sinus fat and renal vein (pT3a). The ureteral, vascular and all margins of resection were negative for tumor. She is followed by [name].   CURRENT THERAPY: Adjuvant treatment with immunotherapy with Keytruda 200 Mg IV every 3 weeks. First dose on 02/12/23. Status post 7 cycles.   INTERVAL HISTORY: Christina Cameron 58 y.o. female returns to the clinic today for a follow-up visit. She was last seen in the clinic by myself on 07/16/23. She is currently undergoing adjuvant immunotherapy for her history of renal cell carcinoma.  The patient continues to endorse fatigue.  She also has intermittent nausea for which she has Compazine and Zofran.  She is not able to associate the nausea with any particular provoking factors such as certain medications or constipation.  She tells me that she is expected to see a dentist tomorrow to see if she needs a dental extraction.  She wants to make sure this is okay with her immunotherapy.  She also tells me that she is supposed to see a hand specialist due to some stiffness and decreased grip strength in her right hand.  She wants to know if there would be any contraindication for arthritis medication with her immunotherapy.  She denies any fever, chills, night sweats, or unexplained weight loss.  She reports her dyspnea on exertion is the same.  She denies any cough, chest pain, or hemoptysis.  She denies any vomiting.  She has constipation now and then but overall nothing significant at this time.  She does  have frequent headaches at baseline a few times per week for which she takes Tylenol which helps.  If she ever has a more severe headache, she will take migraine medication.  She is expected to see a Careers adviser in [place] and may for a hernia.  They are going to decide when she gets her restaging CT scan of the abdomen and pelvis on April 1 to see if she should be referred to a surgeon at [facility].  She denies any rashes or skin changes.  She is here today for evaluation and repeat blood work before undergoing cycle #9.   MEDICAL HISTORY: Past Medical History:  Diagnosis Date   Arm vein blood clot, unspecified laterality    around 20 years ago as of 08/10/21, Patient doesn't remember what caused it. She was not put on blood thinners.   Arthritis    right knee   Asthma    Cancer (HCC)    cervical 1989   Cancer of kidney (HCC)    Chest pain    a. normal cors by cath in 11/2014 / 03/02/2020 nuclear stress test demonstrated no perfusion defects consistent with prior infart or current ischemia. Normal study.   Chronic diastolic (congestive) heart failure (HCC)    09/27/20 Echocardiogram in Epic showed LVEF 60 to 65%.   Complication of anesthesia    post-operative nausea & vomiting   COVID-19    05/29/19 & 05/2020 Covid infections   Depression    Diabetes mellitus, type 2 (HCC)    Dysrhythmia  heart palpitations   GERD (gastroesophageal reflux disease)    H/O cardiovascular stress test 03/02/2020   03/02/2020 Nuclear stress test in Epic showed no perusion defects consistent with prior infarct or ischemia - normal study.   Headache    migraines   High cholesterol    pt takes Crestor   History of cardiac radiofrequency ablation    Hypertension    Last cardiology office visit, 10/22/20 with Rennis Harding, NP ( see note in Epic) as of 08/09/21.   Morbid (severe) obesity due to excess calories (HCC)    Neuromuscular disorder (HCC)    severe neuropathy in feet   Osteoarthritis of knee, unspecified     Palpitations    PONV (postoperative nausea and vomiting)    Post-COVID syndrome 05/2019   Pt experienced chest tightness, sob, palpitations & dizziness after Covid infection. See 04/2020 note from Rennis Harding, NP in [place].   PTSD (post-traumatic stress disorder)    Raynaud's syndrome    Sleep apnea 09/24/2020   sleep study indicated moderate sleep apea   SVT (supraventricular tachycardia) (HCC)    a. s/p ablation by Dr. Ladona Ridgel in 2006.   Wears glasses    prescripton reading glasses    ALLERGIES:  has no known allergies.  MEDICATIONS:  Current Outpatient Medications  Medication Sig Dispense Refill   albuterol (VENTOLIN HFA) 108 (90 Base) MCG/ACT inhaler Inhale 1-2 puffs into the lungs every 6 (six) hours as needed for wheezing or shortness of breath. 18 g 0   Azelastine-Fluticasone 137-50 MCG/ACT SUSP Place 1 spray into the nose every 12 (twelve) hours. (Patient not taking: Reported on 01/26/2023) 23 g 0   brexpiprazole (REXULTI) 2 MG TABS tablet Take 2 mg by mouth daily.     buPROPion (WELLBUTRIN XL) 150 MG 24 hr tablet Take 150 mg by mouth every morning.     busPIRone (BUSPAR) 10 MG tablet Take 10 mg by mouth 2 (two) times daily.     cephALEXin (KEFLEX) 500 MG capsule Take 1 capsule (500 mg total) by mouth 2 (two) times daily. 14 capsule 0   cetirizine (ZYRTEC) 10 MG tablet Take 10 mg by mouth daily as needed for allergies. (Patient not taking: Reported on 01/01/2023)     estradiol (ESTRACE) 0.5 MG tablet Take 0.5 mg by mouth daily.     ezetimibe (ZETIA) 10 MG tablet Take 1 tablet (10 mg total) by mouth daily. 90 tablet 3   furosemide (LASIX) 20 MG tablet Take 1 tablet (20 mg total) by mouth daily. (Patient taking differently: Take 20 mg by mouth daily as needed for fluid or edema.) 90 tablet 3   gabapentin (NEURONTIN) 300 MG capsule Take 1 capsule (300 mg total) by mouth 3 (three) times daily. Start at bedtime and slowly increase to 3x a day as needed to make sure no side effects like  sedation. (Patient taking differently: Take 300 mg by mouth at bedtime. Start at bedtime and slowly increase to 3x a day as needed to make sure no side effects like sedation.) 90 capsule 11   hydrOXYzine (ATARAX) 50 MG tablet Take 100 mg by mouth at bedtime.     indapamide (LOZOL) 2.5 MG tablet Take 2.5 mg by mouth daily as needed (fluid).     isosorbide mononitrate (IMDUR) 30 MG 24 hr tablet Take 1 tablet (30 mg total) by mouth daily. (Patient not taking: Reported on 01/01/2023) 90 tablet 3   lamoTRIgine (LAMICTAL) 200 MG tablet Take 200 mg by mouth daily.  metFORMIN (GLUCOPHAGE) 500 MG tablet Take 1,000 mg by mouth 2 (two) times daily with a meal.     metoprolol tartrate (LOPRESSOR) 50 MG tablet Take 2 tablets (100 mg total) by mouth 2 (two) times daily. 180 tablet 3   mupirocin ointment (BACTROBAN) 2 % Apply 1 Application topically 2 (two) times daily. 30 g 2   mupirocin ointment (BACTROBAN) 2 % Apply 1 Application topically 2 (two) times daily. 22 g 0   nitroGLYCERIN (NITROSTAT) 0.4 MG SL tablet DISSOLVE ONE TABLET UNDER THE TONGUE EVERY 5 MINUTES AS NEEDED FOR CHEST PAIN.  DO NOT EXCEED A TOTAL OF 3 DOSES IN 15 MINUTES 75 tablet 0   oxyCODONE-acetaminophen (PERCOCET) 5-325 MG tablet Take 1 tablet by mouth every 4 (four) hours as needed for severe pain (pain score 7-10). 30 tablet 0   prochlorperazine (COMPAZINE) 10 MG tablet Take 1 tablet (10 mg total) by mouth every 6 (six) hours as needed for nausea or vomiting. 30 tablet 1   rosuvastatin (CRESTOR) 40 MG tablet Take 1 tablet (40 mg total) by mouth daily. 90 tablet 3   Semaglutide, 2 MG/DOSE, (OZEMPIC, 2 MG/DOSE,) 8 MG/3ML SOPN Inject 2 mg into the skin every 7 (seven) days.     sertraline (ZOLOFT) 100 MG tablet Take 200 mg by mouth daily.     No current facility-administered medications for this visit.    SURGICAL HISTORY:  Past Surgical History:  Procedure Laterality Date   CARDIAC CATHETERIZATION N/A 12/14/2014   Procedure:  Right/Left Heart Cath and Coronary Angiography;  Surgeon: Tonny Bollman, MD;  Location: [facility] INVASIVE CV LAB;  Service: Cardiovascular;  Laterality: N/A;   CARDIAC ELECTROPHYSIOLOGY STUDY AND ABLATION     around 2006, Patient states that heart rate was up to 180 bpm.   CESAREAN SECTION     x2 1990, 2005   CYSTOSCOPY  08/17/2021   Procedure: CYSTOSCOPY;  Surgeon: Carrington Clamp, MD;  Location: [facility];  Service: Gynecology;;   ENDOMETRIAL ABLATION     HYSTEROSCOPY WITH D & C N/A 03/11/2021   Procedure: DILATATION AND CURETTAGE /HYSTEROSCOPY;  Surgeon: Marlow Baars, MD;  Location: [facility] OR;  Service: Gynecology;  Laterality: N/A;   OPERATIVE ULTRASOUND N/A 03/11/2021   Procedure: OPERATIVE ULTRASOUND;  Surgeon: Marlow Baars, MD;  Location: [facility] OR;  Service: Gynecology;  Laterality: N/A;  ultrasound guidance needed   ROBOT ASSISTED LAPAROSCOPIC NEPHRECTOMY Right 12/25/2022   Procedure: XI ROBOTIC ASSISTED LAPAROSCOPIC NEPHRECTOMY;  Surgeon: Malen Gauze, MD;  Location: [facility];  Service: Urology;  Laterality: Right;   ROBOTIC ASSISTED LAPAROSCOPIC HYSTERECTOMY AND SALPINGECTOMY Bilateral 08/17/2021   Procedure: XI ROBOTIC ASSISTED LAPAROSCOPIC HYSTERECTOMY AND  BILATERAL SALPINGECTOMY AND OOPHERETOMY;  Surgeon: Carrington Clamp, MD;  Location: [facility];  Service: Gynecology;  Laterality: Bilateral;    REVIEW OF SYSTEMS:   Constitutional: Positive for fatigue. Negative for appetite change, chills, fever and unexpected weight change.  HENT: Negative for mouth sores, nosebleeds, sore throat and trouble swallowing.   Eyes: Negative for eye problems and icterus.  Respiratory: Positive for baseline dyspnea. Negative for cough, hemoptysis,  and wheezing.   Cardiovascular: Negative for chest pain and leg swelling.  Gastrointestinal: positive for intermittent nausea. Negative for abdominal pain, diarrhea, and vomiting. Positive for intermittent constipation.  Positive for hernia.  Genitourinary: Negative for bladder incontinence, difficulty urinating, dysuria, frequency and hematuria.   Musculoskeletal: Positive for chronic intermittent low back pain. Negative for  gait problem, neck pain and neck stiffness.  Skin:  Positive for stable subcutaneous nodule in central neck. Negative for itching and rash.  Neurological: Negative for dizziness, extremity weakness, gait problem, light-headedness and seizures. Positive for intermittent headaches. Hematological: Negative for adenopathy. Does not bruise/bleed easily.  Psychiatric/Behavioral: Negative for confusion, depression and sleep disturbance. The patient is not nervous/anxious.     PHYSICAL EXAMINATION:  Blood pressure (!) 151/79, pulse 86, temperature 97.6 F (36.4 C), temperature source Temporal, resp. rate 16, weight 270 lb 1.6 oz (122.5 kg), SpO2 100%.  ECOG PERFORMANCE STATUS: 1  Physical Exam  Constitutional: Oriented to person, place, and time and well-developed, well-nourished, and in no distress.  HENT:  Head: Normocephalic and atraumatic.  Mouth/Throat: Oropharynx is clear and moist. No oropharyngeal exudate.  Eyes: Conjunctivae are normal. Right eye exhibits no discharge. Left eye exhibits no discharge. No scleral icterus.  Neck: Normal range of motion. Neck supple.  Cardiovascular: Normal rate, regular rhythm, normal heart sounds and intact distal pulses.   Pulmonary/Chest: Effort normal and breath sounds normal. No respiratory distress. No wheezes. No rales.  Abdominal: Soft. Bowel sounds are normal. Exhibits no distension and no mass. There is no tenderness. Positive for abdominal hernia.  Musculoskeletal: Normal range of motion. Exhibits no edema.  Lymphadenopathy:    No cervical adenopathy.  Neurological: Alert and oriented to person, place, and time. Exhibits normal muscle tone. Gait normal. Coordination normal.  Skin: Skin is warm and dry. No rash noted. Not diaphoretic. No  erythema. No pallor.  Psychiatric: Mood, memory and judgment normal.  Vitals reviewed.  LABORATORY DATA: Lab Results  Component Value Date   WBC 7.0 08/06/2023   HGB 13.9 08/06/2023   HCT 41.5 08/06/2023   MCV 88.5 08/06/2023   PLT 202 08/06/2023      Chemistry      Component Value Date/Time   NA 135 07/16/2023 1058   NA 137 06/08/2023 1049   NA 140 10/19/2011 0919   K 4.3 07/16/2023 1058   K 4.1 10/19/2011 0919   CL 104 07/16/2023 1058   CL 104 10/19/2011 0919   CO2 23 07/16/2023 1058   CO2 24 10/19/2011 0919   BUN 24 (H) 07/16/2023 1058   BUN 21 06/08/2023 1049   BUN 16 10/19/2011 0919   CREATININE 0.93 07/16/2023 1058   CREATININE 0.63 10/19/2011 0919   GLU 297 04/15/2019 0000      Component Value Date/Time   CALCIUM 9.3 07/16/2023 1058   CALCIUM 8.5 10/19/2011 0919   ALKPHOS 50 07/16/2023 1058   ALKPHOS 57 10/19/2011 0919   AST 14 (L) 07/16/2023 1058   ALT 18 07/16/2023 1058   ALT 24 10/19/2011 0919   BILITOT 0.3 07/16/2023 1058       RADIOGRAPHIC STUDIES:  No results found.   ASSESSMENT/PLAN:  This is a very pleasant 58 year old Caucasian female diagnosed with stage III (T3a, N0, M0) clear-cell renal cell carcinoma\of the right kidney.  The patient was diagnosed in July 2024.  She is status post robot-assisted right radical nephrectomy.  The final pathology showed clear-cell renal cell carcinoma with focal rhabdoid features, nuclear grade 4, with a tumor size of 10 cm and the tumor extended into the renal sinus fat and renal vein (P T3a).  The margins were negative for tumor.   The patient is currently on adjuvant immunotherapy with Keytruda 200 mg IV every 3 weeks. She is status post 7 cycles of treatment and has been tolerating it fair.   Labs were reviewed.  Recommend that she proceed with cycle #  8 today as scheduled.   We will see her back for follow-up visit in 3 weeks for evaluation repeat blood work before undergoing her next cycle of treatment.     She is scheduled for her next CT AP on 08/28/23. I previously reviewed with Dr. Arbutus Ped. This is adequate and we do not need to add CT chest.   She will see a surgeon at [facility] regarding her abdominal hernia. They are planning on referring her after her next CT [facility] on 08/28/23.   I let her know that it is okay if she needs dental extractions, although it may be best to have a dental extraction in between her cycles of treatment.  If she needs to see a hand specialist for her stiffness in her right hand, it is okay for what ever medications they would recommend except we generally would want to know prior to being treated with steroids or prednisone.  If the patient absolutely has to be on steroids, we would prefer for it to be 10 mg or less.   The patient continues to have a stable subcutaneous nodule in the central aspect of her neck which has been there for some time.  I offered a ultrasound of her neck which she declined at this time but knows I would be happy to order in the future if needed.  The patient was advised to call immediately if she has any concerning symptoms in the interval. The patient voices understanding of current disease status and treatment options and is in agreement with the current care plan. All questions were answered. The patient knows to call the clinic with any problems, questions or concerns. We can certainly see the patient much sooner if necessary   No orders of the defined types were placed in this encounter.    The total time spent in the appointment was 20-29 minutes  Anjelika Ausburn L Giannah Zavadil, PA-C 08/06/23

## 2023-08-01 ENCOUNTER — Encounter: Payer: Self-pay | Admitting: Internal Medicine

## 2023-08-06 ENCOUNTER — Inpatient Hospital Stay (HOSPITAL_BASED_OUTPATIENT_CLINIC_OR_DEPARTMENT_OTHER): Payer: 59 | Admitting: Physician Assistant

## 2023-08-06 ENCOUNTER — Inpatient Hospital Stay: Payer: 59

## 2023-08-06 ENCOUNTER — Encounter: Payer: Self-pay | Admitting: Internal Medicine

## 2023-08-06 ENCOUNTER — Ambulatory Visit (HOSPITAL_BASED_OUTPATIENT_CLINIC_OR_DEPARTMENT_OTHER): Payer: 59 | Attending: Surgery | Admitting: Physical Therapy

## 2023-08-06 VITALS — BP 151/79 | HR 86 | Temp 97.6°F | Resp 16 | Wt 270.1 lb

## 2023-08-06 DIAGNOSIS — C641 Malignant neoplasm of right kidney, except renal pelvis: Secondary | ICD-10-CM

## 2023-08-06 DIAGNOSIS — M6281 Muscle weakness (generalized): Secondary | ICD-10-CM | POA: Insufficient documentation

## 2023-08-06 DIAGNOSIS — Z5112 Encounter for antineoplastic immunotherapy: Secondary | ICD-10-CM

## 2023-08-06 DIAGNOSIS — Z905 Acquired absence of kidney: Secondary | ICD-10-CM | POA: Insufficient documentation

## 2023-08-06 DIAGNOSIS — R262 Difficulty in walking, not elsewhere classified: Secondary | ICD-10-CM | POA: Diagnosis not present

## 2023-08-06 DIAGNOSIS — Z79899 Other long term (current) drug therapy: Secondary | ICD-10-CM | POA: Insufficient documentation

## 2023-08-06 LAB — CMP (CANCER CENTER ONLY)
ALT: 21 U/L (ref 0–44)
AST: 20 U/L (ref 15–41)
Albumin: 4.2 g/dL (ref 3.5–5.0)
Alkaline Phosphatase: 54 U/L (ref 38–126)
Anion gap: 9 (ref 5–15)
BUN: 17 mg/dL (ref 6–20)
CO2: 26 mmol/L (ref 22–32)
Calcium: 8.7 mg/dL — ABNORMAL LOW (ref 8.9–10.3)
Chloride: 101 mmol/L (ref 98–111)
Creatinine: 0.96 mg/dL (ref 0.44–1.00)
GFR, Estimated: >60 mL/min (ref >60)
Glucose, Bld: 355 mg/dL — ABNORMAL HIGH (ref 70–99)
Potassium: 3.9 mmol/L (ref 3.5–5.1)
Sodium: 136 mmol/L (ref 135–145)
Total Bilirubin: 0.6 mg/dL (ref 0.0–1.2)
Total Protein: 7.2 g/dL (ref 6.5–8.1)

## 2023-08-06 LAB — CBC WITH DIFFERENTIAL (CANCER CENTER ONLY)
Abs Immature Granulocytes: 0.04 10*3/uL (ref 0.00–0.07)
Basophils Absolute: 0.1 10*3/uL (ref 0.0–0.1)
Basophils Relative: 1 %
Eosinophils Absolute: 0.2 10*3/uL (ref 0.0–0.5)
Eosinophils Relative: 3 %
HCT: 41.5 % (ref 36.0–46.0)
Hemoglobin: 13.9 g/dL (ref 12.0–15.0)
Immature Granulocytes: 1 %
Lymphocytes Relative: 25 %
Lymphs Abs: 1.7 10*3/uL (ref 0.7–4.0)
MCH: 29.6 pg (ref 26.0–34.0)
MCHC: 33.5 g/dL (ref 30.0–36.0)
MCV: 88.5 fL (ref 80.0–100.0)
Monocytes Absolute: 0.5 10*3/uL (ref 0.1–1.0)
Monocytes Relative: 7 %
Neutro Abs: 4.5 10*3/uL (ref 1.7–7.7)
Neutrophils Relative %: 63 %
Platelet Count: 202 10*3/uL (ref 150–400)
RBC: 4.69 MIL/uL (ref 3.87–5.11)
RDW: 14.2 % (ref 11.5–15.5)
WBC Count: 7 10*3/uL (ref 4.0–10.5)
nRBC: 0 % (ref 0.0–0.2)

## 2023-08-06 LAB — TSH: TSH: 4.124 u[IU]/mL (ref 0.350–4.500)

## 2023-08-06 MED ORDER — SODIUM CHLORIDE 0.9 % IV SOLN
200.0000 mg | Freq: Once | INTRAVENOUS | Status: AC
  Administered 2023-08-06: 200 mg via INTRAVENOUS
  Filled 2023-08-06: qty 200

## 2023-08-06 MED ORDER — SODIUM CHLORIDE 0.9 % IV SOLN
Freq: Once | INTRAVENOUS | Status: AC
Start: 2023-08-06 — End: 2023-08-06

## 2023-08-06 NOTE — Therapy (Signed)
 OUTPATIENT PHYSICAL THERAPY EVALUATION   Patient Name: Christina Cameron MRN: [id number] DOB:02-Nov-1965, 58 y.o., female Today's Date: 08/07/2023  END OF SESSION:  PT End of Session - 08/06/23 1522     Visit Number 1    Number of Visits 16    Date for PT Re-Evaluation 10/06/23    Authorization Type [employer] dual complete    PT Start Time 1450    PT Stop Time 1528    PT Time Calculation (min) 38 min    Activity Tolerance Patient tolerated treatment well    Behavior During Therapy WFL for tasks assessed/performed             Past Medical History:  Diagnosis Date   Arm vein blood clot, unspecified laterality    around 20 years ago as of 08/10/21, Patient doesn't remember what caused it. She was not put on blood thinners.   Arthritis    right knee   Asthma    Cancer (HCC)    cervical 1989   Cancer of kidney (HCC)    Chest pain    a. normal cors by cath in 11/2014 / 03/02/2020 nuclear stress test demonstrated no perfusion defects consistent with prior infart or current ischemia. Normal study.   Chronic diastolic (congestive) heart failure (HCC)    09/27/20 Echocardiogram in Epic showed LVEF 60 to 65%.   Complication of anesthesia    post-operative nausea & vomiting   COVID-19    05/29/19 & 05/2020 Covid infections   Depression    Diabetes mellitus, type 2 (HCC)    Dysrhythmia    heart palpitations   GERD (gastroesophageal reflux disease)    H/O cardiovascular stress test 03/02/2020   03/02/2020 Nuclear stress test in Epic showed no perusion defects consistent with prior infarct or ischemia - normal study.   Headache    migraines   High cholesterol    pt takes Crestor   History of cardiac radiofrequency ablation    Hypertension    Last cardiology office visit, 10/22/20 with Rennis Harding, NP ( see note in Epic) as of 08/09/21.   Morbid (severe) obesity due to excess calories (HCC)    Neuromuscular disorder (HCC)    severe neuropathy in feet   Osteoarthritis of knee, unspecified     Palpitations    PONV (postoperative nausea and vomiting)    Post-COVID syndrome 05/2019   Pt experienced chest tightness, sob, palpitations & dizziness after Covid infection. See 04/2020 note from Rennis Harding, NP in [place].   PTSD (post-traumatic stress disorder)    Raynaud's syndrome    Sleep apnea 09/24/2020   sleep study indicated moderate sleep apea   SVT (supraventricular tachycardia) (HCC)    a. s/p ablation by Dr. Ladona Ridgel in 2006.   Wears glasses    prescripton reading glasses   Past Surgical History:  Procedure Laterality Date   CARDIAC CATHETERIZATION N/A 12/14/2014   Procedure: Right/Left Heart Cath and Coronary Angiography;  Surgeon: Tonny Bollman, MD;  Location[facility] INVASIVE CV LAB;  Service: Cardiovascular;  Laterality: N/A;   CARDIAC ELECTROPHYSIOLOGY STUDY AND ABLATION     around 2006, Patient states that heart rate was up to 180 bpm.   CESAREAN SECTION     x2 1990, 2005   CYSTOSCOPY  08/17/2021   Procedure: CYSTOSCOPY;  Surgeon: Carrington Clamp, MD;  Location: [facility];  Service: Gynecology;;   ENDOMETRIAL ABLATION     HYSTEROSCOPY WITH D & C N/A 03/11/2021   Procedure: DILATATION AND CURETTAGE /  HYSTEROSCOPY;  Surgeon: Marlow Baars, MD;  Location: [facility] OR;  Service: Gynecology;  Laterality: N/A;   OPERATIVE ULTRASOUND N/A 03/11/2021   Procedure: OPERATIVE ULTRASOUND;  Surgeon: Marlow Baars, MD;  Location: [facility] OR;  Service: Gynecology;  Laterality: N/A;  ultrasound guidance needed   ROBOT ASSISTED LAPAROSCOPIC NEPHRECTOMY Right 12/25/2022   Procedure: XI ROBOTIC ASSISTED LAPAROSCOPIC NEPHRECTOMY;  Surgeon: Malen Gauze, MD;  Location: [facility] ORS;  Service: Urology;  Laterality: Right;   ROBOTIC ASSISTED LAPAROSCOPIC HYSTERECTOMY AND SALPINGECTOMY Bilateral 08/17/2021   Procedure: XI ROBOTIC ASSISTED LAPAROSCOPIC HYSTERECTOMY AND  BILATERAL SALPINGECTOMY AND OOPHERETOMY;  Surgeon: Carrington Clamp, MD;  Location: [facility];  Service:  Gynecology;  Laterality: Bilateral;   Patient Active Problem List   Diagnosis Date Noted   Encounter for antineoplastic immunotherapy 04/11/2023   Primary renal cell carcinoma of right kidney (HCC) 01/30/2023   History of right radical nephrectomy 12/28/2022   Right renal mass 12/25/2022   Cardiac chest pain 05/01/2022   Moderate obstructive sleep apnea 01/23/2022   Heart failure with preserved ejection fraction (HCC) 09/19/2021   Abnormal vaginal bleeding in postmenopausal patient 08/17/2021   Paroxysmal SVT (supraventricular tachycardia) (HCC) 09/17/2019   Upper airway cough syndrome vs atypical asthma/ cough variant  09/17/2019   Anxiety 09/15/2019   Essential hypertension, benign 05/07/2019   Chronic diastolic (congestive) heart failure (HCC)    HLD (hyperlipidemia)    Morbid (severe) obesity due to excess calories (HCC)    Osteoarthritis of knee, unspecified    Palpitations    Somnolence    Type 2 diabetes mellitus with hyperglycemia (HCC)    Raynaud's syndrome    DOE (dyspnea on exertion)    Precordial chest pain 09/08/2014     REFERRING PROVIDER:  Quentin Ore, MD     REFERRING DIAG:  K43.2 (ICD-10-CM) - Hernia, incisional  E66.01 (ICD-10-CM) - Morbid obesity (HCC)  Z68.41 (ICD-10-CM) - Body mass index 40.0-44.9, adult (HCC)  Weight loss prior to hernia surgery  Rationale for Evaluation and Treatment: Rehabilitation  THERAPY DIAG:  Muscle weakness (generalized)  Difficulty in walking, not elsewhere classified  ONSET DATE: kidney removed July 2024 and then hernia developed quickly.    SUBJECTIVE:                                                                                                                                                                                           SUBJECTIVE STATEMENT: Sees MD again May 7, hoping for surgery soon after that. I can't get up, I am so sore, I feel off balance. Bilat bone on  bone knees.   PERTINENT  HISTORY:  Currently in tx for CA  PAIN:  Are you having pain? Yes: NPRS scale: moderate Pain location: across lower back, bilateral knees Pain description: pressure, affects bowels, hips are sore Aggravating factors: standing/walking Relieving factors: rest  PRECAUTIONS:  None  RED FLAGS: None   WEIGHT BEARING RESTRICTIONS:  No  FALLS:  Has patient fallen in last 6 months? No  LIVING ENVIRONMENT: Alone but daughter is local- student at [employer]  OCCUPATION:  On disability  PLOF:  Independent  PATIENT GOALS:  Prep for surgery    OBJECTIVE:  Note: Objective measures were completed at Evaluation unless otherwise noted.  PATIENT SURVEYS:  Patient-Specific Activity Scoring Scheme  "0" represents "unable to perform." "10" represents "able to perform at prior level. 0 1 2 3 4 5 6 7 8 9  10 (Date and Score)   Activity Eval 3/10    1. Getting out of bed  4    2. Getting out of tub  3    3. Walking for ADLs 5   4.    5.    Score 12    Total score = sum of the activity scores/number of activities Minimum detectable change (90%CI) for average score = 2 points Minimum detectable change (90%CI) for single activity score = 3 points     COGNITIVE STATUS: Within functional limits for tasks assessed   SENSATION: Bilateral neuropathy- numb in toes and hand, tingling and burning in feet  EDEMA:  Yes: pt reports bilat LE edema  POSTURE:  Hernia displaced to right lower abdominal quadrant  HAND DOMINANCE:  Right  GAIT: Comments: Rt trendelenburg, Rt LE ER   OBJECTIVE: 3/11: unable to stand from chair without bilateral UE  Trunk sidebend in gait, antalgic on right                                                                                                                              TREATMENT DATE:   EVAL 3/10: Discussed aquatic exercise and transition to land based exercises Discussed need for functional strength & post op expectations Glut  sets Standing hip flexor & gastroc stretches Seated hamstring set   PATIENT EDUCATION:  Education details: Anatomy of condition, POC, HEP, exercise form/rationale  Person educated: Patient Education method: Explanation, Demonstration, Tactile cues, Verbal cues, and Handouts Education comprehension: verbalized understanding, returned demonstration, verbal cues required, tactile cues required, and needs further education  HOME EXERCISE PROGRAM: [id number]   ASSESSMENT:  CLINICAL IMPRESSION: Patient is a 58 y.o. F who was seen today for physical therapy evaluation and treatment for prehab prior to surgical intervention for incisional hernia in right lower abdominal quadrant. Patient has significant discomfort in knees and hips with minimal ability to engage abdominal wall. Pt will benefit from skilled, aquatic therapy to reduce burden of gravity and uneven distribution of weight to improve functional strength in preparation for surgery.     REHAB POTENTIAL: Fair prehab  CLINICAL DECISION MAKING: Stable/uncomplicated  EVALUATION  COMPLEXITY: Low   GOALS: Goals reviewed with patient? Yes  SHORT TERM GOALS: Target date: 08/25/23  Verbalize ability to utilize glut set throughout the day Baseline: Goal status: INITIAL  2.  Good form presented for stretches Baseline:  Goal status: INITIAL  3.  Find a good working level in aquatic exercises Baseline:  Goal status: INITIAL   LONG TERM GOALS: Target date: POC date  Verbalize readiness for surgery regarding MSK strength Baseline:  Goal status: INITIAL  2.  PSFS to improve by MCID Baseline:  Goal status: INITIAL  3.  Minimal use of UEs for standing from a chair.  Baseline:  Goal status: INITIAL     PLAN:  PT FREQUENCY: 1-2x/week  PT DURATION: POC date  PLANNED INTERVENTIONS: 97164- PT Re-evaluation, 97110-Therapeutic exercises, 97530- Therapeutic activity, 97112- Neuromuscular re-education, 97535- Self Care,  97140- Manual therapy, [phone]- Gait training, 60454- Aquatic Therapy, Patient/Family education, Balance training, Stair training, Taping, Joint mobilization, Spinal mobilization, Scar mobilization, Cryotherapy, and Moist heat.  PLAN FOR NEXT SESSION: ACTIVE CA*, aquatics   [place] C. Katelynn Heidler PT, DPT 08/07/23 12:57 PM

## 2023-08-06 NOTE — Patient Instructions (Signed)
 [facility] - A DEPT OF [name]  Discharge Instructions: Thank you for choosing [name] to provide your oncology and hematology care.   If you have a lab appointment with the Cancer Center, please go directly to the Cancer Center and check in at the registration area.   Wear comfortable clothing and clothing appropriate for easy access to any Portacath or PICC line.   We strive to give you quality time with your provider. You may need to reschedule your appointment if you arrive late (15 or more minutes).  Arriving late affects you and other patients whose appointments are after yours.  Also, if you miss three or more appointments without notifying the office, you may be dismissed from the clinic at the provider's discretion.      For prescription refill requests, have your pharmacy contact our office and allow 72 hours for refills to be completed.    Today you received the following chemotherapy and/or immunotherapy agents Christina Cameron      To help prevent nausea and vomiting after your treatment, we encourage you to take your nausea medication as directed.  BELOW ARE SYMPTOMS THAT SHOULD BE REPORTED IMMEDIATELY: *FEVER GREATER THAN 100.4 F (38 C) OR HIGHER *CHILLS OR SWEATING *NAUSEA AND VOMITING THAT IS NOT CONTROLLED WITH YOUR NAUSEA MEDICATION *UNUSUAL SHORTNESS OF BREATH *UNUSUAL BRUISING OR BLEEDING *URINARY PROBLEMS (pain or burning when urinating, or frequent urination) *BOWEL PROBLEMS (unusual diarrhea, constipation, pain near the anus) TENDERNESS IN MOUTH AND THROAT WITH OR WITHOUT PRESENCE OF ULCERS (sore throat, sores in mouth, or a toothache) UNUSUAL RASH, SWELLING OR PAIN  UNUSUAL VAGINAL DISCHARGE OR ITCHING   Items with * indicate a potential emergency and should be followed up as soon as possible or go to the Emergency Department if any problems should occur.  Please show the CHEMOTHERAPY ALERT CARD or IMMUNOTHERAPY  ALERT CARD at check-in to the Emergency Department and triage nurse.  Should you have questions after your visit or need to cancel or reschedule your appointment, please contact [facility] - A DEPT OF [facility]  Dept: [phone]  and follow the prompts.  Office hours are 8:00 a.m. to 4:30 p.m. Monday - Friday. Please note that voicemails left after 4:00 p.m. may not be returned until the following business day.  We are closed weekends and major holidays. You have access to a nurse at all times for urgent questions. Please call the main number to the clinic Dept: [phone] and follow the prompts.   For any non-urgent questions, you may also contact your provider using MyChart. We now offer e-Visits for anyone 85 and older to request care online for non-urgent symptoms. For details visit [email or web address].   Also download the MyChart app! Go to the app store, search "MyChart", open the app, select , and log in with your MyChart username and password.

## 2023-08-07 ENCOUNTER — Other Ambulatory Visit: Payer: Self-pay

## 2023-08-07 ENCOUNTER — Encounter (HOSPITAL_BASED_OUTPATIENT_CLINIC_OR_DEPARTMENT_OTHER): Payer: Self-pay | Admitting: Physical Therapy

## 2023-08-07 LAB — T4: T4, Total: 7.7 ug/dL (ref 4.5–12.0)

## 2023-08-08 ENCOUNTER — Encounter: Payer: Self-pay | Admitting: Urology

## 2023-08-08 ENCOUNTER — Telehealth: Payer: Self-pay

## 2023-08-08 ENCOUNTER — Other Ambulatory Visit: Payer: Self-pay

## 2023-08-08 NOTE — Telephone Encounter (Signed)
See patient message below and advise

## 2023-08-08 NOTE — Telephone Encounter (Signed)
 Dysuria  Patient called with c/o dysuria x  6am.  Pain: constant  Severity:3/10  Associated Signs and Symptoms:  Fever: yes Temp.99 Chills: no Hematuria: no Urgency: yes Frequency: no Hesitancy:yes Incontinence: no Nausea: yes Vomiting: no  Patients states just took [facility]   Urologic History:  Any Recent Urologic Surgeries or Procedures:no Recurrent UTI's:yes Cystitis: unknown  Prostatitis:female Kidney or Bladder Stones: no Plan: Walk-in Clinic: urine drop off Appointment w/Physician: [no Lab visit scheduled for urine drop off: Yes Advice given: MD McKenzie states if Pt gets dehydrated Greggory Keen can cause dehydration) dehydration can mess up kidney function drink plenty of fluids   Do you take on daily medications for UTI suppression No

## 2023-08-09 ENCOUNTER — Other Ambulatory Visit

## 2023-08-09 ENCOUNTER — Other Ambulatory Visit: Payer: Self-pay

## 2023-08-09 DIAGNOSIS — N390 Urinary tract infection, site not specified: Secondary | ICD-10-CM | POA: Diagnosis not present

## 2023-08-09 LAB — URINALYSIS, ROUTINE W REFLEX MICROSCOPIC
Bilirubin, UA: NEGATIVE
Glucose, UA: NEGATIVE
Ketones, UA: NEGATIVE
Leukocytes,UA: NEGATIVE
Nitrite, UA: NEGATIVE
Protein,UA: NEGATIVE
RBC, UA: NEGATIVE
Specific Gravity, UA: 1.01 (ref 1.005–1.030)
Urobilinogen, Ur: 0.2 mg/dL (ref 0.2–1.0)
pH, UA: 6 (ref 5.0–7.5)

## 2023-08-10 ENCOUNTER — Other Ambulatory Visit: Payer: Self-pay

## 2023-08-10 DIAGNOSIS — N390 Urinary tract infection, site not specified: Secondary | ICD-10-CM

## 2023-08-12 LAB — URINE CULTURE

## 2023-08-13 ENCOUNTER — Other Ambulatory Visit: Payer: Self-pay

## 2023-08-14 DIAGNOSIS — Z79899 Other long term (current) drug therapy: Secondary | ICD-10-CM | POA: Diagnosis not present

## 2023-08-14 DIAGNOSIS — G4700 Insomnia, unspecified: Secondary | ICD-10-CM | POA: Diagnosis not present

## 2023-08-15 ENCOUNTER — Telehealth: Admitting: Physician Assistant

## 2023-08-15 DIAGNOSIS — M5441 Lumbago with sciatica, right side: Secondary | ICD-10-CM

## 2023-08-15 MED ORDER — TIZANIDINE HCL 2 MG PO CAPS: 2.0000 mg | ORAL_CAPSULE | Freq: Three times a day (TID) | ORAL | 0 refills | Status: DC

## 2023-08-15 MED ORDER — METHYLPREDNISOLONE 4 MG PO TBPK: ORAL_TABLET | ORAL | 0 refills | Status: DC

## 2023-08-15 NOTE — Progress Notes (Signed)
 E-Visit for Back Pain   We are sorry that you are not feeling well.  Here is how we plan to help!  Based on what you have shared with me it looks like you mostly have acute back pain.  Acute back pain is defined as musculoskeletal pain that can resolve in 1-3 weeks with conservative treatment.  I have prescribed Medrol Dosepak 4mg  an anti-inflammatory as well as Tizanidine 2 mg every eight hours as needed which is a muscle relaxer  Some patients experience stomach irritation or in increased heartburn with anti-inflammatory drugs.  Please keep in mind that muscle relaxer's can cause fatigue and should not be taken while at work or driving.  Back pain is very common.  The pain often gets better over time.  The cause of back pain is usually not dangerous.  Most people can learn to manage their back pain on their own.  Home Care Stay active.  Start with short walks on flat ground if you can.  Try to walk farther each day. Do not sit, drive or stand in one place for more than 30 minutes.  Do not stay in bed. Do not avoid exercise or work.  Activity can help your back heal faster. Be careful when you bend or lift an object.  Bend at your knees, keep the object close to you, and do not twist. Sleep on a firm mattress.  Lie on your side, and bend your knees.  If you lie on your back, put a pillow under your knees. Only take medicines as told by your doctor. Put ice on the injured area. Put ice in a plastic bag Place a towel between your skin and the bag Leave the ice on for 15-20 minutes, 3-4 times a day for the first 2-3 days. 210 After that, you can switch between ice and heat packs. Ask your doctor about back exercises or massage. Avoid feeling anxious or stressed.  Find good ways to deal with stress, such as exercise.  Get Help Right Way If: Your pain does not go away with rest or medicine. Your pain does not go away in 1 week. You have new problems. You do not feel well. The pain spreads  into your legs. You cannot control when you poop (bowel movement) or pee (urinate) You feel sick to your stomach (nauseous) or throw up (vomit) You have belly (abdominal) pain. You feel like you may pass out (faint). If you develop a fever.  Make Sure you: Understand these instructions. Will watch your condition Will get help right away if you are not doing well or get worse.  Your e-visit answers were reviewed by a board certified advanced clinical practitioner to complete your personal care plan.  Depending on the condition, your plan could have included both over the counter or prescription medications.  If there is a problem please reply  once you have received a response from your provider.  Your safety is important to [place].  If you have drug allergies check your prescription carefully.    You can use MyChart to ask questions about today's visit, request a non-urgent call back, or ask for a work or school excuse for 24 hours related to this e-Visit. If it has been greater than 24 hours you will need to follow up with your provider, or enter a new e-Visit to address those concerns.  You will get an e-mail in the next two days asking about your experience.  I hope that your e-visit  has been valuable and will speed your recovery. Thank you for using e-visits.   I have spent 5 minutes in review of e-visit questionnaire, review and updating patient chart, medical decision making and response to patient.   Gilberto Better, PA-C

## 2023-08-16 ENCOUNTER — Other Ambulatory Visit: Payer: Self-pay | Admitting: Physician Assistant

## 2023-08-16 ENCOUNTER — Encounter: Payer: Self-pay | Admitting: Internal Medicine

## 2023-08-16 ENCOUNTER — Encounter: Payer: Self-pay | Admitting: Physician Assistant

## 2023-08-16 DIAGNOSIS — M5441 Lumbago with sciatica, right side: Secondary | ICD-10-CM

## 2023-08-16 MED ORDER — CYCLOBENZAPRINE HCL 5 MG PO TABS: 5.0000 mg | ORAL_TABLET | Freq: Three times a day (TID) | ORAL | 0 refills | Status: AC | PRN

## 2023-08-16 NOTE — Progress Notes (Signed)
 Tizanidine Rx requires prior authorization. Rx for another muscle relaxer, Cyclobenzaprine 5mg ,  sent to preferred pharmacy.

## 2023-08-20 ENCOUNTER — Other Ambulatory Visit: Payer: 59

## 2023-08-20 ENCOUNTER — Ambulatory Visit: Payer: 59 | Admitting: Internal Medicine

## 2023-08-20 ENCOUNTER — Ambulatory Visit: Payer: 59

## 2023-08-21 ENCOUNTER — Encounter (HOSPITAL_BASED_OUTPATIENT_CLINIC_OR_DEPARTMENT_OTHER): Payer: Self-pay | Admitting: Physical Therapy

## 2023-08-21 ENCOUNTER — Ambulatory Visit (HOSPITAL_BASED_OUTPATIENT_CLINIC_OR_DEPARTMENT_OTHER): Admitting: Physical Therapy

## 2023-08-23 ENCOUNTER — Ambulatory Visit (HOSPITAL_BASED_OUTPATIENT_CLINIC_OR_DEPARTMENT_OTHER): Admitting: Physical Therapy

## 2023-08-27 ENCOUNTER — Inpatient Hospital Stay (HOSPITAL_BASED_OUTPATIENT_CLINIC_OR_DEPARTMENT_OTHER): Payer: 59 | Admitting: Internal Medicine

## 2023-08-27 ENCOUNTER — Ambulatory Visit: Payer: 59 | Admitting: Orthopedic Surgery

## 2023-08-27 ENCOUNTER — Ambulatory Visit: Payer: 59 | Admitting: Podiatry

## 2023-08-27 ENCOUNTER — Inpatient Hospital Stay: Payer: 59

## 2023-08-27 VITALS — BP 118/73 | HR 75 | Temp 98.0°F | Resp 16 | Ht 64.0 in | Wt 266.0 lb

## 2023-08-27 DIAGNOSIS — M6281 Muscle weakness (generalized): Secondary | ICD-10-CM | POA: Diagnosis not present

## 2023-08-27 DIAGNOSIS — C641 Malignant neoplasm of right kidney, except renal pelvis: Secondary | ICD-10-CM

## 2023-08-27 DIAGNOSIS — R262 Difficulty in walking, not elsewhere classified: Secondary | ICD-10-CM | POA: Diagnosis not present

## 2023-08-27 LAB — TSH: TSH: 2.985 u[IU]/mL (ref 0.350–4.500)

## 2023-08-27 LAB — CBC WITH DIFFERENTIAL (CANCER CENTER ONLY)
Abs Immature Granulocytes: 0.04 10*3/uL (ref 0.00–0.07)
Basophils Absolute: 0.1 10*3/uL (ref 0.0–0.1)
Basophils Relative: 1 %
Eosinophils Absolute: 0.2 10*3/uL (ref 0.0–0.5)
Eosinophils Relative: 3 %
HCT: 40.9 % (ref 36.0–46.0)
Hemoglobin: 13.6 g/dL (ref 12.0–15.0)
Immature Granulocytes: 1 %
Lymphocytes Relative: 28 %
Lymphs Abs: 2.2 10*3/uL (ref 0.7–4.0)
MCH: 29.7 pg (ref 26.0–34.0)
MCHC: 33.3 g/dL (ref 30.0–36.0)
MCV: 89.3 fL (ref 80.0–100.0)
Monocytes Absolute: 0.6 10*3/uL (ref 0.1–1.0)
Monocytes Relative: 7 %
Neutro Abs: 5 10*3/uL (ref 1.7–7.7)
Neutrophils Relative %: 60 %
Platelet Count: 208 10*3/uL (ref 150–400)
RBC: 4.58 MIL/uL (ref 3.87–5.11)
RDW: 13.5 % (ref 11.5–15.5)
WBC Count: 8.1 10*3/uL (ref 4.0–10.5)
nRBC: 0 % (ref 0.0–0.2)

## 2023-08-27 LAB — CMP (CANCER CENTER ONLY)
ALT: 21 U/L (ref 0–44)
AST: 19 U/L (ref 15–41)
Albumin: 4.3 g/dL (ref 3.5–5.0)
Alkaline Phosphatase: 48 U/L (ref 38–126)
Anion gap: 6 (ref 5–15)
BUN: 19 mg/dL (ref 6–20)
CO2: 29 mmol/L (ref 22–32)
Calcium: 9.1 mg/dL (ref 8.9–10.3)
Chloride: 102 mmol/L (ref 98–111)
Creatinine: 0.97 mg/dL (ref 0.44–1.00)
GFR, Estimated: >60 mL/min (ref >60)
Glucose, Bld: 186 mg/dL — ABNORMAL HIGH (ref 70–99)
Potassium: 4.6 mmol/L (ref 3.5–5.1)
Sodium: 137 mmol/L (ref 135–145)
Total Bilirubin: 0.5 mg/dL (ref 0.0–1.2)
Total Protein: 7.1 g/dL (ref 6.5–8.1)

## 2023-08-27 MED ORDER — SODIUM CHLORIDE 0.9 % IV SOLN: Freq: Once | INTRAVENOUS | Status: AC

## 2023-08-27 MED ORDER — PEMBROLIZUMAB CHEMO INJECTION 100 MG/4ML
200.0000 mg | Freq: Once | INTRAVENOUS | Status: AC
  Administered 2023-08-27: 200 mg via INTRAVENOUS
  Filled 2023-08-27: qty 200

## 2023-08-27 NOTE — Patient Instructions (Signed)

## 2023-08-27 NOTE — Progress Notes (Signed)
 [facility] Telephone:([phone]) [phone]   [phone]) [phone]  OFFICE PROGRESS NOTE  Christina Moris, Christina Cameron [address] [place] [address]  DIAGNOSIS:  Stage III (T3a, N0, M0) clear-cell renal cell carcinoma of the right kidney diagnosed in July 2024  PRIOR THERAPY:  status post robotic assisted right radical nephrectomy and the final pathology was consistent with clear-cell renal cell carcinoma with focal rhabdoid features, nuclear grade 4 with tumor size of 10.0 cm and tumor extends into the renal sinus fat and renal vein (pT3a).  The ureteral, vascular and all margins of resection were negative for tumor.   CURRENT THERAPY: Adjuvant treatment with immunotherapy with Keytruda 200 Mg IV every 3 weeks.  First dose was given on 02/13/2023.  Status post 8 cycles.  INTERVAL HISTORY: Christina Cameron 58 y.o. female returns to the clinic today for follow-up visit.Discussed the use of AI scribe software for clinical note transcription with the patient, who gave verbal consent to proceed.  History of Present Illness   Christina Cameron is a 58 year old female who presents for evaluation before starting cycle number nine of immunotherapy.  She is undergoing immunotherapy for stage III (T3a, N0, M0) clear-cell renal cell carcinoma of the right kidney diagnosed in July 2024and is preparing for her ninth cycle. There have been no changes in her medication regimen. She experiences occasional nausea. No diarrhea, rash, itching, weight loss, or night sweats.  She feels a little unsteady on her feet and experienced a fall last week, impacting her head and back. She attributes the fall to losing her balance and describes it as a one-time occurrence.  She is scheduled for a CT scan of the abdomen and pelvis tomorrow.         MEDICAL HISTORY: Past Medical History:  Diagnosis Date   Arm vein blood clot, unspecified laterality    around 20 years ago as of 08/10/21, Patient doesn't remember what caused  it. She was not put on blood thinners.   Arthritis    right knee   Asthma    Cancer (HCC)    cervical 1989   Cancer of kidney (HCC)    Chest pain    a. normal cors by cath in 11/2014 / 03/02/2020 nuclear stress test demonstrated no perfusion defects consistent with prior infart or current ischemia. Normal study.   Chronic diastolic (congestive) heart failure (HCC)    09/27/20 Echocardiogram in Epic showed LVEF 60 to 65%.   Complication of anesthesia    post-operative nausea & vomiting   COVID-19    05/29/19 & 05/2020 Covid infections   Depression    Diabetes mellitus, type 2 (HCC)    Dysrhythmia    heart palpitations   GERD (gastroesophageal reflux disease)    H/O cardiovascular stress test 03/02/2020   03/02/2020 Nuclear stress test in Epic showed no perusion defects consistent with prior infarct or ischemia - normal study.   Headache    migraines   High cholesterol    pt takes Crestor   History of cardiac radiofrequency ablation    Hypertension    Last cardiology office visit, 10/22/20 with Rennis Harding, NP ( see note in Epic) as of 08/09/21.   Morbid (severe) obesity due to excess calories (HCC)    Neuromuscular disorder (HCC)    severe neuropathy in feet   Osteoarthritis of knee, unspecified    Palpitations    PONV (postoperative nausea and vomiting)    Post-COVID syndrome  05/2019   Pt experienced chest tightness, sob, palpitations & dizziness after Covid infection. See 04/2020 note from Rennis Harding, NP in [place].   PTSD (post-traumatic stress disorder)    Raynaud's syndrome    Sleep apnea 09/24/2020   sleep study indicated moderate sleep apea   SVT (supraventricular tachycardia) (HCC)    a. s/p ablation by Dr. Ladona Ridgel in 2006.   Wears glasses    prescripton reading glasses    ALLERGIES:  has no known allergies.  MEDICATIONS:  Current Outpatient Medications  Medication Sig Dispense Refill   albuterol (VENTOLIN HFA) 108 (90 Base) MCG/ACT inhaler Inhale 1-2 puffs into the  lungs every 6 (six) hours as needed for wheezing or shortness of breath. 18 g 0   Azelastine-Fluticasone 137-50 MCG/ACT SUSP Place 1 spray into the nose every 12 (twelve) hours. (Patient not taking: Reported on 01/26/2023) 23 g 0   brexpiprazole (REXULTI) 2 MG TABS tablet Take 2 mg by mouth daily.     buPROPion (WELLBUTRIN XL) 150 MG 24 hr tablet Take 150 mg by mouth every morning.     busPIRone (BUSPAR) 10 MG tablet Take 10 mg by mouth 2 (two) times daily.     cephALEXin (KEFLEX) 500 MG capsule Take 1 capsule (500 mg total) by mouth 2 (two) times daily. 14 capsule 0   cetirizine (ZYRTEC) 10 MG tablet Take 10 mg by mouth daily as needed for allergies. (Patient not taking: Reported on 01/01/2023)     cyclobenzaprine (FLEXERIL) 5 MG tablet Take 1 tablet (5 mg total) by mouth 3 (three) times daily as needed for muscle spasms. Do not drive or operate a heavy machinery after taking this medicine. 20 tablet 0   estradiol (ESTRACE) 0.5 MG tablet Take 0.5 mg by mouth daily.     ezetimibe (ZETIA) 10 MG tablet Take 1 tablet (10 mg total) by mouth daily. 90 tablet 3   furosemide (LASIX) 20 MG tablet Take 1 tablet (20 mg total) by mouth daily. (Patient taking differently: Take 20 mg by mouth daily as needed for fluid or edema.) 90 tablet 3   gabapentin (NEURONTIN) 300 MG capsule Take 1 capsule (300 mg total) by mouth 3 (three) times daily. Start at bedtime and slowly increase to 3x a day as needed to make sure no side effects like sedation. (Patient taking differently: Take 300 mg by mouth at bedtime. Start at bedtime and slowly increase to 3x a day as needed to make sure no side effects like sedation.) 90 capsule 11   hydrOXYzine (ATARAX) 50 MG tablet Take 100 mg by mouth at bedtime.     indapamide (LOZOL) 2.5 MG tablet Take 2.5 mg by mouth daily as needed (fluid).     isosorbide mononitrate (IMDUR) 30 MG 24 hr tablet Take 1 tablet (30 mg total) by mouth daily. (Patient not taking: Reported on 01/01/2023) 90 tablet 3    lamoTRIgine (LAMICTAL) 200 MG tablet Take 200 mg by mouth daily.     metFORMIN (GLUCOPHAGE) 500 MG tablet Take 1,000 mg by mouth 2 (two) times daily with a meal.     methylPREDNISolone (MEDROL DOSEPAK) 4 MG TBPK tablet Take as directed 1 each 0   metoprolol tartrate (LOPRESSOR) 50 MG tablet Take 2 tablets (100 mg total) by mouth 2 (two) times daily. 180 tablet 3   mupirocin ointment (BACTROBAN) 2 % Apply 1 Application topically 2 (two) times daily. 30 g 2   mupirocin ointment (BACTROBAN) 2 % Apply 1 Application topically 2 (two) times  daily. 22 g 0   nitroGLYCERIN (NITROSTAT) 0.4 MG SL tablet DISSOLVE ONE TABLET UNDER THE TONGUE EVERY 5 MINUTES AS NEEDED FOR CHEST PAIN.  DO NOT EXCEED A TOTAL OF 3 DOSES IN 15 MINUTES 75 tablet 0   oxyCODONE-acetaminophen (PERCOCET) 5-325 MG tablet Take 1 tablet by mouth every 4 (four) hours as needed for severe pain (pain score 7-10). 30 tablet 0   prochlorperazine (COMPAZINE) 10 MG tablet Take 1 tablet (10 mg total) by mouth every 6 (six) hours as needed for nausea or vomiting. 30 tablet 1   rosuvastatin (CRESTOR) 40 MG tablet Take 1 tablet (40 mg total) by mouth daily. 90 tablet 3   Semaglutide, 2 MG/DOSE, (OZEMPIC, 2 MG/DOSE,) 8 MG/3ML SOPN Inject 2 mg into the skin every 7 (seven) days.     sertraline (ZOLOFT) 100 MG tablet Take 200 mg by mouth daily.     tizanidine (ZANAFLEX) 2 MG capsule Take 1 capsule (2 mg total) by mouth 3 (three) times daily. 15 capsule 0   No current facility-administered medications for this visit.    SURGICAL HISTORY:  Past Surgical History:  Procedure Laterality Date   CARDIAC CATHETERIZATION N/A 12/14/2014   Procedure: Right/Left Heart Cath and Coronary Angiography;  Surgeon: Tonny Bollman, MD;  Location: [facility] INVASIVE CV LAB;  Service: Cardiovascular;  Laterality: N/A;   CARDIAC ELECTROPHYSIOLOGY STUDY AND ABLATION     around 2006, Patient states that heart rate was up to 180 bpm.   CESAREAN SECTION     x2 1990, 2005    CYSTOSCOPY  08/17/2021   Procedure: CYSTOSCOPY;  Surgeon: Carrington Clamp, MD;  Location: [facility];  Service: Gynecology;;   ENDOMETRIAL ABLATION     HYSTEROSCOPY WITH D & C N/A 03/11/2021   Procedure: DILATATION AND CURETTAGE /HYSTEROSCOPY;  Surgeon: Marlow Baars, MD;  Location: [facility] OR;  Service: Gynecology;  Laterality: N/A;   OPERATIVE ULTRASOUND N/A 03/11/2021   Procedure: OPERATIVE ULTRASOUND;  Surgeon: Marlow Baars, MD;  Location: [facility] OR;  Service: Gynecology;  Laterality: N/A;  ultrasound guidance needed   ROBOT ASSISTED LAPAROSCOPIC NEPHRECTOMY Right 12/25/2022   Procedure: XI ROBOTIC ASSISTED LAPAROSCOPIC NEPHRECTOMY;  Surgeon: Malen Gauze, MD;  Location: [facility];  Service: Urology;  Laterality: Right;   ROBOTIC ASSISTED LAPAROSCOPIC HYSTERECTOMY AND SALPINGECTOMY Bilateral 08/17/2021   Procedure: XI ROBOTIC ASSISTED LAPAROSCOPIC HYSTERECTOMY AND  BILATERAL SALPINGECTOMY AND OOPHERETOMY;  Surgeon: Carrington Clamp, MD;  Location: [facility];  Service: Gynecology;  Laterality: Bilateral;    REVIEW OF SYSTEMS:  A comprehensive review of systems was negative.   PHYSICAL EXAMINATION: General appearance: alert, cooperative, fatigued, and no distress Head: Normocephalic, without obvious abnormality, atraumatic Neck: no adenopathy, no JVD, supple, symmetrical, trachea midline, and thyroid not enlarged, symmetric, no tenderness/mass/nodules Lymph nodes: Cervical, supraclavicular, and axillary nodes normal. Resp: clear to auscultation bilaterally Back: symmetric, no curvature. ROM normal. No CVA tenderness. Cardio: regular rate and rhythm, S1, S2 normal, no murmur, click, rub or gallop GI: soft, non-tender; bowel sounds normal; no masses,  no organomegaly Extremities: extremities normal, atraumatic, no cyanosis or edema  ECOG PERFORMANCE STATUS: 1 - Symptomatic but completely ambulatory  Blood pressure 118/73, pulse 75, temperature 98 F (36.7 C),  temperature source Temporal, resp. rate 16, height 5\' 4"  (1.626 m), weight 266 lb (120.7 kg), SpO2 100%.  LABORATORY DATA: Lab Results  Component Value Date   WBC 8.1 08/27/2023   HGB 13.6 08/27/2023   HCT 40.9 08/27/2023   MCV 89.3 08/27/2023  PLT 208 08/27/2023      Chemistry      Component Value Date/Time   NA 136 08/06/2023 1058   NA 137 06/08/2023 1049   NA 140 10/19/2011 0919   K 3.9 08/06/2023 1058   K 4.1 10/19/2011 0919   CL 101 08/06/2023 1058   CL 104 10/19/2011 0919   CO2 26 08/06/2023 1058   CO2 24 10/19/2011 0919   BUN 17 08/06/2023 1058   BUN 21 06/08/2023 1049   BUN 16 10/19/2011 0919   CREATININE 0.96 08/06/2023 1058   CREATININE 0.63 10/19/2011 0919   GLU 297 04/15/2019 0000      Component Value Date/Time   CALCIUM 8.7 (L) 08/06/2023 1058   CALCIUM 8.5 10/19/2011 0919   ALKPHOS 54 08/06/2023 1058   ALKPHOS 57 10/19/2011 0919   AST 20 08/06/2023 1058   ALT 21 08/06/2023 1058   ALT 24 10/19/2011 0919   BILITOT 0.6 08/06/2023 1058       RADIOGRAPHIC STUDIES: No results found.   ASSESSMENT AND PLAN: This is a very pleasant 58 years old white female recently diagnosed with a stage III (T3a, N0, M0) clear-cell renal cell carcinoma of the right kidney diagnosed in July 2024 status post robotic assisted right radical nephrectomy and the final pathology was consistent with clear-cell renal cell carcinoma with focal rhabdoid features, nuclear grade 4 with tumor size of 10.0 cm and tumor extends into the renal sinus fat and renal vein (pT3a).  The ureteral, vascular and all margins of resection were negative for tumor.  She is currently undergoing adjuvant treatment with immunotherapy with Keytruda 200 Mg IV every 3 weeks.  She started the first cycle of her treatment on 02/13/2023.  Status post 8 cycles.  The patient has been tolerating this treatment fairly well with no concerning adverse effects.    Stage III (T3a, N0, M0) clear-cell renal cell carcinoma  of the right kidney diagnosed in July 2024 Cancer treatment evaluation She is post eight cycles of immunotherapy and is here for evaluation before starting cycle nine. She reports handling the immunotherapy well with occasional nausea but no diarrhea, rash, or itching. No weight loss or night sweats reported. She experienced a fall last week due to unsteadiness but no recurrent falls since then. - Proceed with cycle nine of immunotherapy today - Review results of scheduled CT of the abdomen and pelvis tomorrow - Discuss CT results at the next appointment  Unsteadiness and fall She reports feeling unsteady on her feet, which led to a fall last week where she hit her head and back. No recurrent falls since then.   The patient was advised to call immediately if she has any concerning symptoms in the interval. The patient voices understanding of current disease status and treatment options and is in agreement with the current care plan.  All questions were answered. The patient knows to call the clinic with any problems, questions or concerns. We can certainly see the patient much sooner if necessary. The total time spent in the appointment was 20 minutes.  Disclaimer: This note was dictated with voice recognition software. Similar sounding words can inadvertently be transcribed and may not be corrected upon review.

## 2023-08-28 ENCOUNTER — Other Ambulatory Visit (HOSPITAL_COMMUNITY): Payer: 59

## 2023-08-28 ENCOUNTER — Encounter (HOSPITAL_COMMUNITY): Payer: Self-pay

## 2023-08-28 ENCOUNTER — Ambulatory Visit (HOSPITAL_COMMUNITY)
Admission: RE | Admit: 2023-08-28 | Discharge: 2023-08-28 | Disposition: A | Discharge status: Home / Self Care | Payer: 59 | Source: Ambulatory Visit | Attending: Urology | Admitting: Urology

## 2023-08-28 DIAGNOSIS — C641 Malignant neoplasm of right kidney, except renal pelvis: Secondary | ICD-10-CM | POA: Insufficient documentation

## 2023-08-28 DIAGNOSIS — K760 Fatty (change of) liver, not elsewhere classified: Secondary | ICD-10-CM | POA: Diagnosis not present

## 2023-08-28 DIAGNOSIS — K838 Other specified diseases of biliary tract: Secondary | ICD-10-CM | POA: Diagnosis not present

## 2023-08-28 DIAGNOSIS — Z905 Acquired absence of kidney: Secondary | ICD-10-CM | POA: Diagnosis not present

## 2023-08-28 LAB — T4: T4, Total: 6.9 ug/dL (ref 4.5–12.0)

## 2023-08-28 MED ORDER — IOHEXOL 300 MG/ML  SOLN
100.0000 mL | Freq: Once | INTRAMUSCULAR | Status: AC | PRN
  Administered 2023-08-28: 100 mL via INTRAVENOUS

## 2023-08-29 ENCOUNTER — Ambulatory Visit (HOSPITAL_BASED_OUTPATIENT_CLINIC_OR_DEPARTMENT_OTHER): Admitting: Physical Therapy

## 2023-08-29 ENCOUNTER — Telehealth (HOSPITAL_BASED_OUTPATIENT_CLINIC_OR_DEPARTMENT_OTHER): Payer: Self-pay | Admitting: Physical Therapy

## 2023-08-29 ENCOUNTER — Encounter: Payer: Self-pay | Admitting: Podiatry

## 2023-08-29 ENCOUNTER — Encounter (HOSPITAL_BASED_OUTPATIENT_CLINIC_OR_DEPARTMENT_OTHER): Payer: Self-pay | Admitting: Physical Therapy

## 2023-08-29 NOTE — Telephone Encounter (Signed)
 Pt called to inform of a fall with an abrasion on knee and loss of toe nail inquiring if it is ok for her to get into pool.  Pt is diabetic and has hx of foot wound infections in the past.  Decided to have pt reach out to MD and get his clearance before beginning.  Today's appt cancelled.  Leaving next appt on the books until MD clears/

## 2023-08-30 ENCOUNTER — Encounter (HOSPITAL_BASED_OUTPATIENT_CLINIC_OR_DEPARTMENT_OTHER): Payer: Self-pay | Admitting: Physical Therapy

## 2023-08-31 ENCOUNTER — Ambulatory Visit (HOSPITAL_BASED_OUTPATIENT_CLINIC_OR_DEPARTMENT_OTHER): Admitting: Physical Therapy

## 2023-09-03 ENCOUNTER — Encounter (HOSPITAL_BASED_OUTPATIENT_CLINIC_OR_DEPARTMENT_OTHER): Payer: Self-pay | Admitting: Physical Therapy

## 2023-09-04 ENCOUNTER — Ambulatory Visit (HOSPITAL_BASED_OUTPATIENT_CLINIC_OR_DEPARTMENT_OTHER): Attending: Surgery | Admitting: Physical Therapy

## 2023-09-04 DIAGNOSIS — R262 Difficulty in walking, not elsewhere classified: Secondary | ICD-10-CM | POA: Insufficient documentation

## 2023-09-04 DIAGNOSIS — M6281 Muscle weakness (generalized): Secondary | ICD-10-CM | POA: Insufficient documentation

## 2023-09-04 NOTE — Therapy (Signed)
 OUTPATIENT PHYSICAL THERAPY TREATMENT   Patient Name: Christina Cameron MRN: [id number] DOB:05-25-1966, 57 y.o., female Today's Date: 09/04/2023  END OF SESSION:  PT End of Session - 09/04/23 1017     Visit Number 2    Number of Visits 16    Date for PT Re-Evaluation 10/06/23    Authorization Type UHC dual complete    PT Start Time 1013    PT Stop Time 1052    PT Time Calculation (min) 39 min    Behavior During Therapy WFL for tasks assessed/performed             Past Medical History:  Diagnosis Date   Arm vein blood clot, unspecified laterality    around 20 years ago as of 08/10/21, Patient doesn't remember what caused it. She was not put on blood thinners.   Arthritis    right knee   Asthma    Cancer (HCC)    cervical 1989   Cancer of kidney (HCC)    Chest pain    a. normal cors by cath in 11/2014 / 03/02/2020 nuclear stress test demonstrated no perfusion defects consistent with prior infart or current ischemia. Normal study.   Chronic diastolic (congestive) heart failure (HCC)    09/27/20 Echocardiogram in Epic showed LVEF 60 to 65%.   Complication of anesthesia    post-operative nausea & vomiting   COVID-19    05/29/19 & 05/2020 Covid infections   Depression    Diabetes mellitus, type 2 (HCC)    Dysrhythmia    heart palpitations   GERD (gastroesophageal reflux disease)    H/O cardiovascular stress test 03/02/2020   03/02/2020 Nuclear stress test in Epic showed no perusion defects consistent with prior infarct or ischemia - normal study.   Headache    migraines   High cholesterol    pt takes Crestor   History of cardiac radiofrequency ablation    Hypertension    Last cardiology office visit, 10/22/20 with Rennis Harding, NP ( see note in Epic) as of 08/09/21.   Morbid (severe) obesity due to excess calories (HCC)    Neuromuscular disorder (HCC)    severe neuropathy in feet   Osteoarthritis of knee, unspecified    Palpitations    PONV (postoperative nausea and  vomiting)    Post-COVID syndrome 05/2019   Pt experienced chest tightness, sob, palpitations & dizziness after Covid infection. See 04/2020 note from Rennis Harding, NP in [place].   PTSD (post-traumatic stress disorder)    Raynaud's syndrome    Sleep apnea 09/24/2020   sleep study indicated moderate sleep apea   SVT (supraventricular tachycardia) (HCC)    a. s/p ablation by Dr. Ladona Ridgel in 2006.   Wears glasses    prescripton reading glasses   Past Surgical History:  Procedure Laterality Date   CARDIAC CATHETERIZATION N/A 12/14/2014   Procedure: Right/Left Heart Cath and Coronary Angiography;  Surgeon: Tonny Bollman, MD;  Location: [facility] INVASIVE CV LAB;  Service: Cardiovascular;  Laterality: N/A;   CARDIAC ELECTROPHYSIOLOGY STUDY AND ABLATION     around 2006, Patient states that heart rate was up to 180 bpm.   CESAREAN SECTION     x2 1990, 2005   CYSTOSCOPY  08/17/2021   Procedure: CYSTOSCOPY;  Surgeon: Carrington Clamp, MD;  Location: [facility];  Service: Gynecology;;   ENDOMETRIAL ABLATION     HYSTEROSCOPY WITH D & C N/A 03/11/2021   Procedure: DILATATION AND CURETTAGE /HYSTEROSCOPY;  Surgeon: Marlow Baars, MD;  Location: [facility]  OR;  Service: Gynecology;  Laterality: N/A;   OPERATIVE ULTRASOUND N/A 03/11/2021   Procedure: OPERATIVE ULTRASOUND;  Surgeon: Marlow Baars, MD;  Location: [facility] OR;  Service: Gynecology;  Laterality: N/A;  ultrasound guidance needed   ROBOT ASSISTED LAPAROSCOPIC NEPHRECTOMY Right 12/25/2022   Procedure: XI ROBOTIC ASSISTED LAPAROSCOPIC NEPHRECTOMY;  Surgeon: Malen Gauze, MD;  Location: [facility];  Service: Urology;  Laterality: Right;   ROBOTIC ASSISTED LAPAROSCOPIC HYSTERECTOMY AND SALPINGECTOMY Bilateral 08/17/2021   Procedure: XI ROBOTIC ASSISTED LAPAROSCOPIC HYSTERECTOMY AND  BILATERAL SALPINGECTOMY AND OOPHERETOMY;  Surgeon: Carrington Clamp, MD;  Location: [facility];  Service: Gynecology;  Laterality: Bilateral;   Patient  Active Problem List   Diagnosis Date Noted   Encounter for antineoplastic immunotherapy 04/11/2023   Primary renal cell carcinoma of right kidney (HCC) 01/30/2023   History of right radical nephrectomy 12/28/2022   Right renal mass 12/25/2022   Cardiac chest pain 05/01/2022   Moderate obstructive sleep apnea 01/23/2022   Heart failure with preserved ejection fraction (HCC) 09/19/2021   Abnormal vaginal bleeding in postmenopausal patient 08/17/2021   Paroxysmal SVT (supraventricular tachycardia) (HCC) 09/17/2019   Upper airway cough syndrome vs atypical asthma/ cough variant  09/17/2019   Anxiety 09/15/2019   Essential hypertension, benign 05/07/2019   Chronic diastolic (congestive) heart failure (HCC)    HLD (hyperlipidemia)    Morbid (severe) obesity due to excess calories (HCC)    Osteoarthritis of knee, unspecified    Palpitations    Somnolence    Type 2 diabetes mellitus with hyperglycemia (HCC)    Raynaud's syndrome    DOE (dyspnea on exertion)    Precordial chest pain 09/08/2014     REFERRING PROVIDER:  Quentin Ore, MD     REFERRING DIAG:  K43.2 (ICD-10-CM) - Hernia, incisional  E66.01 (ICD-10-CM) - Morbid obesity (HCC)  Z68.41 (ICD-10-CM) - Body mass index 40.0-44.9, adult (HCC)  Weight loss prior to hernia surgery  Rationale for Evaluation and Treatment: Rehabilitation  THERAPY DIAG:  Muscle weakness (generalized)  Difficulty in walking, not elsewhere classified  ONSET DATE: kidney removed July 2024 and then hernia developed quickly.    SUBJECTIVE:                                                                                                                                                                                           SUBJECTIVE STATEMENT: Pt reports she completes HEP 1x/ day in the mornings.  She had a fall since evaluation and lost toe nail.  She reports she had scan of pelvis; is meeting with surgeon in 3 wks to review then hopes to  have  surgery scheduled.   POOL ACCESS:  She has pool at her appt complex but may consider joining [employer] in [place]  PERTINENT HISTORY:  Currently in tx for CA  PAIN:  Are you having pain? Yes: NPRS scale: 4/10 Pain location: across abdomen,  bilateral knees Pain description: pressure, affects bowels, hips are sore Aggravating factors: standing/walking Relieving factors: rest  PRECAUTIONS:  None  RED FLAGS: None   WEIGHT BEARING RESTRICTIONS:  No  FALLS:  Has patient fallen in last 6 months? No  LIVING ENVIRONMENT: Alone but daughter is local- student at [employer]  OCCUPATION:  On disability  PLOF:  Independent  PATIENT GOALS:  Prep for surgery    OBJECTIVE:  Note: Objective measures were completed at Evaluation unless otherwise noted.  PATIENT SURVEYS:  Patient-Specific Activity Scoring Scheme  "0" represents "unable to perform." "10" represents "able to perform at prior level. 0 1 2 3 4 5 6 7 8 9  10 (Date and Score)   Activity Eval 3/10    1. Getting out of bed  4    2. Getting out of tub  3    3. Walking for ADLs 5   4.    5.    Score 12    Total score = sum of the activity scores/number of activities Minimum detectable change (90%CI) for average score = 2 points Minimum detectable change (90%CI) for single activity score = 3 points     COGNITIVE STATUS: Within functional limits for tasks assessed   SENSATION: Bilateral neuropathy- numb in toes and hand, tingling and burning in feet  EDEMA:  Yes: pt reports bilat LE edema  POSTURE:  Hernia displaced to right lower abdominal quadrant  HAND DOMINANCE:  Right  GAIT: Comments: Rt trendelenburg, Rt LE ER   OBJECTIVE: 3/11: unable to stand from chair without bilateral UE  Trunk sidebend in gait, antalgic on right                                                                                                                              TREATMENT DATE:  [facility] Adult PT Treatment:                                                 DATE: 09/04/23 Pt seen for aquatic therapy today.  Treatment took place in water 3.5-4.75 ft in depth at the [facility] pool. Temp of water was 91.  Pt entered/exited the pool via stairs independently with bilat rail.  - Intro to aquatic therapy principles - unsupported walking forward/ backward, 4 laps - unsupported side stepping, with arm addct/ abdct -> with rainbow hand floats x 2 laps - farmer carry while marching forward/ backward with bilat rainbow -> single yellow - UE on wall:  hip add/abd 2 x 10 ; hip flexion /extension  2 x 10;   toe/heel raises x 10; - TrA set with 1/2 hollow noodle pull down to thighs x 10  - STS at bench in water with feet on blue step, x 5 with forward arm reach and cues for hip hinge and controlled descent  Pt requires the buoyancy and hydrostatic pressure of water for support, and to offload joints by unweighting joint load by at least 50 % in navel deep water and by at least 75-80% in chest to neck deep water.  Viscosity of the water is needed for resistance of strengthening. Water current perturbations provides challenge to standing balance requiring increased core activation.      PATIENT EDUCATION:  Education details: intro to aquatic therapy;  exercise form/rationale Person educated: Patient Education method: Explanation, Demonstration, Tactile cues, Verbal cues, and Handouts Education comprehension: verbalized understanding, returned demonstration, verbal cues required, tactile cues required, and needs further education  HOME EXERCISE PROGRAM: [id number]   ASSESSMENT:  CLINICAL IMPRESSION: Pt demonstrates safety and independence in aquatic setting with therapist instructing from deck. Pt demonstrates confidence in setting, moving throughout all depths easily.  Pt is directed through various movement patterns and trials in both sitting and standing positions. Minor increase in R low back discomfort with  R hip ext during leg swings. Otherwise, all exercises tolerated without increase in pain.  Encouraged pt to bring cane next visit in the event LEs are tired after session.  Goals are ongoing.     From initial evaluation:  Patient is a 58 y.o. F who was seen today for physical therapy evaluation and treatment for prehab prior to surgical intervention for incisional hernia in right lower abdominal quadrant. Patient has significant discomfort in knees and hips with minimal ability to engage abdominal wall. Pt will benefit from skilled, aquatic therapy to reduce burden of gravity and uneven distribution of weight to improve functional strength in preparation for surgery.     REHAB POTENTIAL: Fair prehab  CLINICAL DECISION MAKING: Stable/uncomplicated  EVALUATION COMPLEXITY: Low   GOALS: Goals reviewed with patient? Yes  SHORT TERM GOALS: Target date: 08/25/23  Verbalize ability to utilize glut set throughout the day Baseline: Goal status: INITIAL  2.  Good form presented for stretches Baseline:  Goal status: INITIAL  3.  Find a good working level in aquatic exercises Baseline:  Goal status: INITIAL   LONG TERM GOALS: Target date: POC date  Verbalize readiness for surgery regarding MSK strength Baseline:  Goal status: INITIAL  2.  PSFS to improve by [facility]ID Baseline:  Goal status: INITIAL  3.  Minimal use of UEs for standing from a chair.  Baseline:  Goal status: INITIAL     PLAN:  PT FREQUENCY: 1-2x/week  PT DURATION: POC date  PLANNED INTERVENTIONS: 97164- PT Re-evaluation, 97110-Therapeutic exercises, 97530- Therapeutic activity, 97112- Neuromuscular re-education, 97535- Self Care, 30865- Manual therapy, [phone]- Gait training, [phone]- Aquatic Therapy, Patient/Family education, Balance training, Stair training, Taping, Joint mobilization, Spinal mobilization, Scar mobilization, Cryotherapy, and Moist heat.  PLAN FOR NEXT SESSION: ACTIVE CA*, aquatics   [place], [place] 09/04/23 11:02 AM [facility] [address] [place], [place], [address] Phone: [phone]   Fax:  [phone]

## 2023-09-06 ENCOUNTER — Ambulatory Visit (HOSPITAL_BASED_OUTPATIENT_CLINIC_OR_DEPARTMENT_OTHER): Admitting: Physical Therapy

## 2023-09-06 ENCOUNTER — Encounter (HOSPITAL_BASED_OUTPATIENT_CLINIC_OR_DEPARTMENT_OTHER): Payer: Self-pay | Admitting: Physical Therapy

## 2023-09-06 DIAGNOSIS — R262 Difficulty in walking, not elsewhere classified: Secondary | ICD-10-CM | POA: Diagnosis not present

## 2023-09-06 DIAGNOSIS — M6281 Muscle weakness (generalized): Secondary | ICD-10-CM

## 2023-09-06 NOTE — Therapy (Signed)
 OUTPATIENT PHYSICAL THERAPY TREATMENT   Patient Name: Christina Cameron MRN: [id number] DOB:08/03/1965, 58 y.o., female Today's Date: 09/06/2023  END OF SESSION:  PT End of Session - 09/06/23 0938     Visit Number 3    Number of Visits 16    Date for PT Re-Evaluation 10/06/23    Authorization Type UHC dual complete    PT Start Time 0930    PT Stop Time 1008    PT Time Calculation (min) 38 min    Activity Tolerance Patient tolerated treatment well    Behavior During Therapy WFL for tasks assessed/performed             Past Medical History:  Diagnosis Date   Arm vein blood clot, unspecified laterality    around 20 years ago as of 08/10/21, Patient doesn't remember what caused it. She was not put on blood thinners.   Arthritis    right knee   Asthma    Cancer (HCC)    cervical 1989   Cancer of kidney (HCC)    Chest pain    a. normal cors by cath in 11/2014 / 03/02/2020 nuclear stress test demonstrated no perfusion defects consistent with prior infart or current ischemia. Normal study.   Chronic diastolic (congestive) heart failure (HCC)    09/27/20 Echocardiogram in Epic showed LVEF 60 to 65%.   Complication of anesthesia    post-operative nausea & vomiting   COVID-19    05/29/19 & 05/2020 Covid infections   Depression    Diabetes mellitus, type 2 (HCC)    Dysrhythmia    heart palpitations   GERD (gastroesophageal reflux disease)    H/O cardiovascular stress test 03/02/2020   03/02/2020 Nuclear stress test in Epic showed no perusion defects consistent with prior infarct or ischemia - normal study.   Headache    migraines   High cholesterol    pt takes Crestor   History of cardiac radiofrequency ablation    Hypertension    Last cardiology office visit, 10/22/20 with Rennis Harding, NP ( see note in Epic) as of 08/09/21.   Morbid (severe) obesity due to excess calories (HCC)    Neuromuscular disorder (HCC)    severe neuropathy in feet   Osteoarthritis of knee, unspecified     Palpitations    PONV (postoperative nausea and vomiting)    Post-COVID syndrome 05/2019   Pt experienced chest tightness, sob, palpitations & dizziness after Covid infection. See 04/2020 note from Rennis Harding, NP in [place].   PTSD (post-traumatic stress disorder)    Raynaud's syndrome    Sleep apnea 09/24/2020   sleep study indicated moderate sleep apea   SVT (supraventricular tachycardia) (HCC)    a. s/p ablation by Dr. Ladona Ridgel in 2006.   Wears glasses    prescripton reading glasses   Past Surgical History:  Procedure Laterality Date   CARDIAC CATHETERIZATION N/A 12/14/2014   Procedure: Right/Left Heart Cath and Coronary Angiography;  Surgeon: Tonny Bollman, MD;  Location: [facility] INVASIVE CV LAB;  Service: Cardiovascular;  Laterality: N/A;   CARDIAC ELECTROPHYSIOLOGY STUDY AND ABLATION     around 2006, Patient states that heart rate was up to 180 bpm.   CESAREAN SECTION     x2 1990, 2005   CYSTOSCOPY  08/17/2021   Procedure: CYSTOSCOPY;  Surgeon: Carrington Clamp, MD;  Location: [facility];  Service: Gynecology;;   ENDOMETRIAL ABLATION     HYSTEROSCOPY WITH D & C N/A 03/11/2021   Procedure: DILATATION AND CURETTAGE /  HYSTEROSCOPY;  Surgeon: Marlow Baars, MD;  Location: [facility] OR;  Service: Gynecology;  Laterality: N/A;   OPERATIVE ULTRASOUND N/A 03/11/2021   Procedure: OPERATIVE ULTRASOUND;  Surgeon: Marlow Baars, MD;  Location: [facility] OR;  Service: Gynecology;  Laterality: N/A;  ultrasound guidance needed   ROBOT ASSISTED LAPAROSCOPIC NEPHRECTOMY Right 12/25/2022   Procedure: XI ROBOTIC ASSISTED LAPAROSCOPIC NEPHRECTOMY;  Surgeon: Malen Gauze, MD;  Location: [facility];  Service: Urology;  Laterality: Right;   ROBOTIC ASSISTED LAPAROSCOPIC HYSTERECTOMY AND SALPINGECTOMY Bilateral 08/17/2021   Procedure: XI ROBOTIC ASSISTED LAPAROSCOPIC HYSTERECTOMY AND  BILATERAL SALPINGECTOMY AND OOPHERETOMY;  Surgeon: Carrington Clamp, MD;  Location: [facility];  Service:  Gynecology;  Laterality: Bilateral;   Patient Active Problem List   Diagnosis Date Noted   Encounter for antineoplastic immunotherapy 04/11/2023   Primary renal cell carcinoma of right kidney (HCC) 01/30/2023   History of right radical nephrectomy 12/28/2022   Right renal mass 12/25/2022   Cardiac chest pain 05/01/2022   Moderate obstructive sleep apnea 01/23/2022   Heart failure with preserved ejection fraction (HCC) 09/19/2021   Abnormal vaginal bleeding in postmenopausal patient 08/17/2021   Paroxysmal SVT (supraventricular tachycardia) (HCC) 09/17/2019   Upper airway cough syndrome vs atypical asthma/ cough variant  09/17/2019   Anxiety 09/15/2019   Essential hypertension, benign 05/07/2019   Chronic diastolic (congestive) heart failure (HCC)    HLD (hyperlipidemia)    Morbid (severe) obesity due to excess calories (HCC)    Osteoarthritis of knee, unspecified    Palpitations    Somnolence    Type 2 diabetes mellitus with hyperglycemia (HCC)    Raynaud's syndrome    DOE (dyspnea on exertion)    Precordial chest pain 09/08/2014     REFERRING PROVIDER:  Quentin Ore, MD     REFERRING DIAG:  K43.2 (ICD-10-CM) - Hernia, incisional  E66.01 (ICD-10-CM) - Morbid obesity (HCC)  Z68.41 (ICD-10-CM) - Body mass index 40.0-44.9, adult (HCC)  Weight loss prior to hernia surgery  Rationale for Evaluation and Treatment: Rehabilitation  THERAPY DIAG:  Muscle weakness (generalized)  Difficulty in walking, not elsewhere classified  ONSET DATE: kidney removed July 2024 and then hernia developed quickly.    SUBJECTIVE:                                                                                                                                                                                           SUBJECTIVE STATEMENT: Pt reports she was sore after session in abdomen.  Legs did not feel weak after session.   POOL ACCESS:  She has pool at her appt complex but may  consider joining  [employer] in [place]  PERTINENT HISTORY:  Currently in tx for CA  PAIN:  Are you having pain? Yes: NPRS scale: 3/10 Pain location: across abdomen on R Pain description: pressure, affects bowels, hips are sore Aggravating factors: standing/walking Relieving factors: rest  PRECAUTIONS:  None  RED FLAGS: None   WEIGHT BEARING RESTRICTIONS:  No  FALLS:  Has patient fallen in last 6 months? No  LIVING ENVIRONMENT: Alone but daughter is local- student at [employer]  OCCUPATION:  On disability  PLOF:  Independent  PATIENT GOALS:  Prep for surgery    OBJECTIVE:  Note: Objective measures were completed at Evaluation unless otherwise noted.  PATIENT SURVEYS:  Patient-Specific Activity Scoring Scheme  "0" represents "unable to perform." "10" represents "able to perform at prior level. 0 1 2 3 4 5 6 7 8 9  10 (Date and Score)   Activity Eval 3/10    1. Getting out of bed  4    2. Getting out of tub  3    3. Walking for ADLs 5   4.    5.    Score 12    Total score = sum of the activity scores/number of activities Minimum detectable change (90%CI) for average score = 2 points Minimum detectable change (90%CI) for single activity score = 3 points     COGNITIVE STATUS: Within functional limits for tasks assessed   SENSATION: Bilateral neuropathy- numb in toes and hand, tingling and burning in feet  EDEMA:  Yes: pt reports bilat LE edema  POSTURE:  Hernia displaced to right lower abdominal quadrant  HAND DOMINANCE:  Right  GAIT: Comments: Rt trendelenburg, Rt LE ER   OBJECTIVE: 3/11: unable to stand from chair without bilateral UE  Trunk sidebend in gait, antalgic on right                                                                                                                              TREATMENT DATE:  [facility] Adult PT Treatment:                                                DATE: 09/06/23 Pt seen for aquatic therapy today.   Treatment took place in water 3.5-4.75 ft in depth at the [facility] pool. Temp of water was 91.  Pt entered/exited the pool via stairs independently with bilat rail.  - unsupported walking forward/ backward, 4 laps - unsupported side stepping, with arm addct/ abdct -> with rainbow hand floats x 2 laps - farmer carry while marching forward/ backward with bilat rainbow -> single yellow - tricep press down with yellow hand floats 2x10 - UE on wall:  hip add/abd 2 x 10 ; hip flexion /extension  x 10;   toe/heel raises x 20;  -  marching backward/ forward with  row motion with rainbow hand floats  - straddling noodle, UE on corner: cycling, cross country ski, hip abdct/ addct  (trial with noodle under arms-> decreased balance) - TrA set with 1/2 hollow noodle pull down to thighs  2 x 10  - return to walking forward /backward  - wide stance with reciprocal arm swing with light resistance bells; bilat shoulder horiz abdct/ addct   Pt requires the buoyancy and hydrostatic pressure of water for support, and to offload joints by unweighting joint load by at least 50 % in navel deep water and by at least 75-80% in chest to neck deep water.  Viscosity of the water is needed for resistance of strengthening. Water current perturbations provides challenge to standing balance requiring increased core activation.      PATIENT EDUCATION:  Education details: intro to aquatic therapy;  exercise form/rationale Person educated: Patient Education method: Explanation, Demonstration, Tactile cues, Verbal cues Education comprehension: verbalized understanding, returned demonstration, verbal cues required, tactile cues required, and needs further education  HOME EXERCISE PROGRAM: [id number]   ASSESSMENT:  CLINICAL IMPRESSION: Pt tolerated aquatic exercises without increase in pain. No decrease in pain in abdomen during session. Goals are ongoing. Will continue to progress as tolerated.     From  initial evaluation:  Patient is a 58 y.o. F who was seen today for physical therapy evaluation and treatment for prehab prior to surgical intervention for incisional hernia in right lower abdominal quadrant. Patient has significant discomfort in knees and hips with minimal ability to engage abdominal wall. Pt will benefit from skilled, aquatic therapy to reduce burden of gravity and uneven distribution of weight to improve functional strength in preparation for surgery.     REHAB POTENTIAL: Fair prehab  CLINICAL DECISION MAKING: Stable/uncomplicated  EVALUATION COMPLEXITY: Low   GOALS: Goals reviewed with patient? Yes  SHORT TERM GOALS: Target date: 08/25/23  Verbalize ability to utilize glut set throughout the day Baseline: Goal status: INITIAL  2.  Good form presented for stretches Baseline:  Goal status: INITIAL  3.  Find a good working level in aquatic exercises Baseline:  Goal status: INITIAL   LONG TERM GOALS: Target date: POC date  Verbalize readiness for surgery regarding MSK strength Baseline:  Goal status: INITIAL  2.  PSFS to improve by MCID Baseline:  Goal status: INITIAL  3.  Minimal use of UEs for standing from a chair.  Baseline:  Goal status: INITIAL     PLAN:  PT FREQUENCY: 1-2x/week  PT DURATION: POC date  PLANNED INTERVENTIONS: 97164- PT Re-evaluation, 97110-Therapeutic exercises, 97530- Therapeutic activity, 97112- Neuromuscular re-education, 97535- Self Care, 21308- Manual therapy, [phone]- Gait training, [phone]- Aquatic Therapy, Patient/Family education, Balance training, Stair training, Taping, Joint mobilization, Spinal mobilization, Scar mobilization, Cryotherapy, and Moist heat.  PLAN FOR NEXT SESSION: ACTIVE CA*, aquatics  [place], [place] 09/06/23 12:36 PM [facility] [address] [place], [place], [address] Phone: [phone]   Fax:  [phone]

## 2023-09-07 ENCOUNTER — Other Ambulatory Visit: Payer: 59

## 2023-09-08 DIAGNOSIS — R051 Acute cough: Secondary | ICD-10-CM | POA: Diagnosis not present

## 2023-09-10 ENCOUNTER — Other Ambulatory Visit: Payer: 59

## 2023-09-10 ENCOUNTER — Ambulatory Visit: Admitting: Podiatry

## 2023-09-10 ENCOUNTER — Telehealth: Admitting: Physician Assistant

## 2023-09-10 ENCOUNTER — Ambulatory Visit: Payer: 59

## 2023-09-10 ENCOUNTER — Ambulatory Visit: Payer: 59 | Admitting: Internal Medicine

## 2023-09-10 DIAGNOSIS — J069 Acute upper respiratory infection, unspecified: Secondary | ICD-10-CM | POA: Diagnosis not present

## 2023-09-10 DIAGNOSIS — B9689 Other specified bacterial agents as the cause of diseases classified elsewhere: Secondary | ICD-10-CM

## 2023-09-10 MED ORDER — AMOXICILLIN-POT CLAVULANATE 875-125 MG PO TABS: 1.0000 | ORAL_TABLET | Freq: Two times a day (BID) | ORAL | 0 refills | Status: DC

## 2023-09-10 NOTE — Progress Notes (Signed)

## 2023-09-11 ENCOUNTER — Ambulatory Visit (HOSPITAL_BASED_OUTPATIENT_CLINIC_OR_DEPARTMENT_OTHER): Admitting: Physical Therapy

## 2023-09-12 ENCOUNTER — Encounter (HOSPITAL_BASED_OUTPATIENT_CLINIC_OR_DEPARTMENT_OTHER): Payer: Self-pay | Admitting: Physical Therapy

## 2023-09-13 ENCOUNTER — Ambulatory Visit (HOSPITAL_BASED_OUTPATIENT_CLINIC_OR_DEPARTMENT_OTHER): Admitting: Physical Therapy

## 2023-09-14 ENCOUNTER — Ambulatory Visit: Payer: 59 | Admitting: Urology

## 2023-09-17 ENCOUNTER — Inpatient Hospital Stay: Payer: 59 | Attending: Internal Medicine

## 2023-09-17 ENCOUNTER — Inpatient Hospital Stay (HOSPITAL_BASED_OUTPATIENT_CLINIC_OR_DEPARTMENT_OTHER): Payer: 59 | Admitting: Internal Medicine

## 2023-09-17 ENCOUNTER — Inpatient Hospital Stay: Payer: 59

## 2023-09-17 VITALS — BP 140/72 | HR 83 | Temp 98.0°F | Resp 17 | Ht 66.0 in | Wt 268.0 lb

## 2023-09-17 DIAGNOSIS — C641 Malignant neoplasm of right kidney, except renal pelvis: Secondary | ICD-10-CM | POA: Insufficient documentation

## 2023-09-17 DIAGNOSIS — Z5112 Encounter for antineoplastic immunotherapy: Secondary | ICD-10-CM | POA: Insufficient documentation

## 2023-09-17 DIAGNOSIS — Z79899 Other long term (current) drug therapy: Secondary | ICD-10-CM | POA: Diagnosis not present

## 2023-09-17 LAB — CMP (CANCER CENTER ONLY)
ALT: 18 U/L (ref 0–44)
AST: 17 U/L (ref 15–41)
Albumin: 4.2 g/dL (ref 3.5–5.0)
Alkaline Phosphatase: 44 U/L (ref 38–126)
Anion gap: 8 (ref 5–15)
BUN: 26 mg/dL — ABNORMAL HIGH (ref 6–20)
CO2: 25 mmol/L (ref 22–32)
Calcium: 9.1 mg/dL (ref 8.9–10.3)
Chloride: 104 mmol/L (ref 98–111)
Creatinine: 1.06 mg/dL — ABNORMAL HIGH (ref 0.44–1.00)
GFR, Estimated: >60 mL/min (ref >60)
Glucose, Bld: 197 mg/dL — ABNORMAL HIGH (ref 70–99)
Potassium: 4 mmol/L (ref 3.5–5.1)
Sodium: 137 mmol/L (ref 135–145)
Total Bilirubin: 0.5 mg/dL (ref 0.0–1.2)
Total Protein: 7 g/dL (ref 6.5–8.1)

## 2023-09-17 LAB — CBC WITH DIFFERENTIAL (CANCER CENTER ONLY)
Abs Immature Granulocytes: 0.05 10*3/uL (ref 0.00–0.07)
Basophils Absolute: 0.1 10*3/uL (ref 0.0–0.1)
Basophils Relative: 1 %
Eosinophils Absolute: 0.3 10*3/uL (ref 0.0–0.5)
Eosinophils Relative: 3 %
HCT: 37.1 % (ref 36.0–46.0)
Hemoglobin: 13 g/dL (ref 12.0–15.0)
Immature Granulocytes: 1 %
Lymphocytes Relative: 29 %
Lymphs Abs: 2.5 10*3/uL (ref 0.7–4.0)
MCH: 30.2 pg (ref 26.0–34.0)
MCHC: 35 g/dL (ref 30.0–36.0)
MCV: 86.1 fL (ref 80.0–100.0)
Monocytes Absolute: 0.5 10*3/uL (ref 0.1–1.0)
Monocytes Relative: 6 %
Neutro Abs: 5.2 10*3/uL (ref 1.7–7.7)
Neutrophils Relative %: 60 %
Platelet Count: 205 10*3/uL (ref 150–400)
RBC: 4.31 MIL/uL (ref 3.87–5.11)
RDW: 13.7 % (ref 11.5–15.5)
WBC Count: 8.6 10*3/uL (ref 4.0–10.5)
nRBC: 0 % (ref 0.0–0.2)

## 2023-09-17 LAB — TSH: TSH: 4.611 u[IU]/mL — ABNORMAL HIGH (ref 0.350–4.500)

## 2023-09-17 MED ORDER — SODIUM CHLORIDE 0.9 % IV SOLN
200.0000 mg | Freq: Once | INTRAVENOUS | Status: AC
  Administered 2023-09-17: 200 mg via INTRAVENOUS
  Filled 2023-09-17: qty 200

## 2023-09-17 MED ORDER — SODIUM CHLORIDE 0.9 % IV SOLN: Freq: Once | INTRAVENOUS | Status: AC

## 2023-09-17 NOTE — Progress Notes (Signed)
 [facility] [phone]   [phone]  OFFICE PROGRESS NOTE  Fredick Jarred, PA-C 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  The ureteral, vascular and all margins of resection were negative for tumor.   CURRENT THERAPY: Adjuvant treatment with immunotherapy with Keytruda  200 Mg IV every 3 weeks.  First dose was given on 02/13/2023.  Status post 9 cycles.  INTERVAL HISTORY: Christina Cameron 58 y.o. female returns to the clinic today for follow-up visit.Discussed the use of AI scribe software for clinical note transcription with the patient, who gave verbal consent to proceed.  History of Present Illness   Christina Cameron is a 58 year old female with stage three clear cell renal cell carcinoma who presents for evaluation with repeat CT scan before starting cycle ten of immunotherapy.  Diagnosed with stage three clear cell renal cell carcinoma of the right kidney in July 2024, she underwent a right radical nephrectomy. She is currently undergoing adjuvant immunotherapy with Keytruda  (pembrolizumab ) 200 mg IV every three weeks and is preparing to start cycle ten. She has completed nine cycles so far.  She feels 'real tired' and 'miserable' since her last visit. Additionally, she experiences significant discomfort due to a large right lateral Spigelian hernia containing numerous loops of small bowel and the ascending colon/cecum. The hernia is chronic but mildly progressive. She has been evaluated by a surgeon at [facility], who recommended a referral to [facility] for surgical  intervention due to the size and complexity of the hernia.  A recent CT scan of the abdomen and pelvis showed no evidence of recurrent or metastatic disease related to her cancer.          MEDICAL HISTORY: Past Medical History:  Diagnosis Date   Arm vein blood clot, unspecified laterality    around 20 years ago as of 08/10/21, Patient doesn't remember what caused it. She was not put on blood thinners.   Arthritis    right knee   Asthma    Cancer (HCC)    cervical 1989   Cancer of kidney (HCC)    Chest pain    a. normal cors by cath in 11/2014 / 03/02/2020 nuclear stress test demonstrated no perfusion defects consistent with prior infart or current ischemia. Normal study.   Chronic diastolic (congestive) heart failure (HCC)    09/27/20 Echocardiogram in Epic showed LVEF 60 to 65%.   Complication of anesthesia    post-operative nausea & vomiting   COVID-19    05/29/19 & 05/2020 Covid infections   Depression    Diabetes mellitus, type 2 (HCC)    Dysrhythmia    heart palpitations   GERD (gastroesophageal reflux disease)    H/O cardiovascular stress test 03/02/2020   03/02/2020 Nuclear stress test in Epic showed no perusion defects consistent with prior infarct or ischemia - normal study.   Headache    migraines   High cholesterol    pt takes Crestor    History of cardiac radiofrequency ablation    Hypertension    Last cardiology office visit, 10/22/20 with Mohammed Andrew,  NP ( see note in Epic) as of 08/09/21.   Morbid (severe) obesity due to excess calories (HCC)    Neuromuscular disorder (HCC)    severe neuropathy in feet   Osteoarthritis of knee, unspecified    Palpitations    PONV (postoperative nausea and vomiting)    Post-COVID syndrome 05/2019   Pt experienced chest tightness, sob, palpitations & dizziness after Covid infection. See 04/2020 note from Mohammed Andrew, NP in [place].   PTSD (post-traumatic stress disorder)    Raynaud's syndrome    Sleep apnea 09/24/2020   sleep  study indicated moderate sleep apea   SVT (supraventricular tachycardia) (HCC)    a. s/p ablation by Dr. Carolynne Citron in 2006.   Wears glasses    prescripton reading glasses    ALLERGIES:  has no known allergies.  MEDICATIONS:  Current Outpatient Medications  Medication Sig Dispense Refill   albuterol  (VENTOLIN  HFA) 108 (90 Base) MCG/ACT inhaler Inhale 1-2 puffs into the lungs every 6 (six) hours as needed for wheezing or shortness of breath. 18 g 0   amoxicillin -clavulanate (AUGMENTIN ) 875-125 MG tablet Take 1 tablet by mouth 2 (two) times daily. 20 tablet 0   Azelastine -Fluticasone  137-50 MCG/ACT SUSP Place 1 spray into the nose every 12 (twelve) hours. (Patient not taking: Reported on 01/26/2023) 23 g 0   brexpiprazole  (REXULTI ) 2 MG TABS tablet Take 2 mg by mouth daily.     buPROPion  (WELLBUTRIN  XL) 150 MG 24 hr tablet Take 150 mg by mouth every morning.     busPIRone  (BUSPAR ) 10 MG tablet Take 10 mg by mouth 2 (two) times daily.     cetirizine  (ZYRTEC ) 10 MG tablet Take 10 mg by mouth daily as needed for allergies. (Patient not taking: Reported on 01/01/2023)     cyclobenzaprine  (FLEXERIL ) 5 MG tablet Take 1 tablet (5 mg total) by mouth 3 (three) times daily as needed for muscle spasms. Do not drive or operate a heavy machinery after taking this medicine. 20 tablet 0   estradiol  (ESTRACE ) 0.5 MG tablet Take 0.5 mg by mouth daily.     ezetimibe  (ZETIA ) 10 MG tablet Take 1 tablet (10 mg total) by mouth daily. 90 tablet 3   furosemide  (LASIX ) 20 MG tablet Take 1 tablet (20 mg total) by mouth daily. (Patient taking differently: Take 20 mg by mouth daily as needed for fluid or edema.) 90 tablet 3   gabapentin  (NEURONTIN ) 300 MG capsule Take 1 capsule (300 mg total) by mouth 3 (three) times daily. Start at bedtime and slowly increase to 3x a day as needed to make sure no side effects like sedation. (Patient taking differently: Take 300 mg by mouth at bedtime. Start at bedtime and slowly increase to 3x a  day as needed to make sure no side effects like sedation.) 90 capsule 11   hydrOXYzine  (ATARAX ) 50 MG tablet Take 100 mg by mouth at bedtime.     indapamide  (LOZOL ) 2.5 MG tablet Take 2.5 mg by mouth daily as needed (fluid).     isosorbide  mononitrate (IMDUR ) 30 MG 24 hr tablet Take 1 tablet (30 mg total) by mouth daily. (Patient not taking: Reported on 01/01/2023) 90 tablet 3   lamoTRIgine  (LAMICTAL ) 200 MG tablet Take 200 mg by mouth daily.     metFORMIN  (GLUCOPHAGE ) 500 MG tablet Take 1,000 mg by mouth 2 (two) times daily with a meal.     methylPREDNISolone  (MEDROL  DOSEPAK) 4 MG TBPK tablet Take as directed (Patient not taking: Reported on 08/27/2023)  1 each 0   metoprolol  tartrate (LOPRESSOR ) 50 MG tablet Take 2 tablets (100 mg total) by mouth 2 (two) times daily. 180 tablet 3   mupirocin  ointment (BACTROBAN ) 2 % Apply 1 Application topically 2 (two) times daily. 30 g 2   mupirocin  ointment (BACTROBAN ) 2 % Apply 1 Application topically 2 (two) times daily. 22 g 0   nitroGLYCERIN  (NITROSTAT ) 0.4 MG SL tablet DISSOLVE ONE TABLET UNDER THE TONGUE EVERY 5 MINUTES AS NEEDED FOR CHEST PAIN.  DO NOT EXCEED A TOTAL OF 3 DOSES IN 15 MINUTES 75 tablet 0   oxyCODONE -acetaminophen  (PERCOCET) 5-325 MG tablet Take 1 tablet by mouth every 4 (four) hours as needed for severe pain (pain score 7-10). 30 tablet 0   prochlorperazine  (COMPAZINE ) 10 MG tablet Take 1 tablet (10 mg total) by mouth every 6 (six) hours as needed for nausea or vomiting. 30 tablet 1   rosuvastatin  (CRESTOR ) 40 MG tablet Take 1 tablet (40 mg total) by mouth daily. 90 tablet 3   Semaglutide, 2 MG/DOSE, (OZEMPIC, 2 MG/DOSE,) 8 MG/3ML SOPN Inject 2 mg into the skin every 7 (seven) days.     sertraline  (ZOLOFT ) 100 MG tablet Take 200 mg by mouth daily.     tizanidine  (ZANAFLEX ) 2 MG capsule Take 1 capsule (2 mg total) by mouth 3 (three) times daily. 15 capsule 0   No current facility-administered medications for this visit.    SURGICAL  HISTORY:  Past Surgical History:  Procedure Laterality Date   CARDIAC CATHETERIZATION N/A 12/14/2014   Procedure: Right/Left Heart Cath and Coronary Angiography;  Surgeon: Arnoldo Lapping, MD;  Location: [facility] INVASIVE CV LAB;  Service: Cardiovascular;  Laterality: N/A;   CARDIAC ELECTROPHYSIOLOGY STUDY AND ABLATION     around 2006, Patient states that heart rate was up to 180 bpm.   CESAREAN SECTION     x2 1990, 2005   CYSTOSCOPY  08/17/2021   Procedure: CYSTOSCOPY;  Surgeon: Matt Song, MD;  Location: [facility];  Service: Gynecology;;   ENDOMETRIAL ABLATION     HYSTEROSCOPY WITH D & C N/A 03/11/2021   Procedure: DILATATION AND CURETTAGE /HYSTEROSCOPY;  Surgeon: Luan Rumpf, MD;  Location: [facility] OR;  Service: Gynecology;  Laterality: N/A;   OPERATIVE ULTRASOUND N/A 03/11/2021   Procedure: OPERATIVE ULTRASOUND;  Surgeon: Luan Rumpf, MD;  Location: [facility] OR;  Service: Gynecology;  Laterality: N/A;  ultrasound guidance needed   ROBOT ASSISTED LAPAROSCOPIC NEPHRECTOMY Right 12/25/2022   Procedure: XI ROBOTIC ASSISTED LAPAROSCOPIC NEPHRECTOMY;  Surgeon: Marco Severs, MD;  Location: [facility];  Service: Urology;  Laterality: Right;   ROBOTIC ASSISTED LAPAROSCOPIC HYSTERECTOMY AND SALPINGECTOMY Bilateral 08/17/2021   Procedure: XI ROBOTIC ASSISTED LAPAROSCOPIC HYSTERECTOMY AND  BILATERAL SALPINGECTOMY AND OOPHERETOMY;  Surgeon: Matt Song, MD;  Location: [facility];  Service: Gynecology;  Laterality: Bilateral;    REVIEW OF SYSTEMS:  Constitutional: positive for fatigue Eyes: negative Ears, nose, mouth, throat, and face: negative Respiratory: negative Cardiovascular: negative Gastrointestinal: positive for abdominal pain Genitourinary:negative Integument/breast: negative Hematologic/lymphatic: negative Musculoskeletal:negative Neurological: negative Behavioral/Psych: negative Endocrine: negative Allergic/Immunologic: negative   PHYSICAL  EXAMINATION: General appearance: alert, cooperative, fatigued, and no distress Head: Normocephalic, without obvious abnormality, atraumatic Neck: no adenopathy, no JVD, supple, symmetrical, trachea midline, and thyroid  not enlarged, symmetric, no tenderness/mass/nodules Lymph nodes: Cervical, supraclavicular, and axillary nodes normal. Resp: clear to auscultation bilaterally Back: symmetric, no curvature. ROM normal. No CVA tenderness. Cardio: regular rate and rhythm, S1, S2 normal, no murmur, click, rub or gallop GI:  soft, non-tender; bowel sounds normal; no masses,  no organomegaly Extremities: extremities normal, atraumatic, no cyanosis or edema Neurologic: Alert and oriented X 3, normal strength and tone. Normal symmetric reflexes. Normal coordination and gait  ECOG PERFORMANCE STATUS: 1 - Symptomatic but completely ambulatory  Blood pressure (!) 140/72, pulse 83, temperature 98 F (36.7 C), temperature source Temporal, resp. rate 17, height 5\' 6"  (1.676 m), weight 268 lb (121.6 kg), SpO2 100%.  LABORATORY DATA: Lab Results  Component Value Date   WBC 8.6 09/17/2023   HGB 13.0 09/17/2023   HCT 37.1 09/17/2023   MCV 86.1 09/17/2023   PLT 205 09/17/2023      Chemistry      Component Value Date/Time   NA 137 08/27/2023 1114   NA 137 06/08/2023 1049   NA 140 10/19/2011 0919   K 4.6 08/27/2023 1114   K 4.1 10/19/2011 0919   CL 102 08/27/2023 1114   CL 104 10/19/2011 0919   CO2 29 08/27/2023 1114   CO2 24 10/19/2011 0919   BUN 19 08/27/2023 1114   BUN 21 06/08/2023 1049   BUN 16 10/19/2011 0919   CREATININE 0.97 08/27/2023 1114   CREATININE 0.63 10/19/2011 0919   GLU 297 04/15/2019 0000      Component Value Date/Time   CALCIUM  9.1 08/27/2023 1114   CALCIUM  8.5 10/19/2011 0919   ALKPHOS 48 08/27/2023 1114   ALKPHOS 57 10/19/2011 0919   AST 19 08/27/2023 1114   ALT 21 08/27/2023 1114   ALT 24 10/19/2011 0919   BILITOT 0.5 08/27/2023 1114       RADIOGRAPHIC  STUDIES: CT ABDOMEN PELVIS W WO CONTRAST Result Date: 09/01/2023 CLINICAL DATA:  Right renal cell cancer, status post nephrectomy EXAM: CT ABDOMEN AND PELVIS WITHOUT AND WITH CONTRAST TECHNIQUE: Multidetector CT imaging of the abdomen and pelvis was performed following the standard protocol before and following the bolus administration of intravenous contrast. RADIATION DOSE REDUCTION: This exam was performed according to the departmental dose-optimization program which includes automated exposure control, adjustment of the mA and/or kV according to patient size and/or use of iterative reconstruction technique. CONTRAST:  OMNIPAQUE  IOHEXOL  300 MG/ML  SOLN COMPARISON:  CT abdomen/pelvis dated 01/09/2023 FINDINGS: Lower chest: Lung bases are clear. Hepatobiliary: Mild hepatic steatosis. No suspicious/enhancing hepatic lesions. Layering gallbladder sludge (series 7/image 31), without associated inflammatory changes. No intrahepatic or extrahepatic ductal dilatation. Pancreas: Within normal limits. Spleen: Within normal limits. Adrenals/Urinary Tract: Adrenal glands are within normal limits. Status post right nephrectomy. No abnormal soft tissue in the surgical bed. Left kidney is within normal limits.  No hydronephrosis. Bladder is within normal limits. Stomach/Bowel: Stomach is within normal limits. No evidence of bowel obstruction. Appendix is not discretely visualized. No colonic wall thickening or inflammatory changes. Vascular/Lymphatic: No evidence of abdominal aortic aneurysm. Atherosclerotic calcifications of the abdominal aorta and branch vessels, although vessels remain patent. No suspicious abdominopelvic lymphadenopathy. Reproductive: Status post hysterectomy. No adnexal masses. Other: No abdominopelvic ascites. Large right lateral spigelian hernia containing numerous loops of small bowel and the ascending colon/cecum, chronic but mildly progressive. Musculoskeletal: Mild degenerative changes of the  lower thoracic spine. IMPRESSION: Status post right nephrectomy. No evidence of recurrent or metastatic disease. Large right lateral Spigelian hernia containing numerous loops of small bowel and the ascending colon/cecum, chronic but mildly progressive. No evidence of bowel obstruction. Electronically Signed   By: Zadie Herter M.D.   On: 09/01/2023 23:02     ASSESSMENT AND PLAN: This is a very  pleasant 57 years old white female recently diagnosed with a stage III (T3a, N0, M0) clear-cell renal cell carcinoma of the right kidney diagnosed in July 2024 status post robotic assisted right radical nephrectomy and the final pathology was consistent with clear-cell renal cell carcinoma with focal rhabdoid features, nuclear grade 4 with tumor size of 10.0 cm and tumor extends into the renal sinus fat and renal vein (pT3a).  The ureteral, vascular and all margins of resection were negative for tumor.  She is currently undergoing adjuvant treatment with immunotherapy with Keytruda  200 Mg IV every 3 weeks.  She started the first cycle of her treatment on 02/13/2023.  Status post 9 cycles.   The patient has been tolerating this treatment fairly well.    Stage III clear cell renal cell carcinoma Stage III clear cell renal cell carcinoma of the right kidney, diagnosed in July 2024. Status post right radical nephrectomy. Currently undergoing adjuvant immunotherapy with pembrolizumab  200 mg IV every three weeks. Completed nine cycles. Recent CT scan of the abdomen and pelvis shows no evidence of recurrent or metastatic disease. Lab work is stable. The plan is to continue immunotherapy unless significant issues arise with the hernia, in which case surgical intervention may be prioritized. - Continue pembrolizumab  200 mg IV every three weeks for seven more cycles - Schedule follow-up CT scan after completion of immunotherapy  Large right lateral Spigelian hernia Large right lateral Spigelian hernia containing  numerous loops of small bowel and the ascending colon/cecum. Chronic but mildly progressive. She reports significant discomfort and has been evaluated by a surgeon who recommended referral to [facility] for surgical repair due to the size and complexity of the hernia. The decision to delay surgery until after immunotherapy is based on the current lack of obstruction and the desire to complete cancer treatment first. However, if symptoms worsen or obstruction occurs, earlier surgical intervention will be considered. - Follow up with surgeon on May 7 for further evaluation and planning for hernia repair - Consider timing of hernia repair after completion of immunotherapy unless symptoms worsen or obstruction occurs   The patient was advised to call immediately if she has any concerning symptoms in the interval. The patient voices understanding of current disease status and treatment options and is in agreement with the current care plan.  All questions were answered. The patient knows to call the clinic with any problems, questions or concerns. We can certainly see the patient much sooner if necessary. The total time spent in the appointment was 30 minutes.  Disclaimer: This note was dictated with voice recognition software. Similar sounding words can inadvertently be transcribed and may not be corrected upon review.

## 2023-09-18 ENCOUNTER — Encounter (HOSPITAL_BASED_OUTPATIENT_CLINIC_OR_DEPARTMENT_OTHER): Payer: Self-pay | Admitting: Physical Therapy

## 2023-09-18 ENCOUNTER — Other Ambulatory Visit

## 2023-09-18 ENCOUNTER — Ambulatory Visit (HOSPITAL_BASED_OUTPATIENT_CLINIC_OR_DEPARTMENT_OTHER): Admitting: Physical Therapy

## 2023-09-18 DIAGNOSIS — R262 Difficulty in walking, not elsewhere classified: Secondary | ICD-10-CM

## 2023-09-18 DIAGNOSIS — M6281 Muscle weakness (generalized): Secondary | ICD-10-CM

## 2023-09-18 LAB — T4: T4, Total: 6.4 ug/dL (ref 4.5–12.0)

## 2023-09-18 NOTE — Therapy (Signed)
 OUTPATIENT PHYSICAL THERAPY TREATMENT   Patient Name: Christina Cameron MRN: [id number] DOB:Feb 07, 1966, 58 y.o., female Today's Date: 09/18/2023  END OF SESSION:  PT End of Session - 09/18/23 1101     Visit Number 4    Number of Visits 16    Date for PT Re-Evaluation 10/06/23    Authorization Type UHC dual complete    PT Start Time 1015    PT Stop Time 1100    PT Time Calculation (min) 45 min    Activity Tolerance Patient tolerated treatment well    Behavior During Therapy WFL for tasks assessed/performed              Past Medical History:  Diagnosis Date   Arm vein blood clot, unspecified laterality    around 20 years ago as of 08/10/21, Patient doesn't remember what caused it. She was not put on blood thinners.   Arthritis    right knee   Asthma    Cancer (HCC)    cervical 1989   Cancer of kidney (HCC)    Chest pain    a. normal cors by cath in 11/2014 / 03/02/2020 nuclear stress test demonstrated no perfusion defects consistent with prior infart or current ischemia. Normal study.   Chronic diastolic (congestive) heart failure (HCC)    09/27/20 Echocardiogram in Epic showed LVEF 60 to 65%.   Complication of anesthesia    post-operative nausea & vomiting   COVID-19    05/29/19 & 05/2020 Covid infections   Depression    Diabetes mellitus, type 2 (HCC)    Dysrhythmia    heart palpitations   GERD (gastroesophageal reflux disease)    H/O cardiovascular stress test 03/02/2020   03/02/2020 Nuclear stress test in Epic showed no perusion defects consistent with prior infarct or ischemia - normal study.   Headache    migraines   High cholesterol    pt takes Crestor    History of cardiac radiofrequency ablation    Hypertension    Last cardiology office visit, 10/22/20 with Mohammed Andrew, NP ( see note in Epic) as of 08/09/21.   Morbid (severe) obesity due to excess calories (HCC)    Neuromuscular disorder (HCC)    severe neuropathy in feet   Osteoarthritis of knee, unspecified     Palpitations    PONV (postoperative nausea and vomiting)    Post-COVID syndrome 05/2019   Pt experienced chest tightness, sob, palpitations & dizziness after Covid infection. See 04/2020 note from Mohammed Andrew, NP in [place].   PTSD (post-traumatic stress disorder)    Raynaud's syndrome    Sleep apnea 09/24/2020   sleep study indicated moderate sleep apea   SVT (supraventricular tachycardia) (HCC)    a. s/p ablation by Dr. Carolynne Citron in 2006.   Wears glasses    prescripton reading glasses   Past Surgical History:  Procedure Laterality Date   CARDIAC CATHETERIZATION N/A 12/14/2014   Procedure: Right/Left Heart Cath and Coronary Angiography;  Surgeon: Arnoldo Lapping, MD;  Location: [facility] INVASIVE CV LAB;  Service: Cardiovascular;  Laterality: N/A;   CARDIAC ELECTROPHYSIOLOGY STUDY AND ABLATION     around 2006, Patient states that heart rate was up to 180 bpm.   CESAREAN SECTION     x2 1990, 2005   CYSTOSCOPY  08/17/2021   Procedure: CYSTOSCOPY;  Surgeon: Matt Song, MD;  Location: [facility];  Service: Gynecology;;   ENDOMETRIAL ABLATION     HYSTEROSCOPY WITH D & C N/A 03/11/2021   Procedure: DILATATION AND  CURETTAGE /HYSTEROSCOPY;  Surgeon: Luan Rumpf, MD;  Location: [facility] OR;  Service: Gynecology;  Laterality: N/A;   OPERATIVE ULTRASOUND N/A 03/11/2021   Procedure: OPERATIVE ULTRASOUND;  Surgeon: Luan Rumpf, MD;  [facility] OR;  Service: Gynecology;  Laterality: N/A;  ultrasound guidance needed   ROBOT ASSISTED LAPAROSCOPIC NEPHRECTOMY Right 12/25/2022   Procedure: XI ROBOTIC ASSISTED LAPAROSCOPIC NEPHRECTOMY;  Surgeon: Marco Severs, MD;  Location: [facility];  Service: Urology;  Laterality: Right;   ROBOTIC ASSISTED LAPAROSCOPIC HYSTERECTOMY AND SALPINGECTOMY Bilateral 08/17/2021   Procedure: XI ROBOTIC ASSISTED LAPAROSCOPIC HYSTERECTOMY AND  BILATERAL SALPINGECTOMY AND OOPHERETOMY;  Surgeon: Matt Song, MD;  Location: [facility];   Service: Gynecology;  Laterality: Bilateral;   Patient Active Problem List   Diagnosis Date Noted   Encounter for antineoplastic immunotherapy 04/11/2023   Primary renal cell carcinoma of right kidney (HCC) 01/30/2023   History of right radical nephrectomy 12/28/2022   Right renal mass 12/25/2022   Cardiac chest pain 05/01/2022   Moderate obstructive sleep apnea 01/23/2022   Heart failure with preserved ejection fraction (HCC) 09/19/2021   Abnormal vaginal bleeding in postmenopausal patient 08/17/2021   Paroxysmal SVT (supraventricular tachycardia) (HCC) 09/17/2019   Upper airway cough syndrome vs atypical asthma/ cough variant  09/17/2019   Anxiety 09/15/2019   Essential hypertension, benign 05/07/2019   Chronic diastolic (congestive) heart failure (HCC)    HLD (hyperlipidemia)    Morbid (severe) obesity due to excess calories (HCC)    Osteoarthritis of knee, unspecified    Palpitations    Somnolence    Type 2 diabetes mellitus with hyperglycemia (HCC)    Raynaud's syndrome    DOE (dyspnea on exertion)    Precordial chest pain 09/08/2014     REFERRING PROVIDER:  Junie Olds, MD     REFERRING DIAG:  K43.2 (ICD-10-CM) - Hernia, incisional  E66.01 (ICD-10-CM) - Morbid obesity (HCC)  Z68.41 (ICD-10-CM) - Body mass index 40.0-44.9, adult (HCC)  Weight loss prior to hernia surgery  Rationale for Evaluation and Treatment: Rehabilitation  THERAPY DIAG:  Muscle weakness (generalized)  Difficulty in walking, not elsewhere classified  ONSET DATE: kidney removed July 2024 and then hernia developed quickly.    SUBJECTIVE:                                                                                                                                                                                           SUBJECTIVE STATEMENT: Pt reports she will have to wait after all of her chemo treatment before she can have hernia surgery (~ 7 months).  POOL ACCESS:  She has  pool at her appt  complex but may consider joining [employer] in [place]  PERTINENT HISTORY:  Currently in tx for CA  PAIN:  Are you having pain? Yes: NPRS scale: 4/10 Pain location: across abdomen on R Pain description: pressure, affects bowels, hips are sore Aggravating factors: standing/walking Relieving factors: rest  PRECAUTIONS:  None  RED FLAGS: None   WEIGHT BEARING RESTRICTIONS:  No  FALLS:  Has patient fallen in last 6 months? No  LIVING ENVIRONMENT: Alone but daughter is local- student at [employer]  OCCUPATION:  On disability  PLOF:  Independent  PATIENT GOALS:  Prep for surgery    OBJECTIVE:  Note: Objective measures were completed at Evaluation unless otherwise noted.  PATIENT SURVEYS:  Patient-Specific Activity Scoring Scheme  "0" represents "unable to perform." "10" represents "able to perform at prior level. 0 1 2 3 4 5 6 7 8 9  10 (Date and Score)   Activity Eval 3/10    1. Getting out of bed  4    2. Getting out of tub  3    3. Walking for ADLs 5   4.    5.    Score 12    Total score = sum of the activity scores/number of activities Minimum detectable change (90%CI) for average score = 2 points Minimum detectable change (90%CI) for single activity score = 3 points     COGNITIVE STATUS: Within functional limits for tasks assessed   SENSATION: Bilateral neuropathy- numb in toes and hand, tingling and burning in feet  EDEMA:  Yes: pt reports bilat LE edema  POSTURE:  Hernia displaced to right lower abdominal quadrant  HAND DOMINANCE:  Right  GAIT: Comments: Rt trendelenburg, Rt LE ER   OBJECTIVE: 3/11: unable to stand from chair without bilateral UE  Trunk sidebend in gait, antalgic on right                                                                                                                              TREATMENT DATE:  [facility] Adult PT Treatment:                                                DATE: 09/06/23 Pt  seen for aquatic therapy today.  Treatment took place in water  3.5-4.75 ft in depth at the [facility] pool. Temp of water  was 91.  Pt entered/exited the pool via stairs independently with bilat rail.  - unsupported walking forward/ backward, 4 laps 4.17ft - unsupported side stepping, with arm addct/ abdct -> with rainbow hand floats x 2 laps - farmer carry while marching forward/ backward with bilat then unilateral yellow - TrA set with 1/2 -full hollow noodle pull down to thighs wide stance then staggered stances x 20 - UE on wall:  hip add/abd 2 x 10 ; hip flexion /extension  x 10;   toe/heel raises x 20;  -  marching backward/ forward with row motion with rainbow hand floats  - straddling noodle, UE on corner: cycling, cross country ski, hip abdct/ addct  . Able to cycle across pool x 2 with good core engagement focus on balancing on noodle - return to walking forward /backward   Pt requires the buoyancy and hydrostatic pressure of water  for support, and to offload joints by unweighting joint load by at least 50 % in navel deep water  and by at least 75-80% in chest to neck deep water .  Viscosity of the water  is needed for resistance of strengthening. Water  current perturbations provides challenge to standing balance requiring increased core activation.      PATIENT EDUCATION:  Education details: intro to aquatic therapy;  exercise form/rationale Person educated: Patient Education method: Explanation, Demonstration, Tactile cues, Verbal cues Education comprehension: verbalized understanding, returned demonstration, verbal cues required, tactile cues required, and needs further education  HOME EXERCISE PROGRAM: [id number]   ASSESSMENT:  CLINICAL IMPRESSION: Pt reports good reduction in abd pain with submersion using the hydrostatic pressure to compress hernia.  She does have some difficulty with abdominal bracing due to hernia and reports odd pressure about area when trying to  engage.  She will be delayed in surgical repair until after  chemo treatments end.  She does have access to pool [facility] as well as home/apt.  She is encouraged to go to [facility] in between aquatic therapy to add to benefit of properties of water  to manage abdominal pain.      From initial evaluation:  Patient is a 58 y.o. F who was seen today for physical therapy evaluation and treatment for prehab prior to surgical intervention for incisional hernia in right lower abdominal quadrant. Patient has significant discomfort in knees and hips with minimal ability to engage abdominal wall. Pt will benefit from skilled, aquatic therapy to reduce burden of gravity and uneven distribution of weight to improve functional strength in preparation for surgery.     REHAB POTENTIAL: Fair prehab  CLINICAL DECISION MAKING: Stable/uncomplicated  EVALUATION COMPLEXITY: Low   GOALS: Goals reviewed with patient? Yes  SHORT TERM GOALS: Target date: 08/25/23  Verbalize ability to utilize glut set throughout the day Baseline: Goal status: INITIAL  2.  Good form presented for stretches Baseline:  Goal status: INITIAL  3.  Find a good working level in aquatic exercises Baseline:  Goal status: INITIAL   LONG TERM GOALS: Target date: POC date  Verbalize readiness for surgery regarding MSK strength Baseline:  Goal status: INITIAL  2.  PSFS to improve by [facility] Baseline:  Goal status: INITIAL  3.  Minimal use of UEs for standing from a chair.  Baseline:  Goal status: INITIAL     PLAN:  PT FREQUENCY: 1-2x/week  PT DURATION: POC date  PLANNED INTERVENTIONS: 97164- PT Re-evaluation, 97110-Therapeutic exercises, 97530- Therapeutic activity, 97112- Neuromuscular re-education, 97535- Self Care, 95638- Manual therapy, [phone]- Gait training, [phone]- Aquatic Therapy, Patient/Family education, Balance training, Stair training, Taping, Joint mobilization, Spinal mobilization, Scar mobilization, Cryotherapy,  and Moist heat.  PLAN FOR NEXT SESSION: ACTIVE CA*, aquatics  Adriana Hopping [place]) Ysabelle Goodroe MPT 09/18/23 11:09 AM [facility] [address] [place], [place], [address] Phone: [phone]   Fax:  [phone]

## 2023-09-20 ENCOUNTER — Ambulatory Visit (HOSPITAL_BASED_OUTPATIENT_CLINIC_OR_DEPARTMENT_OTHER): Admitting: Physical Therapy

## 2023-09-20 ENCOUNTER — Encounter (HOSPITAL_BASED_OUTPATIENT_CLINIC_OR_DEPARTMENT_OTHER): Payer: Self-pay | Admitting: Physical Therapy

## 2023-09-20 DIAGNOSIS — M6281 Muscle weakness (generalized): Secondary | ICD-10-CM | POA: Diagnosis not present

## 2023-09-20 DIAGNOSIS — R262 Difficulty in walking, not elsewhere classified: Secondary | ICD-10-CM | POA: Diagnosis not present

## 2023-09-20 NOTE — Therapy (Signed)
 OUTPATIENT PHYSICAL THERAPY TREATMENT   Patient Name: Christina Cameron MRN: [id number] DOB:03-06-1966, 58 y.o., female Today's Date: 09/20/2023  END OF SESSION:  PT End of Session - 09/20/23 1030     Visit Number 5    Number of Visits 16    Date for PT Re-Evaluation 10/06/23    Authorization Type UHC dual complete    PT Start Time 1017    PT Stop Time 1058    PT Time Calculation (min) 41 min    Activity Tolerance Patient tolerated treatment well    Behavior During Therapy WFL for tasks assessed/performed              Past Medical History:  Diagnosis Date   Arm vein blood clot, unspecified laterality    around 20 years ago as of 08/10/21, Patient doesn't remember what caused it. She was not put on blood thinners.   Arthritis    right knee   Asthma    Cancer (HCC)    cervical 1989   Cancer of kidney (HCC)    Chest pain    a. normal cors by cath in 11/2014 / 03/02/2020 nuclear stress test demonstrated no perfusion defects consistent with prior infart or current ischemia. Normal study.   Chronic diastolic (congestive) heart failure (HCC)    09/27/20 Echocardiogram in Epic showed LVEF 60 to 65%.   Complication of anesthesia    post-operative nausea & vomiting   COVID-19    05/29/19 & 05/2020 Covid infections   Depression    Diabetes mellitus, type 2 (HCC)    Dysrhythmia    heart palpitations   GERD (gastroesophageal reflux disease)    H/O cardiovascular stress test 03/02/2020   03/02/2020 Nuclear stress test in Epic showed no perusion defects consistent with prior infarct or ischemia - normal study.   Headache    migraines   High cholesterol    pt takes Crestor    History of cardiac radiofrequency ablation    Hypertension    Last cardiology office visit, 10/22/20 with Mohammed Andrew, NP ( see note in Epic) as of 08/09/21.   Morbid (severe) obesity due to excess calories (HCC)    Neuromuscular disorder (HCC)    severe neuropathy in feet   Osteoarthritis of knee, unspecified     Palpitations    PONV (postoperative nausea and vomiting)    Post-COVID syndrome 05/2019   Pt experienced chest tightness, sob, palpitations & dizziness after Covid infection. See 04/2020 note from Mohammed Andrew, NP in [place].   PTSD (post-traumatic stress disorder)    Raynaud's syndrome    Sleep apnea 09/24/2020   sleep study indicated moderate sleep apea   SVT (supraventricular tachycardia) (HCC)    a. s/p ablation by Dr. Carolynne Citron in 2006.   Wears glasses    prescripton reading glasses   Past Surgical History:  Procedure Laterality Date   CARDIAC CATHETERIZATION N/A 12/14/2014   Procedure: Right/Left Heart Cath and Coronary Angiography;  Surgeon: Arnoldo Lapping, MD;  Location: [facility] INVASIVE CV LAB;  Service: Cardiovascular;  Laterality: N/A;   CARDIAC ELECTROPHYSIOLOGY STUDY AND ABLATION     around 2006, Patient states that heart rate was up to 180 bpm.   CESAREAN SECTION     x2 1990, 2005   CYSTOSCOPY  08/17/2021   Procedure: CYSTOSCOPY;  Surgeon: Matt Song, MD;  Location: [facility] [place];  Service: Gynecology;;   ENDOMETRIAL ABLATION     HYSTEROSCOPY WITH D & C N/A 03/11/2021   Procedure: DILATATION AND  CURETTAGE /HYSTEROSCOPY;  Surgeon: Luan Rumpf, MD;  Location: [facility] OR;  Service: Gynecology;  Laterality: N/A;   OPERATIVE ULTRASOUND N/A 03/11/2021   Procedure: OPERATIVE ULTRASOUND;  Surgeon: Luan Rumpf, MD;  Location: [facility] OR;  Service: Gynecology;  Laterality: N/A;  ultrasound guidance needed   ROBOT ASSISTED LAPAROSCOPIC NEPHRECTOMY Right 12/25/2022   Procedure: XI ROBOTIC ASSISTED LAPAROSCOPIC NEPHRECTOMY;  Surgeon: Marco Severs, MD;  Location: [facility];  Service: Urology;  Laterality: Right;   ROBOTIC ASSISTED LAPAROSCOPIC HYSTERECTOMY AND SALPINGECTOMY Bilateral 08/17/2021   Procedure: XI ROBOTIC ASSISTED LAPAROSCOPIC HYSTERECTOMY AND  BILATERAL SALPINGECTOMY AND OOPHERETOMY;  Surgeon: Matt Song, MD;  Location: [facility];   Service: Gynecology;  Laterality: Bilateral;   Patient Active Problem List   Diagnosis Date Noted   Encounter for antineoplastic immunotherapy 04/11/2023   Primary renal cell carcinoma of right kidney (HCC) 01/30/2023   History of right radical nephrectomy 12/28/2022   Right renal mass 12/25/2022   Cardiac chest pain 05/01/2022   Moderate obstructive sleep apnea 01/23/2022   Heart failure with preserved ejection fraction (HCC) 09/19/2021   Abnormal vaginal bleeding in postmenopausal patient 08/17/2021   Paroxysmal SVT (supraventricular tachycardia) (HCC) 09/17/2019   Upper airway cough syndrome vs atypical asthma/ cough variant  09/17/2019   Anxiety 09/15/2019   Essential hypertension, benign 05/07/2019   Chronic diastolic (congestive) heart failure (HCC)    HLD (hyperlipidemia)    Morbid (severe) obesity due to excess calories (HCC)    Osteoarthritis of knee, unspecified    Palpitations    Somnolence    Type 2 diabetes mellitus with hyperglycemia (HCC)    Raynaud's syndrome    DOE (dyspnea on exertion)    Precordial chest pain 09/08/2014     REFERRING PROVIDER:  Junie Olds, MD     REFERRING DIAG:  K43.2 (ICD-10-CM) - Hernia, incisional  E66.01 (ICD-10-CM) - Morbid obesity (HCC)  Z68.41 (ICD-10-CM) - Body mass index 40.0-44.9, adult (HCC)  Weight loss prior to hernia surgery  Rationale for Evaluation and Treatment: Rehabilitation  THERAPY DIAG:  Muscle weakness (generalized)  Difficulty in walking, not elsewhere classified  ONSET DATE: kidney removed July 2024 and then hernia developed quickly.    SUBJECTIVE:                                                                                                                                                                                           SUBJECTIVE STATEMENT: Pt reports she had near-fall while stepping over make-shift baby gait in kitchen and skinned her L forearm.    Pt will have to wait after all  of her chemo treatment  before she can have hernia surgery (~ 7 months).  POOL ACCESS:  She has pool at her appt complex but may consider joining [employer] in [place]  PERTINENT HISTORY:  Currently in tx for CA  PAIN:  Are you having pain? Yes: NPRS scale: 2/10 Pain location: across abdomen on R Pain description: pressure, affects bowels, hips are sore Aggravating factors: standing/walking Relieving factors: rest  PRECAUTIONS:  None  RED FLAGS: None   WEIGHT BEARING RESTRICTIONS:  No  FALLS:  Has patient fallen in last 6 months? No  LIVING ENVIRONMENT: Alone but daughter is local- student at [employer]  OCCUPATION:  On disability  PLOF:  Independent  PATIENT GOALS:  Prep for surgery    OBJECTIVE:  Note: Objective measures were completed at Evaluation unless otherwise noted.  PATIENT SURVEYS:  Patient-Specific Activity Scoring Scheme  "0" represents "unable to perform." "10" represents "able to perform at prior level. 0 1 2 3 4 5 6 7 8 9  10 (Date and Score)   Activity Eval 3/10    1. Getting out of bed  4    2. Getting out of tub  3    3. Walking for ADLs 5   4.    5.    Score 12    Total score = sum of the activity scores/number of activities Minimum detectable change (90%CI) for average score = 2 points Minimum detectable change (90%CI) for single activity score = 3 points     COGNITIVE STATUS: Within functional limits for tasks assessed   SENSATION: Bilateral neuropathy- numb in toes and hand, tingling and burning in feet  EDEMA:  Yes: pt reports bilat LE edema  POSTURE:  Hernia displaced to right lower abdominal quadrant  HAND DOMINANCE:  Right  GAIT: Comments: Rt trendelenburg, Rt LE ER   OBJECTIVE: 3/11: unable to stand from chair without bilateral UE  Trunk sidebend in gait, antalgic on right                                                                                                                              TREATMENT  DATE:  [facility] Adult PT Treatment:                                                DATE: 09/20/23 Pt seen for aquatic therapy today.  Treatment took place in water  3.5-4.75 ft in depth at the [facility] pool. Temp of water  was 91.  Pt entered/exited the pool via stairs independently with bilat rail.  - applied tegaderm to R great toe and L 2nd toe at nailbed;  applied tegaderm to L forarm to cover a non-waterproof bandaid.   - unsupported walking forward/ backward, 4 laps 4.41ft - unsupported side stepping, with arm addct/ abdct -> with rainbow hand floats x 2 laps -  UE on rainbow hand floats:  3 way LE kick x 10 each LE - no UE support:  tandem gait forward/ backward  - tandem stance, hands out of water , eyes closed x 8 sec - single leg squats x 10 each LE  - TrA set with single and bilat rainbow hand float pull down to thighs  -  marching backward/ forward with row motion with rainbow hand floats  - straddling noodle,without UE support cycling across pool x 2 with good core engagement    PATIENT EDUCATION:  Education details: intro to aquatic therapy;  exercise form/rationale Person educated: Patient Education method: Explanation, Demonstration, Tactile cues, Verbal cues Education comprehension: verbalized understanding, returned demonstration, verbal cues required, tactile cues required, and needs further education  HOME EXERCISE PROGRAM: [id number]   ASSESSMENT:  CLINICAL IMPRESSION: Pt reports good reduction in abd pain with submersion using the hydrostatic pressure to compress hernia.  She is not yet member of [employer], but is considering it. She is encouraged to go to [facility] in between aquatic therapy to add to benefit of properties of water  to manage abdominal pain. Pt has met STG 1 and 3, therapist to ck STG 2 next visit.       From initial evaluation:  Patient is a 58 y.o. F who was seen today for physical therapy evaluation and treatment for prehab prior to surgical  intervention for incisional hernia in right lower abdominal quadrant. Patient has significant discomfort in knees and hips with minimal ability to engage abdominal wall. Pt will benefit from skilled, aquatic therapy to reduce burden of gravity and uneven distribution of weight to improve functional strength in preparation for surgery.     REHAB POTENTIAL: Fair prehab  CLINICAL DECISION MAKING: Stable/uncomplicated  EVALUATION COMPLEXITY: Low   GOALS: Goals reviewed with patient? Yes  SHORT TERM GOALS: Target date: 08/25/23  Verbalize ability to utilize glut set throughout the day Baseline: Goal status: MET - 09/20/23  2.  Good form presented for stretches Baseline:  Goal status: INITIAL  3.  Find a good working level in aquatic exercises Baseline:  Goal status: MET - 09/20/23   LONG TERM GOALS: Target date: POC date  Verbalize readiness for surgery regarding MSK strength Baseline:  Goal status: INITIAL  2.  PSFS to improve by [facility] Baseline:  Goal status: INITIAL  3.  Minimal use of UEs for standing from a chair.  Baseline:  Goal status: INITIAL     PLAN:  PT FREQUENCY: 1-2x/week  PT DURATION: POC date  PLANNED INTERVENTIONS: 97164- PT Re-evaluation, 97110-Therapeutic exercises, 97530- Therapeutic activity, 97112- Neuromuscular re-education, 97535- Self Care, 54098- Manual therapy, [phone]- Gait training, [phone]- Aquatic Therapy, Patient/Family education, Balance training, Stair training, Taping, Joint mobilization, Spinal mobilization, Scar mobilization, Cryotherapy, and Moist heat.  PLAN FOR NEXT SESSION: ACTIVE CA*, aquatics  [place], [place] 09/20/23 10:58 AM [facility] [address] [place], [place], [address] Phone: [phone]   Fax:  [phone]

## 2023-09-21 ENCOUNTER — Other Ambulatory Visit: Payer: Self-pay

## 2023-09-21 ENCOUNTER — Encounter: Payer: Self-pay | Admitting: Urology

## 2023-09-21 ENCOUNTER — Ambulatory Visit: Payer: 59 | Admitting: Urology

## 2023-09-21 ENCOUNTER — Emergency Department (HOSPITAL_COMMUNITY)

## 2023-09-21 ENCOUNTER — Emergency Department (HOSPITAL_COMMUNITY)
Admission: EM | Admit: 2023-09-21 | Discharge: 2023-09-21 | Disposition: A | Discharge status: Home / Self Care | Attending: Emergency Medicine | Admitting: Emergency Medicine

## 2023-09-21 ENCOUNTER — Encounter: Payer: Self-pay | Admitting: Internal Medicine

## 2023-09-21 VITALS — BP 130/77 | HR 85

## 2023-09-21 DIAGNOSIS — C641 Malignant neoplasm of right kidney, except renal pelvis: Secondary | ICD-10-CM | POA: Diagnosis not present

## 2023-09-21 DIAGNOSIS — R7309 Other abnormal glucose: Secondary | ICD-10-CM | POA: Diagnosis not present

## 2023-09-21 DIAGNOSIS — M79601 Pain in right arm: Secondary | ICD-10-CM | POA: Diagnosis not present

## 2023-09-21 DIAGNOSIS — M25551 Pain in right hip: Secondary | ICD-10-CM | POA: Diagnosis not present

## 2023-09-21 DIAGNOSIS — M79641 Pain in right hand: Secondary | ICD-10-CM | POA: Insufficient documentation

## 2023-09-21 DIAGNOSIS — R600 Localized edema: Secondary | ICD-10-CM | POA: Diagnosis not present

## 2023-09-21 LAB — URINALYSIS, ROUTINE W REFLEX MICROSCOPIC
Bilirubin, UA: NEGATIVE
Glucose, UA: NEGATIVE
Ketones, UA: NEGATIVE
Nitrite, UA: NEGATIVE
Protein,UA: NEGATIVE
RBC, UA: NEGATIVE
Specific Gravity, UA: 1.025 (ref 1.005–1.030)
Urobilinogen, Ur: 0.2 mg/dL (ref 0.2–1.0)
pH, UA: 6 (ref 5.0–7.5)

## 2023-09-21 LAB — MICROSCOPIC EXAMINATION

## 2023-09-21 LAB — CBG MONITORING, ED: Glucose-Capillary: 145 mg/dL — ABNORMAL HIGH (ref 70–99)

## 2023-09-21 MED ORDER — PREDNISONE 10 MG PO TABS: 40.0000 mg | ORAL_TABLET | Freq: Every day | ORAL | 0 refills | Status: AC

## 2023-09-21 NOTE — Progress Notes (Signed)
 09/21/2023 12:00 PM   Christina Cameron [birth year] [id number]  Referring provider: Practice, [facility] [address] [place],  [place] [address]  Followup [facility]   HPI: Ms Flory is a 57yo here for followup fro [facility] s/p nephrectomy. She is currently on keytruda . Ct shows no evidence of metastatic disease. Creatinine 1.06. She has new right lower extremity swelling and right upper extremity swelling with pain since last night.    PMH: Past Medical History:  Diagnosis Date   Arm vein blood clot, unspecified laterality    around 20 years ago as of 08/10/21, Patient doesn't remember what caused it. She was not put on blood thinners.   Arthritis    right knee   Asthma    Cancer (HCC)    cervical 1989   Cancer of kidney (HCC)    Chest pain    a. normal cors by cath in 11/2014 / 03/02/2020 nuclear stress test demonstrated no perfusion defects consistent with prior infart or current ischemia. Normal study.   Chronic diastolic (congestive) heart failure (HCC)    09/27/20 Echocardiogram in Epic showed LVEF 60 to 65%.   Complication of anesthesia    post-operative nausea & vomiting   COVID-19    05/29/19 & 05/2020 Covid infections   Depression    Diabetes mellitus, type 2 (HCC)    Dysrhythmia    heart palpitations   GERD (gastroesophageal reflux disease)    H/O cardiovascular stress test 03/02/2020   03/02/2020 Nuclear stress test in Epic showed no perusion defects consistent with prior infarct or ischemia - normal study.   Headache    migraines   High cholesterol    pt takes Crestor    History of cardiac radiofrequency ablation    Hypertension    Last cardiology office visit, 10/22/20 with Mohammed Andrew, NP ( see note in Epic) as of 08/09/21.   Morbid (severe) obesity due to excess calories (HCC)    Neuromuscular disorder (HCC)    severe neuropathy in feet   Osteoarthritis of knee, unspecified    Palpitations    PONV (postoperative nausea and vomiting)    Post-COVID syndrome 05/2019   Pt  experienced chest tightness, sob, palpitations & dizziness after Covid infection. See 04/2020 note from Mohammed Andrew, NP in [place].   PTSD (post-traumatic stress disorder)    Raynaud's syndrome    Sleep apnea 09/24/2020   sleep study indicated moderate sleep apea   SVT (supraventricular tachycardia) (HCC)    a. s/p ablation by Dr. Carolynne Citron in 2006.   Wears glasses    prescripton reading glasses    Surgical History: Past Surgical History:  Procedure Laterality Date   CARDIAC CATHETERIZATION N/A 12/14/2014   Procedure: Right/Left Heart Cath and Coronary Angiography;  Surgeon: Arnoldo Lapping, MD;  Location: [facility] INVASIVE CV LAB;  Service: Cardiovascular;  Laterality: N/A;   CARDIAC ELECTROPHYSIOLOGY STUDY AND ABLATION     around 2006, Patient states that heart rate was up to 180 bpm.   CESAREAN SECTION     x2 1990, 2005   CYSTOSCOPY  08/17/2021   Procedure: CYSTOSCOPY;  Surgeon: Matt Song, MD;  Location: [facility];  Service: Gynecology;;   ENDOMETRIAL ABLATION     HYSTEROSCOPY WITH D & C N/A 03/11/2021   Procedure: DILATATION AND CURETTAGE /HYSTEROSCOPY;  Surgeon: Luan Rumpf, MD;  Location: [facility] OR;  Service: Gynecology;  Laterality: N/A;   OPERATIVE ULTRASOUND N/A 03/11/2021   Procedure: OPERATIVE ULTRASOUND;  Surgeon: Luan Rumpf, MD;  Location: [facility]  OR;  Service: Gynecology;  Laterality: N/A;  ultrasound guidance needed   ROBOT ASSISTED LAPAROSCOPIC NEPHRECTOMY Right 12/25/2022   Procedure: XI ROBOTIC ASSISTED LAPAROSCOPIC NEPHRECTOMY;  Surgeon: Marco Severs, MD;  Location: [facility] ORS;  Service: Urology;  Laterality: Right;   ROBOTIC ASSISTED LAPAROSCOPIC HYSTERECTOMY AND SALPINGECTOMY Bilateral 08/17/2021   Procedure: XI ROBOTIC ASSISTED LAPAROSCOPIC HYSTERECTOMY AND  BILATERAL SALPINGECTOMY AND OOPHERETOMY;  Surgeon: Matt Song, MD;  Location: [facility];  Service: Gynecology;  Laterality: Bilateral;    Home Medications:  Allergies as  of 09/21/2023   No Known Allergies      Medication List        Accurate as of September 21, 2023 12:00 PM. If you have any questions, ask your nurse or doctor.          STOP taking these medications    amoxicillin -clavulanate 875-125 MG tablet Commonly known as: AUGMENTIN    estradiol  0.5 MG tablet Commonly known as: ESTRACE    oxyCODONE -acetaminophen  5-325 MG tablet Commonly known as: Percocet       TAKE these medications    albuterol  108 (90 Base) MCG/ACT inhaler Commonly known as: VENTOLIN  HFA Inhale 1-2 puffs into the lungs every 6 (six) hours as needed for wheezing or shortness of breath.   Azelastine -Fluticasone  137-50 MCG/ACT Susp Place 1 spray into the nose every 12 (twelve) hours.   brexpiprazole  2 MG Tabs tablet Commonly known as: REXULTI  Take 2 mg by mouth daily.   buPROPion  150 MG 24 hr tablet Commonly known as: WELLBUTRIN  XL Take 150 mg by mouth every morning.   busPIRone  10 MG tablet Commonly known as: BUSPAR  Take 10 mg by mouth 2 (two) times daily.   cetirizine  10 MG tablet Commonly known as: ZYRTEC  Take 10 mg by mouth daily as needed for allergies.   cyclobenzaprine  5 MG tablet Commonly known as: FLEXERIL  Take 1 tablet (5 mg total) by mouth 3 (three) times daily as needed for muscle spasms. Do not drive or operate a heavy machinery after taking this medicine.   ezetimibe  10 MG tablet Commonly known as: ZETIA  Take 1 tablet (10 mg total) by mouth daily.   furosemide  20 MG tablet Commonly known as: LASIX  Take 1 tablet (20 mg total) by mouth daily. What changed:  when to take this reasons to take this   gabapentin  300 MG capsule Commonly known as: NEURONTIN  Take 1 capsule (300 mg total) by mouth 3 (three) times daily. Start at bedtime and slowly increase to 3x a day as needed to make sure no side effects like sedation. What changed: when to take this   hydrOXYzine  50 MG tablet Commonly known as: ATARAX  Take 100 mg by mouth at bedtime.    indapamide  2.5 MG tablet Commonly known as: LOZOL  Take 2.5 mg by mouth daily as needed (fluid).   isosorbide  mononitrate 30 MG 24 hr tablet Commonly known as: IMDUR  Take 1 tablet (30 mg total) by mouth daily.   lamoTRIgine  200 MG tablet Commonly known as: LAMICTAL  Take 200 mg by mouth daily.   metFORMIN  500 MG tablet Commonly known as: GLUCOPHAGE  Take 1,000 mg by mouth 2 (two) times daily with a meal.   methylPREDNISolone  4 MG Tbpk tablet Commonly known as: MEDROL  DOSEPAK Take as directed   metoprolol  tartrate 50 MG tablet Commonly known as: LOPRESSOR  Take 2 tablets (100 mg total) by mouth 2 (two) times daily.   mupirocin  ointment 2 % Commonly known as: BACTROBAN  Apply 1 Application topically 2 (two) times daily.  mupirocin  ointment 2 % Commonly known as: BACTROBAN  Apply 1 Application topically 2 (two) times daily.   nitroGLYCERIN  0.4 MG SL tablet Commonly known as: NITROSTAT  DISSOLVE ONE TABLET UNDER THE TONGUE EVERY 5 MINUTES AS NEEDED FOR CHEST PAIN.  DO NOT EXCEED A TOTAL OF 3 DOSES IN 15 MINUTES   Ozempic (2 MG/DOSE) 8 MG/3ML Sopn Generic drug: Semaglutide (2 MG/DOSE) Inject 2 mg into the skin every 7 (seven) days.   prochlorperazine  10 MG tablet Commonly known as: COMPAZINE  Take 1 tablet (10 mg total) by mouth every 6 (six) hours as needed for nausea or vomiting.   rosuvastatin  40 MG tablet Commonly known as: CRESTOR  Take 1 tablet (40 mg total) by mouth daily.   sertraline  100 MG tablet Commonly known as: ZOLOFT  Take 200 mg by mouth daily.   tizanidine  2 MG capsule Commonly known as: ZANAFLEX  Take 1 capsule (2 mg total) by mouth 3 (three) times daily.        Allergies: No Known Allergies  Family History: Family History  Problem Relation Age of Onset   Cancer Mother    Cancer Father    Parkinson's disease Father    Stroke Brother    Hypertension Brother    Cancer - Colon Brother     Social History:  reports that she has never smoked.  She has been exposed to tobacco smoke. She has never used smokeless tobacco. She reports current alcohol use. She reports that she does not use drugs.  ROS: All other review of systems were reviewed and are negative except what is noted above in HPI  Physical Exam: BP 130/77   Pulse 85   Constitutional:  Alert and oriented, No acute distress. HEENT: [place] AT, moist mucus membranes.  Trachea midline, no masses. Cardiovascular: No clubbing, cyanosis, or edema. Respiratory: Normal respiratory effort, no increased work of breathing. GI: Abdomen is soft, nontender, nondistended, no abdominal masses GU: No CVA tenderness.  Lymph: No cervical or inguinal lymphadenopathy. Skin: No rashes, bruises or suspicious lesions. Neurologic: Grossly intact, no focal deficits, moving all 4 extremities. Psychiatric: Normal mood and affect.  Laboratory Data: Lab Results  Component Value Date   WBC 8.6 09/17/2023   HGB 13.0 09/17/2023   HCT 37.1 09/17/2023   MCV 86.1 09/17/2023   PLT 205 09/17/2023    Lab Results  Component Value Date   CREATININE 1.06 (H) 09/17/2023    No results found for: "PSA"  No results found for: "TESTOSTERONE"  Lab Results  Component Value Date   HGBA1C 8.0 (H) 12/15/2022    Urinalysis    Component Value Date/Time   COLORURINE YELLOW 07/07/2018 0812   APPEARANCEUR Clear 08/09/2023 0900   LABSPEC 1.020 07/07/2018 0812   PHURINE 5.5 07/07/2018 0812   GLUCOSEU Negative 08/09/2023 0900   HGBUR NEGATIVE 07/07/2018 0812   BILIRUBINUR Negative 08/09/2023 0900   KETONESUR negative 11/28/2019 1455   KETONESUR TRACE (A) 07/07/2018 0812   PROTEINUR Negative 08/09/2023 0900   PROTEINUR NEGATIVE 07/07/2018 0812   UROBILINOGEN 0.2 11/28/2019 1455   UROBILINOGEN 0.2 04/28/2014 0331   NITRITE Negative 08/09/2023 0900   NITRITE NEGATIVE 07/07/2018 0812   LEUKOCYTESUR Negative 08/09/2023 0900    Lab Results  Component Value Date   LABMICR Comment 08/09/2023   WBCUA 0-5  11/08/2022   LABEPIT >10 (A) 11/08/2022   BACTERIA Moderate (A) 11/08/2022    Pertinent Imaging: CT 08/28/2023: Images reviewed and discussed with the patient  No results found for this or any previous visit.  No results found for this or any previous visit.  No results found for this or any previous visit.  No results found for this or any previous visit.  No results found for this or any previous visit.  No results found for this or any previous visit.  No results found for this or any previous visit.  No results found for this or any previous visit.   Assessment & Plan:    1. Renal cell carcinoma of right kidney (HCC) (Primary) Followup 3 months with CMP and CXR Patient instructed to go to the ER due to concern for DVT - Urinalysis, Routine w reflex microscopic   No follow-ups on file.  Johnie Nailer, MD  [facility] [place]

## 2023-09-21 NOTE — ED Triage Notes (Signed)
 Pt currently taking tx Ketruda for kidney cancer and stated that she woke up this morning with swelling to her right hand, pain through her right shoulder and pain in her right hip and leg.

## 2023-09-21 NOTE — ED Provider Notes (Signed)
 [place] EMERGENCY DEPARTMENT AT [facility] Provider Note   CSN: [id number] Arrival date & time: 09/21/23  1218     History  Chief Complaint  Patient presents with   Hand Pain   Leg Pain    Christina Cameron is a 58 y.o. female.  Patient is a 58 year old female who presents to the emergency department from her urologist office secondary to swelling and pain in her right hand and right arm.  She also notes that she has been experiencing pain and swelling along the lateral aspect of her right lower extremity.  Patient notes that she is experiencing increased pain with movement at the right hand.  She denies any associated changes in urination, dysuria, hematuria.  She denies any chest pain or shortness of breath.  She denies any associated numbness or paresthesias.   Hand Pain  Leg Pain      Home Medications Prior to Admission medications   Medication Sig Start Date End Date Taking? Authorizing Provider  albuterol  (VENTOLIN  HFA) 108 (90 Base) MCG/ACT inhaler Inhale 1-2 puffs into the lungs every 6 (six) hours as needed for wheezing or shortness of breath. 05/02/20   Avegno, Komlanvi S, FNP  Azelastine -Fluticasone  137-50 MCG/ACT SUSP Place 1 spray into the nose every 12 (twelve) hours. 07/28/21   Lanetta Pion, NP  brexpiprazole  (REXULTI ) 2 MG TABS tablet Take 2 mg by mouth daily.    [provider]  buPROPion  (WELLBUTRIN  XL) 150 MG 24 hr tablet Take 150 mg by mouth every morning.    [provider]  busPIRone  (BUSPAR ) 10 MG tablet Take 10 mg by mouth 2 (two) times daily. 10/19/20   [provider]  cetirizine  (ZYRTEC ) 10 MG tablet Take 10 mg by mouth daily as needed for allergies.    [provider]  cyclobenzaprine  (FLEXERIL ) 5 MG tablet Take 1 tablet (5 mg total) by mouth 3 (three) times daily as needed for muscle spasms. Do not drive or operate a heavy machinery after taking this medicine. 08/16/23   Gandhi, Safal, PA-C  ezetimibe   (ZETIA ) 10 MG tablet Take 1 tablet (10 mg total) by mouth daily. 10/14/21 12/13/22  Jann Melody, MD  furosemide  (LASIX ) 20 MG tablet Take 1 tablet (20 mg total) by mouth daily. Patient taking differently: Take 20 mg by mouth daily as needed for fluid or edema. 09/19/21   Jann Melody, MD  gabapentin  (NEURONTIN ) 300 MG capsule Take 1 capsule (300 mg total) by mouth 3 (three) times daily. Start at bedtime and slowly increase to 3x a day as needed to make sure no side effects like sedation. Patient taking differently: Take 300 mg by mouth at bedtime. Start at bedtime and slowly increase to 3x a day as needed to make sure no side effects like sedation. 03/07/22   Glory Larsen, MD  hydrOXYzine  (ATARAX ) 50 MG tablet Take 100 mg by mouth at bedtime.    [provider]  indapamide  (LOZOL ) 2.5 MG tablet Take 2.5 mg by mouth daily as needed (fluid). 12/07/20   [provider]  isosorbide  mononitrate (IMDUR ) 30 MG 24 hr tablet Take 1 tablet (30 mg total) by mouth daily. Patient not taking: Reported on 01/01/2023 11/08/22 02/06/23  Mallipeddi, Vishnu P, MD  lamoTRIgine  (LAMICTAL ) 200 MG tablet Take 200 mg by mouth daily.    [provider]  metFORMIN  (GLUCOPHAGE ) 500 MG tablet Take 1,000 mg by mouth 2 (two) times daily with a meal.    [provider]  methylPREDNISolone  (MEDROL  DOSEPAK) 4 MG TBPK tablet Take as directed 08/15/23   Gandhi, Safal, PA-C  metoprolol  tartrate (LOPRESSOR ) 50 MG tablet Take 2 tablets (100 mg total) by mouth 2 (two) times daily. 09/19/21   Jann Melody, MD  mupirocin  ointment (BACTROBAN ) 2 % Apply 1 Application topically 2 (two) times daily. 02/02/22   Charity Conch, DPM  mupirocin  ointment (BACTROBAN ) 2 % Apply 1 Application topically 2 (two) times daily. 05/22/23   Corbin Dess, PA-C  nitroGLYCERIN  (NITROSTAT ) 0.4 MG SL tablet DISSOLVE ONE TABLET UNDER THE TONGUE EVERY 5 MINUTES AS NEEDED FOR CHEST PAIN.  DO  NOT EXCEED A TOTAL OF 3 DOSES IN 15 MINUTES 09/26/22   Chandrasekhar, Mahesh A, MD  prochlorperazine  (COMPAZINE ) 10 MG tablet Take 1 tablet (10 mg total) by mouth every 6 (six) hours as needed for nausea or vomiting. 07/16/23   Heilingoetter, Cassandra L, PA-C  rosuvastatin  (CRESTOR ) 40 MG tablet Take 1 tablet (40 mg total) by mouth daily. 05/01/22 04/26/23  Mallipeddi, Vishnu P, MD  Semaglutide, 2 MG/DOSE, (OZEMPIC, 2 MG/DOSE,) 8 MG/3ML SOPN Inject 2 mg into the skin every 7 (seven) days.    [provider]  sertraline  (ZOLOFT ) 100 MG tablet Take 200 mg by mouth daily. 09/28/20   [provider]  tizanidine  (ZANAFLEX ) 2 MG capsule Take 1 capsule (2 mg total) by mouth 3 (three) times daily. 08/15/23   Gandhi, Safal, PA-C      Allergies    Patient has no known allergies.    Review of Systems   Review of Systems  Musculoskeletal:        Pain to right upper extremity and right lower extremity  All other systems reviewed and are negative.   Physical Exam Updated Vital Signs BP 137/85 (BP Location: Right Arm)   Pulse 80   Temp 98.8 F (37.1 C) (Oral)   Resp 18   Ht 5\' 6"  (1.676 m)   Wt 121.6 kg   SpO2 100%   BMI 43.27 kg/m  Physical Exam Vitals and nursing note reviewed.  Constitutional:      Appearance: Normal appearance.  HENT:     Head: Normocephalic and atraumatic.  Eyes:     Extraocular Movements: Extraocular movements intact.     Conjunctiva/sclera: Conjunctivae normal.     Pupils: Pupils are equal, round, and reactive to light.  Cardiovascular:     Rate and Rhythm: Normal rate and regular rhythm.     Pulses: Normal pulses.     Heart sounds: Normal heart sounds.  Pulmonary:     Effort: Pulmonary effort is normal. No respiratory distress.  Abdominal:     General: Abdomen is flat. Bowel sounds are normal.     Palpations: Abdomen is soft.  Musculoskeletal:        General: Normal range of motion.     Cervical back: Normal range of motion and neck supple.      Comments: Tender to palpation noted over the right hand, edema noted over right hand and right forearm, radial pulse 2+ in all upper extremities, cap refill less than 2 seconds distally, sensation intact throughout, no obvious deformity or bruising, no skin breakdown or ulceration, no lacerations or abrasions, tenderness palpation noted over the lateral aspect of the thigh of the right lower extremity, DP and PT pulses are 2+ distally, sensation intact distally, full range of motion noted throughout  Skin:    General: Skin is warm and dry.  Neurological:  General: No focal deficit present.     Mental Status: She is alert and oriented to person, place, and time. Mental status is at baseline.  Psychiatric:        Mood and Affect: Mood normal.        Behavior: Behavior normal.        Thought Content: Thought content normal.        Judgment: Judgment normal.     ED Results / Procedures / Treatments   Labs (all labs ordered are listed, but only abnormal results are displayed) Labs Reviewed - No data to display  EKG None  Radiology No results found.  Procedures Procedures    Medications Ordered in ED Medications - No data to display  ED Course/ Medical Decision Making/ A&P                                 Medical Decision Making Patient is doing very well at this time and is stable for discharge home.  Discussed with patient that venous duplexes were unremarked for any signs of DVT to the right upper right lower extremity.  Patient has no overlying erythema or warmth to the affected areas and do not suspect cellulitis, abscess summation or septic joint.  Patient has no indication for thoracic outlet syndrome.  Do not suspect PE at this point.  Remainder of physical exam was otherwise unremarkable.  Patient has had no recent falls or blunt trauma and do not suspect that x-rays are warranted at this time.  Symptoms may be secondary to her Keytruda .  The need for close follow-up with  her primary care doctor was discussed as well as strict return precautions for any new or worsening symptoms.  Will cover patient with a short course of steroids as she does note that this has occurred previously and this did help.  She was directed to monitor her blood sugar closely on an outpatient basis.  Patient voiced understanding to the plan and had no additional questions.  Risk Prescription drug management.           Final Clinical Impression(s) / ED Diagnoses Final diagnoses:  None    Rx / DC Orders ED Discharge Orders     None         Emmalene Hare 09/21/23 1448    Ninetta Basket, MD 09/23/23 [phone]

## 2023-09-21 NOTE — Discharge Instructions (Signed)
 [facility]ase follow-up closely with your primary care doctor on an outpatient basis for reevaluation.  Return to emergency department immediately for any new or worsening symptoms.  Please continue to monitor your blood sugar closely while you are taking the prednisone .

## 2023-09-23 ENCOUNTER — Other Ambulatory Visit: Payer: Self-pay

## 2023-09-25 ENCOUNTER — Ambulatory Visit (HOSPITAL_BASED_OUTPATIENT_CLINIC_OR_DEPARTMENT_OTHER): Admitting: Physical Therapy

## 2023-09-25 ENCOUNTER — Encounter (HOSPITAL_BASED_OUTPATIENT_CLINIC_OR_DEPARTMENT_OTHER): Payer: Self-pay | Admitting: Physical Therapy

## 2023-09-27 ENCOUNTER — Encounter: Payer: Self-pay | Admitting: Urology

## 2023-09-27 ENCOUNTER — Ambulatory Visit (HOSPITAL_BASED_OUTPATIENT_CLINIC_OR_DEPARTMENT_OTHER): Attending: Surgery | Admitting: Physical Therapy

## 2023-09-27 ENCOUNTER — Encounter (HOSPITAL_BASED_OUTPATIENT_CLINIC_OR_DEPARTMENT_OTHER): Payer: Self-pay | Admitting: Physical Therapy

## 2023-09-27 DIAGNOSIS — R262 Difficulty in walking, not elsewhere classified: Secondary | ICD-10-CM | POA: Insufficient documentation

## 2023-09-27 DIAGNOSIS — M6281 Muscle weakness (generalized): Secondary | ICD-10-CM | POA: Diagnosis not present

## 2023-09-27 NOTE — Patient Instructions (Signed)
 Kidney Cancer  Kidney cancer is a type of cancer in which an abnormal growth of cells (tumor) forms in one or both kidneys. The kidneys filter waste from your blood and make urine. Kidney cancer may spread to other parts of your body. This type of cancer may also be called renal cell carcinoma. What are the causes? The cause of this condition is not known. What increases the risk? You may be more likely to develop kidney cancer if: You are over age 60. You have certain conditions that are passed from parent to child (inherited). These include von Hippel-Lindau disease, tuberous sclerosis, and hereditary papillary renal carcinoma. You smoke or have been exposed to certain chemicals. You have advanced kidney disease, especially if you need to use a machine to clean your blood (dialysis). You are obese. You have high blood pressure (hypertension). You are female. What are the signs or symptoms? At first, kidney cancer may not cause any symptoms. As the cancer grows, symptoms may include: Blood in the urine. Pain in the upper back or abdomen, just below the rib cage. You may feel pain on one or both sides of your body. Tiredness (fatigue). Weight loss that cannot be explained. Fever. How is this diagnosed? This condition may be diagnosed based on: Your symptoms. Your medical history. A physical exam. You may also have tests, such as: Blood and urine tests. X-rays. Imaging tests, such as CT scans, MRIs, and PET scans. Angiogram. This is when dye is injected into your blood to show your blood vessels. Intravenous pyelogram. This is when dye is injected into your blood to show your kidneys and the other organs that help with making and storing urine. Biopsy. This is when a piece of tissue is removed from your kidney and looked at under a microscope. Your cancer will be assessed (staged), based on how severe it is and how much it has spread. How is this treated? Treatment depends on the type  and stage of the cancer. Treatment may include: Surgery to remove: Just the tumor (nephron-sparing surgery). The entire kidney (nephrectomy). The kidney, some of the healthy tissue around it, nearby lymph nodes, and sometimes the adrenal gland (radical nephrectomy). Medicines to: Kill cancer cells (chemotherapy). Help your body's disease-fighting system (immune system) fight cancer cells. This is known as immunotherapy. Radiation therapy. This uses high-energy rays to kill cancer cells. Targeted therapy to only attack genes and proteins that allow a tumor to grow. This type of therapy tries to limit damage to your healthy cells. Cryoablation. This uses gas or liquid to freeze cancer cells. It is sent through a needle. Radiofrequency ablation. This uses high-energy radio waves to destroy cancer cells. It is sent through a needle-like probe. Embolization. This is a procedure to block the artery that supplies blood to the tumor. Follow these instructions at home: Eating and drinking Some of your treatments may affect your appetite and your ability to chew and swallow. If you have problems eating, or if you do not have an appetite, meet with an expert in diet and nutrition (dietitian). If you have side effects that affect eating, it may help to: Eat smaller meals more often. Eat bland or soft foods. Avoid foods that are hot, spicy, or hard to swallow. Drink shakes or supplements that are high in nutrition and calories. Do not drink alcohol. Lifestyle Do not use any products that contain nicotine or tobacco. These products include cigarettes, chewing tobacco, and vaping devices, such as e-cigarettes. If you need  help quitting, ask your health care provider. Get enough sleep. Most adults need 6-8 hours of sleep each night. During treatment, you may need more sleep. General instructions Take over-the-counter and prescription medicines only as told by your health care provider. Consider joining a  support group. This can help you cope with the stress of having kidney cancer. Work with your health care provider to manage any side effects of treatment. Keep all follow-up visits. Your health care provider will want to make sure your treatment is working. Where to find more information American Cancer Society: cancer.org [employer] (NCI): cancer.gov Contact a health care provider if: You bruise or bleed easily. You lose weight without trying. You have new or worse fatigue or weakness. You have a fever. Your pain suddenly increases. You have more blood in your urine. Your skin or the white parts of your eyes turn yellow (jaundice). Get help right away if: You have chest pain. You are short of breath. You have irregular heartbeats (palpitations). These symptoms may be an emergency. Get help right away. Call 911. Do not wait to see if the symptoms will go away. Do not drive yourself to the hospital. This information is not intended to replace advice given to you by your health care provider. Make sure you discuss any questions you have with your health care provider. Document Revised: 10/25/2021 Document Reviewed: 10/25/2021 Elsevier Patient Education  2024 [employer].

## 2023-09-27 NOTE — Therapy (Signed)
 OUTPATIENT PHYSICAL THERAPY TREATMENT   Patient Name: Christina Cameron MRN: [id number] DOB:February 11, 1966, 58 y.o., female Today's Date: 09/27/2023  END OF SESSION:  PT End of Session - 09/27/23 1103     Visit Number 6    Number of Visits 16    Date for PT Re-Evaluation 10/06/23    Authorization Type [employer] dual complete    PT Start Time 1017    PT Stop Time 1100    PT Time Calculation (min) 43 min    Activity Tolerance Patient tolerated treatment well    Behavior During Therapy WFL for tasks assessed/performed               Past Medical History:  Diagnosis Date   Arm vein blood clot, unspecified laterality    around 20 years ago as of 08/10/21, Patient doesn't remember what caused it. She was not put on blood thinners.   Arthritis    right knee   Asthma    Cancer (HCC)    cervical 1989   Cancer of kidney (HCC)    Chest pain    a. normal cors by cath in 11/2014 / 03/02/2020 nuclear stress test demonstrated no perfusion defects consistent with prior infart or current ischemia. Normal study.   Chronic diastolic (congestive) heart failure (HCC)    09/27/20 Echocardiogram in Epic showed LVEF 60 to 65%.   Complication of anesthesia    post-operative nausea & vomiting   COVID-19    05/29/19 & 05/2020 Covid infections   Depression    Diabetes mellitus, type 2 (HCC)    Dysrhythmia    heart palpitations   GERD (gastroesophageal reflux disease)    H/O cardiovascular stress test 03/02/2020   03/02/2020 Nuclear stress test in Epic showed no perusion defects consistent with prior infarct or ischemia - normal study.   Headache    migraines   High cholesterol    pt takes Crestor    History of cardiac radiofrequency ablation    Hypertension    Last cardiology office visit, 10/22/20 with Mohammed Andrew, NP ( see note in Epic) as of 08/09/21.   Morbid (severe) obesity due to excess calories (HCC)    Neuromuscular disorder (HCC)    severe neuropathy in feet   Osteoarthritis of knee, unspecified     Palpitations    PONV (postoperative nausea and vomiting)    Post-COVID syndrome 05/2019   Pt experienced chest tightness, sob, palpitations & dizziness after Covid infection. See 04/2020 note from Mohammed Andrew, NP in [place].   PTSD (post-traumatic stress disorder)    Raynaud's syndrome    Sleep apnea 09/24/2020   sleep study indicated moderate sleep apea   SVT (supraventricular tachycardia) (HCC)    a. s/p ablation by Dr. Carolynne Citron in 2006.   Wears glasses    prescripton reading glasses   Past Surgical History:  Procedure Laterality Date   CARDIAC CATHETERIZATION N/A 12/14/2014   Procedure: Right/Left Heart Cath and Coronary Angiography;  Surgeon: Arnoldo Lapping, MD;  Location: [facility] INVASIVE CV LAB;  Service: Cardiovascular;  Laterality: N/A;   CARDIAC ELECTROPHYSIOLOGY STUDY AND ABLATION     around 2006, Patient states that heart rate was up to 180 bpm.   CESAREAN SECTION     x2 1990, 2005   CYSTOSCOPY  08/17/2021   Procedure: CYSTOSCOPY;  Surgeon: Matt Song, MD;  Location: [facility];  Service: Gynecology;;   ENDOMETRIAL ABLATION     HYSTEROSCOPY WITH D & C N/A 03/11/2021   Procedure: DILATATION  AND CURETTAGE /HYSTEROSCOPY;  Surgeon: Luan Rumpf, MD;  Location: [facility] OR;  Service: Gynecology;  Laterality: N/A;   OPERATIVE ULTRASOUND N/A 03/11/2021   Procedure: OPERATIVE ULTRASOUND;  Surgeon: Luan Rumpf, MD;  Location: [facility] OR;  Service: Gynecology;  Laterality: N/A;  ultrasound guidance needed   ROBOT ASSISTED LAPAROSCOPIC NEPHRECTOMY Right 12/25/2022   Procedure: XI ROBOTIC ASSISTED LAPAROSCOPIC NEPHRECTOMY;  Surgeon: Marco Severs, MD;  Location: [facility];  Service: Urology;  Laterality: Right;   ROBOTIC ASSISTED LAPAROSCOPIC HYSTERECTOMY AND SALPINGECTOMY Bilateral 08/17/2021   Procedure: XI ROBOTIC ASSISTED LAPAROSCOPIC HYSTERECTOMY AND  BILATERAL SALPINGECTOMY AND OOPHERETOMY;  Surgeon: Matt Song, MD;  Location: [facility];   Service: Gynecology;  Laterality: Bilateral;   Patient Active Problem List   Diagnosis Date Noted   Encounter for antineoplastic immunotherapy 04/11/2023   Primary renal cell carcinoma of right kidney (HCC) 01/30/2023   History of right radical nephrectomy 12/28/2022   Right renal mass 12/25/2022   Cardiac chest pain 05/01/2022   Moderate obstructive sleep apnea 01/23/2022   Heart failure with preserved ejection fraction (HCC) 09/19/2021   Abnormal vaginal bleeding in postmenopausal patient 08/17/2021   Paroxysmal SVT (supraventricular tachycardia) (HCC) 09/17/2019   Upper airway cough syndrome vs atypical asthma/ cough variant  09/17/2019   Anxiety 09/15/2019   Essential hypertension, benign 05/07/2019   Chronic diastolic (congestive) heart failure (HCC)    HLD (hyperlipidemia)    Morbid (severe) obesity due to excess calories (HCC)    Osteoarthritis of knee, unspecified    Palpitations    Somnolence    Type 2 diabetes mellitus with hyperglycemia (HCC)    Raynaud's syndrome    DOE (dyspnea on exertion)    Precordial chest pain 09/08/2014     REFERRING PROVIDER:  Junie Olds, MD     REFERRING DIAG:  K43.2 (ICD-10-CM) - Hernia, incisional  E66.01 (ICD-10-CM) - Morbid obesity (HCC)  Z68.41 (ICD-10-CM) - Body mass index 40.0-44.9, adult (HCC)  Weight loss prior to hernia surgery  Rationale for Evaluation and Treatment: Rehabilitation  THERAPY DIAG:  Muscle weakness (generalized)  Difficulty in walking, not elsewhere classified  ONSET DATE: kidney removed July 2024 and then hernia developed quickly.    SUBJECTIVE:                                                                                                                                                                                           SUBJECTIVE STATEMENT: Pt reports low pain today.  Can tell the therapy is improving her balance   Pt will have to wait after all of her chemo treatment before she  can have  hernia surgery (~ 7 months).  POOL ACCESS:  She has pool at her appt complex but may consider joining [employer] in [place]  PERTINENT HISTORY:  Currently in tx for CA  PAIN:  Are you having pain? Yes: NPRS scale: 3/10 Pain location: across abdomen on R Pain description: pressure, affects bowels, hips are sore Aggravating factors: standing/walking Relieving factors: rest  PRECAUTIONS:  None  RED FLAGS: None   WEIGHT BEARING RESTRICTIONS:  No  FALLS:  Has patient fallen in last 6 months? No  LIVING ENVIRONMENT: Alone but daughter is local- student at [employer]  OCCUPATION:  On disability  PLOF:  Independent  PATIENT GOALS:  Prep for surgery    OBJECTIVE:  Note: Objective measures were completed at Evaluation unless otherwise noted.  PATIENT SURVEYS:  Patient-Specific Activity Scoring Scheme  "0" represents "unable to perform." "10" represents "able to perform at prior level. 0 1 2 3 4 5 6 7 8 9  10 (Date and Score)   Activity Eval 3/10    1. Getting out of bed  4    2. Getting out of tub  3    3. Walking for ADLs 5   4.    5.    Score 12    Total score = sum of the activity scores/number of activities Minimum detectable change (90%CI) for average score = 2 points Minimum detectable change (90%CI) for single activity score = 3 points     COGNITIVE STATUS: Within functional limits for tasks assessed   SENSATION: Bilateral neuropathy- numb in toes and hand, tingling and burning in feet  EDEMA:  Yes: pt reports bilat LE edema  POSTURE:  Hernia displaced to right lower abdominal quadrant  HAND DOMINANCE:  Right  GAIT: Comments: Rt trendelenburg, Rt LE ER   OBJECTIVE: 3/11: unable to stand from chair without bilateral UE  Trunk sidebend in gait, antalgic on right                                                                                                                              TREATMENT DATE:  [facility] Adult PT Treatment:                                                 DATE: 09/27/23 Pt seen for aquatic therapy today.  Treatment took place in water  3.5-4.75 ft in depth at the [facility] pool. Temp of water  was 91.  Pt entered/exited the pool via stairs independently with bilat rail.  - unsupported walking forward/ backward, 4 laps 4.34ft - unsupported side stepping, with arm addct/ abdct -> with rainbow hand floats x 2 laps->slight lunge -tandem stance x 20s_>ue add/abd x 10 leading R/L 3.6 ft -tandem walking forward and back -solid noodle press wide stance then staggered stances x 10 ea.  Cues for  TrA - UE on rainbow hand floats:  3 way LE tap->kick x 10 each LE; hip add/abd crossing midline; curtsy squats x10 -hamstring/gastroc stretching at steps.  Cues for longer hold time and to not bounce - straddling noodle,without UE support cycling across pool x 2 with good core engagement    PATIENT EDUCATION:  Education details: intro to aquatic therapy;  exercise form/rationale Person educated: Patient Education method: Explanation, Demonstration, Tactile cues, Verbal cues Education comprehension: verbalized understanding, returned demonstration, verbal cues required, tactile cues required, and needs further education  HOME EXERCISE PROGRAM: [id number]   ASSESSMENT:  CLINICAL IMPRESSION: Pt reports compliance with hep and stretching at home. She demonstrates stretching today using good form with effective stretching. VC for longer hold time. STG #2 met. Demonstrates improving standing balance submerged both static and dynamic. Able to complete SLS in 3.6 with ue add/abd without gross LOB.  Does require initially extra time to execute.  She has not yet gotten to check out [employer] but is planning on. Goals ongoing  From initial evaluation:  Patient is a 58 y.o. F who was seen today for physical therapy evaluation and treatment for prehab prior to surgical intervention for incisional hernia in right lower abdominal  quadrant. Patient has significant discomfort in knees and hips with minimal ability to engage abdominal wall. Pt will benefit from skilled, aquatic therapy to reduce burden of gravity and uneven distribution of weight to improve functional strength in preparation for surgery.     REHAB POTENTIAL: Fair prehab  CLINICAL DECISION MAKING: Stable/uncomplicated  EVALUATION COMPLEXITY: Low   GOALS: Goals reviewed with patient? Yes  SHORT TERM GOALS: Target date: 08/25/23  Verbalize ability to utilize glut set throughout the day Baseline: Goal status: MET - 09/20/23  2.  Good form presented for stretches Baseline:  Goal status: met 09/27/23  3.  Find a good working level in aquatic exercises Baseline:  Goal status: MET - 09/20/23   LONG TERM GOALS: Target date: POC date  Verbalize readiness for surgery regarding MSK strength Baseline:  Goal status: INITIAL  2.  PSFS to improve by [facility] Baseline:  Goal status: INITIAL  3.  Minimal use of UEs for standing from a chair.  Baseline:  Goal status: INITIAL     PLAN:  PT FREQUENCY: 1-2x/week  PT DURATION: POC date  PLANNED INTERVENTIONS: 97164- PT Re-evaluation, 97110-Therapeutic exercises, 97530- Therapeutic activity, 97112- Neuromuscular re-education, 97535- Self Care, 54098- Manual therapy, [phone]- Gait training, [phone]- Aquatic Therapy, Patient/Family education, Balance training, Stair training, Taping, Joint mobilization, Spinal mobilization, Scar mobilization, Cryotherapy, and Moist heat.  PLAN FOR NEXT SESSION: ACTIVE CA*, aquatics  Adriana Hopping [place]) Maddelynn Moosman MPT 09/27/23 1:01 PM [facility] [address] [place], [place], [address] Phone: [phone]   Fax:  [phone]

## 2023-09-29 ENCOUNTER — Telehealth: Admitting: Nurse Practitioner

## 2023-09-29 DIAGNOSIS — B001 Herpesviral vesicular dermatitis: Secondary | ICD-10-CM

## 2023-09-29 MED ORDER — VALACYCLOVIR HCL 1 G PO TABS: 2000.0000 mg | ORAL_TABLET | Freq: Two times a day (BID) | ORAL | 0 refills | Status: AC

## 2023-09-29 NOTE — Progress Notes (Signed)
 I have spent 5 minutes in review of e-visit questionnaire, review and updating patient chart, medical decision making and response to patient.   Claiborne Rigg, NP

## 2023-09-29 NOTE — Progress Notes (Signed)
 E-Visit for [employer]  We are sorry that you are not feeling well.  Here is how we plan to help!  Based on what you have shared with me it does look like you have a viral infection.    Most cold sores or fever blisters are small fluid filled blisters around the mouth caused by herpes simplex virus.  The most common strain of the virus causing cold sores is herpes simplex virus 1.  It can be spread by skin contact, sharing eating utensils, or even sharing towels.  Cold sores are contagious to other people until dry. (Approximately 5-7 days).  Wash your hands. You can spread the virus to your eyes through handling your contact lenses after touching the lesions.  Most people experience pain at the sight or tingling sensations in their lips that may begin before the ulcers erupt.  Herpes simplex is treatable but not curable.  It may lie dormant for a [place] time and then reappear due to stress or prolonged sun exposure.  Many patients have success in treating their cold sores with an over the counter topical called Abreva.  You may apply the cream up to 5 times daily (maximum 10 days) until healing occurs.  If you would like to use an oral antiviral medication to speed the healing of your cold sore, I have sent a prescription to your local pharmacy Valacyclovir 2 gm take one by mouth twice a day for 1 day    HOME CARE:  Wash your hands frequently. Do not pick at or rub the sore. Don't open the blisters. Avoid kissing other people during this time. Avoid sharing drinking glasses, eating utensils, or razors. Do not handle contact lenses unless you have thoroughly washed your hands with soap and warm water! Avoid oral sex during this time.  Herpes from sores on your mouth can spread to your partner's genital area. Avoid contact with anyone who has eczema or a weakened immune system. Cold sores are often triggered by exposure to intense sunlight, use a lip balm containing a sunscreen (SPF 30 or  higher).  GET HELP RIGHT AWAY IF:  Blisters look infected. Blisters occur near or in the eye. Symptoms last longer than 10 days. Your symptoms become worse.  MAKE SURE YOU:  Understand these instructions. Will watch your condition. Will get help right away if you are not doing well or get worse.    Your e-visit answers were reviewed by a board certified advanced clinical practitioner to complete your personal care plan.  Depending upon the condition, your plan could have  Included both over the counter or prescription medications.    Please review your pharmacy choice.  Be sure that the pharmacy you have chosen is open so that you can pick up your prescription now.  If there is a problem you can message your provider in MyChart to have the prescription routed to another pharmacy.    Your safety is important to [place].  If you have drug allergies check our prescription carefully.  For the next 24 hours you can use MyChart to ask questions about today's visit, request a non-urgent call back, or ask for a work or school excuse from your e-visit provider.  You will get an email in the next two days asking about your experience.  I hope that your e-visit has been valuable and will speed your recovery.

## 2023-10-02 ENCOUNTER — Ambulatory Visit (HOSPITAL_BASED_OUTPATIENT_CLINIC_OR_DEPARTMENT_OTHER): Admitting: Physical Therapy

## 2023-10-03 DIAGNOSIS — K432 Incisional hernia without obstruction or gangrene: Secondary | ICD-10-CM | POA: Diagnosis not present

## 2023-10-04 ENCOUNTER — Ambulatory Visit: Admitting: Podiatry

## 2023-10-04 ENCOUNTER — Encounter (HOSPITAL_BASED_OUTPATIENT_CLINIC_OR_DEPARTMENT_OTHER): Payer: Self-pay | Admitting: Physical Therapy

## 2023-10-04 ENCOUNTER — Ambulatory Visit (HOSPITAL_BASED_OUTPATIENT_CLINIC_OR_DEPARTMENT_OTHER): Admitting: Physical Therapy

## 2023-10-05 NOTE — Progress Notes (Unsigned)
 [facility] OFFICE PROGRESS NOTE  Christina Jarred, PA-C [address] 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 The ureteral, vascular and all margins of resection were negative for tumor.   CURRENT THERAPY: Adjuvant treatment with immunotherapy with Keytruda  200 Mg IV every 3 weeks.  First dose was given on 02/13/2023.  Status post 10 cycles.   INTERVAL HISTORY: Christina Cameron 57 y.o. female returns to the clinic today for a follow-up visit.  The patient was last seen in the clinic by Dr. Marguerita Shih on/21/25.  She is currently undergoing immunotherapy for renal cell carcinoma.  She tolerates this well overall without any concerning adverse side effects except she has been struggling with some significant fatigue. She also reported at her last appointment Dr. Marguerita Shih some discomfort due to a large hernia which is chronic and mildly progressive and she is being evaluated by [facility] surgery who recommended referral to [facility] for surgical intervention due to the size and complexity of the hernia. She was told she will need to lose 60 lbs prior to being considered for surgery. She is expected to follow up with them in 6 months to reassess her candidacy for surgery. She also has intermittent nausea for which she has Compazine  and Zofran .   She also has had increased headaches. She has a history of migraines but has not needed migraine medication in awhile. She also has not been on blood pressure medication in about a month. Unclear if this was discontinued by her PCP or the patient since she states her blood pressure was under  control. She took tylenol  this AM for headache. She took 650 mg of tylenol . She localizes the headache to her left temporal region or on the top of hear head. Occasionally, she may have some pain in her neck. She denies facial droop or speech changes.   She also had some right upper extremity swelling recently for which she had a DVT study which was negative for DVT. She has been having some joint stiffness and swelling in her fingers.   Her thyroid  stimulating hormone (TSH) was noted to be slightly elevated in previous blood work, but her thyroid  hormone levels were within the normal range. Her thyroid  labs are pending from today.    She denies any fever, chills, or unexplained weight loss. She reports her dyspnea on exertion is the same or a little worse which she attributes to the hernia. She denies any cough, chest pain, or hemoptysis. She denies any vomiting. She denies any unusual diarrhea or constipation. She is here today for evaluation repeat blood work before undergoing cycle number 11.    MEDICAL HISTORY: Past Medical History:  Diagnosis Date   Arm vein blood clot, unspecified laterality    around 20 years ago as of 08/10/21, Patient doesn't remember what caused it. She was not put on blood thinners.   Arthritis    right knee   Asthma    Cancer (HCC)    cervical 1989   Cancer of kidney (HCC)    Chest pain    a. normal cors by cath in 11/2014 / 03/02/2020  nuclear stress test demonstrated no perfusion defects consistent with prior infart or current ischemia. Normal study.   Chronic diastolic (congestive) heart failure (HCC)    09/27/20 Echocardiogram in Epic showed LVEF 60 to 65%.   Complication of anesthesia    post-operative nausea & vomiting   COVID-19    05/29/19 & 05/2020 Covid infections   Depression    Diabetes mellitus, type 2 (HCC)    Dysrhythmia    heart palpitations   GERD (gastroesophageal reflux disease)    H/O cardiovascular stress test 03/02/2020   03/02/2020  Nuclear stress test in Epic showed no perusion defects consistent with prior infarct or ischemia - normal study.   Headache    migraines   High cholesterol    pt takes Crestor    History of cardiac radiofrequency ablation    Hypertension    Last cardiology office visit, 10/22/20 with Mohammed Andrew, NP ( see note in Epic) as of 08/09/21.   Morbid (severe) obesity due to excess calories (HCC)    Neuromuscular disorder (HCC)    severe neuropathy in feet   Osteoarthritis of knee, unspecified    Palpitations    PONV (postoperative nausea and vomiting)    Post-COVID syndrome 05/2019   Pt experienced chest tightness, sob, palpitations & dizziness after Covid infection. See 04/2020 note from Mohammed Andrew, NP in [place].   PTSD (post-traumatic stress disorder)    Raynaud's syndrome    Sleep apnea 09/24/2020   sleep study indicated moderate sleep apea   SVT (supraventricular tachycardia) (HCC)    a. s/p ablation by Dr. Carolynne Citron in 2006.   Wears glasses    prescripton reading glasses    ALLERGIES:  has no known allergies.  MEDICATIONS:  Current Outpatient Medications  Medication Sig Dispense Refill   albuterol  (VENTOLIN  HFA) 108 (90 Base) MCG/ACT inhaler Inhale 1-2 puffs into the lungs every 6 (six) hours as needed for wheezing or shortness of breath. 18 g 0   brexpiprazole  (REXULTI ) 2 MG TABS tablet Take 2 mg by mouth daily.     buPROPion  (WELLBUTRIN  XL) 150 MG 24 hr tablet Take 150 mg by mouth every morning.     busPIRone  (BUSPAR ) 10 MG tablet Take 10 mg by mouth 2 (two) times daily.     cetirizine  (ZYRTEC ) 10 MG tablet Take 10 mg by mouth daily as needed for allergies.     cyclobenzaprine  (FLEXERIL ) 5 MG tablet Take 1 tablet (5 mg total) by mouth 3 (three) times daily as needed for muscle spasms. Do not drive or operate a heavy machinery after taking this medicine. 20 tablet 0   ezetimibe  (ZETIA ) 10 MG tablet Take 1 tablet (10 mg total) by mouth daily. 90 tablet 3   furosemide  (LASIX ) 20 MG  tablet Take 1 tablet (20 mg total) by mouth daily. (Patient taking differently: Take 20 mg by mouth daily as needed for fluid or edema.) 90 tablet 3   gabapentin  (NEURONTIN ) 300 MG capsule Take 1 capsule (300 mg total) by mouth 3 (three) times daily. Start at bedtime and slowly increase to 3x a day as needed to make sure no side effects like sedation. (Patient taking differently: Take 300 mg by mouth at bedtime. Start at bedtime and slowly increase to 3x a day as needed to make sure no side effects like sedation.) 90 capsule 11   hydrOXYzine  (ATARAX ) 50 MG tablet Take 100 mg by mouth at bedtime.     LORazepam  (ATIVAN ) 0.5 MG tablet TAKE 1 TABLET BY MOUTH  ONCE DAILY FOR ANXIETY / PANIC ATTACKS.     metFORMIN  (GLUCOPHAGE ) 500 MG tablet Take 1,000 mg by mouth 2 (two) times daily with a meal.     metoprolol  tartrate (LOPRESSOR ) 50 MG tablet Take 2 tablets (100 mg total) by mouth 2 (two) times daily. 180 tablet 3   MOUNJARO 10 MG/0.5ML Pen Inject 10 mg into the skin once a week.     mupirocin  ointment (BACTROBAN ) 2 % Apply 1 Application topically 2 (two) times daily. 30 g 2   nitroGLYCERIN  (NITROSTAT ) 0.4 MG SL tablet DISSOLVE ONE TABLET UNDER THE TONGUE EVERY 5 MINUTES AS NEEDED FOR CHEST PAIN.  DO NOT EXCEED A TOTAL OF 3 DOSES IN 15 MINUTES 75 tablet 0   nystatin (MYCOSTATIN/NYSTOP) powder SMARTSIG:1 Application Topical 2-3 Times Daily     prochlorperazine  (COMPAZINE ) 10 MG tablet Take 1 tablet (10 mg total) by mouth every 6 (six) hours as needed for nausea or vomiting. 30 tablet 1   rosuvastatin  (CRESTOR ) 40 MG tablet Take 1 tablet (40 mg total) by mouth daily. 90 tablet 3   sertraline  (ZOLOFT ) 100 MG tablet Take 200 mg by mouth daily.     tizanidine  (ZANAFLEX ) 2 MG capsule Take 1 capsule (2 mg total) by mouth 3 (three) times daily. 15 capsule 0   No current facility-administered medications for this visit.    SURGICAL HISTORY:  Past Surgical History:  Procedure Laterality Date   CARDIAC  CATHETERIZATION N/A 12/14/2014   Procedure: Right/Left Heart Cath and Coronary Angiography;  Surgeon: Arnoldo Lapping, MD;  Location: [facility] INVASIVE CV LAB;  Service: Cardiovascular;  Laterality: N/A;   CARDIAC ELECTROPHYSIOLOGY STUDY AND ABLATION     around 2006, Patient states that heart rate was up to 180 bpm.   CESAREAN SECTION     x2 1990, 2005   CYSTOSCOPY  08/17/2021   Procedure: CYSTOSCOPY;  Surgeon: Matt Song, MD;  Location: [facility];  Service: Gynecology;;   ENDOMETRIAL ABLATION     HYSTEROSCOPY WITH D & C N/A 03/11/2021   Procedure: DILATATION AND CURETTAGE /HYSTEROSCOPY;  Surgeon: Luan Rumpf, MD;  Location: [facility] OR;  Service: Gynecology;  Laterality: N/A;   OPERATIVE ULTRASOUND N/A 03/11/2021   Procedure: OPERATIVE ULTRASOUND;  Surgeon: Luan Rumpf, MD;  Location: [facility] OR;  Service: Gynecology;  Laterality: N/A;  ultrasound guidance needed   ROBOT ASSISTED LAPAROSCOPIC NEPHRECTOMY Right 12/25/2022   Procedure: XI ROBOTIC ASSISTED LAPAROSCOPIC NEPHRECTOMY;  Surgeon: Marco Severs, MD;  Location: [facility];  Service: Urology;  Laterality: Right;   ROBOTIC ASSISTED LAPAROSCOPIC HYSTERECTOMY AND SALPINGECTOMY Bilateral 08/17/2021   Procedure: XI ROBOTIC ASSISTED LAPAROSCOPIC HYSTERECTOMY AND  BILATERAL SALPINGECTOMY AND OOPHERETOMY;  Surgeon: Matt Song, MD;  Location: [facility];  Service: Gynecology;  Laterality: Bilateral;    REVIEW OF SYSTEMS:   Review of Systems  Constitutional: Positive for fatigue. Negative for appetite change, chills, fever and unexpected weight change.  HENT: Negative for mouth sores, nosebleeds, sore throat and trouble swallowing.   Eyes: Negative for eye problems and icterus.  Respiratory: Positive for baseline dyspnea. Negative for cough, hemoptysis,  and wheezing.   Cardiovascular: Negative for chest pain and leg swelling.  Gastrointestinal: positive for intermittent nausea. Negative for abdominal pain,  diarrhea, and vomiting.  Positive for hernia.  Genitourinary: Negative for bladder incontinence, difficulty urinating, dysuria, frequency and hematuria.   Musculoskeletal: Positive for chronic intermittent low back pain. Negative for  gait problem, neck pain and neck stiffness.  Skin: Negative for itching and  rash.  Neurological: Negative for dizziness, extremity weakness, gait problem, light-headedness and seizures. Positive for headaches. Hematological: Negative for adenopathy. Does not bruise/bleed easily.  Psychiatric/Behavioral: Negative for confusion, depression and sleep disturbance. The patient is not nervous/anxious.     PHYSICAL EXAMINATION:  There were no vitals taken for this visit.  ECOG PERFORMANCE STATUS: 1  Physical Exam  Constitutional: Oriented to person, place, and time and well-developed, well-nourished, and in no distress.  HENT:  Head: Normocephalic and atraumatic.  Mouth/Throat: Oropharynx is clear and moist. No oropharyngeal exudate.  Eyes: Conjunctivae are normal. Right eye exhibits no discharge. Left eye exhibits no discharge. No scleral icterus.  Neck: Normal range of motion. Neck supple.  Cardiovascular: Normal rate, regular rhythm, normal heart sounds and intact distal pulses.   Pulmonary/Chest: Effort normal and breath sounds normal. No respiratory distress. No wheezes. No rales.  Abdominal: Soft. Bowel sounds are normal. Exhibits no distension and no mass. There is no tenderness. Positive for abdominal hernia.  Musculoskeletal: Normal range of motion. Exhibits no edema.  Lymphadenopathy:    No cervical adenopathy.  Neurological: Alert and oriented to person, place, and time. Exhibits normal muscle tone. Gait normal. Coordination normal.  Skin: Skin is warm and dry. No rash noted. Not diaphoretic. No erythema. No pallor.  Psychiatric: Mood, memory and judgment normal.  Vitals reviewed.  LABORATORY DATA: Lab Results  Component Value Date   WBC 8.6  09/17/2023   HGB 13.0 09/17/2023   HCT 37.1 09/17/2023   MCV 86.1 09/17/2023   PLT 205 09/17/2023      Chemistry      Component Value Date/Time   NA 137 09/17/2023 1111   NA 137 06/08/2023 1049   NA 140 10/19/2011 0919   K 4.0 09/17/2023 1111   K 4.1 10/19/2011 0919   CL 104 09/17/2023 1111   CL 104 10/19/2011 0919   CO2 25 09/17/2023 1111   CO2 24 10/19/2011 0919   BUN 26 (H) 09/17/2023 1111   BUN 21 06/08/2023 1049   BUN 16 10/19/2011 0919   CREATININE 1.06 (H) 09/17/2023 1111   CREATININE 0.63 10/19/2011 0919   GLU 297 04/15/2019 0000      Component Value Date/Time   CALCIUM  9.1 09/17/2023 1111   CALCIUM  8.5 10/19/2011 0919   ALKPHOS 44 09/17/2023 1111   ALKPHOS 57 10/19/2011 0919   AST 17 09/17/2023 1111   ALT 18 09/17/2023 1111   ALT 24 10/19/2011 0919   BILITOT 0.5 09/17/2023 1111       RADIOGRAPHIC STUDIES:  US  Venous Img Upper Right (DVT Study) Result Date: 09/21/2023 CLINICAL DATA:  Right hand edema for 1 day EXAM: RIGHT UPPER EXTREMITY VENOUS DOPPLER ULTRASOUND TECHNIQUE: Gray-scale sonography with graded compression, as well as color Doppler and duplex ultrasound were performed to evaluate the upper extremity deep venous system from the level of the subclavian vein and including the jugular, axillary, basilic, radial, ulnar and upper cephalic vein. Spectral Doppler was utilized to evaluate flow at rest and with distal augmentation maneuvers. COMPARISON:  06/13/2015 FINDINGS: Contralateral Subclavian Vein: Respiratory phasicity is normal and symmetric with the symptomatic side. No evidence of thrombus. Normal compressibility. Internal Jugular Vein: No evidence of thrombus. Normal compressibility, respiratory phasicity and response to augmentation. Subclavian Vein: No evidence of thrombus. Normal compressibility, respiratory phasicity and response to augmentation. Axillary Vein: No evidence of thrombus. Normal compressibility, respiratory phasicity and response to  augmentation. Cephalic Vein: No evidence of thrombus. Normal compressibility, respiratory phasicity and response to augmentation.  Basilic Vein: No evidence of thrombus. Normal compressibility, respiratory phasicity and response to augmentation. Brachial Veins: No evidence of thrombus. Normal compressibility, respiratory phasicity and response to augmentation. Radial Veins: No evidence of thrombus. Normal compressibility, respiratory phasicity and response to augmentation. Ulnar Veins: No evidence of thrombus. Normal compressibility, respiratory phasicity and response to augmentation. Venous Reflux:  None visualized. Other Findings:  None visualized. IMPRESSION: No evidence of DVT within the right upper extremity. Electronically Signed   By: Elester Grim M.D.   On: 09/21/2023 14:03   US  Venous Img Lower Right (DVT Study) Result Date: 09/21/2023 CLINICAL DATA:  Right hip pain for 1 day. EXAM: RIGHT LOWER EXTREMITY VENOUS DOPPLER ULTRASOUND TECHNIQUE: Gray-scale sonography with compression, as well as color and duplex ultrasound, were performed to evaluate the deep venous system(s) from the level of the common femoral vein through the popliteal and proximal calf veins. COMPARISON:  None available FINDINGS: VENOUS Normal compressibility of the common femoral, superficial femoral, and popliteal veins, as well as the visualized calf veins. Visualized portions of profunda femoral vein and great saphenous vein unremarkable. No filling defects to suggest DVT on grayscale or color Doppler imaging. Doppler waveforms show normal direction of venous flow, normal respiratory plasticity and response to augmentation. Limited views of the contralateral common femoral vein are unremarkable. OTHER None. Limitations: none IMPRESSION: No right lower extremity DVT. Electronically Signed   By: Elester Grim M.D.   On: 09/21/2023 14:02     ASSESSMENT/PLAN:  This is a very pleasant 58 year old Caucasian female diagnosed with stage  III (T3a, N0, M0) clear-cell renal cell carcinoma\of the right kidney.  The patient was diagnosed in July 2024.  She is status post robot-assisted right radical nephrectomy.  The final pathology showed clear-cell renal cell carcinoma with focal rhabdoid features, nuclear grade 4, with a tumor size of 10 cm and the tumor extended into the renal sinus fat and renal vein (P T3a).  The margins were negative for tumor.   The patient is currently on adjuvant immunotherapy with Keytruda  200 mg IV every 3 weeks. She is status post 10 cycles of treatment and has been tolerating it fair.    Labs were reviewed.  Recommend that she proceed with cycle #11 today as scheduled.   We will see her back for follow-up visit in 3 weeks for evaluation repeat blood work before undergoing her next cycle of treatment.   She will see surgery at [facility] to see if she is a candidate for hernia repair surgery. I encouraged her to schedule a weight loss counseling session with her PCP to maximize her candidacy for hernia surgery which is interfere with her quality of life. Engage in low-impact activities like stationary biking or swimming.   Hypertension Unclear management status as she has been off medication for a month. Headaches may be related to elevated blood pressure. - Contact family doctor to clarify medication status. - Log blood pressure readings at home. - Avoid decongestants.  Headaches Increased frequency and persistence with a history of migraines.  - Order brain MRI to rule out cancer-related causes. - Ensure no MRI contraindications. - Follow up with family doctor for headache management if negative for metastatic disease.   Elevated TSH Slightly elevated TSH suggests possible subclinical hypothyroidism, potentially due to immunotherapy. - Monitor TSH and T4 levels.Today's labs are pending.  - Consider low-dose Synthroid if TSH remains elevated.  The patient was advised to call immediately if she has any  concerning symptoms in the interval.  The patient voices understanding of current disease status and treatment options and is in agreement with the current care plan. All questions were answered. The patient knows to call the clinic with any problems, questions or concerns. We can certainly see the patient much sooner if necessary       No orders of the defined types were placed in this encounter.   The total time spent in the appointment was 20-29 minutes.  Christina Brallier L Vannie Hochstetler, PA-C 10/05/23

## 2023-10-08 ENCOUNTER — Inpatient Hospital Stay: Attending: Internal Medicine

## 2023-10-08 ENCOUNTER — Inpatient Hospital Stay

## 2023-10-08 ENCOUNTER — Inpatient Hospital Stay (HOSPITAL_BASED_OUTPATIENT_CLINIC_OR_DEPARTMENT_OTHER): Attending: Internal Medicine | Admitting: Physician Assistant

## 2023-10-08 VITALS — BP 157/76 | HR 66 | Temp 97.9°F | Resp 13 | Wt 269.2 lb

## 2023-10-08 DIAGNOSIS — I10 Essential (primary) hypertension: Secondary | ICD-10-CM | POA: Diagnosis not present

## 2023-10-08 DIAGNOSIS — C641 Malignant neoplasm of right kidney, except renal pelvis: Secondary | ICD-10-CM | POA: Diagnosis not present

## 2023-10-08 DIAGNOSIS — R946 Abnormal results of thyroid function studies: Secondary | ICD-10-CM | POA: Insufficient documentation

## 2023-10-08 DIAGNOSIS — R519 Headache, unspecified: Secondary | ICD-10-CM | POA: Insufficient documentation

## 2023-10-08 DIAGNOSIS — Z905 Acquired absence of kidney: Secondary | ICD-10-CM | POA: Diagnosis not present

## 2023-10-08 DIAGNOSIS — Z5112 Encounter for antineoplastic immunotherapy: Secondary | ICD-10-CM | POA: Insufficient documentation

## 2023-10-08 DIAGNOSIS — Z79899 Other long term (current) drug therapy: Secondary | ICD-10-CM | POA: Diagnosis not present

## 2023-10-08 LAB — CMP (CANCER CENTER ONLY)
ALT: 18 U/L (ref 0–44)
AST: 15 U/L (ref 15–41)
Albumin: 4 g/dL (ref 3.5–5.0)
Alkaline Phosphatase: 40 U/L (ref 38–126)
Anion gap: 8 (ref 5–15)
BUN: 19 mg/dL (ref 6–20)
CO2: 26 mmol/L (ref 22–32)
Calcium: 8.9 mg/dL (ref 8.9–10.3)
Chloride: 102 mmol/L (ref 98–111)
Creatinine: 0.92 mg/dL (ref 0.44–1.00)
GFR, Estimated: >60 mL/min (ref >60)
Glucose, Bld: 280 mg/dL — ABNORMAL HIGH (ref 70–99)
Potassium: 4.2 mmol/L (ref 3.5–5.1)
Sodium: 136 mmol/L (ref 135–145)
Total Bilirubin: 0.5 mg/dL (ref 0.0–1.2)
Total Protein: 6.7 g/dL (ref 6.5–8.1)

## 2023-10-08 LAB — CBC WITH DIFFERENTIAL (CANCER CENTER ONLY)
Abs Immature Granulocytes: 0.05 10*3/uL (ref 0.00–0.07)
Basophils Absolute: 0 10*3/uL (ref 0.0–0.1)
Basophils Relative: 0 %
Eosinophils Absolute: 0.3 10*3/uL (ref 0.0–0.5)
Eosinophils Relative: 5 %
HCT: 37.2 % (ref 36.0–46.0)
Hemoglobin: 12.6 g/dL (ref 12.0–15.0)
Immature Granulocytes: 1 %
Lymphocytes Relative: 26 %
Lymphs Abs: 1.9 10*3/uL (ref 0.7–4.0)
MCH: 29.6 pg (ref 26.0–34.0)
MCHC: 33.9 g/dL (ref 30.0–36.0)
MCV: 87.3 fL (ref 80.0–100.0)
Monocytes Absolute: 0.6 10*3/uL (ref 0.1–1.0)
Monocytes Relative: 8 %
Neutro Abs: 4.5 10*3/uL (ref 1.7–7.7)
Neutrophils Relative %: 60 %
Platelet Count: 165 10*3/uL (ref 150–400)
RBC: 4.26 MIL/uL (ref 3.87–5.11)
RDW: 13.6 % (ref 11.5–15.5)
WBC Count: 7.4 10*3/uL (ref 4.0–10.5)
nRBC: 0 % (ref 0.0–0.2)

## 2023-10-08 LAB — TSH: TSH: 5.62 u[IU]/mL — ABNORMAL HIGH (ref 0.350–4.500)

## 2023-10-08 MED ORDER — SODIUM CHLORIDE 0.9 % IV SOLN: Freq: Once | INTRAVENOUS | Status: AC

## 2023-10-08 MED ORDER — SODIUM CHLORIDE 0.9% FLUSH: 10.0000 mL | INTRAVENOUS | Status: DC | PRN

## 2023-10-08 MED ORDER — HEPARIN SOD (PORK) LOCK FLUSH 100 UNIT/ML IV SOLN: 500.0000 [IU] | Freq: Once | INTRAVENOUS | Status: DC | PRN

## 2023-10-08 MED ORDER — SODIUM CHLORIDE 0.9 % IV SOLN
200.0000 mg | Freq: Once | INTRAVENOUS | Status: AC
  Administered 2023-10-08: 200 mg via INTRAVENOUS
  Filled 2023-10-08: qty 200

## 2023-10-08 NOTE — Patient Instructions (Signed)

## 2023-10-09 ENCOUNTER — Other Ambulatory Visit: Payer: Self-pay

## 2023-10-09 LAB — T4: T4, Total: 5.7 ug/dL (ref 4.5–12.0)

## 2023-10-10 ENCOUNTER — Telehealth: Admitting: Family Medicine

## 2023-10-10 DIAGNOSIS — K047 Periapical abscess without sinus: Secondary | ICD-10-CM

## 2023-10-10 MED ORDER — CLINDAMYCIN HCL 300 MG PO CAPS: 300.0000 mg | ORAL_CAPSULE | Freq: Three times a day (TID) | ORAL | 0 refills | Status: AC

## 2023-10-10 NOTE — Progress Notes (Signed)

## 2023-10-12 ENCOUNTER — Ambulatory Visit: Admitting: Internal Medicine

## 2023-10-15 ENCOUNTER — Ambulatory Visit (HOSPITAL_COMMUNITY): Admission: RE | Admit: 2023-10-15 | Source: Ambulatory Visit

## 2023-10-18 ENCOUNTER — Ambulatory Visit (HOSPITAL_COMMUNITY)

## 2023-10-20 ENCOUNTER — Ambulatory Visit (HOSPITAL_COMMUNITY)
Admission: RE | Admit: 2023-10-20 | Discharge: 2023-10-20 | Disposition: A | Discharge status: Home / Self Care | Source: Ambulatory Visit | Attending: Physician Assistant | Admitting: Physician Assistant

## 2023-10-20 DIAGNOSIS — R519 Headache, unspecified: Secondary | ICD-10-CM | POA: Insufficient documentation

## 2023-10-20 DIAGNOSIS — C641 Malignant neoplasm of right kidney, except renal pelvis: Secondary | ICD-10-CM | POA: Diagnosis not present

## 2023-10-20 DIAGNOSIS — C649 Malignant neoplasm of unspecified kidney, except renal pelvis: Secondary | ICD-10-CM | POA: Diagnosis not present

## 2023-10-20 MED ORDER — GADOBUTROL 1 MMOL/ML IV SOLN
10.0000 mL | Freq: Once | INTRAVENOUS | Status: AC | PRN
  Administered 2023-10-20: 10 mL via INTRAVENOUS

## 2023-10-23 ENCOUNTER — Encounter: Payer: Self-pay | Admitting: Internal Medicine

## 2023-10-25 DIAGNOSIS — E7849 Other hyperlipidemia: Secondary | ICD-10-CM | POA: Diagnosis not present

## 2023-10-25 DIAGNOSIS — R42 Dizziness and giddiness: Secondary | ICD-10-CM | POA: Diagnosis not present

## 2023-10-25 DIAGNOSIS — Z1329 Encounter for screening for other suspected endocrine disorder: Secondary | ICD-10-CM | POA: Diagnosis not present

## 2023-10-25 DIAGNOSIS — I10 Essential (primary) hypertension: Secondary | ICD-10-CM | POA: Diagnosis not present

## 2023-10-25 DIAGNOSIS — E1165 Type 2 diabetes mellitus with hyperglycemia: Secondary | ICD-10-CM | POA: Diagnosis not present

## 2023-10-26 NOTE — Progress Notes (Signed)
 [facility] OFFICE PROGRESS NOTE  Fredick Jarred, PA-C [address] [place] [address]  DIAGNOSIS: Stage III (T3a, N0, M0) clear-cell renal cell carcinoma of the right kidney diagnosed in July 2024   PRIOR THERAPY: status post robotic assisted right radical nephrectomy and the final pathology was consistent with clear-cell renal cell carcinoma with focal rhabdoid features, nuclear grade 4 with tumor size of 10.0 cm and tumor extends into the renal sinus fat and renal vein (pT3a). The ureteral, vascular and all margins of resection were negative for tumor.   CURRENT THERAPY: Adjuvant treatment with immunotherapy with Keytruda  200 Mg IV every 3 weeks.  First dose was given on 02/13/2023.  Status post 11 cycles.   INTERVAL HISTORY: Christina Cameron 58 y.o. female returns to the clinic today for a follow-up visit.  The patient was last seen in the clinic by myself on 10/08/23.  She is currently undergoing immunotherapy for renal cell carcinoma.  She tolerates this well overall without any concerning adverse side effects except she has been struggling with some significant fatigue. Her TSH was a little abnormal at her last appointment. Her TSH is pending from today.   She has been struggling with other health concerns. She needs to have 15 teeth pulled. Dr. Marguerita Shih recommended waiting until after she completes her treatments.   She still continues to have discomfort due to a large hernia which is chronic and mildly progressive and she is being evaluated by [facility] who recommended referral to [facility] for surgical intervention due to the size and complexity of the hernia. She was told she will need to lose 60 lbs prior to being considered for surgery. She is expected to follow up with them in 6 months to reassess her candidacy for surgery. She also has intermittent nausea for which she has Compazine  and Zofran . She denies vomiting.   She also has some swelling in her right hand. This  affects her ability to make a fist. She is supposed to see a hand specialist soon. She had a DVT study that was negative a few weeks ago for DVT. She went to an urgent care yesterday who felt this could be carpal tunnel since she had numbness in her fingers. She has her wrist wrapped for compression and support. She was given steroid injection. They prescribed steroids p.o. but she has not taken this or picked them up yet since her symptoms had started to improve somewhat.   She denies any fever, chills, or unexplained weight loss. She reports her dyspnea on exertion is the same. The hernia exacerbates her shortness of breath. She denies any cough, chest pain, or hemoptysis. She denies any vomiting. She denies any unusual diarrhea. She sometimes has constipation. She denies rashes or skin changes. She is here today for evaluation repeat blood work before undergoing cycle number 12.     MEDICAL HISTORY: Past Medical History:  Diagnosis Date   Arm vein blood clot, unspecified laterality    around 20 years ago as of 08/10/21, Patient doesn't remember what caused it. She was not put on blood thinners.   Arthritis    right knee   Asthma    Cancer (HCC)    cervical 1989   Cancer of kidney (HCC)    Chest pain    a. normal cors by cath in 11/2014 / 03/02/2020 nuclear stress test demonstrated no perfusion defects consistent with prior infart or current ischemia. Normal study.   Chronic diastolic (congestive)  heart failure (HCC)    09/27/20 Echocardiogram in Epic showed LVEF 60 to 65%.   Complication of anesthesia    post-operative nausea & vomiting   COVID-19    05/29/19 & 05/2020 Covid infections   Depression    Diabetes mellitus, type 2 (HCC)    Dysrhythmia    heart palpitations   GERD (gastroesophageal reflux disease)    H/O cardiovascular stress test 03/02/2020   03/02/2020 Nuclear stress test in Epic showed no perusion defects consistent with prior infarct or ischemia - normal study.   Headache     migraines   High cholesterol    pt takes Crestor    History of cardiac radiofrequency ablation    Hypertension    Last cardiology office visit, 10/22/20 with Mohammed Andrew, NP ( see note in Epic) as of 08/09/21.   Morbid (severe) obesity due to excess calories (HCC)    Neuromuscular disorder (HCC)    severe neuropathy in feet   Osteoarthritis of knee, unspecified    Palpitations    PONV (postoperative nausea and vomiting)    Post-COVID syndrome 05/2019   Pt experienced chest tightness, sob, palpitations & dizziness after Covid infection. See 04/2020 note from Mohammed Andrew, NP in [place].   PTSD (post-traumatic stress disorder)    Raynaud's syndrome    Sleep apnea 09/24/2020   sleep study indicated moderate sleep apea   SVT (supraventricular tachycardia) (HCC)    a. s/p ablation by Dr. Carolynne Citron in 2006.   Wears glasses    prescripton reading glasses    ALLERGIES:  has no known allergies.  MEDICATIONS:  Current Outpatient Medications  Medication Sig Dispense Refill   albuterol  (VENTOLIN  HFA) 108 (90 Base) MCG/ACT inhaler Inhale 1-2 puffs into the lungs every 6 (six) hours as needed for wheezing or shortness of breath. 18 g 0   brexpiprazole  (REXULTI ) 2 MG TABS tablet Take 2 mg by mouth daily.     buPROPion  (WELLBUTRIN  XL) 150 MG 24 hr tablet Take 150 mg by mouth every morning.     busPIRone  (BUSPAR ) 10 MG tablet Take 10 mg by mouth 2 (two) times daily.     cetirizine  (ZYRTEC ) 10 MG tablet Take 10 mg by mouth daily as needed for allergies.     cyclobenzaprine  (FLEXERIL ) 5 MG tablet Take 1 tablet (5 mg total) by mouth 3 (three) times daily as needed for muscle spasms. Do not drive or operate a heavy machinery after taking this medicine. 20 tablet 0   ezetimibe  (ZETIA ) 10 MG tablet Take 1 tablet (10 mg total) by mouth daily. 90 tablet 3   furosemide  (LASIX ) 20 MG tablet Take 1 tablet (20 mg total) by mouth daily. (Patient taking differently: Take 20 mg by mouth daily as needed for fluid or  edema.) 90 tablet 3   gabapentin  (NEURONTIN ) 300 MG capsule Take 1 capsule (300 mg total) by mouth 3 (three) times daily. Start at bedtime and slowly increase to 3x a day as needed to make sure no side effects like sedation. (Patient taking differently: Take 300 mg by mouth at bedtime. Start at bedtime and slowly increase to 3x a day as needed to make sure no side effects like sedation.) 90 capsule 11   hydrOXYzine  (ATARAX ) 50 MG tablet Take 100 mg by mouth at bedtime.     LORazepam  (ATIVAN ) 0.5 MG tablet TAKE 1 TABLET BY MOUTH ONCE DAILY FOR ANXIETY / PANIC ATTACKS.     metFORMIN  (GLUCOPHAGE ) 500 MG tablet Take 1,000 mg by mouth  2 (two) times daily with a meal.     metoprolol  tartrate (LOPRESSOR ) 50 MG tablet Take 2 tablets (100 mg total) by mouth 2 (two) times daily. 180 tablet 3   MOUNJARO 10 MG/0.5ML Pen Inject 10 mg into the skin once a week.     mupirocin  ointment (BACTROBAN ) 2 % Apply 1 Application topically 2 (two) times daily. 30 g 2   nitroGLYCERIN  (NITROSTAT ) 0.4 MG SL tablet DISSOLVE ONE TABLET UNDER THE TONGUE EVERY 5 MINUTES AS NEEDED FOR CHEST PAIN.  DO NOT EXCEED A TOTAL OF 3 DOSES IN 15 MINUTES 75 tablet 0   nystatin (MYCOSTATIN/NYSTOP) powder SMARTSIG:1 Application Topical 2-3 Times Daily     predniSONE  (DELTASONE ) 20 MG tablet Take 2 tablets (40 mg total) by mouth daily with breakfast for 5 days. 10 tablet 0   prochlorperazine  (COMPAZINE ) 10 MG tablet Take 1 tablet (10 mg total) by mouth every 6 (six) hours as needed for nausea or vomiting. 30 tablet 1   rosuvastatin  (CRESTOR ) 40 MG tablet Take 1 tablet (40 mg total) by mouth daily. 90 tablet 3   sertraline  (ZOLOFT ) 100 MG tablet Take 200 mg by mouth daily.     tizanidine  (ZANAFLEX ) 2 MG capsule Take 1 capsule (2 mg total) by mouth 3 (three) times daily. 15 capsule 0   No current facility-administered medications for this visit.    SURGICAL HISTORY:  Past Surgical History:  Procedure Laterality Date   CARDIAC CATHETERIZATION  N/A 12/14/2014   Procedure: Right/Left Heart Cath and Coronary Angiography;  Surgeon: Arnoldo Lapping, MD;  Location: [facility] INVASIVE CV LAB;  Service: Cardiovascular;  Laterality: N/A;   CARDIAC ELECTROPHYSIOLOGY STUDY AND ABLATION     around 2006, Patient states that heart rate was up to 180 bpm.   CESAREAN SECTION     x2 1990, 2005   CYSTOSCOPY  08/17/2021   Procedure: CYSTOSCOPY;  Surgeon: Matt Song, MD;  Location: [facility];  Service: Gynecology;;   ENDOMETRIAL ABLATION     HYSTEROSCOPY WITH D & C N/A 03/11/2021   Procedure: DILATATION AND CURETTAGE /HYSTEROSCOPY;  Surgeon: Luan Rumpf, MD;  Location: [facility] OR;  Service: Gynecology;  Laterality: N/A;   OPERATIVE ULTRASOUND N/A 03/11/2021   Procedure: OPERATIVE ULTRASOUND;  Surgeon: Luan Rumpf, MD;  Location: [facility] OR;  Service: Gynecology;  Laterality: N/A;  ultrasound guidance needed   ROBOT ASSISTED LAPAROSCOPIC NEPHRECTOMY Right 12/25/2022   Procedure: XI ROBOTIC ASSISTED LAPAROSCOPIC NEPHRECTOMY;  Surgeon: Marco Severs, MD;  Location: [facility];  Service: Urology;  Laterality: Right;   ROBOTIC ASSISTED LAPAROSCOPIC HYSTERECTOMY AND SALPINGECTOMY Bilateral 08/17/2021   Procedure: XI ROBOTIC ASSISTED LAPAROSCOPIC HYSTERECTOMY AND  BILATERAL SALPINGECTOMY AND OOPHERETOMY;  Surgeon: Matt Song, MD;  Location: [facility];  Service: Gynecology;  Laterality: Bilateral;    REVIEW OF SYSTEMS:   Constitutional: Positive for fatigue. Negative for appetite change, chills, fever and unexpected weight change.  HENT: Negative for mouth sores, nosebleeds, sore throat and trouble swallowing.   Eyes: Negative for eye problems and icterus.  Respiratory: Positive for baseline dyspnea. Negative for cough, hemoptysis,  and wheezing.   Cardiovascular: Negative for chest pain and leg swelling. Positive for right hand swelling.  Gastrointestinal: positive for intermittent nausea. Negative for abdominal pain,  diarrhea, and vomiting.  Positive for large hernia.  Genitourinary: Negative for bladder incontinence, difficulty urinating, dysuria, frequency and hematuria.   Musculoskeletal: Positive for chronic intermittent low back pain. Negative for  gait problem, neck pain and neck stiffness.  Skin: Negative for itching and rash.  Neurological: Negative for dizziness, extremity weakness, gait problem, light-headedness and seizures. Positive for headaches. Hematological: Negative for adenopathy. Does not bruise/bleed easily.  Psychiatric/Behavioral: Negative for confusion, depression and sleep disturbance. The patient is not nervous/anxious.     PHYSICAL EXAMINATION:  Blood pressure 136/83, pulse 71, temperature 97.6 F (36.4 C), temperature source Temporal, resp. rate 18, weight 268 lb 6.4 oz (121.7 kg), SpO2 96%.  ECOG PERFORMANCE STATUS: 1  Physical Exam  Constitutional: Oriented to person, place, and time and well-developed, well-nourished, and in no distress.  HENT:  Head: Normocephalic and atraumatic.  Mouth/Throat: Oropharynx is clear and moist. No oropharyngeal exudate.  Eyes: Conjunctivae are normal. Right eye exhibits no discharge. Left eye exhibits no discharge. No scleral icterus.  Neck: Normal range of motion. Neck supple.  Cardiovascular: Normal rate, regular rhythm, normal heart sounds and intact distal pulses.   Pulmonary/Chest: Effort normal and breath sounds normal. No respiratory distress. No wheezes. No rales.  Abdominal: Soft. Bowel sounds are normal. Exhibits no distension and no mass. There is no tenderness. Positive for abdominal hernia.  Musculoskeletal: Normal range of motion. Positive for right finger swelling she has her wrist wrapped.  Lymphadenopathy:    No cervical adenopathy.  Neurological: Alert and oriented to person, place, and time. Exhibits normal muscle tone. Gait normal. Coordination normal.  Skin: Skin is warm and dry. No rash noted. Not diaphoretic. No  erythema. No pallor.  Psychiatric: Mood, memory and judgment normal.  Vitals reviewed.  LABORATORY DATA: Lab Results  Component Value Date   WBC 9.0 10/30/2023   HGB 13.9 10/30/2023   HCT 40.3 10/30/2023   MCV 87.4 10/30/2023   PLT 242 10/30/2023      Chemistry      Component Value Date/Time   NA 136 10/08/2023 1017   NA 137 06/08/2023 1049   NA 140 10/19/2011 0919   K 4.2 10/08/2023 1017   K 4.1 10/19/2011 0919   CL 102 10/08/2023 1017   CL 104 10/19/2011 0919   CO2 26 10/08/2023 1017   CO2 24 10/19/2011 0919   BUN 19 10/08/2023 1017   BUN 21 06/08/2023 1049   BUN 16 10/19/2011 0919   CREATININE 0.92 10/08/2023 1017   CREATININE 0.63 10/19/2011 0919   GLU 297 04/15/2019 0000      Component Value Date/Time   CALCIUM  8.9 10/08/2023 1017   CALCIUM  8.5 10/19/2011 0919   ALKPHOS 40 10/08/2023 1017   ALKPHOS 57 10/19/2011 0919   AST 15 10/08/2023 1017   ALT 18 10/08/2023 1017   ALT 24 10/19/2011 0919   BILITOT 0.5 10/08/2023 1017       RADIOGRAPHIC STUDIES:  MR Brain W Wo Contrast Result Date: 10/28/2023 EXAMINATION: MR BRAIN W WO CONTRAST HISTORY: Metastatic disease evaluation; Stage III renal cell carcinoma, increased frequency and severity of headaches. TECHNIQUE: MRI of the brain performed with and without IV contrast. CONTRAST: 10 cc Gadavist  IV COMPARISON: 03/15/2022 FINDINGS: No evidence for intracranial mass, hemorrhage, or acute infarct. There is normal gray-white matter differentiation. The ventricles are symmetric and the basilar cisterns are patent. There is mucosal thickening in the left maxillary sinus. The mastoid air cells are well aerated. The orbits are normal. No abnormal enhancement is appreciated following the administration of intravenous contrast material. IMPRESSION: No evidence for intracranial metastases. Electronically signed by: [place] Engel MD 10/28/2023 05:12 PM EDT RP Workstation: [id number]     ASSESSMENT/PLAN:  This is a very pleasant  58 year old  Caucasian female diagnosed with stage III (T3a, N0, M0) clear-cell renal cell carcinoma\of the right kidney.  The patient was diagnosed in July 2024.  She is status post robot-assisted right radical nephrectomy.  The final pathology showed clear-cell renal cell carcinoma with focal rhabdoid features, nuclear grade 4, with a tumor size of 10 cm and the tumor extended into the renal sinus fat and renal vein (P T3a).  The margins were negative for tumor.   The patient is currently on adjuvant immunotherapy with Keytruda  200 mg IV every 3 weeks. She is status post 11 cycles of treatment and has been tolerating it fair.    Labs were reviewed.  Recommend that she proceed with cycle #12 today as scheduled. She has not started the prednisone . She was going to hold off on taking the prednisone  since her symptoms improved. If she needs it, she is going to try to take it at least a week after the infusion.    We will see her back for follow-up visit in 3 weeks for evaluation repeat blood work before undergoing her next cycle of treatment.    She will see surgery at [facility] to see if she is a candidate for hernia repair surgery.   Her TSH is pending and T4. If elevated, Dr. Marguerita Shih may want to start her on 25 mcg of synthroid.   She will follow up with the hand specialist for her hand swelling. The DVT study was negative. She was advised to elevate her hand and use compression.   She is going to wait to have her dental extractions until after she finishes treatment which is expected to be finished in September.   The patient was advised to call immediately if she has any concerning symptoms in the interval. The patient voices understanding of current disease status and treatment options and is in agreement with the current care plan. All questions were answered. The patient knows to call the clinic with any problems, questions or concerns. We can certainly see the patient much sooner if  necessary      No orders of the defined types were placed in this encounter.    The total time spent in the appointment was 20-29 minutes  Ashayla Subia L Sharmaine Bain, PA-C 10/30/23

## 2023-10-29 ENCOUNTER — Ambulatory Visit: Admitting: Internal Medicine

## 2023-10-29 ENCOUNTER — Ambulatory Visit

## 2023-10-29 ENCOUNTER — Other Ambulatory Visit

## 2023-10-29 ENCOUNTER — Ambulatory Visit
Admission: RE | Admit: 2023-10-29 | Discharge: 2023-10-29 | Disposition: A | Discharge status: Home / Self Care | Source: Ambulatory Visit | Attending: Nurse Practitioner | Admitting: Nurse Practitioner

## 2023-10-29 ENCOUNTER — Ambulatory Visit: Admitting: Podiatry

## 2023-10-29 VITALS — BP 128/84 | HR 77 | Temp 98.0°F | Resp 16

## 2023-10-29 DIAGNOSIS — M79641 Pain in right hand: Secondary | ICD-10-CM | POA: Diagnosis not present

## 2023-10-29 MED ORDER — METHYLPREDNISOLONE ACETATE 40 MG/ML IJ SUSP
40.0000 mg | Freq: Once | INTRAMUSCULAR | Status: AC
  Administered 2023-10-29: 40 mg via INTRAMUSCULAR

## 2023-10-29 MED ORDER — PREDNISONE 20 MG PO TABS: 40.0000 mg | ORAL_TABLET | Freq: Every day | ORAL | 0 refills | Status: AC

## 2023-10-29 NOTE — ED Provider Notes (Signed)
 [facility]    CSN: [id number] Arrival date & time: 10/29/23  1024      History   Chief Complaint Chief Complaint  Patient presents with   Hand Problem    Swollen severe pain can't bend fingers into hand sharp pain up arm - Entered by patient   Hand Pain    HPI Christina Cameron is a 58 y.o. female.   Patient presents today with 2 month history of right hand pain and swelling.  She denies any injury or trauma prior to the hand pain started.  Pain seems to start in the wrist and shoots up to her right elbow and goes down to her fingertips making her fingers feel numb at times.  Reports the hand is very swollen and she is having difficulty making a fist or using her right hand at all.  Pain was worst last night, woke her up at 3 am.  Has been wearing carpal tunnel brace which sometimes helps and sometimes makes the pain worse.  She used to work in [employer] and is right handed dominant.  No redness, warmth, fever, nausea/vomiting since pain began.    She reports she initially when symptoms started, she told her kidney doctor about the hand pain and they were concerned about DVT.  She went to the ER and was tested for DVT and was negative in April 2025.  She was treated with prednisone  and symptoms improved temporarily but the pain never fully went away.  Symptoms have recurred and worsened over past few days. No new injury or trauma.  Has been taking Tylenol  and has tried topical pain relief as well as the bracing for carpal tunnel without much benefit.  Has upcoming appointment with hand specialist.    Past Medical History:  Diagnosis Date   Arm vein blood clot, unspecified laterality    around 20 years ago as of 08/10/21, Patient doesn't remember what caused it. She was not put on blood thinners.   Arthritis    right knee   Asthma    Cancer (HCC)    cervical 1989   Cancer of kidney (HCC)    Chest pain    a. normal cors by cath in 11/2014 / 03/02/2020 nuclear stress  test demonstrated no perfusion defects consistent with prior infart or current ischemia. Normal study.   Chronic diastolic (congestive) heart failure (HCC)    09/27/20 Echocardiogram in Epic showed LVEF 60 to 65%.   Complication of anesthesia    post-operative nausea & vomiting   COVID-19    05/29/19 & 05/2020 Covid infections   Depression    Diabetes mellitus, type 2 (HCC)    Dysrhythmia    heart palpitations   GERD (gastroesophageal reflux disease)    H/O cardiovascular stress test 03/02/2020   03/02/2020 Nuclear stress test in Epic showed no perusion defects consistent with prior infarct or ischemia - normal study.   Headache    migraines   High cholesterol    pt takes Crestor    History of cardiac radiofrequency ablation    Hypertension    Last cardiology office visit, 10/22/20 with Mohammed Andrew, NP ( see note in Epic) as of 08/09/21.   Morbid (severe) obesity due to excess calories (HCC)    Neuromuscular disorder (HCC)    severe neuropathy in feet   Osteoarthritis of knee, unspecified    Palpitations    PONV (postoperative nausea and vomiting)    Post-COVID syndrome 05/2019   Pt experienced chest  tightness, sob, palpitations & dizziness after Covid infection. See 04/2020 note from Mohammed Andrew, NP in [place].   PTSD (post-traumatic stress disorder)    Raynaud's syndrome    Sleep apnea 09/24/2020   sleep study indicated moderate sleep apea   SVT (supraventricular tachycardia) (HCC)    a. s/p ablation by Dr. Carolynne Citron in 2006.   Wears glasses    prescripton reading glasses    Patient Active Problem List   Diagnosis Date Noted   Encounter for antineoplastic immunotherapy 04/11/2023   Primary renal cell carcinoma of right kidney (HCC) 01/30/2023   History of right radical nephrectomy 12/28/2022   Right renal mass 12/25/2022   Cardiac chest pain 05/01/2022   Moderate obstructive sleep apnea 01/23/2022   Heart failure with preserved ejection fraction (HCC) 09/19/2021   Abnormal  vaginal bleeding in postmenopausal patient 08/17/2021   Paroxysmal SVT (supraventricular tachycardia) (HCC) 09/17/2019   Upper airway cough syndrome vs atypical asthma/ cough variant  09/17/2019   Anxiety 09/15/2019   Essential hypertension, benign 05/07/2019   Chronic diastolic (congestive) heart failure (HCC)    HLD (hyperlipidemia)    Morbid (severe) obesity due to excess calories (HCC)    Osteoarthritis of knee, unspecified    Palpitations    Somnolence    Type 2 diabetes mellitus with hyperglycemia (HCC)    Raynaud's syndrome    DOE (dyspnea on exertion)    Precordial chest pain 09/08/2014    Past Surgical History:  Procedure Laterality Date   CARDIAC CATHETERIZATION N/A 12/14/2014   Procedure: Right/Left Heart Cath and Coronary Angiography;  Surgeon: Arnoldo Lapping, MD;  Location: [facility] INVASIVE CV LAB;  Service: Cardiovascular;  Laterality: N/A;   CARDIAC ELECTROPHYSIOLOGY STUDY AND ABLATION     around 2006, Patient states that heart rate was up to 180 bpm.   CESAREAN SECTION     x2 1990, 2005   CYSTOSCOPY  08/17/2021   Procedure: CYSTOSCOPY;  Surgeon: Matt Song, MD;  Location: [facility];  Service: Gynecology;;   ENDOMETRIAL ABLATION     HYSTEROSCOPY WITH D & C N/A 03/11/2021   Procedure: DILATATION AND CURETTAGE /HYSTEROSCOPY;  Surgeon: Luan Rumpf, MD;  Location: [facility];  Service: Gynecology;  Laterality: N/A;   OPERATIVE ULTRASOUND N/A 03/11/2021   Procedure: OPERATIVE ULTRASOUND;  Surgeon: Luan Rumpf, MD;  Location: [facility];  Service: Gynecology;  Laterality: N/A;  ultrasound guidance needed   ROBOT ASSISTED LAPAROSCOPIC NEPHRECTOMY Right 12/25/2022   Procedure: XI ROBOTIC ASSISTED LAPAROSCOPIC NEPHRECTOMY;  Surgeon: Marco Severs, MD;  Location: [facility];  Service: Urology;  Laterality: Right;   ROBOTIC ASSISTED LAPAROSCOPIC HYSTERECTOMY AND SALPINGECTOMY Bilateral 08/17/2021   Procedure: XI ROBOTIC ASSISTED LAPAROSCOPIC HYSTERECTOMY AND   BILATERAL SALPINGECTOMY AND OOPHERETOMY;  Surgeon: Matt Song, MD;  Location: [facility];  Service: Gynecology;  Laterality: Bilateral;    OB History     Gravida  3   Para  2   Term  2   Preterm      AB  1   Living  2      SAB  1   IAB      Ectopic      Multiple      Live Births               Home Medications    Prior to Admission medications   Medication Sig Start Date End Date Taking? Authorizing Provider  brexpiprazole  (REXULTI ) 2 MG TABS tablet Take 2 mg by mouth daily.  Yes [provider]  buPROPion  (WELLBUTRIN  XL) 150 MG 24 hr tablet Take 150 mg by mouth every morning.   Yes [provider]  busPIRone  (BUSPAR ) 10 MG tablet Take 10 mg by mouth 2 (two) times daily. 10/19/20  Yes [provider]  metFORMIN  (GLUCOPHAGE ) 500 MG tablet Take 1,000 mg by mouth 2 (two) times daily with a meal.   Yes [provider]  metoprolol  tartrate (LOPRESSOR ) 50 MG tablet Take 2 tablets (100 mg total) by mouth 2 (two) times daily. 09/19/21  Yes Chandrasekhar, Mahesh A, MD  predniSONE  (DELTASONE ) 20 MG tablet Take 2 tablets (40 mg total) by mouth daily with breakfast for 5 days. 10/29/23 11/03/23 Yes Wilhemena Harbour, NP  rosuvastatin  (CRESTOR ) 40 MG tablet Take 1 tablet (40 mg total) by mouth daily. 05/01/22 10/29/23 Yes Mallipeddi, Vishnu P, MD  sertraline  (ZOLOFT ) 100 MG tablet Take 200 mg by mouth daily. 09/28/20  Yes [provider]  albuterol  (VENTOLIN  HFA) 108 (90 Base) MCG/ACT inhaler Inhale 1-2 puffs into the lungs every 6 (six) hours as needed for wheezing or shortness of breath. 05/02/20   Avegno, Komlanvi S, FNP  cetirizine  (ZYRTEC ) 10 MG tablet Take 10 mg by mouth daily as needed for allergies.    [provider]  cyclobenzaprine  (FLEXERIL ) 5 MG tablet Take 1 tablet (5 mg total) by mouth 3 (three) times daily as needed for muscle spasms. Do not drive or operate a heavy machinery after taking this  medicine. 08/16/23   Gandhi, Safal, PA-C  ezetimibe  (ZETIA ) 10 MG tablet Take 1 tablet (10 mg total) by mouth daily. 10/14/21 09/21/23  Jann Melody, MD  furosemide  (LASIX ) 20 MG tablet Take 1 tablet (20 mg total) by mouth daily. Patient taking differently: Take 20 mg by mouth daily as needed for fluid or edema. 09/19/21   Chandrasekhar, Mahesh A, MD  gabapentin  (NEURONTIN ) 300 MG capsule Take 1 capsule (300 mg total) by mouth 3 (three) times daily. Start at bedtime and slowly increase to 3x a day as needed to make sure no side effects like sedation. Patient taking differently: Take 300 mg by mouth at bedtime. Start at bedtime and slowly increase to 3x a day as needed to make sure no side effects like sedation. 03/07/22   Glory Larsen, MD  hydrOXYzine  (ATARAX ) 50 MG tablet Take 100 mg by mouth at bedtime.    [provider]  LORazepam  (ATIVAN ) 0.5 MG tablet TAKE 1 TABLET BY MOUTH ONCE DAILY FOR ANXIETY / PANIC ATTACKS.    [provider]  MOUNJARO 10 MG/0.5ML Pen Inject 10 mg into the skin once a week. 09/18/23   [provider]  mupirocin  ointment (BACTROBAN ) 2 % Apply 1 Application topically 2 (two) times daily. 02/02/22   Charity Conch, DPM  nitroGLYCERIN  (NITROSTAT ) 0.4 MG SL tablet DISSOLVE ONE TABLET UNDER THE TONGUE EVERY 5 MINUTES AS NEEDED FOR CHEST PAIN.  DO NOT EXCEED A TOTAL OF 3 DOSES IN 15 MINUTES 09/26/22   Chandrasekhar, Mahesh A, MD  nystatin (MYCOSTATIN/NYSTOP) powder SMARTSIG:1 Application Topical 2-3 Times Daily    [provider]  prochlorperazine  (COMPAZINE ) 10 MG tablet Take 1 tablet (10 mg total) by mouth every 6 (six) hours as needed for nausea or vomiting. 07/16/23   Heilingoetter, Cassandra L, PA-C  tizanidine  (ZANAFLEX ) 2 MG capsule Take 1 capsule (2 mg total) by mouth 3 (three) times daily. 08/15/23   Gandhi, Safal, PA-C    Family History Family History  Problem  Relation Age of Onset   Cancer Mother    Cancer Father     Parkinson's disease Father    Stroke Brother    Hypertension Brother    Cancer - Colon Brother     Social History Social History   Tobacco Use   Smoking status: Never    Passive exposure: Past   Smokeless tobacco: Never  Vaping Use   Vaping status: Never Used  Substance Use Topics   Alcohol use: Yes    Comment: seldom   Drug use: No     Allergies   Patient has no known allergies.   Review of Systems Review of Systems Per HPI  Physical Exam Triage Vital Signs ED Triage Vitals  Encounter Vitals Group     BP 10/29/23 1102 128/84     Systolic BP Percentile --      Diastolic BP Percentile --      Pulse Rate 10/29/23 1102 77     Resp 10/29/23 1102 16     Temp 10/29/23 1102 98 F (36.7 C)     Temp Source 10/29/23 1102 Oral     SpO2 10/29/23 1102 95 %     Weight --      Height --      Head Circumference --      Peak Flow --      Pain Score 10/29/23 1103 8     Pain Loc --      Pain Education --      Exclude from Growth Chart --    No data found.  Updated Vital Signs BP 128/84 (BP Location: Right Arm)   Pulse 77   Temp 98 F (36.7 C) (Oral)   Resp 16   SpO2 95%   Visual Acuity Right Eye Distance:   Left Eye Distance:   Bilateral Distance:    Right Eye Near:   Left Eye Near:    Bilateral Near:     Physical Exam Vitals and nursing note reviewed.  Constitutional:      General: She is not in acute distress.    Appearance: Normal appearance. She is not toxic-appearing.  HENT:     Mouth/Throat:     Mouth: Mucous membranes are moist.     Pharynx: Oropharynx is clear.  Pulmonary:     Effort: Pulmonary effort is normal. No respiratory distress.  Musculoskeletal:     Comments: Inspection: no swelling, bruising, obvious deformity or redness to right upper extremity Palpation: tender to palpation right hand, wrist, all 5 digits diffusely; no obvious deformities palpated ROM: difficult to assess secondary to pain Strength: difficult to assess secondary  to pain Neurovascular: neurovascularly intact in bilateral upper extremities distally   Skin:    General: Skin is warm and dry.     Capillary Refill: Capillary refill takes less than 2 seconds.     Coloration: Skin is not jaundiced or pale.     Findings: No erythema.  Neurological:     Mental Status: She is alert and oriented to person, place, and time.  Psychiatric:        Behavior: Behavior is cooperative.      UC Treatments / Results  Labs (all labs ordered are listed, but only abnormal results are displayed) Labs Reviewed - No data to display  EKG   Radiology No results found.  Procedures Procedures (including critical care time)  Medications Ordered in UC Medications  methylPREDNISolone  acetate (DEPO-MEDROL ) injection 40 mg (40 mg Intramuscular Given 10/29/23 1132)  Initial Impression / Assessment and Plan / UC Course  I have reviewed the triage vital signs and the nursing notes.  Pertinent labs & imaging results that were available during my care of the patient were reviewed by me and considered in my medical decision making (see chart for details).   Patient is well-appearing, normotensive, afebrile, not tachycardic, not tachypneic, oxygenating well on room air.   1. Right hand pain Unclear cause, suspect possible carpal tunnel vs. Tenosynovitis or combination of the 2 No red flags or signs of septic arthritis Will treat with Depo Medrol  IM today and start oral prednisone  tomorrow; also provided ace wrap for compression/support Offered hand xray, patient declines for now Recommended follow up with Hand Specialist as planned  The patient was given the opportunity to ask questions.  All questions answered to their satisfaction.  The patient is in agreement to this plan.   Final Clinical Impressions(s) / UC Diagnoses   Final diagnoses:  Right hand pain     Discharge Instructions      We have given you an injection of steroid medication today to help with  pain and swelling.  Start taking the oral prednisone  tomorrow since this helped last time.  You can continue Tylenol , ice, Ace wrap in the meantime.  Follow up with Hand Specialist as planned.   ED Prescriptions     Medication Sig Dispense Auth. Provider   predniSONE  (DELTASONE ) 20 MG tablet Take 2 tablets (40 mg total) by mouth daily with breakfast for 5 days. 10 tablet Wilhemena Harbour, NP      PDMP not reviewed this encounter.   Wilhemena Harbour, NP 10/29/23 1224

## 2023-10-29 NOTE — Discharge Instructions (Addendum)
 We have given you an injection of steroid medication today to help with pain and swelling.  Start taking the oral prednisone  tomorrow since this helped last time.  You can continue Tylenol , ice, Ace wrap in the meantime.  Follow up with Hand Specialist as planned.

## 2023-10-29 NOTE — ED Triage Notes (Signed)
 Right hand pain and numbness x 4 days. Pt recently had swelling and pain in the right hand x 2 months and was rules out for DVT in the hospital but got worse 4 days ago.

## 2023-10-30 ENCOUNTER — Inpatient Hospital Stay (HOSPITAL_BASED_OUTPATIENT_CLINIC_OR_DEPARTMENT_OTHER): Admitting: Physician Assistant

## 2023-10-30 ENCOUNTER — Inpatient Hospital Stay: Attending: Internal Medicine

## 2023-10-30 ENCOUNTER — Inpatient Hospital Stay

## 2023-10-30 VITALS — BP 136/83 | HR 71 | Temp 97.6°F | Resp 18 | Wt 268.4 lb

## 2023-10-30 DIAGNOSIS — E1141 Type 2 diabetes mellitus with diabetic mononeuropathy: Secondary | ICD-10-CM | POA: Insufficient documentation

## 2023-10-30 DIAGNOSIS — Z5112 Encounter for antineoplastic immunotherapy: Secondary | ICD-10-CM

## 2023-10-30 DIAGNOSIS — Z7985 Long-term (current) use of injectable non-insulin antidiabetic drugs: Secondary | ICD-10-CM | POA: Diagnosis not present

## 2023-10-30 DIAGNOSIS — G5600 Carpal tunnel syndrome, unspecified upper limb: Secondary | ICD-10-CM | POA: Diagnosis not present

## 2023-10-30 DIAGNOSIS — F32A Depression, unspecified: Secondary | ICD-10-CM | POA: Diagnosis not present

## 2023-10-30 DIAGNOSIS — M1711 Unilateral primary osteoarthritis, right knee: Secondary | ICD-10-CM | POA: Diagnosis not present

## 2023-10-30 DIAGNOSIS — Z79899 Other long term (current) drug therapy: Secondary | ICD-10-CM | POA: Insufficient documentation

## 2023-10-30 DIAGNOSIS — G4730 Sleep apnea, unspecified: Secondary | ICD-10-CM | POA: Insufficient documentation

## 2023-10-30 DIAGNOSIS — R5383 Other fatigue: Secondary | ICD-10-CM | POA: Diagnosis not present

## 2023-10-30 DIAGNOSIS — Z905 Acquired absence of kidney: Secondary | ICD-10-CM | POA: Diagnosis not present

## 2023-10-30 DIAGNOSIS — C641 Malignant neoplasm of right kidney, except renal pelvis: Secondary | ICD-10-CM | POA: Insufficient documentation

## 2023-10-30 DIAGNOSIS — I5032 Chronic diastolic (congestive) heart failure: Secondary | ICD-10-CM | POA: Insufficient documentation

## 2023-10-30 DIAGNOSIS — E7800 Pure hypercholesterolemia, unspecified: Secondary | ICD-10-CM | POA: Insufficient documentation

## 2023-10-30 DIAGNOSIS — Z7984 Long term (current) use of oral hypoglycemic drugs: Secondary | ICD-10-CM | POA: Diagnosis not present

## 2023-10-30 DIAGNOSIS — I110 Hypertensive heart disease with heart failure: Secondary | ICD-10-CM | POA: Diagnosis not present

## 2023-10-30 DIAGNOSIS — J45909 Unspecified asthma, uncomplicated: Secondary | ICD-10-CM | POA: Insufficient documentation

## 2023-10-30 DIAGNOSIS — M791 Myalgia, unspecified site: Secondary | ICD-10-CM | POA: Insufficient documentation

## 2023-10-30 LAB — CBC WITH DIFFERENTIAL (CANCER CENTER ONLY)
Abs Immature Granulocytes: 0.04 10*3/uL (ref 0.00–0.07)
Basophils Absolute: 0.1 10*3/uL (ref 0.0–0.1)
Basophils Relative: 1 %
Eosinophils Absolute: 0.2 10*3/uL (ref 0.0–0.5)
Eosinophils Relative: 3 %
HCT: 40.3 % (ref 36.0–46.0)
Hemoglobin: 13.9 g/dL (ref 12.0–15.0)
Immature Granulocytes: 0 %
Lymphocytes Relative: 26 %
Lymphs Abs: 2.3 10*3/uL (ref 0.7–4.0)
MCH: 30.2 pg (ref 26.0–34.0)
MCHC: 34.5 g/dL (ref 30.0–36.0)
MCV: 87.4 fL (ref 80.0–100.0)
Monocytes Absolute: 0.8 10*3/uL (ref 0.1–1.0)
Monocytes Relative: 8 %
Neutro Abs: 5.6 10*3/uL (ref 1.7–7.7)
Neutrophils Relative %: 62 %
Platelet Count: 242 10*3/uL (ref 150–400)
RBC: 4.61 MIL/uL (ref 3.87–5.11)
RDW: 13.2 % (ref 11.5–15.5)
WBC Count: 9 10*3/uL (ref 4.0–10.5)
nRBC: 0 % (ref 0.0–0.2)

## 2023-10-30 LAB — CMP (CANCER CENTER ONLY)
ALT: 16 U/L (ref 0–44)
AST: 21 U/L (ref 15–41)
Albumin: 4.5 g/dL (ref 3.5–5.0)
Alkaline Phosphatase: 46 U/L (ref 38–126)
Anion gap: 9 (ref 5–15)
BUN: 22 mg/dL — ABNORMAL HIGH (ref 6–20)
CO2: 28 mmol/L (ref 22–32)
Calcium: 9.5 mg/dL (ref 8.9–10.3)
Chloride: 101 mmol/L (ref 98–111)
Creatinine: 1 mg/dL (ref 0.44–1.00)
GFR, Estimated: >60 mL/min (ref >60)
Glucose, Bld: 211 mg/dL — ABNORMAL HIGH (ref 70–99)
Potassium: 3.9 mmol/L (ref 3.5–5.1)
Sodium: 138 mmol/L (ref 135–145)
Total Bilirubin: 0.6 mg/dL (ref 0.0–1.2)
Total Protein: 7.7 g/dL (ref 6.5–8.1)

## 2023-10-30 LAB — TSH: TSH: 4.33 u[IU]/mL (ref 0.350–4.500)

## 2023-10-30 MED ORDER — SODIUM CHLORIDE 0.9 % IV SOLN: Freq: Once | INTRAVENOUS | Status: AC

## 2023-10-30 MED ORDER — SODIUM CHLORIDE 0.9 % IV SOLN
200.0000 mg | Freq: Once | INTRAVENOUS | Status: AC
  Administered 2023-10-30: 200 mg via INTRAVENOUS
  Filled 2023-10-30: qty 200

## 2023-10-30 NOTE — Patient Instructions (Signed)

## 2023-10-31 LAB — T4: T4, Total: 7.4 ug/dL (ref 4.5–12.0)

## 2023-11-01 DIAGNOSIS — E1165 Type 2 diabetes mellitus with hyperglycemia: Secondary | ICD-10-CM | POA: Diagnosis not present

## 2023-11-01 DIAGNOSIS — K432 Incisional hernia without obstruction or gangrene: Secondary | ICD-10-CM | POA: Diagnosis not present

## 2023-11-01 DIAGNOSIS — C641 Malignant neoplasm of right kidney, except renal pelvis: Secondary | ICD-10-CM | POA: Diagnosis not present

## 2023-11-02 DIAGNOSIS — M25549 Pain in joints of unspecified hand: Secondary | ICD-10-CM | POA: Diagnosis not present

## 2023-11-12 ENCOUNTER — Ambulatory Visit: Admitting: Orthopedic Surgery

## 2023-11-15 ENCOUNTER — Ambulatory Visit (HOSPITAL_BASED_OUTPATIENT_CLINIC_OR_DEPARTMENT_OTHER): Admitting: Orthopaedic Surgery

## 2023-11-15 ENCOUNTER — Ambulatory Visit (HOSPITAL_BASED_OUTPATIENT_CLINIC_OR_DEPARTMENT_OTHER)

## 2023-11-15 DIAGNOSIS — M1811 Unilateral primary osteoarthritis of first carpometacarpal joint, right hand: Secondary | ICD-10-CM | POA: Diagnosis not present

## 2023-11-15 DIAGNOSIS — R200 Anesthesia of skin: Secondary | ICD-10-CM | POA: Diagnosis not present

## 2023-11-15 DIAGNOSIS — M79641 Pain in right hand: Secondary | ICD-10-CM

## 2023-11-15 DIAGNOSIS — M7989 Other specified soft tissue disorders: Secondary | ICD-10-CM | POA: Diagnosis not present

## 2023-11-15 NOTE — Progress Notes (Signed)
 Chief Complaint: Right hand pain     History of Present Illness:    Christina Cameron is a 58 y.o. female right-hand-dominant female presents with ongoing right hand pain and numbness in the thumb index finger middle finger.  She does have this in the tips of these digits that is worse when she sleeps.  She does feel swollen and pain particular about the thenar eminence.  She has not previously been diagnosed with any carpal tunnel syndrome.  She has been seen by her nephrologist who did rule out a blood clot on the side    PMH/PSH/Family History/Social History/Meds/Allergies:    Past Medical History:  Diagnosis Date   Arm vein blood clot, unspecified laterality    around 20 years ago as of 08/10/21, Patient doesn't remember what caused it. She was not put on blood thinners.   Arthritis    right knee   Asthma    Cancer (HCC)    cervical 1989   Cancer of kidney (HCC)    Chest pain    a. normal cors by cath in 11/2014 / 03/02/2020 nuclear stress test demonstrated no perfusion defects consistent with prior infart or current ischemia. Normal study.   Chronic diastolic (congestive) heart failure (HCC)    09/27/20 Echocardiogram in Epic showed LVEF 60 to 65%.   Complication of anesthesia    post-operative nausea & vomiting   COVID-19    05/29/19 & 05/2020 Covid infections   Depression    Diabetes mellitus, type 2 (HCC)    Dysrhythmia    heart palpitations   GERD (gastroesophageal reflux disease)    H/O cardiovascular stress test 03/02/2020   03/02/2020 Nuclear stress test in Epic showed no perusion defects consistent with prior infarct or ischemia - normal study.   Headache    migraines   High cholesterol    pt takes Crestor    History of cardiac radiofrequency ablation    Hypertension    Last cardiology office visit, 10/22/20 with Mohammed Andrew, NP ( see note in Epic) as of 08/09/21.   Morbid (severe) obesity due to excess calories (HCC)    Neuromuscular disorder (HCC)    severe  neuropathy in feet   Osteoarthritis of knee, unspecified    Palpitations    PONV (postoperative nausea and vomiting)    Post-COVID syndrome 05/2019   Pt experienced chest tightness, sob, palpitations & dizziness after Covid infection. See 04/2020 note from Mohammed Andrew, NP in [place].   PTSD (post-traumatic stress disorder)    Raynaud's syndrome    Sleep apnea 09/24/2020   sleep study indicated moderate sleep apea   SVT (supraventricular tachycardia) (HCC)    a. s/p ablation by Dr. Carolynne Citron in 2006.   Wears glasses    prescripton reading glasses   Past Surgical History:  Procedure Laterality Date   CARDIAC CATHETERIZATION N/A 12/14/2014   Procedure: Right/Left Heart Cath and Coronary Angiography;  Surgeon: Arnoldo Lapping, MD;  Location: [facility] INVASIVE CV LAB;  Service: Cardiovascular;  Laterality: N/A;   CARDIAC ELECTROPHYSIOLOGY STUDY AND ABLATION     around 2006, Patient states that heart rate was up to 180 bpm.   CESAREAN SECTION     x2 1990, 2005   CYSTOSCOPY  08/17/2021   Procedure: CYSTOSCOPY;  Surgeon: Matt Song, MD;  Location: [facility];  Service: Gynecology;;   ENDOMETRIAL ABLATION     HYSTEROSCOPY WITH D & C N/A 03/11/2021   Procedure: DILATATION AND CURETTAGE /HYSTEROSCOPY;  Surgeon: Luan Rumpf,  MD;  Location: [facility];  Service: Gynecology;  Laterality: N/A;   OPERATIVE ULTRASOUND N/A 03/11/2021   Procedure: OPERATIVE ULTRASOUND;  Surgeon: Luan Rumpf, MD;  Location: [facility];  Service: Gynecology;  Laterality: N/A;  ultrasound guidance needed   ROBOT ASSISTED LAPAROSCOPIC NEPHRECTOMY Right 12/25/2022   Procedure: XI ROBOTIC ASSISTED LAPAROSCOPIC NEPHRECTOMY;  Surgeon: Marco Severs, MD;  Location: [facility];  Service: Urology;  Laterality: Right;   ROBOTIC ASSISTED LAPAROSCOPIC HYSTERECTOMY AND SALPINGECTOMY Bilateral 08/17/2021   Procedure: XI ROBOTIC ASSISTED LAPAROSCOPIC HYSTERECTOMY AND  BILATERAL SALPINGECTOMY AND OOPHERETOMY;  Surgeon: Matt Song, MD;  Location: [facility];  Service: Gynecology;  Laterality: Bilateral;   Social History   Socioeconomic History   Marital status: Married    Spouse name: Not on file   Number of children: Not on file   Years of education: Not on file   Highest education level: Not on file  Occupational History   Not on file  Tobacco Use   Smoking status: Never    Passive exposure: Past   Smokeless tobacco: Never  Vaping Use   Vaping status: Never Used  Substance and Sexual Activity   Alcohol use: Yes    Comment: seldom   Drug use: No   Sexual activity: Yes    Birth control/protection: None  Other Topics Concern   Not on file  Social History Narrative   Caffiene  occasional pepsi 1-2 days.    Education: some college   Work disability: PTSD. Medical.   Social Drivers of Health   Financial Resource Strain: Not on file  Food Insecurity: No Food Insecurity (12/25/2022)   Hunger Vital Sign    Worried About Running Out of Food in the Last Year: Never true    Ran Out of Food in the Last Year: Never true  Transportation Needs: No Transportation Needs (12/25/2022)   PRAPARE - Administrator, Civil Service (Medical): No    Lack of Transportation (Non-Medical): No  Physical Activity: Not on file  Stress: Not on file  Social Connections: Not on file   Family History  Problem Relation Age of Onset   Cancer Mother    Cancer Father    Parkinson's disease Father    Stroke Brother    Hypertension Brother    Cancer - Colon Brother    No Known Allergies Current Outpatient Medications  Medication Sig Dispense Refill   albuterol  (VENTOLIN  HFA) 108 (90 Base) MCG/ACT inhaler Inhale 1-2 puffs into the lungs every 6 (six) hours as needed for wheezing or shortness of breath. 18 g 0   brexpiprazole  (REXULTI ) 2 MG TABS tablet Take 2 mg by mouth daily.     buPROPion  (WELLBUTRIN  XL) 150 MG 24 hr tablet Take 150 mg by mouth every morning.     busPIRone  (BUSPAR ) 10 MG  tablet Take 10 mg by mouth 2 (two) times daily.     cetirizine  (ZYRTEC ) 10 MG tablet Take 10 mg by mouth daily as needed for allergies.     cyclobenzaprine  (FLEXERIL ) 5 MG tablet Take 1 tablet (5 mg total) by mouth 3 (three) times daily as needed for muscle spasms. Do not drive or operate a heavy machinery after taking this medicine. 20 tablet 0   ezetimibe  (ZETIA ) 10 MG tablet Take 1 tablet (10 mg total) by mouth daily. 90 tablet 3   furosemide  (LASIX ) 20 MG tablet Take 1 tablet (20 mg total) by mouth daily. (Patient taking differently: Take 20 mg by  mouth daily as needed for fluid or edema.) 90 tablet 3   gabapentin  (NEURONTIN ) 300 MG capsule Take 1 capsule (300 mg total) by mouth 3 (three) times daily. Start at bedtime and slowly increase to 3x a day as needed to make sure no side effects like sedation. (Patient taking differently: Take 300 mg by mouth at bedtime. Start at bedtime and slowly increase to 3x a day as needed to make sure no side effects like sedation.) 90 capsule 11   hydrOXYzine  (ATARAX ) 50 MG tablet Take 100 mg by mouth at bedtime.     LORazepam  (ATIVAN ) 0.5 MG tablet TAKE 1 TABLET BY MOUTH ONCE DAILY FOR ANXIETY / PANIC ATTACKS.     metFORMIN  (GLUCOPHAGE ) 500 MG tablet Take 1,000 mg by mouth 2 (two) times daily with a meal.     metoprolol  tartrate (LOPRESSOR ) 50 MG tablet Take 2 tablets (100 mg total) by mouth 2 (two) times daily. 180 tablet 3   MOUNJARO 10 MG/0.5ML Pen Inject 10 mg into the skin once a week.     mupirocin  ointment (BACTROBAN ) 2 % Apply 1 Application topically 2 (two) times daily. 30 g 2   nitroGLYCERIN  (NITROSTAT ) 0.4 MG SL tablet DISSOLVE ONE TABLET UNDER THE TONGUE EVERY 5 MINUTES AS NEEDED FOR CHEST PAIN.  DO NOT EXCEED A TOTAL OF 3 DOSES IN 15 MINUTES 75 tablet 0   nystatin (MYCOSTATIN/NYSTOP) powder SMARTSIG:1 Application Topical 2-3 Times Daily     prochlorperazine  (COMPAZINE ) 10 MG tablet Take 1 tablet (10 mg total) by mouth every 6 (six) hours as needed for  nausea or vomiting. 30 tablet 1   rosuvastatin  (CRESTOR ) 40 MG tablet Take 1 tablet (40 mg total) by mouth daily. 90 tablet 3   sertraline  (ZOLOFT ) 100 MG tablet Take 200 mg by mouth daily.     tizanidine  (ZANAFLEX ) 2 MG capsule Take 1 capsule (2 mg total) by mouth 3 (three) times daily. 15 capsule 0   No current facility-administered medications for this visit.   No results found.  Review of Systems:   A ROS was performed including pertinent positives and negatives as documented in the HPI.  Physical Exam :   Constitutional: NAD and appears stated age Neurological: Alert and oriented Psych: Appropriate affect and cooperative There were no vitals taken for this visit.   Comprehensive Musculoskeletal Exam:    Positive Durkan's as well as Phalen and Tinel test.  There is thenar atrophy compared to the contralateral side.  She has pain with composite fist   Imaging:     I personally reviewed and interpreted the radiographs.   Assessment and Plan:   58 y.o. female with evidence of right hand carpal tunnel syndrome with evidence of thenar eminence atrophy.  At this time I would like to obtain an EMG and nerve conduction test with my partner Dr. Daisey Dryer.  I would like her to send us  a message once she has completed this and I would plan to get her over to Dr. Alvia Jointer should she have evidence of severe Tunnel syndrome   I personally saw and evaluated the patient, and participated in the management and treatment plan.  Wilhelmenia Harada, MD Attending Physician, Orthopedic Surgery  This document was dictated using Dragon voice recognition software. A reasonable attempt at proof reading has been made to minimize errors.

## 2023-11-16 ENCOUNTER — Encounter (HOSPITAL_BASED_OUTPATIENT_CLINIC_OR_DEPARTMENT_OTHER): Payer: Self-pay | Admitting: Orthopaedic Surgery

## 2023-11-19 ENCOUNTER — Other Ambulatory Visit: Payer: Self-pay

## 2023-11-20 ENCOUNTER — Inpatient Hospital Stay (HOSPITAL_BASED_OUTPATIENT_CLINIC_OR_DEPARTMENT_OTHER): Admitting: Internal Medicine

## 2023-11-20 ENCOUNTER — Inpatient Hospital Stay

## 2023-11-20 VITALS — BP 134/79 | HR 71 | Temp 97.9°F | Resp 16 | Ht 66.0 in | Wt 261.7 lb

## 2023-11-20 DIAGNOSIS — C641 Malignant neoplasm of right kidney, except renal pelvis: Secondary | ICD-10-CM

## 2023-11-20 DIAGNOSIS — J45909 Unspecified asthma, uncomplicated: Secondary | ICD-10-CM | POA: Diagnosis not present

## 2023-11-20 DIAGNOSIS — Z79899 Other long term (current) drug therapy: Secondary | ICD-10-CM | POA: Diagnosis not present

## 2023-11-20 DIAGNOSIS — Z5112 Encounter for antineoplastic immunotherapy: Secondary | ICD-10-CM | POA: Diagnosis not present

## 2023-11-20 DIAGNOSIS — I110 Hypertensive heart disease with heart failure: Secondary | ICD-10-CM | POA: Diagnosis not present

## 2023-11-20 DIAGNOSIS — E7800 Pure hypercholesterolemia, unspecified: Secondary | ICD-10-CM | POA: Diagnosis not present

## 2023-11-20 DIAGNOSIS — G4730 Sleep apnea, unspecified: Secondary | ICD-10-CM | POA: Diagnosis not present

## 2023-11-20 DIAGNOSIS — M1711 Unilateral primary osteoarthritis, right knee: Secondary | ICD-10-CM | POA: Diagnosis not present

## 2023-11-20 DIAGNOSIS — G5600 Carpal tunnel syndrome, unspecified upper limb: Secondary | ICD-10-CM | POA: Diagnosis not present

## 2023-11-20 DIAGNOSIS — R5383 Other fatigue: Secondary | ICD-10-CM | POA: Diagnosis not present

## 2023-11-20 DIAGNOSIS — I5032 Chronic diastolic (congestive) heart failure: Secondary | ICD-10-CM | POA: Diagnosis not present

## 2023-11-20 DIAGNOSIS — E1141 Type 2 diabetes mellitus with diabetic mononeuropathy: Secondary | ICD-10-CM | POA: Diagnosis not present

## 2023-11-20 DIAGNOSIS — Z7984 Long term (current) use of oral hypoglycemic drugs: Secondary | ICD-10-CM | POA: Diagnosis not present

## 2023-11-20 DIAGNOSIS — M791 Myalgia, unspecified site: Secondary | ICD-10-CM | POA: Diagnosis not present

## 2023-11-20 DIAGNOSIS — Z7985 Long-term (current) use of injectable non-insulin antidiabetic drugs: Secondary | ICD-10-CM | POA: Diagnosis not present

## 2023-11-20 DIAGNOSIS — Z905 Acquired absence of kidney: Secondary | ICD-10-CM | POA: Diagnosis not present

## 2023-11-20 LAB — CMP (CANCER CENTER ONLY)
ALT: 21 U/L (ref 0–44)
AST: 17 U/L (ref 15–41)
Albumin: 4.4 g/dL (ref 3.5–5.0)
Alkaline Phosphatase: 46 U/L (ref 38–126)
Anion gap: 10 (ref 5–15)
BUN: 26 mg/dL — ABNORMAL HIGH (ref 6–20)
CO2: 27 mmol/L (ref 22–32)
Calcium: 9.5 mg/dL (ref 8.9–10.3)
Chloride: 100 mmol/L (ref 98–111)
Creatinine: 1.06 mg/dL — ABNORMAL HIGH (ref 0.44–1.00)
GFR, Estimated: >60 mL/min (ref >60)
Glucose, Bld: 199 mg/dL — ABNORMAL HIGH (ref 70–99)
Potassium: 4.3 mmol/L (ref 3.5–5.1)
Sodium: 137 mmol/L (ref 135–145)
Total Bilirubin: 0.6 mg/dL (ref 0.0–1.2)
Total Protein: 7.4 g/dL (ref 6.5–8.1)

## 2023-11-20 LAB — CBC WITH DIFFERENTIAL (CANCER CENTER ONLY)
Abs Immature Granulocytes: 0.03 10*3/uL (ref 0.00–0.07)
Basophils Absolute: 0.1 10*3/uL (ref 0.0–0.1)
Basophils Relative: 1 %
Eosinophils Absolute: 0.2 10*3/uL (ref 0.0–0.5)
Eosinophils Relative: 3 %
HCT: 40.2 % (ref 36.0–46.0)
Hemoglobin: 13.8 g/dL (ref 12.0–15.0)
Immature Granulocytes: 0 %
Lymphocytes Relative: 26 %
Lymphs Abs: 2.2 10*3/uL (ref 0.7–4.0)
MCH: 29.9 pg (ref 26.0–34.0)
MCHC: 34.3 g/dL (ref 30.0–36.0)
MCV: 87.2 fL (ref 80.0–100.0)
Monocytes Absolute: 0.7 10*3/uL (ref 0.1–1.0)
Monocytes Relative: 8 %
Neutro Abs: 5.2 10*3/uL (ref 1.7–7.7)
Neutrophils Relative %: 62 %
Platelet Count: 186 10*3/uL (ref 150–400)
RBC: 4.61 MIL/uL (ref 3.87–5.11)
RDW: 13.1 % (ref 11.5–15.5)
WBC Count: 8.4 10*3/uL (ref 4.0–10.5)
nRBC: 0 % (ref 0.0–0.2)

## 2023-11-20 LAB — TSH: TSH: 5.81 u[IU]/mL — ABNORMAL HIGH (ref 0.350–4.500)

## 2023-11-20 MED ORDER — SODIUM CHLORIDE 0.9 % IV SOLN: Freq: Once | INTRAVENOUS | Status: AC

## 2023-11-20 MED ORDER — SODIUM CHLORIDE 0.9% FLUSH: 10.0000 mL | INTRAVENOUS | Status: DC | PRN

## 2023-11-20 MED ORDER — SODIUM CHLORIDE 0.9 % IV SOLN
200.0000 mg | Freq: Once | INTRAVENOUS | Status: AC
  Administered 2023-11-20: 200 mg via INTRAVENOUS
  Filled 2023-11-20: qty 200

## 2023-11-20 NOTE — Progress Notes (Signed)
 [facility] Telephone:([phone]   [phone]  OFFICE PROGRESS NOTE  Dow Longs, PA-C [address] [place] [address]  DIAGNOSIS:  Stage III (T3a, N0, M0) clear-cell renal cell carcinoma of the right kidney diagnosed in July 2024  PRIOR THERAPY:  status post robotic assisted right radical nephrectomy and the final pathology was consistent with clear-cell renal cell carcinoma with focal rhabdoid features, nuclear grade 4 with tumor size of 10.0 cm and tumor extends into the renal sinus fat and renal vein (pT3a).  The ureteral, vascular and all margins of resection were negative for tumor.   CURRENT THERAPY: Adjuvant treatment with immunotherapy with Keytruda  200 Mg IV every 3 weeks.  First dose was given on 02/13/2023.  Status post 9 cycles.  INTERVAL HISTORY: Christina Cameron 58 y.o. female returns to the clinic today for follow-up visit.Discussed the use of AI scribe software for clinical note transcription with the patient, who gave verbal consent to proceed.  History of Present Illness   Christina Cameron is a 58 year old female with stage three clear cell renal cell carcinoma who presents for evaluation during adjuvant immunotherapy treatment.  She was diagnosed with stage three clear cell renal cell carcinoma in July 2024 and underwent a right radical nephrectomy. She is currently receiving adjuvant treatment with Keytruda  and has completed twelve cycles. She experiences significant fatigue and muscle aches, describing herself as 'real tired' and 'exhausted from keep coming back'. Despite these symptoms, her blood counts remain stable.  She has a history of a large abdominal hernia, which she believes contributes to her discomfort. Additionally, she is experiencing issues with her right hand, suspected to be carpal tunnel syndrome. She has consulted a hand surgeon and is scheduled for a nerve study.  Her thyroid  function has fluctuated during treatment, with TSH  levels initially rising but subsequently normalizing. She is not currently taking any new medications.        MEDICAL HISTORY: Past Medical History:  Diagnosis Date   Arm vein blood clot, unspecified laterality    around 20 years ago as of 08/10/21, Patient doesn't remember what caused it. She was not put on blood thinners.   Arthritis    right knee   Asthma    Cancer (HCC)    cervical 1989   Cancer of kidney (HCC)    Chest pain    a. normal cors by cath in 11/2014 / 03/02/2020 nuclear stress test demonstrated no perfusion defects consistent with prior infart or current ischemia. Normal study.   Chronic diastolic (congestive) heart failure (HCC)    09/27/20 Echocardiogram in Epic showed LVEF 60 to 65%.   Complication of anesthesia    post-operative nausea & vomiting   COVID-19    05/29/19 & 05/2020 Covid infections   Depression    Diabetes mellitus, type 2 (HCC)    Dysrhythmia    heart palpitations   GERD (gastroesophageal reflux disease)    H/O cardiovascular stress test 03/02/2020   03/02/2020 Nuclear stress test in Epic showed no perusion defects consistent with prior infarct or ischemia - normal study.   Headache    migraines   High cholesterol    pt takes Crestor    History of cardiac radiofrequency ablation    Hypertension    Last cardiology office visit, 10/22/20 with Prentice Medley, NP ( see note in Epic) as of 08/09/21.   Morbid (severe) obesity due to excess calories (HCC)  Neuromuscular disorder (HCC)    severe neuropathy in feet   Osteoarthritis of knee, unspecified    Palpitations    PONV (postoperative nausea and vomiting)    Post-COVID syndrome 05/2019   Pt experienced chest tightness, sob, palpitations & dizziness after Covid infection. See 04/2020 note from Prentice Medley, NP in [place].   PTSD (post-traumatic stress disorder)    Raynaud's syndrome    Sleep apnea 09/24/2020   sleep study indicated moderate sleep apea   SVT (supraventricular tachycardia) (HCC)     a. s/p ablation by Dr. Waddell in 2006.   Wears glasses    prescripton reading glasses    ALLERGIES:  has no known allergies.  MEDICATIONS:  Current Outpatient Medications  Medication Sig Dispense Refill   albuterol  (VENTOLIN  HFA) 108 (90 Base) MCG/ACT inhaler Inhale 1-2 puffs into the lungs every 6 (six) hours as needed for wheezing or shortness of breath. 18 g 0   brexpiprazole  (REXULTI ) 2 MG TABS tablet Take 2 mg by mouth daily.     buPROPion  (WELLBUTRIN  XL) 150 MG 24 hr tablet Take 150 mg by mouth every morning.     busPIRone  (BUSPAR ) 10 MG tablet Take 10 mg by mouth 2 (two) times daily.     cetirizine  (ZYRTEC ) 10 MG tablet Take 10 mg by mouth daily as needed for allergies.     cyclobenzaprine  (FLEXERIL ) 5 MG tablet Take 1 tablet (5 mg total) by mouth 3 (three) times daily as needed for muscle spasms. Do not drive or operate a heavy machinery after taking this medicine. 20 tablet 0   ezetimibe  (ZETIA ) 10 MG tablet Take 1 tablet (10 mg total) by mouth daily. 90 tablet 3   furosemide  (LASIX ) 20 MG tablet Take 1 tablet (20 mg total) by mouth daily. (Patient taking differently: Take 20 mg by mouth daily as needed for fluid or edema.) 90 tablet 3   gabapentin  (NEURONTIN ) 300 MG capsule Take 1 capsule (300 mg total) by mouth 3 (three) times daily. Start at bedtime and slowly increase to 3x a day as needed to make sure no side effects like sedation. (Patient taking differently: Take 300 mg by mouth at bedtime. Start at bedtime and slowly increase to 3x a day as needed to make sure no side effects like sedation.) 90 capsule 11   hydrOXYzine  (ATARAX ) 50 MG tablet Take 100 mg by mouth at bedtime.     LORazepam  (ATIVAN ) 0.5 MG tablet TAKE 1 TABLET BY MOUTH ONCE DAILY FOR ANXIETY / PANIC ATTACKS.     metFORMIN  (GLUCOPHAGE ) 500 MG tablet Take 1,000 mg by mouth 2 (two) times daily with a meal.     metoprolol  tartrate (LOPRESSOR ) 50 MG tablet Take 2 tablets (100 mg total) by mouth 2 (two) times daily. 180  tablet 3   MOUNJARO 10 MG/0.5ML Pen Inject 10 mg into the skin once a week.     mupirocin  ointment (BACTROBAN ) 2 % Apply 1 Application topically 2 (two) times daily. 30 g 2   nitroGLYCERIN  (NITROSTAT ) 0.4 MG SL tablet DISSOLVE ONE TABLET UNDER THE TONGUE EVERY 5 MINUTES AS NEEDED FOR CHEST PAIN.  DO NOT EXCEED A TOTAL OF 3 DOSES IN 15 MINUTES 75 tablet 0   nystatin (MYCOSTATIN/NYSTOP) powder SMARTSIG:1 Application Topical 2-3 Times Daily     prochlorperazine  (COMPAZINE ) 10 MG tablet Take 1 tablet (10 mg total) by mouth every 6 (six) hours as needed for nausea or vomiting. 30 tablet 1   rosuvastatin  (CRESTOR ) 40 MG tablet Take 1  tablet (40 mg total) by mouth daily. 90 tablet 3   sertraline  (ZOLOFT ) 100 MG tablet Take 200 mg by mouth daily.     tizanidine  (ZANAFLEX ) 2 MG capsule Take 1 capsule (2 mg total) by mouth 3 (three) times daily. 15 capsule 0   No current facility-administered medications for this visit.    SURGICAL HISTORY:  Past Surgical History:  Procedure Laterality Date   CARDIAC CATHETERIZATION N/A 12/14/2014   Procedure: Right/Left Heart Cath and Coronary Angiography;  Surgeon: Ozell Fell, MD;  Location[facility] INVASIVE CV LAB;  Service: Cardiovascular;  Laterality: N/A;   CARDIAC ELECTROPHYSIOLOGY STUDY AND ABLATION     around 2006, Patient states that heart rate was up to 180 bpm.   CESAREAN SECTION     x2 1990, 2005   CYSTOSCOPY  08/17/2021   Procedure: CYSTOSCOPY;  Surgeon: Sarrah Browning, MD;  Location: [facility];  Service: Gynecology;;   ENDOMETRIAL ABLATION     HYSTEROSCOPY WITH D & C N/A 03/11/2021   Procedure: DILATATION AND CURETTAGE /HYSTEROSCOPY;  Surgeon: Gretta Gums, MD;  Location: [facility] OR;  Service: Gynecology;  Laterality: N/A;   OPERATIVE ULTRASOUND N/A 03/11/2021   Procedure: OPERATIVE ULTRASOUND;  Surgeon: Gretta Gums, MD;  Location: [facility] OR;  Service: Gynecology;  Laterality: N/A;  ultrasound guidance needed   ROBOT ASSISTED  LAPAROSCOPIC NEPHRECTOMY Right 12/25/2022   Procedure: XI ROBOTIC ASSISTED LAPAROSCOPIC NEPHRECTOMY;  Surgeon: Sherrilee Belvie CROME, MD;  Location: [facility] ORS;  Service: Urology;  Laterality: Right;   ROBOTIC ASSISTED LAPAROSCOPIC HYSTERECTOMY AND SALPINGECTOMY Bilateral 08/17/2021   Procedure: XI ROBOTIC ASSISTED LAPAROSCOPIC HYSTERECTOMY AND  BILATERAL SALPINGECTOMY AND OOPHERETOMY;  Surgeon: Sarrah Browning, MD;  Location: [facility];  Service: Gynecology;  Laterality: Bilateral;    REVIEW OF SYSTEMS:  A comprehensive review of systems was negative except for: Constitutional: positive for fatigue   PHYSICAL EXAMINATION: General appearance: alert, cooperative, fatigued, and no distress Head: Normocephalic, without obvious abnormality, atraumatic Neck: no adenopathy, no JVD, supple, symmetrical, trachea midline, and thyroid  not enlarged, symmetric, no tenderness/mass/nodules Lymph nodes: Cervical, supraclavicular, and axillary nodes normal. Resp: clear to auscultation bilaterally Back: symmetric, no curvature. ROM normal. No CVA tenderness. Cardio: regular rate and rhythm, S1, S2 normal, no murmur, click, rub or gallop GI: soft, non-tender; bowel sounds normal; no masses,  no organomegaly Extremities: extremities normal, atraumatic, no cyanosis or edema  ECOG PERFORMANCE STATUS: 1 - Symptomatic but completely ambulatory  Blood pressure 134/79, pulse 71, temperature 97.9 F (36.6 C), temperature source Oral, resp. rate 16, height 5' 6 (1.676 m), weight 261 lb 11.2 oz (118.7 kg), SpO2 98%.  LABORATORY DATA: Lab Results  Component Value Date   WBC 8.4 11/20/2023   HGB 13.8 11/20/2023   HCT 40.2 11/20/2023   MCV 87.2 11/20/2023   PLT 186 11/20/2023      Chemistry      Component Value Date/Time   NA 138 10/30/2023 1403   NA 137 06/08/2023 1049   NA 140 10/19/2011 0919   K 3.9 10/30/2023 1403   K 4.1 10/19/2011 0919   CL 101 10/30/2023 1403   CL 104 10/19/2011 0919    CO2 28 10/30/2023 1403   CO2 24 10/19/2011 0919   BUN 22 (H) 10/30/2023 1403   BUN 21 06/08/2023 1049   BUN 16 10/19/2011 0919   CREATININE 1.00 10/30/2023 1403   CREATININE 0.63 10/19/2011 0919   GLU 297 04/15/2019 0000      Component Value Date/Time   CALCIUM   9.5 10/30/2023 1403   CALCIUM  8.5 10/19/2011 0919   ALKPHOS 46 10/30/2023 1403   ALKPHOS 57 10/19/2011 0919   AST 21 10/30/2023 1403   ALT 16 10/30/2023 1403   ALT 24 10/19/2011 0919   BILITOT 0.6 10/30/2023 1403       RADIOGRAPHIC STUDIES: DG Hand Complete Right Result Date: 11/16/2023 CLINICAL DATA:  Pain, swelling, numbness in right hand for 5-6 months. No known injury. EXAM: RIGHT HAND - COMPLETE 3+ VIEW COMPARISON:  None Available. FINDINGS: Neutral ulnar variance. Minimal degenerative spurring at the distal lateral aspect of the scaphoid. Mild thumb carpometacarpal joint space narrowing and mild peripheral trapezium degenerative osteophytosis. Tiny degenerative mineralized density lateral to the thumb interphalangeal joint. Mild second through fifth DIP joint space narrowing and peripheral osteophytosis. No acute fracture is seen.  No dislocation. IMPRESSION: Mild thumb carpometacarpal and second through fifth DIP osteoarthritis. Electronically Signed   By: Tanda Lyons M.D.   On: 11/16/2023 16:58     ASSESSMENT AND PLAN: This is a very pleasant 58 years old white female recently diagnosed with a stage III (T3a, N0, M0) clear-cell renal cell carcinoma of the right kidney diagnosed in July 2024 status post robotic assisted right radical nephrectomy and the final pathology was consistent with clear-cell renal cell carcinoma with focal rhabdoid features, nuclear grade 4 with tumor size of 10.0 cm and tumor extends into the renal sinus fat and renal vein (pT3a).  The ureteral, vascular and all margins of resection were negative for tumor.  She is currently undergoing adjuvant treatment with immunotherapy with Keytruda  200 Mg  IV every 3 weeks.  She started the first cycle of her treatment on 02/13/2023.  Status post 12 cycles.   The patient has been tolerating this treatment well except for the fatigue. Assessment and Plan    Stage 3 clear cell renal cell carcinoma Stage 3 clear cell renal cell carcinoma diagnosed in July 2024, status post right radical nephrectomy. Currently undergoing adjuvant immunotherapy with Keytruda . Completed 12 cycles, with 4 remaining. Experiencing fatigue and myalgia, common side effects of immunotherapy. Blood counts are well-managed, and thyroid  function (TSH) has normalized after a transient increase, a known effect of immunotherapy. - Administer 13th cycle of Keytruda  today - Monitor blood counts and thyroid  function - Schedule next evaluation in 3 weeks  Large abdominal hernia Large abdominal hernia causing discomfort and potentially contributing to fatigue and myalgia. - Consider surgical intervention post completion of cancer treatment  Carpal tunnel syndrome Carpal tunnel syndrome in the right hand. Consultation with a hand surgeon completed, and she is scheduled for a nerve study. - Proceed with nerve study - Consider surgical intervention post completion of cancer treatment   She was advised to call immediately if she has any concerning symptoms in the interval.  The patient voices understanding of current disease status and treatment options and is in agreement with the current care plan.  All questions were answered. The patient knows to call the clinic with any problems, questions or concerns. We can certainly see the patient much sooner if necessary. The total time spent in the appointment was 20 minutes.  Disclaimer: This note was dictated with voice recognition software. Similar sounding words can inadvertently be transcribed and may not be corrected upon review.

## 2023-11-20 NOTE — Patient Instructions (Signed)

## 2023-11-21 LAB — T4: T4, Total: 6.7 ug/dL (ref 4.5–12.0)

## 2023-11-22 ENCOUNTER — Ambulatory Visit (INDEPENDENT_AMBULATORY_CARE_PROVIDER_SITE_OTHER): Admitting: Podiatry

## 2023-11-22 ENCOUNTER — Encounter: Payer: Self-pay | Admitting: Internal Medicine

## 2023-11-22 DIAGNOSIS — L600 Ingrowing nail: Secondary | ICD-10-CM

## 2023-11-22 DIAGNOSIS — M79675 Pain in left toe(s): Secondary | ICD-10-CM | POA: Diagnosis not present

## 2023-11-22 DIAGNOSIS — B351 Tinea unguium: Secondary | ICD-10-CM

## 2023-11-22 DIAGNOSIS — E1149 Type 2 diabetes mellitus with other diabetic neurological complication: Secondary | ICD-10-CM | POA: Diagnosis not present

## 2023-11-22 DIAGNOSIS — M79674 Pain in right toe(s): Secondary | ICD-10-CM | POA: Diagnosis not present

## 2023-11-22 MED ORDER — CEPHALEXIN 500 MG PO CAPS: 500.0000 mg | ORAL_CAPSULE | Freq: Three times a day (TID) | ORAL | 0 refills | Status: DC

## 2023-11-22 NOTE — Patient Instructions (Signed)

## 2023-11-22 NOTE — Progress Notes (Signed)
 Subjective: Chief Complaint  Patient presents with   [facility]    RM#13 [facility] patient states has lost a toe nail on left foot second toe that has not grown back.Bilateral big toe nail infection concerns.Patient is currently on immunotherapy.   58 year old female presents the office today with concerns of possible infection to her hallux toenail, ingrown toenail.  She does not report any injuries. She tries trimming the nails herself.  She has not called any injuries.  Objective: AAO x3, NAD DP/PT pulses palpable bilaterally, CRT less than 3 seconds Ingrowing present to the hallux toenails left side worse than the right.  Erythema noted to the left lateral hallux toenail without any drainage or pus or ascending cellulitis.  There is no fluctuation or crepitation. Health in general mildly hypertrophic, dystrophic nail discoloration.  No edema, erythema otherwise. No pain with calf compression, swelling, warmth, erythema  Assessment: Ingrown toenails localized erythema; symptomatic onychomycosis  Plan: Ingrown toenail with localized erythema  -We discussed both conservative as well as surgical intervention.  Initially agreed to try to treat this conservatively.  Sharply debrided the nail corner without any complications.  Minimal bleeding occurred.  Recommend Epsom salt soaks and cover with antibiotic ointment and a bandage.  Prescribed cephalexin .  If no improvement recommend partial nail avulsions.  Symptomatic onychomycosis -Sharply debrided nails x 8 without any complications or bleeding.

## 2023-11-30 ENCOUNTER — Telehealth: Admitting: Family Medicine

## 2023-11-30 ENCOUNTER — Encounter (HOSPITAL_BASED_OUTPATIENT_CLINIC_OR_DEPARTMENT_OTHER): Payer: Self-pay | Admitting: Orthopaedic Surgery

## 2023-11-30 DIAGNOSIS — M109 Gout, unspecified: Secondary | ICD-10-CM

## 2023-11-30 MED ORDER — COLCHICINE 0.6 MG PO TABS: ORAL_TABLET | ORAL | 0 refills | Status: DC

## 2023-11-30 NOTE — Progress Notes (Signed)
 E-Visit for Gout Symptoms  We are sorry that you are not feeling well. We are here to help!  Based on what you shared with me it looks like you have a flare of your gout.  Gout is a form of arthritis. It can cause pain and swelling in the joints. At first, it tends to affect only 1 joint - most frequently the big toe. It happens in people who have too much uric acid in the blood. Uric acid is a chemical that is produced when the body breaks down certain foods. Uric acid can form sharp needle-like crystals that build up in the joints and cause pain. Uric acid crystals can also form inside the tubes that carry urine from the kidneys to the bladder. These crystals can turn into kidney stones that can cause pain and problems with the flow of urine. People with gout get sudden flares or attacks of severe pain, most often the big toe, ankle, or knee. Often the joint also turns red and swells. Usually, only 1 joint is affected, but some people have pain in more than 1 joint. Gout flares tend to happen more often during the night.  The pain from gout can be extreme. The pain and swelling are worst at the beginning of a gout flare. The symptoms then get better within a few days to weeks. It is not clear how the body turns off a gout flare.  Do not start any NEW preventative medicine until the gout has cleared completely. However, If you are already on Probenecid or Allopurinol for CHRONIC gout, you may continue taking this during an active flare up  I have prescribed Colchicine  0.6 mg tabs - Take 2 tabs immediately, then 1 tab twice per day for the duration of the flare up to a max of 7 days (but discontinue for stomach pains or diarrhea)   Because you have diabetes and it is not well-controlled, we cannot prescribe a steroid.  The steroid will elevate your glucose and may cause your diabetes symptoms to worsen.    HOME CARE Losing weight can help relieve gout. It's not clear that following a specific  diet plan will help with gout symptoms but eating a balanced diet can help improve your overall health. It can also help you lose weight, if you are overweight. In general, a healthy diet includes plenty of fruits, vegetables, whole grains, and low-fat dairy products (labelled "low fat", skim, 2%). Avoid sugar sweetened drinks (including sodas, tea, juice and juice blends, coffee drinks and sports drinks) Limit alcohol to 1-2 drinks of beer, spirits or wine daily these can make gout flares worse. Some people with gout also have other health problems, such as heart disease, high blood pressure, kidney disease, or obesity. If you have any of these issues, it's important to work with your doctor to manage them. This can help improve your overall health and might also help with your gout.  GET HELP RIGHT AWAY IF: Your symptoms persist after you have completed your treatment plan You develop severe diarrhea You develop abnormal sensations  You develop vomiting,   You develop weakness  You develop abdominal pain  FOLLOW UP WITH YOUR PRIMARY PROVIDER IF: If your symptoms do not improve within 10 days  MAKE SURE YOU  Understand these instructions. Will watch your condition. Will get help right away if you are not doing well or get worse.  Thank you for choosing an e-visit.  Your e-visit answers were reviewed by a  board certified advanced clinical practitioner to complete your personal care plan. Depending upon the condition, your plan could have included both over the counter or prescription medications.  Please review your pharmacy choice. Make sure the pharmacy is open so you can pick up prescription now. If there is a problem, you may contact your provider through [employer] and have the prescription routed to another pharmacy.  Your safety is important to us . If you have drug allergies check your prescription carefully.   For the next 24 hours you can use MyChart to ask questions about  today's visit, request a non-urgent call back, or ask for a work or school excuse. You will get an email in the next two days asking about your experience. I hope that your e-visit has been valuable and will speed your recovery.  I have spent 5 minutes in review of e-visit questionnaire, review and updating patient chart, medical decision making and response to patient.   Roosvelt Mater, PA-C

## 2023-12-03 ENCOUNTER — Encounter: Payer: Self-pay | Admitting: Internal Medicine

## 2023-12-03 ENCOUNTER — Other Ambulatory Visit (HOSPITAL_BASED_OUTPATIENT_CLINIC_OR_DEPARTMENT_OTHER): Payer: Self-pay

## 2023-12-03 ENCOUNTER — Other Ambulatory Visit (HOSPITAL_BASED_OUTPATIENT_CLINIC_OR_DEPARTMENT_OTHER): Payer: Self-pay | Admitting: Orthopaedic Surgery

## 2023-12-03 MED ORDER — METHYLPREDNISOLONE 4 MG PO TBPK
ORAL_TABLET | ORAL | 0 refills | Status: DC
  Filled 2023-12-03: qty 21, 6d supply, fill #0

## 2023-12-04 ENCOUNTER — Other Ambulatory Visit (HOSPITAL_BASED_OUTPATIENT_CLINIC_OR_DEPARTMENT_OTHER): Payer: Self-pay

## 2023-12-04 DIAGNOSIS — E113392 Type 2 diabetes mellitus with moderate nonproliferative diabetic retinopathy without macular edema, left eye: Secondary | ICD-10-CM | POA: Diagnosis not present

## 2023-12-05 ENCOUNTER — Ambulatory Visit (HOSPITAL_BASED_OUTPATIENT_CLINIC_OR_DEPARTMENT_OTHER): Admitting: Orthopaedic Surgery

## 2023-12-06 NOTE — Progress Notes (Signed)
 [facility] OFFICE PROGRESS NOTE  Dow Longs, PA-C [id number] [place] [address]  DIAGNOSIS: Stage III (T3a, N0, M0) clear-cell renal cell carcinoma of the right kidney diagnosed in July 2024   PRIOR THERAPY: Status post robotic assisted right radical nephrectomy and the final pathology was consistent with clear-cell renal cell carcinoma with focal rhabdoid features, nuclear grade 4 with tumor size of 10.0 cm and tumor extends into the renal sinus fat and renal vein (pT3a). The ureteral, vascular and all margins of resection were negative for tumor.   CURRENT THERAPY: Adjuvant treatment with immunotherapy with Keytruda  200 Mg IV every 3 weeks.  First dose was given on 02/13/2023.  Status post 12 cycles.   INTERVAL HISTORY: Christina Cameron 58 y.o. female returns to the clinic today for a follow-up visit.  The patient was last seen in the clinic by Dr. Sherrod on 11/20/23.   She is currently undergoing immunotherapy for renal cell carcinoma.   She has been struggling with abdominal hernia. she is being evaluated by [facility] surgery who recommended referral to [facility] for surgical intervention due to the size and complexity of the hernia. She was told she will need to lose 60 lbs prior to being considered for surgery. She is expected to follow up with them in a few months to reassess her candidacy for surgery. She also has intermittent nausea for which she has Compazine  and Zofran . She denies vomiting.   She experiences swelling in the right hand following treatment. She saw a hand specialist. She is expected to have a nerve conduction study later this week. She had negative DVT study. She believes it is due to her treatment because it flares up after treatment. However, she does not want to stop treatment to reduce the risk of disease recurrence.   She is wondering about prednisone  because it was mentioned with regard to her hand.  However the patient is really worried about  gaining weight because it may prohibit her from getting the hernia surgery.   She reports some fatigue following treatment.  He denies any fever, chills, or signs of infection.  She does get night sweats periodically.  She did have an episode of constipation which resolved with stool softeners.  Dyspnea on exertion is the same which she attributes to her hernia.  Denies any cough, chest pain, or hemoptysis.  Denies any rashes.  She is here today for evaluation and repeat blood work before undergoing cycle #14.   MEDICAL HISTORY: Past Medical History:  Diagnosis Date   Arm vein blood clot, unspecified laterality    around 20 years ago as of 08/10/21, Patient doesn't remember what caused it. She was not put on blood thinners.   Arthritis    right knee   Asthma    Cancer (HCC)    cervical 1989   Cancer of kidney (HCC)    Chest pain    a. normal cors by cath in 11/2014 / 03/02/2020 nuclear stress test demonstrated no perfusion defects consistent with prior infart or current ischemia. Normal study.   Chronic diastolic (congestive) heart failure (HCC)    09/27/20 Echocardiogram in Epic showed LVEF 60 to 65%.   Complication of anesthesia    post-operative nausea & vomiting   COVID-19    05/29/19 & 05/2020 Covid infections   Depression    Diabetes mellitus, type 2 (HCC)    Dysrhythmia    heart palpitations   GERD (gastroesophageal reflux disease)  H/O cardiovascular stress test 03/02/2020   03/02/2020 Nuclear stress test in Epic showed no perusion defects consistent with prior infarct or ischemia - normal study.   Headache    migraines   High cholesterol    pt takes Crestor    History of cardiac radiofrequency ablation    Hypertension    Last cardiology office visit, 10/22/20 with Prentice Medley, NP ( see note in Epic) as of 08/09/21.   Morbid (severe) obesity due to excess calories (HCC)    Neuromuscular disorder (HCC)    severe neuropathy in feet   Osteoarthritis of knee, unspecified     Palpitations    PONV (postoperative nausea and vomiting)    Post-COVID syndrome 05/2019   Pt experienced chest tightness, sob, palpitations & dizziness after Covid infection. See 04/2020 note from Prentice Medley, NP in [place].   PTSD (post-traumatic stress disorder)    Raynaud's syndrome    Sleep apnea 09/24/2020   sleep study indicated moderate sleep apea   SVT (supraventricular tachycardia) (HCC)    a. s/p ablation by Dr. Waddell in 2006.   Wears glasses    prescripton reading glasses    ALLERGIES:  has no known allergies.  MEDICATIONS:  Current Outpatient Medications  Medication Sig Dispense Refill   methylPREDNISolone  (MEDROL  DOSEPAK) 4 MG TBPK tablet Take per packet instructions 21 each 0   albuterol  (VENTOLIN  HFA) 108 (90 Base) MCG/ACT inhaler Inhale 1-2 puffs into the lungs every 6 (six) hours as needed for wheezing or shortness of breath. 18 g 0   brexpiprazole  (REXULTI ) 2 MG TABS tablet Take 2 mg by mouth daily.     buPROPion  (WELLBUTRIN  XL) 150 MG 24 hr tablet Take 150 mg by mouth every morning.     busPIRone  (BUSPAR ) 10 MG tablet Take 10 mg by mouth 2 (two) times daily.     cephALEXin  (KEFLEX ) 500 MG capsule Take 1 capsule (500 mg total) by mouth 3 (three) times daily. 21 capsule 0   cetirizine  (ZYRTEC ) 10 MG tablet Take 10 mg by mouth daily as needed for allergies.     colchicine  0.6 MG tablet Take 2 tabs immediately, then 1 tab twice per day for the duration of the flare up to a max of 7 days (but discontinue for stomach pains or diarrhea) 14 tablet 0   cyclobenzaprine  (FLEXERIL ) 5 MG tablet Take 1 tablet (5 mg total) by mouth 3 (three) times daily as needed for muscle spasms. Do not drive or operate a heavy machinery after taking this medicine. 20 tablet 0   ezetimibe  (ZETIA ) 10 MG tablet Take 1 tablet (10 mg total) by mouth daily. 90 tablet 3   furosemide  (LASIX ) 20 MG tablet Take 1 tablet (20 mg total) by mouth daily. (Patient taking differently: Take 20 mg by mouth daily as  needed for fluid or edema.) 90 tablet 3   gabapentin  (NEURONTIN ) 300 MG capsule Take 1 capsule (300 mg total) by mouth 3 (three) times daily. Start at bedtime and slowly increase to 3x a day as needed to make sure no side effects like sedation. (Patient taking differently: Take 300 mg by mouth at bedtime. Start at bedtime and slowly increase to 3x a day as needed to make sure no side effects like sedation.) 90 capsule 11   hydrOXYzine  (ATARAX ) 50 MG tablet Take 100 mg by mouth at bedtime.     LORazepam  (ATIVAN ) 0.5 MG tablet TAKE 1 TABLET BY MOUTH ONCE DAILY FOR ANXIETY / PANIC ATTACKS.  metFORMIN  (GLUCOPHAGE ) 500 MG tablet Take 1,000 mg by mouth 2 (two) times daily with a meal.     metoprolol  tartrate (LOPRESSOR ) 50 MG tablet Take 2 tablets (100 mg total) by mouth 2 (two) times daily. 180 tablet 3   MOUNJARO 10 MG/0.5ML Pen Inject 10 mg into the skin once a week.     mupirocin  ointment (BACTROBAN ) 2 % Apply 1 Application topically 2 (two) times daily. 30 g 2   nitroGLYCERIN  (NITROSTAT ) 0.4 MG SL tablet DISSOLVE ONE TABLET UNDER THE TONGUE EVERY 5 MINUTES AS NEEDED FOR CHEST PAIN.  DO NOT EXCEED A TOTAL OF 3 DOSES IN 15 MINUTES 75 tablet 0   nystatin (MYCOSTATIN/NYSTOP) powder SMARTSIG:1 Application Topical 2-3 Times Daily     prochlorperazine  (COMPAZINE ) 10 MG tablet Take 1 tablet (10 mg total) by mouth every 6 (six) hours as needed for nausea or vomiting. 30 tablet 1   rosuvastatin  (CRESTOR ) 40 MG tablet Take 1 tablet (40 mg total) by mouth daily. 90 tablet 3   sertraline  (ZOLOFT ) 100 MG tablet Take 200 mg by mouth daily.     tizanidine  (ZANAFLEX ) 2 MG capsule Take 1 capsule (2 mg total) by mouth 3 (three) times daily. 15 capsule 0   No current facility-administered medications for this visit.    SURGICAL HISTORY:  Past Surgical History:  Procedure Laterality Date   CARDIAC CATHETERIZATION N/A 12/14/2014   Procedure: Right/Left Heart Cath and Coronary Angiography;  Surgeon: Ozell Fell,  MD;  Location[facility] INVASIVE CV LAB;  Service: Cardiovascular;  Laterality: N/A;   CARDIAC ELECTROPHYSIOLOGY STUDY AND ABLATION     around 2006, Patient states that heart rate was up to 180 bpm.   CESAREAN SECTION     x2 1990, 2005   CYSTOSCOPY  08/17/2021   Procedure: CYSTOSCOPY;  Surgeon: Sarrah Browning, MD;  Location: [facility];  Service: Gynecology;;   ENDOMETRIAL ABLATION     HYSTEROSCOPY WITH D & C N/A 03/11/2021   Procedure: DILATATION AND CURETTAGE /HYSTEROSCOPY;  Surgeon: Gretta Gums, MD;  Location: [facility] OR;  Service: Gynecology;  Laterality: N/A;   OPERATIVE ULTRASOUND N/A 03/11/2021   Procedure: OPERATIVE ULTRASOUND;  Surgeon: Gretta Gums, MD;  Location: [facility] OR;  Service: Gynecology;  Laterality: N/A;  ultrasound guidance needed   ROBOT ASSISTED LAPAROSCOPIC NEPHRECTOMY Right 12/25/2022   Procedure: XI ROBOTIC ASSISTED LAPAROSCOPIC NEPHRECTOMY;  Surgeon: Sherrilee Belvie CROME, MD;  Location: [facility] ORS;  Service: Urology;  Laterality: Right;   ROBOTIC ASSISTED LAPAROSCOPIC HYSTERECTOMY AND SALPINGECTOMY Bilateral 08/17/2021   Procedure: XI ROBOTIC ASSISTED LAPAROSCOPIC HYSTERECTOMY AND  BILATERAL SALPINGECTOMY AND OOPHERETOMY;  Surgeon: Sarrah Browning, MD;  Location: [facility];  Service: Gynecology;  Laterality: Bilateral;    REVIEW OF SYSTEMS:   Constitutional: Positive for fatigue. Negative for appetite change, chills, fever and unexpected weight change.  HENT: Negative for mouth sores, nosebleeds, sore throat and trouble swallowing.   Eyes: Negative for eye problems and icterus.  Respiratory: Positive for baseline dyspnea. Negative for cough, hemoptysis,  and wheezing.   Cardiovascular: Negative for chest pain and leg swelling. Positive for right hand swelling.  Gastrointestinal: Positive for intermittent nausea and constipation. Negative for abdominal pain, diarrhea, and vomiting.  Positive for large hernia.  Genitourinary: Negative for bladder  incontinence, difficulty urinating, dysuria, frequency and hematuria.   Musculoskeletal: Positive for chronic intermittent low back pain and intermittent swelling in her right hand. Negative for  gait problem, neck pain and neck stiffness.  Skin: Negative for itching and  rash.  Neurological: Negative for dizziness, extremity weakness, gait problem, light-headedness and seizures. \ Hematological: Negative for adenopathy. Does not bruise/bleed easily.  Psychiatric/Behavioral: Negative for confusion, depression and sleep disturbance. The patient is not nervous/anxious.     PHYSICAL EXAMINATION:  There were no vitals taken for this visit.  ECOG PERFORMANCE STATUS: 1  Physical Exam  Constitutional: Oriented to person, place, and time and well-developed, well-nourished, and in no distress. HENT:  Head: Normocephalic and atraumatic.  Mouth/Throat: Oropharynx is clear and moist. No oropharyngeal exudate.  Eyes: Conjunctivae are normal. Right eye exhibits no discharge. Left eye exhibits no discharge. No scleral icterus.  Neck: Normal range of motion. Neck supple.  Cardiovascular: Normal rate, regular rhythm, normal heart sounds and intact distal pulses.   Pulmonary/Chest: Effort normal and breath sounds normal. No respiratory distress. No wheezes. No rales.  Abdominal: Soft. Bowel sounds are normal. Exhibits no distension and no mass. There is no tenderness. Positive for abdominal hernia.  Musculoskeletal: Normal range of motion. Exhibits no edema.  Lymphadenopathy:    No cervical adenopathy.  Neurological: Alert and oriented to person, place, and time. Exhibits normal muscle tone. Gait normal. Coordination normal.  Skin: Skin is warm and dry. No rash noted. Not diaphoretic. No erythema. No pallor.  Psychiatric: Mood, memory and judgment normal.  Vitals reviewed.  LABORATORY DATA: Lab Results  Component Value Date   WBC 8.4 11/20/2023   HGB 13.8 11/20/2023   HCT 40.2 11/20/2023   MCV 87.2  11/20/2023   PLT 186 11/20/2023      Chemistry      Component Value Date/Time   NA 137 11/20/2023 0918   NA 137 06/08/2023 1049   NA 140 10/19/2011 0919   K 4.3 11/20/2023 0918   K 4.1 10/19/2011 0919   CL 100 11/20/2023 0918   CL 104 10/19/2011 0919   CO2 27 11/20/2023 0918   CO2 24 10/19/2011 0919   BUN 26 (H) 11/20/2023 0918   BUN 21 06/08/2023 1049   BUN 16 10/19/2011 0919   CREATININE 1.06 (H) 11/20/2023 0918   CREATININE 0.63 10/19/2011 0919   GLU 297 04/15/2019 0000      Component Value Date/Time   CALCIUM  9.5 11/20/2023 0918   CALCIUM  8.5 10/19/2011 0919   ALKPHOS 46 11/20/2023 0918   ALKPHOS 57 10/19/2011 0919   AST 17 11/20/2023 0918   ALT 21 11/20/2023 0918   ALT 24 10/19/2011 0919   BILITOT 0.6 11/20/2023 0918       RADIOGRAPHIC STUDIES:  DG Hand Complete Right Result Date: 11/16/2023 CLINICAL DATA:  Pain, swelling, numbness in right hand for 5-6 months. No known injury. EXAM: RIGHT HAND - COMPLETE 3+ VIEW COMPARISON:  None Available. FINDINGS: Neutral ulnar variance. Minimal degenerative spurring at the distal lateral aspect of the scaphoid. Mild thumb carpometacarpal joint space narrowing and mild peripheral trapezium degenerative osteophytosis. Tiny degenerative mineralized density lateral to the thumb interphalangeal joint. Mild second through fifth DIP joint space narrowing and peripheral osteophytosis. No acute fracture is seen.  No dislocation. IMPRESSION: Mild thumb carpometacarpal and second through fifth DIP osteoarthritis. Electronically Signed   By: Tanda Lyons M.D.   On: 11/16/2023 16:58     ASSESSMENT/PLAN:  This is a very pleasant 58 year old Caucasian female diagnosed with stage III (T3a, N0, M0) clear-cell renal cell carcinoma\of the right kidney.  The patient was diagnosed in July 2024.  She is status post robot-assisted right radical nephrectomy.  The final pathology showed clear-cell renal cell carcinoma  with focal rhabdoid features,  nuclear grade 4, with a tumor size of 10 cm and the tumor extended into the renal sinus fat and renal vein (P T3a).  The margins were negative for tumor.   The patient is currently on adjuvant immunotherapy with Keytruda  200 mg IV every 3 weeks. She is status post 13 cycles of treatment and has been tolerating it fair.    We talked about the patient's hand swelling following treatment and her treatment.  Discussed that Keytruda  is a flat dose.  Asked about the patient's preference about continuing treatment but the patient would like to continue treatment to do what ever possible to reduce the risk of recurrence of malignancy.  I let the patient know that she would be monitored closely with CT scans either by our office or her urologist.  She sees Dr. Sherrilee.    Labs were reviewed.  Recommend that she proceed with cycle #14 today as scheduled.   We will see her back for follow-up visit in 3 weeks for evaluation repeat blood work before undergoing her next cycle of treatment.   You will contact us  when she has the results of her nerve conduction study.  We talked about steroids.  The patient is reluctant to be on steroids due to concerns with fluid retention and weight gain.  We discussed that if the patient absolutely had to be on prednisone  that would be optimal to be on 10 mg or less with her immunotherapy.   She will see surgery at [facility] to see if she is a candidate for hernia repair surgery.   Constipation Recent impaction resolved with stool softeners. Advised to maintain regular bowel movements. - Use stool softeners regularly. - Titrate stool softener dosage as needed, up to three tablets per day if needed in order to achieve a regular bowel movement. - Use laxatives like MiraLAX  if impacted again.   The patient was advised to call immediately if she has any concerning symptoms in the interval. The patient voices understanding of current disease status and treatment options and is in  agreement with the current care plan. All questions were answered. The patient knows to call the clinic with any problems, questions or concerns. We can certainly see the patient much sooner if necessary      No orders of the defined types were placed in this encounter.    The total time spent in the appointment was 20-29 minutes  Leighanna Kirn L Dillyn Menna, PA-C 12/06/23

## 2023-12-07 ENCOUNTER — Telehealth: Payer: Self-pay

## 2023-12-07 NOTE — Telephone Encounter (Signed)
 Spoke with patient regarding on going symptoms in her right hand. She reported that starting about four treatments ago, she began experiencing swelling in her right hand along with numbness in her fingers and difficulty squeezing them together. She stated that she has seen a hand specialist and is scheduled for a nerve test in a couple of weeks.  Patient also reported that pain medication has caused constipation, so she avoids taking it frequently. Advised her to use stool softeners and Miralax  as needed for relief. She mentioned that there was discussion about possibly using steroids but expressed concern due to her diabetes, as steroids can elevate blood sugar levels.  Informed the patient that I would notify the providers and that recommendations will be relayed to her.

## 2023-12-10 DIAGNOSIS — Z79899 Other long term (current) drug therapy: Secondary | ICD-10-CM | POA: Diagnosis not present

## 2023-12-10 DIAGNOSIS — Z5181 Encounter for therapeutic drug level monitoring: Secondary | ICD-10-CM | POA: Diagnosis not present

## 2023-12-11 ENCOUNTER — Encounter: Payer: Self-pay | Admitting: Internal Medicine

## 2023-12-11 ENCOUNTER — Inpatient Hospital Stay: Attending: Internal Medicine

## 2023-12-11 ENCOUNTER — Inpatient Hospital Stay

## 2023-12-11 ENCOUNTER — Inpatient Hospital Stay (HOSPITAL_BASED_OUTPATIENT_CLINIC_OR_DEPARTMENT_OTHER): Admitting: Physician Assistant

## 2023-12-11 VITALS — BP 150/96 | HR 74 | Temp 97.5°F | Wt 261.0 lb

## 2023-12-11 VITALS — BP 138/77 | HR 73 | Temp 98.2°F | Resp 16

## 2023-12-11 DIAGNOSIS — Z905 Acquired absence of kidney: Secondary | ICD-10-CM | POA: Diagnosis not present

## 2023-12-11 DIAGNOSIS — Z79899 Other long term (current) drug therapy: Secondary | ICD-10-CM | POA: Diagnosis not present

## 2023-12-11 DIAGNOSIS — Z5112 Encounter for antineoplastic immunotherapy: Secondary | ICD-10-CM | POA: Insufficient documentation

## 2023-12-11 DIAGNOSIS — C641 Malignant neoplasm of right kidney, except renal pelvis: Secondary | ICD-10-CM | POA: Insufficient documentation

## 2023-12-11 LAB — CMP (CANCER CENTER ONLY)
ALT: 21 U/L (ref 0–44)
AST: 17 U/L (ref 15–41)
Albumin: 4.5 g/dL (ref 3.5–5.0)
Alkaline Phosphatase: 47 U/L (ref 38–126)
Anion gap: 9 (ref 5–15)
BUN: 25 mg/dL — ABNORMAL HIGH (ref 6–20)
CO2: 26 mmol/L (ref 22–32)
Calcium: 9.7 mg/dL (ref 8.9–10.3)
Chloride: 102 mmol/L (ref 98–111)
Creatinine: 0.92 mg/dL (ref 0.44–1.00)
GFR, Estimated: >60 mL/min (ref >60)
Glucose, Bld: 135 mg/dL — ABNORMAL HIGH (ref 70–99)
Potassium: 3.7 mmol/L (ref 3.5–5.1)
Sodium: 137 mmol/L (ref 135–145)
Total Bilirubin: 0.5 mg/dL (ref 0.0–1.2)
Total Protein: 7.7 g/dL (ref 6.5–8.1)

## 2023-12-11 LAB — CBC WITH DIFFERENTIAL (CANCER CENTER ONLY)
Abs Immature Granulocytes: 0.05 K/uL (ref 0.00–0.07)
Basophils Absolute: 0.1 K/uL (ref 0.0–0.1)
Basophils Relative: 1 %
Eosinophils Absolute: 0.1 K/uL (ref 0.0–0.5)
Eosinophils Relative: 1 %
HCT: 41.2 % (ref 36.0–46.0)
Hemoglobin: 14.3 g/dL (ref 12.0–15.0)
Immature Granulocytes: 1 %
Lymphocytes Relative: 33 %
Lymphs Abs: 3.3 K/uL (ref 0.7–4.0)
MCH: 30 pg (ref 26.0–34.0)
MCHC: 34.7 g/dL (ref 30.0–36.0)
MCV: 86.4 fL (ref 80.0–100.0)
Monocytes Absolute: 0.8 K/uL (ref 0.1–1.0)
Monocytes Relative: 8 %
Neutro Abs: 5.6 K/uL (ref 1.7–7.7)
Neutrophils Relative %: 56 %
Platelet Count: 230 K/uL (ref 150–400)
RBC: 4.77 MIL/uL (ref 3.87–5.11)
RDW: 13.3 % (ref 11.5–15.5)
WBC Count: 9.9 K/uL (ref 4.0–10.5)
nRBC: 0 % (ref 0.0–0.2)

## 2023-12-11 LAB — TSH: TSH: 9.6 u[IU]/mL — ABNORMAL HIGH (ref 0.350–4.500)

## 2023-12-11 MED ORDER — HEPARIN SOD (PORK) LOCK FLUSH 100 UNIT/ML IV SOLN
500.0000 [IU] | Freq: Once | INTRAVENOUS | Status: DC | PRN
Start: 2023-12-11 — End: 2023-12-11

## 2023-12-11 MED ORDER — SODIUM CHLORIDE 0.9 % IV SOLN
Freq: Once | INTRAVENOUS | Status: AC
Start: 2023-12-11 — End: 2023-12-11

## 2023-12-11 MED ORDER — SODIUM CHLORIDE 0.9% FLUSH: 10.0000 mL | INTRAVENOUS | Status: DC | PRN

## 2023-12-11 MED ORDER — SODIUM CHLORIDE 0.9 % IV SOLN
200.0000 mg | Freq: Once | INTRAVENOUS | Status: AC
  Administered 2023-12-11: 200 mg via INTRAVENOUS
  Filled 2023-12-11: qty 200

## 2023-12-11 NOTE — Patient Instructions (Signed)

## 2023-12-12 ENCOUNTER — Other Ambulatory Visit: Payer: Self-pay | Admitting: Medical Oncology

## 2023-12-12 ENCOUNTER — Other Ambulatory Visit: Payer: Self-pay

## 2023-12-12 ENCOUNTER — Other Ambulatory Visit: Payer: Self-pay | Admitting: Internal Medicine

## 2023-12-12 LAB — T4: T4, Total: 8 ug/dL (ref 4.5–12.0)

## 2023-12-12 MED ORDER — LEVOTHYROXINE SODIUM 25 MCG PO TABS: 25.0000 ug | ORAL_TABLET | Freq: Every day | ORAL | 1 refills | Status: DC

## 2023-12-12 NOTE — Progress Notes (Signed)
 MyChart message sent to Dr. Sherrod.

## 2023-12-14 ENCOUNTER — Ambulatory Visit (INDEPENDENT_AMBULATORY_CARE_PROVIDER_SITE_OTHER): Admitting: Physical Medicine and Rehabilitation

## 2023-12-14 DIAGNOSIS — R202 Paresthesia of skin: Secondary | ICD-10-CM

## 2023-12-14 DIAGNOSIS — R531 Weakness: Secondary | ICD-10-CM

## 2023-12-14 DIAGNOSIS — M79641 Pain in right hand: Secondary | ICD-10-CM

## 2023-12-14 DIAGNOSIS — M542 Cervicalgia: Secondary | ICD-10-CM

## 2023-12-14 NOTE — Progress Notes (Signed)
 Christina Cameron - 58 y.o. female MRN [id number]  Date of birth: 02-28-1966  Office Visit Note: Visit Date: 12/14/2023 PCP: Dow Longs, PA-C Referred by: Genelle Standing, MD  Subjective: Chief Complaint  Patient presents with   Right Hand - Numbness, Weakness, Pain   HPI: [name] is a 58 y.o. female who comes in today at the request of Dr. Standing Genelle for evaluation and management of chronic, worsening and severe pain, numbness and tingling in the Right upper extremities.  Patient is Right hand dominant.  She reports today approximately 86-month history of worsening pain numbness and swelling in the right hand.  She relates that the symptoms are in the radial 3 digits of the thumb index and middle finger.  Denies any symptoms in the fifth digit.  Denies any left-sided complaints.  She does have a history of chronic neck pain off and on but no history of neck surgery.  She reports no specific injury.  Her nephrologist initially did order ultrasound for DVT which was negative in April.  She reports symptoms of pretty significant swelling where she cannot make a fist.  She does get pain up into the elbow.  Nothing radiating down the arm.  She has had bracing which has not helped.  Medications have not helped.  She is on multiple medications that would potentially help with nerve pain.  She does have multiple medical issues with history of non-insulin -dependent diabetes.  She carries a diagnosis of polyneuropathy in the feet.  No recent hemoglobin A1c that we have record of.  No prior electrodiagnostic study.    I spent more than 30 minutes speaking face-to-face with the patient with 50% of the time in counseling and discussing coordination of care.      Review of Systems  Musculoskeletal:  Positive for joint pain.  Neurological:  Positive for tingling and weakness.  All other systems reviewed and are negative.  Otherwise per HPI.  Assessment & Plan: Visit Diagnoses:    ICD-10-CM    1. Paresthesia of skin  R20.2 NCV with EMG (electromyography)    2. Pain in right hand  M79.641     3. Weakness  R53.1     4. Cervicalgia  M54.2        Plan: Impression: Clinically seems to consistent with carpal tunnel syndrome of questionable severity but cannot rule out underlying arthritic change and musculotendinous disorder.  Less likely radiculopathy or polyneuropathy.  Electrodiagnostic study performed today.  The above electrodiagnostic study is ABNORMAL and reveals evidence of a severe right median nerve entrapment at the wrist (carpal tunnel syndrome) affecting sensory and motor components.   There is no significant electrodiagnostic evidence of any other focal nerve entrapment, brachial plexopathy or cervical radiculopathy.   Recommendations: 1.  Follow-up with referring physician. 2.  Continue current management of symptoms. 3.  Suggest surgical evaluation.  Meds & Orders: No orders of the defined types were placed in this encounter.   Orders Placed This Encounter  Procedures   NCV with EMG (electromyography)    Follow-up: Return Standing Genelle, MD.   Procedures: No procedures performed  EMG & NCV Findings: Evaluation of the right median motor nerve showed prolonged distal onset latency (7.6 ms), reduced amplitude (3.0 mV), and decreased conduction velocity (Elbow-Wrist, 41 m/s).  The right median (across palm) sensory nerve showed prolonged distal peak latency (Wrist, 9.1 ms), reduced amplitude (8.4 V), and prolonged distal peak latency (Palm, 7.2 ms).  The right ulnar sensory nerve  showed reduced amplitude (11.6 V).  All remaining nerves (as indicated in the following tables) were within normal limits.    All examined muscles (as indicated in the following table) showed no evidence of electrical instability.    Impression: The above electrodiagnostic study is ABNORMAL and reveals evidence of a severe right median nerve entrapment at the wrist (carpal tunnel  syndrome) affecting sensory and motor components.   There is no significant electrodiagnostic evidence of any other focal nerve entrapment, brachial plexopathy or cervical radiculopathy.   Recommendations: 1.  Follow-up with referring physician. 2.  Continue current management of symptoms. 3.  Suggest surgical evaluation.  ___________________________ Prentice Masters FAAPMR Board Certified, American Board of Physical Medicine and Rehabilitation    Nerve Conduction Studies Anti Sensory Summary Table   Stim Site NR Peak (ms) Norm Peak (ms) P-T Amp (V) Norm P-T Amp Site1 Site2 Delta-P (ms) Dist (cm) Vel (m/s) Norm Vel (m/s)  Right Median Acr Palm Anti Sensory (2nd Digit)  30.1C  Wrist    *9.1 <3.6 *8.4 >10 Wrist Palm 1.9 0.0    Palm    *7.2 <2.0 1.9         Right Radial Anti Sensory (Base 1st Digit)  30.5C  Wrist    2.3 <3.1 17.9  Wrist Base 1st Digit 2.3 0.0    Right Ulnar Anti Sensory (5th Digit)  30.8C  Wrist    3.6 <3.7 *11.6 >15.0 Wrist 5th Digit 3.6 14.0 39 >38   Motor Summary Table   Stim Site NR Onset (ms) Norm Onset (ms) O-P Amp (mV) Norm O-P Amp Site1 Site2 Delta-0 (ms) Dist (cm) Vel (m/s) Norm Vel (m/s)  Right Median Motor (Abd Poll Brev)  30.9C  Wrist    *7.6 <4.2 *3.0 >5 Elbow Wrist 4.7 19.5 *41 >50  Elbow    12.3  1.7         Right Ulnar Motor (Abd Dig Min)  30.5C  Wrist    3.6 <4.2 6.8 >3 B Elbow Wrist 3.7 20.0 54 >53  B Elbow    7.3  5.4  A Elbow B Elbow 1.8 11.0 61 >53  A Elbow    9.1  6.0          EMG   Side Muscle Nerve Root Ins Act Fibs Psw Amp Dur Poly Recrt Int Bruna Comment  Right Abd Poll Brev Median C8-T1 Nml Nml Nml Nml Nml 0 Nml Nml   Right 1stDorInt Ulnar C8-T1 Nml Nml Nml Nml Nml 0 Nml Nml   Right PronatorTeres Median C6-7 Nml Nml Nml Nml Nml 0 Nml Nml   Right Biceps Musculocut C5-6 Nml Nml Nml Nml Nml 0 Nml Nml   Right Deltoid Axillary C5-6 Nml Nml Nml Nml Nml 0 Nml Nml     Nerve Conduction Studies Anti Sensory Left/Right Comparison   Stim  Site L Lat (ms) R Lat (ms) L-R Lat (ms) L Amp (V) R Amp (V) L-R Amp (%) Site1 Site2 L Vel (m/s) R Vel (m/s) L-R Vel (m/s)  Median Acr Palm Anti Sensory (2nd Digit)  30.1C  Wrist  *9.1   *8.4  Wrist Palm     Palm  *7.2   1.9        Radial Anti Sensory (Base 1st Digit)  30.5C  Wrist  2.3   17.9  Wrist Base 1st Digit     Ulnar Anti Sensory (5th Digit)  30.8C  Wrist  3.6   *11.6  Wrist 5th Digit  39  Motor Left/Right Comparison   Stim Site L Lat (ms) R Lat (ms) L-R Lat (ms) L Amp (mV) R Amp (mV) L-R Amp (%) Site1 Site2 L Vel (m/s) R Vel (m/s) L-R Vel (m/s)  Median Motor (Abd Poll Brev)  30.9C  Wrist  *7.6   *3.0  Elbow Wrist  *41   Elbow  12.3   1.7        Ulnar Motor (Abd Dig Min)  30.5C  Wrist  3.6   6.8  B Elbow Wrist  54   B Elbow  7.3   5.4  A Elbow B Elbow  61   A Elbow  9.1   6.0           Waveforms:            Clinical History: No specialty comments available.   She reports that she has never smoked. She has been exposed to tobacco smoke. She has never used smokeless tobacco.  No results for input(s): HGBA1C, LABURIC in the last 8760 hours.   Objective:  VS:  HT:    WT:   BMI:     BP:   HR: bpm  TEMP: ( )  RESP:  Physical Exam Vitals and nursing note reviewed.  Constitutional:      General: She is not in acute distress.    Appearance: Normal appearance. She is well-developed. She is obese. She is not ill-appearing.  HENT:     Head: Normocephalic and atraumatic.     Nose: Nose normal.     Mouth/Throat:     Mouth: Mucous membranes are moist.     Pharynx: Oropharynx is clear.  Eyes:     Conjunctiva/sclera: Conjunctivae normal.     Pupils: Pupils are equal, round, and reactive to light.  Cardiovascular:     Rate and Rhythm: Normal rate and regular rhythm.     Pulses: Normal pulses.  Pulmonary:     Effort: Pulmonary effort is normal. No respiratory distress.  Abdominal:     General: There is no distension.     Palpations: Abdomen is soft.      Tenderness: There is no guarding.  Musculoskeletal:        General: No swelling, tenderness or deformity.     Cervical back: Normal range of motion and neck supple.     Right lower leg: No edema.     Left lower leg: No edema.     Comments: Inspection reveals no atrophy of the bilateral APB or FDI or hand intrinsics. There is no swelling, color changes, allodynia or dystrophic changes. There is 5 out of 5 strength in the bilateral wrist extension, finger abduction and long finger flexion. There is equivocally impaired sensation to light touch in the right median nerve distribution. There is a negative Froment's test bilaterally. There is a positive Phalen's test on the right. There is a negative Hoffmann's test bilaterally.  Skin:    General: Skin is warm and dry.     Findings: No erythema or rash.  Neurological:     General: No focal deficit present.     Mental Status: She is alert and oriented to person, place, and time.     Sensory: No sensory deficit.     Motor: No weakness or abnormal muscle tone.     Coordination: Coordination normal.     Gait: Gait normal.  Psychiatric:        Mood and Affect: Mood normal.  Behavior: Behavior normal.        Thought Content: Thought content normal.     Ortho Exam  Imaging: No results found.  Past Medical/Family/Surgical/Social History: Medications & Allergies reviewed per EMR, new medications updated. Patient Active Problem List   Diagnosis Date Noted   Encounter for antineoplastic immunotherapy 04/11/2023   Primary renal cell carcinoma of right kidney (HCC) 01/30/2023   History of right radical nephrectomy 12/28/2022   Right renal mass 12/25/2022   Raynaud's phenomenon 09/06/2022   Sensorineural hearing loss (SNHL) of both ears 09/06/2022   Tinnitus of both ears 09/06/2022   Cardiac chest pain 05/01/2022   Moderate obstructive sleep apnea 01/23/2022   Heart failure with preserved ejection fraction (HCC) 09/19/2021   Abnormal  vaginal bleeding in postmenopausal patient 08/17/2021   Paroxysmal SVT (supraventricular tachycardia) (HCC) 09/17/2019   Upper airway cough syndrome vs atypical asthma/ cough variant  09/17/2019   Anxiety 09/15/2019   Essential hypertension, benign 05/07/2019   Chronic diastolic (congestive) heart failure (HCC)    HLD (hyperlipidemia)    Morbid (severe) obesity due to excess calories (HCC)    Osteoarthritis of knee, unspecified    Palpitations    Somnolence    Type 2 diabetes mellitus with hyperglycemia (HCC)    Raynaud's syndrome    Hypertensive disorder 09/21/2017   Diabetes mellitus (HCC) 09/21/2017   DOE (dyspnea on exertion)    Precordial chest pain 09/08/2014   Past Medical History:  Diagnosis Date   Arm vein blood clot, unspecified laterality    around 20 years ago as of 08/10/21, Patient doesn't remember what caused it. She was not put on blood thinners.   Arthritis    right knee   Asthma    Cancer (HCC)    cervical 1989   Cancer of kidney (HCC)    Chest pain    a. normal cors by cath in 11/2014 / 03/02/2020 nuclear stress test demonstrated no perfusion defects consistent with prior infart or current ischemia. Normal study.   Chronic diastolic (congestive) heart failure (HCC)    09/27/20 Echocardiogram in Epic showed LVEF 60 to 65%.   Complication of anesthesia    post-operative nausea & vomiting   COVID-19    05/29/19 & 05/2020 Covid infections   Depression    Diabetes mellitus, type 2 (HCC)    Dysrhythmia    heart palpitations   GERD (gastroesophageal reflux disease)    H/O cardiovascular stress test 03/02/2020   03/02/2020 Nuclear stress test in Epic showed no perusion defects consistent with prior infarct or ischemia - normal study.   Headache    migraines   High cholesterol    pt takes Crestor    History of cardiac radiofrequency ablation    Hypertension    Last cardiology office visit, 10/22/20 with Prentice Medley, NP ( see note in Epic) as of 08/09/21.   Morbid  (severe) obesity due to excess calories (HCC)    Neuromuscular disorder (HCC)    severe neuropathy in feet   Osteoarthritis of knee, unspecified    Palpitations    PONV (postoperative nausea and vomiting)    Post-COVID syndrome 05/2019   Pt experienced chest tightness, sob, palpitations & dizziness after Covid infection. See 04/2020 note from Prentice Medley, NP in [facility].   PTSD (post-traumatic stress disorder)    Raynaud's syndrome    Sleep apnea 09/24/2020   sleep study indicated moderate sleep apea   SVT (supraventricular tachycardia) (HCC)    a. s/p ablation by Dr. Waddell  in 2006.   Wears glasses    prescripton reading glasses   Family History  Problem Relation Age of Onset   Cancer Mother    Cancer Father    Parkinson's disease Father    Stroke Brother    Hypertension Brother    Cancer - Colon Brother    Past Surgical History:  Procedure Laterality Date   CARDIAC CATHETERIZATION N/A 12/14/2014   Procedure: Right/Left Heart Cath and Coronary Angiography;  Surgeon: Ozell Fell, MD;  Location: [facility] INVASIVE CV LAB;  Service: Cardiovascular;  Laterality: N/A;   CARDIAC ELECTROPHYSIOLOGY STUDY AND ABLATION     around 2006, Patient states that heart rate was up to 180 bpm.   CESAREAN SECTION     x2 1990, 2005   CYSTOSCOPY  08/17/2021   Procedure: CYSTOSCOPY;  Surgeon: Sarrah Browning, MD;  Location: [facility];  Service: Gynecology;;   ENDOMETRIAL ABLATION     HYSTEROSCOPY WITH D & C N/A 03/11/2021   Procedure: DILATATION AND CURETTAGE /HYSTEROSCOPY;  Surgeon: Gretta Gums, MD;  Location: [facility] OR;  Service: Gynecology;  Laterality: N/A;   OPERATIVE ULTRASOUND N/A 03/11/2021   Procedure: OPERATIVE ULTRASOUND;  Surgeon: Gretta Gums, MD;  Location: [facility] OR;  Service: Gynecology;  Laterality: N/A;  ultrasound guidance needed   ROBOT ASSISTED LAPAROSCOPIC NEPHRECTOMY Right 12/25/2022   Procedure: XI ROBOTIC ASSISTED LAPAROSCOPIC NEPHRECTOMY;  Surgeon: Sherrilee Belvie CROME, MD;  Location: [facility];  Service: Urology;  Laterality: Right;   ROBOTIC ASSISTED LAPAROSCOPIC HYSTERECTOMY AND SALPINGECTOMY Bilateral 08/17/2021   Procedure: XI ROBOTIC ASSISTED LAPAROSCOPIC HYSTERECTOMY AND  BILATERAL SALPINGECTOMY AND OOPHERETOMY;  Surgeon: Sarrah Browning, MD;  Location: [facility];  Service: Gynecology;  Laterality: Bilateral;   Social History   Occupational History   Not on file  Tobacco Use   Smoking status: Never    Passive exposure: Past   Smokeless tobacco: Never  Vaping Use   Vaping status: Never Used  Substance and Sexual Activity   Alcohol use: Yes    Comment: seldom   Drug use: No   Sexual activity: Yes    Birth control/protection: None

## 2023-12-14 NOTE — Progress Notes (Signed)
 Pain Scale   Average Pain 6 Patient advise she had numbness and tingling also swelling on her right hand, patient advising this has gotten worse with Chemo-therapy. Patient states she is right hand dominate        +Driver, -BT, -Dye Allergies.

## 2023-12-15 ENCOUNTER — Encounter: Payer: Self-pay | Admitting: Internal Medicine

## 2023-12-16 NOTE — Procedures (Signed)
 EMG & NCV Findings: Evaluation of the right median motor nerve showed prolonged distal onset latency (7.6 ms), reduced amplitude (3.0 mV), and decreased conduction velocity (Elbow-Wrist, 41 m/s).  The right median (across palm) sensory nerve showed prolonged distal peak latency (Wrist, 9.1 ms), reduced amplitude (8.4 V), and prolonged distal peak latency (Palm, 7.2 ms).  The right ulnar sensory nerve showed reduced amplitude (11.6 V).  All remaining nerves (as indicated in the following tables) were within normal limits.    All examined muscles (as indicated in the following table) showed no evidence of electrical instability.    Impression: The above electrodiagnostic study is ABNORMAL and reveals evidence of a severe right median nerve entrapment at the wrist (carpal tunnel syndrome) affecting sensory and motor components.   There is no significant electrodiagnostic evidence of any other focal nerve entrapment, brachial plexopathy or cervical radiculopathy.   Recommendations: 1.  Follow-up with referring physician. 2.  Continue current management of symptoms. 3.  Suggest surgical evaluation.  ___________________________ Prentice Masters FAAPMR Board Certified, American Board of Physical Medicine and Rehabilitation    Nerve Conduction Studies Anti Sensory Summary Table   Stim Site NR Peak (ms) Norm Peak (ms) P-T Amp (V) Norm P-T Amp Site1 Site2 Delta-P (ms) Dist (cm) Vel (m/s) Norm Vel (m/s)  Right Median Acr Palm Anti Sensory (2nd Digit)  30.1C  Wrist    *9.1 <3.6 *8.4 >10 Wrist Palm 1.9 0.0    Palm    *7.2 <2.0 1.9         Right Radial Anti Sensory (Base 1st Digit)  30.5C  Wrist    2.3 <3.1 17.9  Wrist Base 1st Digit 2.3 0.0    Right Ulnar Anti Sensory (5th Digit)  30.8C  Wrist    3.6 <3.7 *11.6 >15.0 Wrist 5th Digit 3.6 14.0 39 >38   Motor Summary Table   Stim Site NR Onset (ms) Norm Onset (ms) O-P Amp (mV) Norm O-P Amp Site1 Site2 Delta-0 (ms) Dist (cm) Vel (m/s) Norm Vel  (m/s)  Right Median Motor (Abd Poll Brev)  30.9C  Wrist    *7.6 <4.2 *3.0 >5 Elbow Wrist 4.7 19.5 *41 >50  Elbow    12.3  1.7         Right Ulnar Motor (Abd Dig Min)  30.5C  Wrist    3.6 <4.2 6.8 >3 B Elbow Wrist 3.7 20.0 54 >53  B Elbow    7.3  5.4  A Elbow B Elbow 1.8 11.0 61 >53  A Elbow    9.1  6.0          EMG   Side Muscle Nerve Root Ins Act Fibs Psw Amp Dur Poly Recrt Int Bruna Comment  Right Abd Poll Brev Median C8-T1 Nml Nml Nml Nml Nml 0 Nml Nml   Right 1stDorInt Ulnar C8-T1 Nml Nml Nml Nml Nml 0 Nml Nml   Right PronatorTeres Median C6-7 Nml Nml Nml Nml Nml 0 Nml Nml   Right Biceps Musculocut C5-6 Nml Nml Nml Nml Nml 0 Nml Nml   Right Deltoid Axillary C5-6 Nml Nml Nml Nml Nml 0 Nml Nml     Nerve Conduction Studies Anti Sensory Left/Right Comparison   Stim Site L Lat (ms) R Lat (ms) L-R Lat (ms) L Amp (V) R Amp (V) L-R Amp (%) Site1 Site2 L Vel (m/s) R Vel (m/s) L-R Vel (m/s)  Median Acr Palm Anti Sensory (2nd Digit)  30.1C  Wrist  *9.1   *8.4  Wrist  Palm     Palm  *7.2   1.9        Radial Anti Sensory (Base 1st Digit)  30.5C  Wrist  2.3   17.9  Wrist Base 1st Digit     Ulnar Anti Sensory (5th Digit)  30.8C  Wrist  3.6   *11.6  Wrist 5th Digit  39    Motor Left/Right Comparison   Stim Site L Lat (ms) R Lat (ms) L-R Lat (ms) L Amp (mV) R Amp (mV) L-R Amp (%) Site1 Site2 L Vel (m/s) R Vel (m/s) L-R Vel (m/s)  Median Motor (Abd Poll Brev)  30.9C  Wrist  *7.6   *3.0  Elbow Wrist  *41   Elbow  12.3   1.7        Ulnar Motor (Abd Dig Min)  30.5C  Wrist  3.6   6.8  B Elbow Wrist  54   B Elbow  7.3   5.4  A Elbow B Elbow  61   A Elbow  9.1   6.0           Waveforms:

## 2023-12-18 ENCOUNTER — Encounter: Payer: Self-pay | Admitting: Urology

## 2023-12-19 ENCOUNTER — Ambulatory Visit (HOSPITAL_BASED_OUTPATIENT_CLINIC_OR_DEPARTMENT_OTHER): Admitting: Student

## 2023-12-19 DIAGNOSIS — G5601 Carpal tunnel syndrome, right upper limb: Secondary | ICD-10-CM | POA: Diagnosis not present

## 2023-12-19 NOTE — Progress Notes (Signed)
 Chief Complaint: Right hand pain     History of Present Illness:   12/19/23: Patient presents today for follow-up of EMG study with Dr. Eldonna.  This did show severe carpal tunnel syndrome at the right wrist.  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  Symptoms have recently worsened after immunotherapy treatments for renal cell carcinoma.  Per oncology, she would be a candidate for surgical intervention if needed when she completes her treatments.  She does experience severe pain and weakness in the right hand and is unable to form a fist.  This is very limiting as she is right-hand dominant.   Christina Cameron is a 58 y.o. female right-hand-dominant female presents with ongoing right hand pain and numbness in the thumb index finger middle finger.  She does have this in the tips of these digits that is worse when she sleeps.  She does feel swollen and pain particular about the thenar eminence.  She has not previously been diagnosed with any carpal tunnel syndrome.  She has been seen by her nephrologist who did rule out a blood clot on the side   PMH/PSH/Family History/Social History/Meds/Allergies:    Past Medical History:  Diagnosis Date   Arm vein blood clot, unspecified laterality    around 20 years ago as of 08/10/21, Patient doesn't remember what caused it. She was not put on blood thinners.   Arthritis    right knee   Asthma    Cancer (HCC)    cervical 1989   Cancer of kidney (HCC)    Chest pain    a. normal cors by cath in 11/2014 / 03/02/2020 nuclear stress test demonstrated no perfusion defects consistent with prior infart or current ischemia. Normal study.   Chronic diastolic (congestive) heart failure (HCC)    09/27/20 Echocardiogram in Epic showed LVEF 60 to 65%.   Complication of anesthesia    post-operative nausea & vomiting   COVID-19    05/29/19 & 05/2020 Covid infections   Depression     Diabetes mellitus, type 2 (HCC)    Dysrhythmia    heart palpitations   GERD (gastroesophageal reflux disease)    H/O cardiovascular stress test 03/02/2020   03/02/2020 Nuclear stress test in Epic showed no perusion defects consistent with prior infarct or ischemia - normal study.   Headache    migraines   High cholesterol    pt takes Crestor    History of cardiac radiofrequency ablation    Hypertension    Last cardiology office visit, 10/22/20 with Prentice Medley, NP ( see note in Epic) as of 08/09/21.   Morbid (severe) obesity due to excess calories (HCC)    Neuromuscular disorder (HCC)    severe neuropathy in feet   Osteoarthritis of knee, unspecified    Palpitations    PONV (postoperative nausea and vomiting)    Post-COVID syndrome 05/2019   Pt experienced chest tightness, sob, palpitations & dizziness after Covid infection. See 04/2020 note from Prentice Medley, NP in [place].   PTSD (post-traumatic stress disorder)    Raynaud's syndrome    Sleep apnea 09/24/2020   sleep study indicated moderate sleep apea   SVT (supraventricular tachycardia) (HCC)    a. s/p ablation by Dr. Waddell in 2006.   Wears glasses    prescripton reading glasses   Past Surgical History:  Procedure Laterality Date   CARDIAC CATHETERIZATION N/A 12/14/2014  Procedure: Right/Left Heart Cath and Coronary Angiography;  Surgeon: Ozell Fell, MD;  Location: [facility] INVASIVE CV LAB;  Service: Cardiovascular;  Laterality: N/A;   CARDIAC ELECTROPHYSIOLOGY STUDY AND ABLATION     around 2006, Patient states that heart rate was up to 180 bpm.   CESAREAN SECTION     x2 1990, 2005   CYSTOSCOPY  08/17/2021   Procedure: CYSTOSCOPY;  Surgeon: Sarrah Browning, MD;  Location: [facility];  Service: Gynecology;;   ENDOMETRIAL ABLATION     HYSTEROSCOPY WITH D & C N/A 03/11/2021   Procedure: DILATATION AND CURETTAGE /HYSTEROSCOPY;  Surgeon: Gretta Gums, MD;  [facility];  Service: Gynecology;  Laterality: N/A;    OPERATIVE ULTRASOUND N/A 03/11/2021   Procedure: OPERATIVE ULTRASOUND;  Surgeon: Gretta Gums, MD;  Location: [facility];  Service: Gynecology;  Laterality: N/A;  ultrasound guidance needed   ROBOT ASSISTED LAPAROSCOPIC NEPHRECTOMY Right 12/25/2022   Procedure: XI ROBOTIC ASSISTED LAPAROSCOPIC NEPHRECTOMY;  Surgeon: Sherrilee Belvie CROME, MD;  Location: [facility];  Service: Urology;  Laterality: Right;   ROBOTIC ASSISTED LAPAROSCOPIC HYSTERECTOMY AND SALPINGECTOMY Bilateral 08/17/2021   Procedure: XI ROBOTIC ASSISTED LAPAROSCOPIC HYSTERECTOMY AND  BILATERAL SALPINGECTOMY AND OOPHERETOMY;  Surgeon: Sarrah Browning, MD;  Location: [facility];  Service: Gynecology;  Laterality: Bilateral;   Social History   Socioeconomic History   Marital status: Married    Spouse name: Not on file   Number of children: Not on file   Years of education: Not on file   Highest education level: Not on file  Occupational History   Not on file  Tobacco Use   Smoking status: Never    Passive exposure: Past   Smokeless tobacco: Never  Vaping Use   Vaping status: Never Used  Substance and Sexual Activity   Alcohol use: Yes    Comment: seldom   Drug use: No   Sexual activity: Yes    Birth control/protection: None  Other Topics Concern   Not on file  Social History Narrative   Caffiene  occasional pepsi 1-2 days.    Education: some college   Work disability: PTSD. Medical.   Social Drivers of Health   Financial Resource Strain: Not on file  Food Insecurity: No Food Insecurity (12/25/2022)   Hunger Vital Sign    Worried About Running Out of Food in the Last Year: Never true    Ran Out of Food in the Last Year: Never true  Transportation Needs: No Transportation Needs (12/25/2022)   PRAPARE - Administrator, Civil Service (Medical): No    Lack of Transportation (Non-Medical): No  Physical Activity: Not on file  Stress: Not on file  Social Connections: Not on file   Family  History  Problem Relation Age of Onset   Cancer Mother    Cancer Father    Parkinson's disease Father    Stroke Brother    Hypertension Brother    Cancer - Colon Brother    No Known Allergies Current Outpatient Medications  Medication Sig Dispense Refill   methylPREDNISolone  (MEDROL  DOSEPAK) 4 MG TBPK tablet Take per packet instructions (Patient not taking: Reported on 12/11/2023) 21 each 0   albuterol  (VENTOLIN  HFA) 108 (90 Base) MCG/ACT inhaler Inhale 1-2 puffs into the lungs every 6 (six) hours as needed for wheezing or shortness of breath. 18 g 0   brexpiprazole  (REXULTI ) 2 MG TABS tablet Take 2 mg by mouth daily.     buPROPion  (WELLBUTRIN  XL) 150 MG 24  hr tablet Take 150 mg by mouth every morning.     busPIRone  (BUSPAR ) 10 MG tablet Take 10 mg by mouth 2 (two) times daily.     cephALEXin  (KEFLEX ) 500 MG capsule Take 1 capsule (500 mg total) by mouth 3 (three) times daily. 21 capsule 0   cetirizine  (ZYRTEC ) 10 MG tablet Take 10 mg by mouth daily as needed for allergies.     colchicine  0.6 MG tablet Take 2 tabs immediately, then 1 tab twice per day for the duration of the flare up to a max of 7 days (but discontinue for stomach pains or diarrhea) 14 tablet 0   cyclobenzaprine  (FLEXERIL ) 5 MG tablet Take 1 tablet (5 mg total) by mouth 3 (three) times daily as needed for muscle spasms. Do not drive or operate a heavy machinery after taking this medicine. 20 tablet 0   ezetimibe  (ZETIA ) 10 MG tablet Take 1 tablet (10 mg total) by mouth daily. 90 tablet 3   furosemide  (LASIX ) 20 MG tablet Take 1 tablet (20 mg total) by mouth daily. (Patient taking differently: Take 20 mg by mouth daily as needed for fluid or edema.) 90 tablet 3   gabapentin  (NEURONTIN ) 300 MG capsule Take 1 capsule (300 mg total) by mouth 3 (three) times daily. Start at bedtime and slowly increase to 3x a day as needed to make sure no side effects like sedation. (Patient taking differently: Take 300 mg by mouth at bedtime. Start  at bedtime and slowly increase to 3x a day as needed to make sure no side effects like sedation.) 90 capsule 11   hydrOXYzine  (ATARAX ) 50 MG tablet Take 100 mg by mouth at bedtime.     lamoTRIgine  (LAMICTAL ) 200 MG tablet Take 200 mg by mouth at bedtime.     levothyroxine  (SYNTHROID ) 25 MCG tablet Take 1 tablet (25 mcg total) by mouth daily before breakfast. 30 tablet 1   LORazepam  (ATIVAN ) 0.5 MG tablet TAKE 1 TABLET BY MOUTH ONCE DAILY FOR ANXIETY / PANIC ATTACKS.     metFORMIN  (GLUCOPHAGE ) 500 MG tablet Take 1,000 mg by mouth 2 (two) times daily with a meal.     metoprolol  tartrate (LOPRESSOR ) 50 MG tablet Take 2 tablets (100 mg total) by mouth 2 (two) times daily. 180 tablet 3   MOUNJARO 10 MG/0.5ML Pen Inject 10 mg into the skin once a week.     mupirocin  ointment (BACTROBAN ) 2 % Apply 1 Application topically 2 (two) times daily. 30 g 2   nitroGLYCERIN  (NITROSTAT ) 0.4 MG SL tablet DISSOLVE ONE TABLET UNDER THE TONGUE EVERY 5 MINUTES AS NEEDED FOR CHEST PAIN.  DO NOT EXCEED A TOTAL OF 3 DOSES IN 15 MINUTES 75 tablet 0   nystatin (MYCOSTATIN/NYSTOP) powder SMARTSIG:1 Application Topical 2-3 Times Daily     prochlorperazine  (COMPAZINE ) 10 MG tablet Take 1 tablet (10 mg total) by mouth every 6 (six) hours as needed for nausea or vomiting. 30 tablet 1   rosuvastatin  (CRESTOR ) 40 MG tablet Take 1 tablet (40 mg total) by mouth daily. 90 tablet 3   sertraline  (ZOLOFT ) 100 MG tablet Take 200 mg by mouth daily.     tizanidine  (ZANAFLEX ) 2 MG capsule Take 1 capsule (2 mg total) by mouth 3 (three) times daily. 15 capsule 0   topiramate (TOPAMAX) 25 MG tablet Take 25 mg by mouth at bedtime.     No current facility-administered medications for this visit.   No results found.  Review of Systems:   A ROS was  performed including pertinent positives and negatives as documented in the HPI.  Physical Exam :   Constitutional: NAD and appears stated age Neurological: Alert and oriented Psych: Appropriate  affect and cooperative There were no vitals taken for this visit.   Comprehensive Musculoskeletal Exam:    Notable thenar atrophy of the right hand with a positive Tinel's test.  Patient unable to form a composite fist with lack of motion particularly at the index and middle fingers.  Decreased sensation to the 1st through 3rd fingers.   Imaging:     Assessment and Plan:   58 y.o. female with severe carpal tunnel syndrome of the right wrist confirmed on recent EMG.  She has failed conservative treatments such as bracing and oral steroids.  Currently undergoing immunotherapy treatments for renal cell carcinoma, so is not likely a candidate for release procedure until she completes treatments however I think this will need to be considered given the severity.  I will plan to send her over to our hand specialist Dr. Arlinda to discuss treatment options including operative versus nonoperative management.  She could possibly benefit from an injection prior to becoming a candidate for surgery.    I personally saw and evaluated the patient, and participated in the management and treatment plan.   Leonce Reveal, PA-C Orthopedics  This document was dictated using Conservation officer, historic buildings. A reasonable attempt at proof reading has been made to minimize errors.

## 2023-12-20 ENCOUNTER — Encounter (HOSPITAL_BASED_OUTPATIENT_CLINIC_OR_DEPARTMENT_OTHER): Payer: Self-pay | Admitting: Orthopaedic Surgery

## 2023-12-21 ENCOUNTER — Other Ambulatory Visit

## 2023-12-21 ENCOUNTER — Encounter: Payer: Self-pay | Admitting: Internal Medicine

## 2023-12-21 ENCOUNTER — Ambulatory Visit: Admitting: Podiatry

## 2023-12-23 ENCOUNTER — Encounter: Payer: Self-pay | Admitting: Physical Medicine and Rehabilitation

## 2023-12-28 ENCOUNTER — Ambulatory Visit: Admitting: Urology

## 2023-12-28 ENCOUNTER — Encounter: Payer: Self-pay | Admitting: Urology

## 2023-12-28 ENCOUNTER — Ambulatory Visit (HOSPITAL_COMMUNITY)
Admission: RE | Admit: 2023-12-28 | Discharge: 2023-12-28 | Disposition: A | Discharge status: Home / Self Care | Source: Ambulatory Visit | Attending: Urology | Admitting: Urology

## 2023-12-28 VITALS — BP 113/69 | HR 79

## 2023-12-28 DIAGNOSIS — C641 Malignant neoplasm of right kidney, except renal pelvis: Secondary | ICD-10-CM | POA: Insufficient documentation

## 2023-12-28 LAB — URINALYSIS, ROUTINE W REFLEX MICROSCOPIC
Bilirubin, UA: NEGATIVE
Ketones, UA: NEGATIVE
Leukocytes,UA: NEGATIVE
Nitrite, UA: NEGATIVE
Protein,UA: NEGATIVE
RBC, UA: NEGATIVE
Specific Gravity, UA: 1.015 (ref 1.005–1.030)
Urobilinogen, Ur: 0.2 mg/dL (ref 0.2–1.0)
pH, UA: 6 (ref 5.0–7.5)

## 2023-12-28 NOTE — Patient Instructions (Signed)
 Kidney Cancer  Kidney cancer is a type of cancer in which an abnormal growth of cells (tumor) forms in one or both kidneys. The kidneys filter waste from your blood and make urine. Kidney cancer may spread to other parts of your body. This type of cancer may also be called renal cell carcinoma. What are the causes? The cause of this condition is not known. What increases the risk? You may be more likely to develop kidney cancer if: You are over age 60. You have certain conditions that are passed from parent to child (inherited). These include von Hippel-Lindau disease, tuberous sclerosis, and hereditary papillary renal carcinoma. You smoke or have been exposed to certain chemicals. You have advanced kidney disease, especially if you need to use a machine to clean your blood (dialysis). You are obese. You have high blood pressure (hypertension). You are female. What are the signs or symptoms? At first, kidney cancer may not cause any symptoms. As the cancer grows, symptoms may include: Blood in the urine. Pain in the upper back or abdomen, just below the rib cage. You may feel pain on one or both sides of your body. Tiredness (fatigue). Weight loss that cannot be explained. Fever. How is this diagnosed? This condition may be diagnosed based on: Your symptoms. Your medical history. A physical exam. You may also have tests, such as: Blood and urine tests. X-rays. Imaging tests, such as CT scans, MRIs, and PET scans. Angiogram. This is when dye is injected into your blood to show your blood vessels. Intravenous pyelogram. This is when dye is injected into your blood to show your kidneys and the other organs that help with making and storing urine. Biopsy. This is when a piece of tissue is removed from your kidney and looked at under a microscope. Your cancer will be assessed (staged), based on how severe it is and how much it has spread. How is this treated? Treatment depends on the type  and stage of the cancer. Treatment may include: Surgery to remove: Just the tumor (nephron-sparing surgery). The entire kidney (nephrectomy). The kidney, some of the healthy tissue around it, nearby lymph nodes, and sometimes the adrenal gland (radical nephrectomy). Medicines to: Kill cancer cells (chemotherapy). Help your body's disease-fighting system (immune system) fight cancer cells. This is known as immunotherapy. Radiation therapy. This uses high-energy rays to kill cancer cells. Targeted therapy to only attack genes and proteins that allow a tumor to grow. This type of therapy tries to limit damage to your healthy cells. Cryoablation. This uses gas or liquid to freeze cancer cells. It is sent through a needle. Radiofrequency ablation. This uses high-energy radio waves to destroy cancer cells. It is sent through a needle-like probe. Embolization. This is a procedure to block the artery that supplies blood to the tumor. Follow these instructions at home: Eating and drinking Some of your treatments may affect your appetite and your ability to chew and swallow. If you have problems eating, or if you do not have an appetite, meet with an expert in diet and nutrition (dietitian). If you have side effects that affect eating, it may help to: Eat smaller meals more often. Eat bland or soft foods. Avoid foods that are hot, spicy, or hard to swallow. Drink shakes or supplements that are high in nutrition and calories. Do not drink alcohol. Lifestyle Do not use any products that contain nicotine or tobacco. These products include cigarettes, chewing tobacco, and vaping devices, such as e-cigarettes. If you need  help quitting, ask your health care provider. Get enough sleep. Most adults need 6-8 hours of sleep each night. During treatment, you may need more sleep. General instructions Take over-the-counter and prescription medicines only as told by your health care provider. Consider joining a  support group. This can help you cope with the stress of having kidney cancer. Work with your health care provider to manage any side effects of treatment. Keep all follow-up visits. Your health care provider will want to make sure your treatment is working. Where to find more information American Cancer Society: cancer.org [employer] (NCI): cancer.gov Contact a health care provider if: You bruise or bleed easily. You lose weight without trying. You have new or worse fatigue or weakness. You have a fever. Your pain suddenly increases. You have more blood in your urine. Your skin or the white parts of your eyes turn yellow (jaundice). Get help right away if: You have chest pain. You are short of breath. You have irregular heartbeats (palpitations). These symptoms may be an emergency. Get help right away. Call 911. Do not wait to see if the symptoms will go away. Do not drive yourself to the hospital. This information is not intended to replace advice given to you by your health care provider. Make sure you discuss any questions you have with your health care provider. Document Revised: 10/25/2021 Document Reviewed: 10/25/2021 Elsevier Patient Education  2024 [employer].

## 2023-12-28 NOTE — Progress Notes (Signed)
 12/28/2023 11:53 AM   Christina Cameron Jan 20, 1966 [id number]  Referring provider: Dow Longs, PA-C [address] [place],  [place] [address]  Followup right RCC   HPI: Christina Cameron is a 57yo here for followup for T3 right TRCC s/p nephrectomy. She has more fatigue compared to last visit. She has 2 keytruda  treatments remaining. CMP was normal. CXR shows possible 5mm left lobe mass.   PMH: Past Medical History:  Diagnosis Date   Arm vein blood clot, unspecified laterality    around 20 years ago as of 08/10/21, Patient doesn't remember what caused it. She was not put on blood thinners.   Arthritis    right knee   Asthma    Cancer (HCC)    cervical 1989   Cancer of kidney (HCC)    Chest pain    a. normal cors by cath in 11/2014 / 03/02/2020 nuclear stress test demonstrated no perfusion defects consistent with prior infart or current ischemia. Normal study.   Chronic diastolic (congestive) heart failure (HCC)    09/27/20 Echocardiogram in Epic showed LVEF 60 to 65%.   Complication of anesthesia    post-operative nausea & vomiting   COVID-19    05/29/19 & 05/2020 Covid infections   Depression    Diabetes mellitus, type 2 (HCC)    Dysrhythmia    heart palpitations   GERD (gastroesophageal reflux disease)    H/O cardiovascular stress test 03/02/2020   03/02/2020 Nuclear stress test in Epic showed no perusion defects consistent with prior infarct or ischemia - normal study.   Headache    migraines   High cholesterol    pt takes Crestor    History of cardiac radiofrequency ablation    Hypertension    Last cardiology office visit, 10/22/20 with Prentice Medley, NP ( see note in Epic) as of 08/09/21.   Morbid (severe) obesity due to excess calories (HCC)    Neuromuscular disorder (HCC)    severe neuropathy in feet   Osteoarthritis of knee, unspecified    Palpitations    PONV (postoperative nausea and vomiting)    Post-COVID syndrome 05/2019   Pt experienced chest tightness, sob, palpitations &  dizziness after Covid infection. See 04/2020 note from Prentice Medley, NP in [place].   PTSD (post-traumatic stress disorder)    Raynaud's syndrome    Sleep apnea 09/24/2020   sleep study indicated moderate sleep apea   SVT (supraventricular tachycardia) (HCC)    a. s/p ablation by Dr. Waddell in 2006.   Wears glasses    prescripton reading glasses    Surgical History: Past Surgical History:  Procedure Laterality Date   CARDIAC CATHETERIZATION N/A 12/14/2014   Procedure: Right/Left Heart Cath and Coronary Angiography;  Surgeon: Ozell Fell, MD;  Location: [facility] INVASIVE CV LAB;  Service: Cardiovascular;  Laterality: N/A;   CARDIAC ELECTROPHYSIOLOGY STUDY AND ABLATION     around 2006, Patient states that heart rate was up to 180 bpm.   CESAREAN SECTION     x2 1990, 2005   CYSTOSCOPY  08/17/2021   Procedure: CYSTOSCOPY;  Surgeon: Sarrah Browning, MD;  Location: [facility];  Service: Gynecology;;   ENDOMETRIAL ABLATION     HYSTEROSCOPY WITH D & C N/A 03/11/2021   Procedure: DILATATION AND CURETTAGE /HYSTEROSCOPY;  Surgeon: Gretta Gums, MD;  Location: [facility];  Service: Gynecology;  Laterality: N/A;   OPERATIVE ULTRASOUND N/A 03/11/2021   Procedure: OPERATIVE ULTRASOUND;  Surgeon: Gretta Gums, MD;  Location: [facility];  Service: Gynecology;  Laterality: N/A;  ultrasound guidance needed   ROBOT ASSISTED LAPAROSCOPIC NEPHRECTOMY Right 12/25/2022   Procedure: XI ROBOTIC ASSISTED LAPAROSCOPIC NEPHRECTOMY;  Surgeon: Sherrilee Belvie CROME, MD;  Location: [facility];  Service: Urology;  Laterality: Right;   ROBOTIC ASSISTED LAPAROSCOPIC HYSTERECTOMY AND SALPINGECTOMY Bilateral 08/17/2021   Procedure: XI ROBOTIC ASSISTED LAPAROSCOPIC HYSTERECTOMY AND  BILATERAL SALPINGECTOMY AND OOPHERETOMY;  Surgeon: Sarrah Browning, MD;  Location: [facility];  Service: Gynecology;  Laterality: Bilateral;    Home Medications:  Allergies as of 12/28/2023   No Known Allergies       Medication List        Accurate as of December 28, 2023 11:53 AM. If you have any questions, ask your nurse or doctor.          albuterol  108 (90 Base) MCG/ACT inhaler Commonly known as: VENTOLIN  HFA Inhale 1-2 puffs into the lungs every 6 (six) hours as needed for wheezing or shortness of breath.   brexpiprazole  2 MG Tabs tablet Commonly known as: REXULTI  Take 2 mg by mouth daily.   buPROPion  150 MG 24 hr tablet Commonly known as: WELLBUTRIN  XL Take 150 mg by mouth every morning.   busPIRone  10 MG tablet Commonly known as: BUSPAR  Take 10 mg by mouth 2 (two) times daily.   cephALEXin  500 MG capsule Commonly known as: KEFLEX  Take 1 capsule (500 mg total) by mouth 3 (three) times daily.   cetirizine  10 MG tablet Commonly known as: ZYRTEC  Take 10 mg by mouth daily as needed for allergies.   colchicine  0.6 MG tablet Take 2 tabs immediately, then 1 tab twice per day for the duration of the flare up to a max of 7 days (but discontinue for stomach pains or diarrhea)   cyclobenzaprine  5 MG tablet Commonly known as: FLEXERIL  Take 1 tablet (5 mg total) by mouth 3 (three) times daily as needed for muscle spasms. Do not drive or operate a heavy machinery after taking this medicine.   ezetimibe  10 MG tablet Commonly known as: ZETIA  Take 1 tablet (10 mg total) by mouth daily.   furosemide  20 MG tablet Commonly known as: LASIX  Take 1 tablet (20 mg total) by mouth daily. What changed:  when to take this reasons to take this   gabapentin  300 MG capsule Commonly known as: NEURONTIN  Take 1 capsule (300 mg total) by mouth 3 (three) times daily. Start at bedtime and slowly increase to 3x a day as needed to make sure no side effects like sedation. What changed: when to take this   hydrOXYzine  50 MG tablet Commonly known as: ATARAX  Take 100 mg by mouth at bedtime.   lamoTRIgine  200 MG tablet Commonly known as: LAMICTAL  Take 200 mg by mouth at bedtime.   levothyroxine  25 MCG  tablet Commonly known as: SYNTHROID  Take 1 tablet (25 mcg total) by mouth daily before breakfast.   LORazepam  0.5 MG tablet Commonly known as: ATIVAN  TAKE 1 TABLET BY MOUTH ONCE DAILY FOR ANXIETY / PANIC ATTACKS.   metFORMIN  500 MG tablet Commonly known as: GLUCOPHAGE  Take 1,000 mg by mouth 2 (two) times daily with a meal.   methylPREDNISolone  4 MG Tbpk tablet Commonly known as: MEDROL  DOSEPAK Take per packet instructions   metoprolol  tartrate 50 MG tablet Commonly known as: LOPRESSOR  Take 2 tablets (100 mg total) by mouth 2 (two) times daily.   Mounjaro 10 MG/0.5ML Pen Generic drug: tirzepatide Inject 10 mg into the skin once a week.   mupirocin  ointment 2 % Commonly known  as: BACTROBAN  Apply 1 Application topically 2 (two) times daily.   nitroGLYCERIN  0.4 MG SL tablet Commonly known as: NITROSTAT  DISSOLVE ONE TABLET UNDER THE TONGUE EVERY 5 MINUTES AS NEEDED FOR CHEST PAIN.  DO NOT EXCEED A TOTAL OF 3 DOSES IN 15 MINUTES   nystatin powder Commonly known as: MYCOSTATIN/NYSTOP SMARTSIG:1 Application Topical 2-3 Times Daily   prochlorperazine  10 MG tablet Commonly known as: COMPAZINE  Take 1 tablet (10 mg total) by mouth every 6 (six) hours as needed for nausea or vomiting.   rosuvastatin  40 MG tablet Commonly known as: CRESTOR  Take 1 tablet (40 mg total) by mouth daily.   sertraline  100 MG tablet Commonly known as: ZOLOFT  Take 200 mg by mouth daily.   tizanidine  2 MG capsule Commonly known as: ZANAFLEX  Take 1 capsule (2 mg total) by mouth 3 (three) times daily.   topiramate 25 MG tablet Commonly known as: TOPAMAX Take 25 mg by mouth at bedtime.        Allergies: No Known Allergies  Family History: Family History  Problem Relation Age of Onset   Cancer Mother    Cancer Father    Parkinson's disease Father    Stroke Brother    Hypertension Brother    Cancer - Colon Brother     Social History:  reports that she has never smoked. She has been  exposed to tobacco smoke. She has never used smokeless tobacco. She reports current alcohol use. She reports that she does not use drugs.  ROS: All other review of systems were reviewed and are negative except what is noted above in HPI  Physical Exam: BP 113/69   Pulse 79   Constitutional:  Alert and oriented, No acute distress. HEENT: Felt AT, moist mucus membranes.  Trachea midline, no masses. Cardiovascular: No clubbing, cyanosis, or edema. Respiratory: Normal respiratory effort, no increased work of breathing. GI: Abdomen is soft, nontender, nondistended, no abdominal masses GU: No CVA tenderness.  Lymph: No cervical or inguinal lymphadenopathy. Skin: No rashes, bruises or suspicious lesions. Neurologic: Grossly intact, no focal deficits, moving all 4 extremities. Psychiatric: Normal mood and affect.  Laboratory Data: Lab Results  Component Value Date   WBC 9.9 12/11/2023   HGB 14.3 12/11/2023   HCT 41.2 12/11/2023   MCV 86.4 12/11/2023   PLT 230 12/11/2023    Lab Results  Component Value Date   CREATININE 0.92 12/11/2023    No results found for: PSA  No results found for: TESTOSTERONE  Lab Results  Component Value Date   HGBA1C 8.0 (H) 12/15/2022    Urinalysis    Component Value Date/Time   COLORURINE YELLOW 07/07/2018 0812   APPEARANCEUR Clear 09/21/2023 1153   LABSPEC 1.020 07/07/2018 0812   PHURINE 5.5 07/07/2018 0812   GLUCOSEU Negative 09/21/2023 1153   HGBUR NEGATIVE 07/07/2018 0812   BILIRUBINUR Negative 09/21/2023 1153   KETONESUR negative 11/28/2019 1455   KETONESUR TRACE (A) 07/07/2018 0812   PROTEINUR Negative 09/21/2023 1153   PROTEINUR NEGATIVE 07/07/2018 0812   UROBILINOGEN 0.2 11/28/2019 1455   UROBILINOGEN 0.2 04/28/2014 0331   NITRITE Negative 09/21/2023 1153   NITRITE NEGATIVE 07/07/2018 0812   LEUKOCYTESUR Trace (A) 09/21/2023 1153    Lab Results  Component Value Date   LABMICR See below: 09/21/2023   WBCUA 0-5 09/21/2023    LABEPIT 0-10 09/21/2023   BACTERIA Few 09/21/2023    Pertinent Imaging:  No results found for this or any previous visit.  No results found for this or any  previous visit.  No results found for this or any previous visit.  No results found for this or any previous visit.  No results found for this or any previous visit.  No results found for this or any previous visit.  No results found for this or any previous visit.  No results found for this or any previous visit.   Assessment & Plan:    1. Renal cell carcinoma of right kidney (HCC) (Primary) We will await CXR read. If normal I will see her back in 6 months with CXR, CMP, CT abd - Urinalysis, Routine w reflex microscopic   No follow-ups on file.  Belvie Clara, MD  [facility] [place]

## 2023-12-29 ENCOUNTER — Other Ambulatory Visit: Payer: Self-pay

## 2024-01-01 ENCOUNTER — Inpatient Hospital Stay (HOSPITAL_BASED_OUTPATIENT_CLINIC_OR_DEPARTMENT_OTHER): Admitting: Internal Medicine

## 2024-01-01 ENCOUNTER — Inpatient Hospital Stay

## 2024-01-01 ENCOUNTER — Inpatient Hospital Stay: Attending: Internal Medicine

## 2024-01-01 VITALS — BP 135/82 | HR 82 | Temp 98.0°F | Resp 16 | Ht 66.0 in | Wt 266.0 lb

## 2024-01-01 DIAGNOSIS — E032 Hypothyroidism due to medicaments and other exogenous substances: Secondary | ICD-10-CM | POA: Diagnosis not present

## 2024-01-01 DIAGNOSIS — C641 Malignant neoplasm of right kidney, except renal pelvis: Secondary | ICD-10-CM | POA: Insufficient documentation

## 2024-01-01 DIAGNOSIS — Z905 Acquired absence of kidney: Secondary | ICD-10-CM | POA: Insufficient documentation

## 2024-01-01 DIAGNOSIS — Z79890 Hormone replacement therapy (postmenopausal): Secondary | ICD-10-CM | POA: Insufficient documentation

## 2024-01-01 DIAGNOSIS — Z5112 Encounter for antineoplastic immunotherapy: Secondary | ICD-10-CM | POA: Diagnosis not present

## 2024-01-01 DIAGNOSIS — Z79899 Other long term (current) drug therapy: Secondary | ICD-10-CM | POA: Diagnosis not present

## 2024-01-01 LAB — CMP (CANCER CENTER ONLY)
ALT: 16 U/L (ref 0–44)
AST: 14 U/L — ABNORMAL LOW (ref 15–41)
Albumin: 4.2 g/dL (ref 3.5–5.0)
Alkaline Phosphatase: 46 U/L (ref 38–126)
Anion gap: 9 (ref 5–15)
BUN: 18 mg/dL (ref 6–20)
CO2: 27 mmol/L (ref 22–32)
Calcium: 8.8 mg/dL — ABNORMAL LOW (ref 8.9–10.3)
Chloride: 100 mmol/L (ref 98–111)
Creatinine: 0.94 mg/dL (ref 0.44–1.00)
GFR, Estimated: >60 mL/min (ref >60)
Glucose, Bld: 323 mg/dL — ABNORMAL HIGH (ref 70–99)
Potassium: 4.1 mmol/L (ref 3.5–5.1)
Sodium: 136 mmol/L (ref 135–145)
Total Bilirubin: 0.5 mg/dL (ref 0.0–1.2)
Total Protein: 7 g/dL (ref 6.5–8.1)

## 2024-01-01 LAB — CBC WITH DIFFERENTIAL (CANCER CENTER ONLY)
Abs Immature Granulocytes: 0.04 K/uL (ref 0.00–0.07)
Basophils Absolute: 0 K/uL (ref 0.0–0.1)
Basophils Relative: 1 %
Eosinophils Absolute: 0.2 K/uL (ref 0.0–0.5)
Eosinophils Relative: 2 %
HCT: 39.1 % (ref 36.0–46.0)
Hemoglobin: 13.5 g/dL (ref 12.0–15.0)
Immature Granulocytes: 1 %
Lymphocytes Relative: 21 %
Lymphs Abs: 1.6 K/uL (ref 0.7–4.0)
MCH: 29.4 pg (ref 26.0–34.0)
MCHC: 34.5 g/dL (ref 30.0–36.0)
MCV: 85.2 fL (ref 80.0–100.0)
Monocytes Absolute: 0.6 K/uL (ref 0.1–1.0)
Monocytes Relative: 7 %
Neutro Abs: 5.4 K/uL (ref 1.7–7.7)
Neutrophils Relative %: 68 %
Platelet Count: 190 K/uL (ref 150–400)
RBC: 4.59 MIL/uL (ref 3.87–5.11)
RDW: 13.3 % (ref 11.5–15.5)
WBC Count: 7.8 K/uL (ref 4.0–10.5)
nRBC: 0 % (ref 0.0–0.2)

## 2024-01-01 LAB — TSH: TSH: 3.75 u[IU]/mL (ref 0.350–4.500)

## 2024-01-01 MED ORDER — SODIUM CHLORIDE 0.9 % IV SOLN: Freq: Once | INTRAVENOUS | Status: AC

## 2024-01-01 MED ORDER — SODIUM CHLORIDE 0.9 % IV SOLN
200.0000 mg | Freq: Once | INTRAVENOUS | Status: AC
  Administered 2024-01-01: 200 mg via INTRAVENOUS
  Filled 2024-01-01: qty 200

## 2024-01-01 NOTE — Patient Instructions (Signed)

## 2024-01-01 NOTE — Progress Notes (Signed)
 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  The ureteral, vascular and all margins of resection were negative for tumor.   CURRENT THERAPY: Adjuvant treatment with immunotherapy with Keytruda  200 Mg IV every 3 weeks.  First dose was given on 02/13/2023.  Status post 14 cycles.  INTERVAL HISTORY: Christina Cameron 58 y.o. female returns to the clinic today for follow-up visit.Discussed the use of AI scribe software for clinical note transcription with the patient, who gave verbal consent to proceed.  History of Present Illness Christina Cameron is a 58 year old female who presents for evaluation before starting cycle number fifteen of immunotherapy.  She has recently started levothyroxine  at a dose of 25 micrograms, which has alleviated her night sweats. However, she continues to experience significant fatigue and weakness in her legs. No other changes in her medication regimen aside from the addition of levothyroxine .  A recent visit to her nephrologist resulted in a chest x-ray; her kidney doctor showed her that the scan was different from the previous one, but she has not yet received the radiologist's report. She has not yet received the radiologist's report. The x-ray was performed at [facility] last week.      MEDICAL HISTORY: Past Medical History:  Diagnosis Date   Arm vein blood clot, unspecified laterality    around 20 years ago as of 08/10/21, Patient doesn't remember what caused  it. She was not put on blood thinners.   Arthritis    right knee   Asthma    Cancer (HCC)    cervical 1989   Cancer of kidney (HCC)    Chest pain    a. normal cors by cath in 11/2014 / 03/02/2020 nuclear stress test demonstrated no perfusion defects consistent with prior infart or current ischemia. Normal study.   Chronic diastolic (congestive) heart failure (HCC)    09/27/20 Echocardiogram in Epic showed LVEF 60 to 65%.   Complication of anesthesia    post-operative nausea & vomiting   COVID-19    05/29/19 & 05/2020 Covid infections   Depression    Diabetes mellitus, type 2 (HCC)    Dysrhythmia    heart palpitations   GERD (gastroesophageal reflux disease)    H/O cardiovascular stress test 03/02/2020   03/02/2020 Nuclear stress test in Epic showed no perusion defects consistent with prior infarct or ischemia - normal study.   Headache    migraines   High cholesterol    pt takes Crestor    History of cardiac radiofrequency ablation    Hypertension    Last cardiology office visit, 10/22/20 with Prentice Medley, NP ( see note in Epic) as of 08/09/21.   Morbid (severe) obesity due to excess calories (HCC)    Neuromuscular disorder (HCC)    severe neuropathy in feet   Osteoarthritis of knee, unspecified    Palpitations    PONV (postoperative nausea and vomiting)    Post-COVID syndrome 05/2019   Pt experienced chest tightness, sob, palpitations &  dizziness after Covid infection. See 04/2020 note from Prentice Medley, NP in [place].   PTSD (post-traumatic stress disorder)    Raynaud's syndrome    Sleep apnea 09/24/2020   sleep study indicated moderate sleep apea   SVT (supraventricular tachycardia) (HCC)    a. s/p ablation by Dr. Waddell in 2006.   Wears glasses    prescripton reading glasses    ALLERGIES:  has no known allergies.  MEDICATIONS:  Current Outpatient Medications  Medication Sig Dispense Refill   methylPREDNISolone  (MEDROL  DOSEPAK) 4 MG TBPK tablet Take per packet  instructions (Patient not taking: Reported on 12/11/2023) 21 each 0   albuterol  (VENTOLIN  HFA) 108 (90 Base) MCG/ACT inhaler Inhale 1-2 puffs into the lungs every 6 (six) hours as needed for wheezing or shortness of breath. 18 g 0   brexpiprazole  (REXULTI ) 2 MG TABS tablet Take 2 mg by mouth daily.     buPROPion  (WELLBUTRIN  XL) 150 MG 24 hr tablet Take 150 mg by mouth every morning.     busPIRone  (BUSPAR ) 10 MG tablet Take 10 mg by mouth 2 (two) times daily.     cephALEXin  (KEFLEX ) 500 MG capsule Take 1 capsule (500 mg total) by mouth 3 (three) times daily. 21 capsule 0   cetirizine  (ZYRTEC ) 10 MG tablet Take 10 mg by mouth daily as needed for allergies.     colchicine  0.6 MG tablet Take 2 tabs immediately, then 1 tab twice per day for the duration of the flare up to a max of 7 days (but discontinue for stomach pains or diarrhea) 14 tablet 0   cyclobenzaprine  (FLEXERIL ) 5 MG tablet Take 1 tablet (5 mg total) by mouth 3 (three) times daily as needed for muscle spasms. Do not drive or operate a heavy machinery after taking this medicine. 20 tablet 0   ezetimibe  (ZETIA ) 10 MG tablet Take 1 tablet (10 mg total) by mouth daily. 90 tablet 3   furosemide  (LASIX ) 20 MG tablet Take 1 tablet (20 mg total) by mouth daily. (Patient taking differently: Take 20 mg by mouth daily as needed for fluid or edema.) 90 tablet 3   gabapentin  (NEURONTIN ) 300 MG capsule Take 1 capsule (300 mg total) by mouth 3 (three) times daily. Start at bedtime and slowly increase to 3x a day as needed to make sure no side effects like sedation. (Patient taking differently: Take 300 mg by mouth at bedtime. Start at bedtime and slowly increase to 3x a day as needed to make sure no side effects like sedation.) 90 capsule 11   hydrOXYzine  (ATARAX ) 50 MG tablet Take 100 mg by mouth at bedtime.     lamoTRIgine  (LAMICTAL ) 200 MG tablet Take 200 mg by mouth at bedtime.     levothyroxine  (SYNTHROID ) 25 MCG tablet Take 1 tablet (25 mcg total) by  mouth daily before breakfast. 30 tablet 1   LORazepam  (ATIVAN ) 0.5 MG tablet TAKE 1 TABLET BY MOUTH ONCE DAILY FOR ANXIETY / PANIC ATTACKS.     metFORMIN  (GLUCOPHAGE ) 500 MG tablet Take 1,000 mg by mouth 2 (two) times daily with a meal.     metoprolol  tartrate (LOPRESSOR ) 50 MG tablet Take 2 tablets (100 mg total) by mouth 2 (two) times daily. 180 tablet 3   MOUNJARO 10 MG/0.5ML Pen Inject 10 mg into the skin once a week.     mupirocin  ointment (BACTROBAN ) 2 % Apply 1 Application topically 2 (two) times daily. 30 g 2   nitroGLYCERIN  (NITROSTAT ) 0.4 MG SL tablet DISSOLVE ONE  TABLET UNDER THE TONGUE EVERY 5 MINUTES AS NEEDED FOR CHEST PAIN.  DO NOT EXCEED A TOTAL OF 3 DOSES IN 15 MINUTES 75 tablet 0   nystatin (MYCOSTATIN/NYSTOP) powder SMARTSIG:1 Application Topical 2-3 Times Daily     prochlorperazine  (COMPAZINE ) 10 MG tablet Take 1 tablet (10 mg total) by mouth every 6 (six) hours as needed for nausea or vomiting. 30 tablet 1   rosuvastatin  (CRESTOR ) 40 MG tablet Take 1 tablet (40 mg total) by mouth daily. 90 tablet 3   sertraline  (ZOLOFT ) 100 MG tablet Take 200 mg by mouth daily.     tizanidine  (ZANAFLEX ) 2 MG capsule Take 1 capsule (2 mg total) by mouth 3 (three) times daily. 15 capsule 0   topiramate (TOPAMAX) 25 MG tablet Take 25 mg by mouth at bedtime.     No current facility-administered medications for this visit.    SURGICAL HISTORY:  Past Surgical History:  Procedure Laterality Date   CARDIAC CATHETERIZATION N/A 12/14/2014   Procedure: Right/Left Heart Cath and Coronary Angiography;  Surgeon: Ozell Fell, MD;  Location: [facility] INVASIVE CV LAB;  Service: Cardiovascular;  Laterality: N/A;   CARDIAC ELECTROPHYSIOLOGY STUDY AND ABLATION     around 2006, Patient states that heart rate was up to 180 bpm.   CESAREAN SECTION     x2 1990, 2005   CYSTOSCOPY  08/17/2021   Procedure: CYSTOSCOPY;  Surgeon: Sarrah Browning, MD;  Location: [facility];  Service: Gynecology;;    ENDOMETRIAL ABLATION     HYSTEROSCOPY WITH D & C N/A 03/11/2021   Procedure: DILATATION AND CURETTAGE /HYSTEROSCOPY;  Surgeon: Gretta Gums, MD;  Location: [facility] OR;  Service: Gynecology;  Laterality: N/A;   OPERATIVE ULTRASOUND N/A 03/11/2021   Procedure: OPERATIVE ULTRASOUND;  Surgeon: Gretta Gums, MD;  Location: [facility] OR;  Service: Gynecology;  Laterality: N/A;  ultrasound guidance needed   ROBOT ASSISTED LAPAROSCOPIC NEPHRECTOMY Right 12/25/2022   Procedure: XI ROBOTIC ASSISTED LAPAROSCOPIC NEPHRECTOMY;  Surgeon: Sherrilee Belvie CROME, MD;  Location: [facility] ORS;  Service: Urology;  Laterality: Right;   ROBOTIC ASSISTED LAPAROSCOPIC HYSTERECTOMY AND SALPINGECTOMY Bilateral 08/17/2021   Procedure: XI ROBOTIC ASSISTED LAPAROSCOPIC HYSTERECTOMY AND  BILATERAL SALPINGECTOMY AND OOPHERETOMY;  Surgeon: Sarrah Browning, MD;  Location: [facility] [place];  Service: Gynecology;  Laterality: Bilateral;    REVIEW OF SYSTEMS:  A comprehensive review of systems was negative except for: Constitutional: positive for fatigue   PHYSICAL EXAMINATION: General appearance: alert, cooperative, fatigued, and no distress Head: Normocephalic, without obvious abnormality, atraumatic Neck: no adenopathy, no JVD, supple, symmetrical, trachea midline, and thyroid  not enlarged, symmetric, no tenderness/mass/nodules Lymph nodes: Cervical, supraclavicular, and axillary nodes normal. Resp: clear to auscultation bilaterally Back: symmetric, no curvature. ROM normal. No CVA tenderness. Cardio: regular rate and rhythm, S1, S2 normal, no murmur, click, rub or gallop GI: soft, non-tender; bowel sounds normal; no masses,  no organomegaly Extremities: extremities normal, atraumatic, no cyanosis or edema  ECOG PERFORMANCE STATUS: 1 - Symptomatic but completely ambulatory  Blood pressure 135/82, pulse 82, temperature 98 F (36.7 C), temperature source Temporal, resp. rate 16, height 5' 6 (1.676 m), weight 266 lb (120.7 kg),  SpO2 100%.  LABORATORY DATA: Lab Results  Component Value Date   WBC 7.8 01/01/2024   HGB 13.5 01/01/2024   HCT 39.1 01/01/2024   MCV 85.2 01/01/2024   PLT 190 01/01/2024      Chemistry      Component Value Date/Time   NA 137 12/11/2023 0941   NA 137 06/08/2023  1049   NA 140 10/19/2011 0919   K 3.7 12/11/2023 0941   K 4.1 10/19/2011 0919   CL 102 12/11/2023 0941   CL 104 10/19/2011 0919   CO2 26 12/11/2023 0941   CO2 24 10/19/2011 0919   BUN 25 (H) 12/11/2023 0941   BUN 21 06/08/2023 1049   BUN 16 10/19/2011 0919   CREATININE 0.92 12/11/2023 0941   CREATININE 0.63 10/19/2011 0919   GLU 297 04/15/2019 0000      Component Value Date/Time   CALCIUM  9.7 12/11/2023 0941   CALCIUM  8.5 10/19/2011 0919   ALKPHOS 47 12/11/2023 0941   ALKPHOS 57 10/19/2011 0919   AST 17 12/11/2023 0941   ALT 21 12/11/2023 0941   ALT 24 10/19/2011 0919   BILITOT 0.5 12/11/2023 0941       RADIOGRAPHIC STUDIES: NCV with EMG (electromyography) Result Date: 12/14/2023 Eldonna Novel, MD     12/23/2023 10:46 AM EMG & NCV Findings: Evaluation of the right median motor nerve showed prolonged distal onset latency (7.6 ms), reduced amplitude (3.0 mV), and decreased conduction velocity (Elbow-Wrist, 41 m/s).  The right median (across palm) sensory nerve showed prolonged distal peak latency (Wrist, 9.1 ms), reduced amplitude (8.4 V), and prolonged distal peak latency (Palm, 7.2 ms).  The right ulnar sensory nerve showed reduced amplitude (11.6 V).  All remaining nerves (as indicated in the following tables) were within normal limits.  All examined muscles (as indicated in the following table) showed no evidence of electrical instability.  Impression: The above electrodiagnostic study is ABNORMAL and reveals evidence of a severe right median nerve entrapment at the wrist (carpal tunnel syndrome) affecting sensory and motor components. There is no significant electrodiagnostic evidence of any other focal  nerve entrapment, brachial plexopathy or cervical radiculopathy. Recommendations: 1.  Follow-up with referring physician. 2.  Continue current management of symptoms. 3.  Suggest surgical evaluation. ___________________________ Prentice Eldonna FAAPMR Board Certified, American Board of Physical Medicine and Rehabilitation Nerve Conduction Studies Anti Sensory Summary Table  Stim Site NR Peak (ms) Norm Peak (ms) P-T Amp (V) Norm P-T Amp Site1 Site2 Delta-P (ms) Dist (cm) Vel (m/s) Norm Vel (m/s) Right Median Acr Palm Anti Sensory (2nd Digit)  30.1C Wrist    *9.1 <3.6 *8.4 >10 Wrist Palm 1.9 0.0   Palm    *7.2 <2.0 1.9        Right Radial Anti Sensory (Base 1st Digit)  30.5C Wrist    2.3 <3.1 17.9  Wrist Base 1st Digit 2.3 0.0   Right Ulnar Anti Sensory (5th Digit)  30.8C Wrist    3.6 <3.7 *11.6 >15.0 Wrist 5th Digit 3.6 14.0 39 >38 Motor Summary Table  Stim Site NR Onset (ms) Norm Onset (ms) O-P Amp (mV) Norm O-P Amp Site1 Site2 Delta-0 (ms) Dist (cm) Vel (m/s) Norm Vel (m/s) Right Median Motor (Abd Poll Brev)  30.9C Wrist    *7.6 <4.2 *3.0 >5 Elbow Wrist 4.7 19.5 *41 >50 Elbow    12.3  1.7        Right Ulnar Motor (Abd Dig Min)  30.5C Wrist    3.6 <4.2 6.8 >3 B Elbow Wrist 3.7 20.0 54 >53 B Elbow    7.3  5.4  A Elbow B Elbow 1.8 11.0 61 >53 A Elbow    9.1  6.0        EMG  Side Muscle Nerve Root Ins Act Fibs Psw Amp Dur Poly Recrt Int Bruna Comment Right Abd Poll Brev Median C8-T1  Nml Nml Nml Nml Nml 0 Nml Nml  Right 1stDorInt Ulnar C8-T1 Nml Nml Nml Nml Nml 0 Nml Nml  Right PronatorTeres Median C6-7 Nml Nml Nml Nml Nml 0 Nml Nml  Right Biceps Musculocut C5-6 Nml Nml Nml Nml Nml 0 Nml Nml  Right Deltoid Axillary C5-6 Nml Nml Nml Nml Nml 0 Nml Nml  Nerve Conduction Studies Anti Sensory Left/Right Comparison  Stim Site L Lat (ms) R Lat (ms) L-R Lat (ms) L Amp (V) R Amp (V) L-R Amp (%) Site1 Site2 L Vel (m/s) R Vel (m/s) L-R Vel (m/s) Median Acr Palm Anti Sensory (2nd Digit)  30.1C Wrist  *9.1   *8.4  Wrist Palm     Palm  *7.2   1.9       Radial Anti Sensory (Base 1st Digit)  30.5C Wrist  2.3   17.9  Wrist Base 1st Digit    Ulnar Anti Sensory (5th Digit)  30.8C Wrist  3.6   *11.6  Wrist 5th Digit  39  Motor Left/Right Comparison  Stim Site L Lat (ms) R Lat (ms) L-R Lat (ms) L Amp (mV) R Amp (mV) L-R Amp (%) Site1 Site2 L Vel (m/s) R Vel (m/s) L-R Vel (m/s) Median Motor (Abd Poll Brev)  30.9C Wrist  *7.6   *3.0  Elbow Wrist  *41  Elbow  12.3   1.7       Ulnar Motor (Abd Dig Min)  30.5C Wrist  3.6   6.8  B Elbow Wrist  54  B Elbow  7.3   5.4  A Elbow B Elbow  61  A Elbow  9.1   6.0       Waveforms:          ASSESSMENT AND PLAN: This is a very pleasant 58 years old white female recently diagnosed with a stage III (T3a, N0, M0) clear-cell renal cell carcinoma of the right kidney diagnosed in July 2024 status post robotic assisted right radical nephrectomy and the final pathology was consistent with clear-cell renal cell carcinoma with focal rhabdoid features, nuclear grade 4 with tumor size of 10.0 cm and tumor extends into the renal sinus fat and renal vein (pT3a).  The ureteral, vascular and all margins of resection were negative for tumor.  She is currently undergoing adjuvant treatment with immunotherapy with Keytruda  200 Mg IV every 3 weeks.  She started the first cycle of her treatment on 02/13/2023.  Status post 14 cycles.   The patient has been tolerating this treatment well except for the fatigue. Assessment and Plan Assessment & Plan Malignant neoplasm under active immunotherapy Currently undergoing immunotherapy, cycle number fifteen, with two more cycles remaining. A full scan of the chest, abdomen, and pelvis is planned post-treatment to assess for changes or progression. Chest x-ray is not reliable for assessing malignancy progression. - Administer cycle fifteen of immunotherapy - Perform full scan of chest, abdomen, and pelvis after completion of immunotherapy treatment  Hypothyroidism secondary to  immunotherapy Hypothyroidism likely secondary to immunotherapy. Started on levothyroxine  25 micrograms with noted improvement in symptoms, including cessation of night sweats. Monitoring thyroid  function tests to assess response. - Continue levothyroxine  25 micrograms daily - Monitor thyroid  function tests  Fatigue and weakness of legs Reports significant fatigue and weakness in legs. Blood work shows no anemia, which appears normal. Fatigue and weakness may be related to ongoing treatment and hypothyroidism. The patient was advised to call immediately if she has any concerning symptoms in the interval. The patient  voices understanding of current disease status and treatment options and is in agreement with the current care plan.  All questions were answered. The patient knows to call the clinic with any problems, questions or concerns. We can certainly see the patient much sooner if necessary. The total time spent in the appointment was 20 minutes.  Disclaimer: This note was dictated with voice recognition software. Similar sounding words can inadvertently be transcribed and may not be corrected upon review.

## 2024-01-02 LAB — T4: T4, Total: 6.6 ug/dL (ref 4.5–12.0)

## 2024-01-05 ENCOUNTER — Encounter: Payer: Self-pay | Admitting: Urology

## 2024-01-13 DIAGNOSIS — R030 Elevated blood-pressure reading, without diagnosis of hypertension: Secondary | ICD-10-CM | POA: Diagnosis not present

## 2024-01-13 DIAGNOSIS — M79641 Pain in right hand: Secondary | ICD-10-CM | POA: Diagnosis not present

## 2024-01-15 ENCOUNTER — Ambulatory Visit: Payer: Self-pay | Admitting: Urology

## 2024-01-17 ENCOUNTER — Encounter: Admitting: Orthopedic Surgery

## 2024-01-18 NOTE — Progress Notes (Signed)
 [facility] OFFICE PROGRESS NOTE  Dow Longs, PA-C [address] [place] [address]  DIAGNOSIS: Stage III (T3a, N0, M0) clear-cell renal cell carcinoma of the right kidney diagnosed in July 2024   PRIOR THERAPY: Status post robotic assisted right radical nephrectomy and the final pathology was consistent with clear-cell renal cell carcinoma with focal rhabdoid features, nuclear grade 4 with tumor size of 10.0 cm and tumor extends into the renal sinus fat and renal vein (pT3a). The ureteral, vascular and all margins of resection were negative for tumor.   CURRENT THERAPY: Adjuvant treatment with immunotherapy with Keytruda  200 Mg IV every 3 weeks.  First dose was given on 02/13/2023.  Status post 15 cycles.   INTERVAL HISTORY: Christina Cameron 58 y.o. female returns to the clinic today for a follow-up visit.  The patient was last seen in the clinic by Dr. Sherrod on 01/01/2024.  She is being seen for renal cell carcinoma.  She is currently on immunotherapy.  She is scheduled for 2 more immunotherapy treatments.  She saw urology.  She had a prior chest x-ray see something about a 5 mm lung nodule.  She had a follow-up chest x-ray on 12/28/2023 which did not show any acute cardiopulmonary process.  She also sees orthopedic for intermittent right hand swelling.  They believe that this is secondary to nerve damage. After she gets her immunotherapy, they are going to consider her for surgery.   The patient is also been having some ongoing struggles related to an abdominal hernia for which she is seen [facility] surgery.  They commended referral to [facility] for surgical intervention due to the size and complexity of the hernia but she was told that she need to lose weight prior to being considered for surgery.It looks like she is scheduled for CT CAP in December and has a follow up in January with her urologist.     She experiences ongoing issues with a hernia, which significantly affects  her quality of life. The hernia occasionally protrudes, forming a 'cone head' shape, but is not currently causing new pain or skin changes. She experiences gas and acid buildup, which she believes contributes to her diarrhea. She has had diarrhea for three days, with bowel movements occurring four to five times a day. She just purchased imodium  to see if this would help.   She has intermittent nausea at baseline which she takes Compazine  and Zofran .  She denies any vomiting. She denies any fever, chills, or signs of infection.  She gets night sweats periodically but they have not been bad recently.  She reports similar dyspnea on exertion secondary to the hernia which is stable.  Denies any cough, chest pain, or hemoptysis.  She denies any rashes or skin changes.  She denies any headaches. She has had some visual changes and is seeing an eye doctor and has a follow up in a few months. They believe this to be from diabetes. She lost weight recently.  She is here today for evaluation repeat blood work before undergoing cycle #16.    MEDICAL HISTORY: Past Medical History:  Diagnosis Date   Arm vein blood clot, unspecified laterality    around 20 years ago as of 08/10/21, Patient doesn't remember what caused it. She was not put on blood thinners.   Arthritis    right knee   Asthma    Cancer (HCC)    cervical 1989   Cancer of kidney (HCC)  Chest pain    a. normal cors by cath in 11/2014 / 03/02/2020 nuclear stress test demonstrated no perfusion defects consistent with prior infart or current ischemia. Normal study.   Chronic diastolic (congestive) heart failure (HCC)    09/27/20 Echocardiogram in Epic showed LVEF 60 to 65%.   Complication of anesthesia    post-operative nausea & vomiting   COVID-19    05/29/19 & 05/2020 Covid infections   Depression    Diabetes mellitus, type 2 (HCC)    Dysrhythmia    heart palpitations   GERD (gastroesophageal reflux disease)    H/O cardiovascular stress test  03/02/2020   03/02/2020 Nuclear stress test in Epic showed no perusion defects consistent with prior infarct or ischemia - normal study.   Headache    migraines   High cholesterol    pt takes Crestor    History of cardiac radiofrequency ablation    Hypertension    Last cardiology office visit, 10/22/20 with Prentice Medley, NP ( see note in Epic) as of 08/09/21.   Morbid (severe) obesity due to excess calories (HCC)    Neuromuscular disorder (HCC)    severe neuropathy in feet   Osteoarthritis of knee, unspecified    Palpitations    PONV (postoperative nausea and vomiting)    Post-COVID syndrome 05/2019   Pt experienced chest tightness, sob, palpitations & dizziness after Covid infection. See 04/2020 note from Prentice Medley, NP in [place].   PTSD (post-traumatic stress disorder)    Raynaud's syndrome    Sleep apnea 09/24/2020   sleep study indicated moderate sleep apea   SVT (supraventricular tachycardia) (HCC)    a. s/p ablation by Dr. Waddell in 2006.   Wears glasses    prescripton reading glasses    ALLERGIES:  has no known allergies.  MEDICATIONS:  Current Outpatient Medications  Medication Sig Dispense Refill   methylPREDNISolone  (MEDROL  DOSEPAK) 4 MG TBPK tablet Take per packet instructions (Patient not taking: Reported on 12/11/2023) 21 each 0   albuterol  (VENTOLIN  HFA) 108 (90 Base) MCG/ACT inhaler Inhale 1-2 puffs into the lungs every 6 (six) hours as needed for wheezing or shortness of breath. 18 g 0   brexpiprazole  (REXULTI ) 2 MG TABS tablet Take 2 mg by mouth daily.     buPROPion  (WELLBUTRIN  XL) 150 MG 24 hr tablet Take 150 mg by mouth every morning.     busPIRone  (BUSPAR ) 10 MG tablet Take 10 mg by mouth 2 (two) times daily.     cephALEXin  (KEFLEX ) 500 MG capsule Take 1 capsule (500 mg total) by mouth 3 (three) times daily. 21 capsule 0   cetirizine  (ZYRTEC ) 10 MG tablet Take 10 mg by mouth daily as needed for allergies.     colchicine  0.6 MG tablet Take 2 tabs immediately, then  1 tab twice per day for the duration of the flare up to a max of 7 days (but discontinue for stomach pains or diarrhea) 14 tablet 0   cyclobenzaprine  (FLEXERIL ) 5 MG tablet Take 1 tablet (5 mg total) by mouth 3 (three) times daily as needed for muscle spasms. Do not drive or operate a heavy machinery after taking this medicine. 20 tablet 0   ezetimibe  (ZETIA ) 10 MG tablet Take 1 tablet (10 mg total) by mouth daily. 90 tablet 3   furosemide  (LASIX ) 20 MG tablet Take 1 tablet (20 mg total) by mouth daily. (Patient taking differently: Take 20 mg by mouth daily as needed for fluid or edema.) 90 tablet 3   gabapentin  (  NEURONTIN ) 300 MG capsule Take 1 capsule (300 mg total) by mouth 3 (three) times daily. Start at bedtime and slowly increase to 3x a day as needed to make sure no side effects like sedation. (Patient taking differently: Take 300 mg by mouth at bedtime. Start at bedtime and slowly increase to 3x a day as needed to make sure no side effects like sedation.) 90 capsule 11   hydrOXYzine  (ATARAX ) 50 MG tablet Take 100 mg by mouth at bedtime.     lamoTRIgine  (LAMICTAL ) 200 MG tablet Take 200 mg by mouth at bedtime.     levothyroxine  (SYNTHROID ) 25 MCG tablet Take 1 tablet (25 mcg total) by mouth daily before breakfast. 30 tablet 1   LORazepam  (ATIVAN ) 0.5 MG tablet TAKE 1 TABLET BY MOUTH ONCE DAILY FOR ANXIETY / PANIC ATTACKS.     metFORMIN  (GLUCOPHAGE ) 500 MG tablet Take 1,000 mg by mouth 2 (two) times daily with a meal.     metoprolol  tartrate (LOPRESSOR ) 50 MG tablet Take 2 tablets (100 mg total) by mouth 2 (two) times daily. 180 tablet 3   MOUNJARO 10 MG/0.5ML Pen Inject 10 mg into the skin once a week.     mupirocin  ointment (BACTROBAN ) 2 % Apply 1 Application topically 2 (two) times daily. 30 g 2   nitroGLYCERIN  (NITROSTAT ) 0.4 MG SL tablet DISSOLVE ONE TABLET UNDER THE TONGUE EVERY 5 MINUTES AS NEEDED FOR CHEST PAIN.  DO NOT EXCEED A TOTAL OF 3 DOSES IN 15 MINUTES 75 tablet 0   nystatin  (MYCOSTATIN/NYSTOP) powder SMARTSIG:1 Application Topical 2-3 Times Daily     prochlorperazine  (COMPAZINE ) 10 MG tablet Take 1 tablet (10 mg total) by mouth every 6 (six) hours as needed for nausea or vomiting. 30 tablet 1   rosuvastatin  (CRESTOR ) 40 MG tablet Take 1 tablet (40 mg total) by mouth daily. 90 tablet 3   sertraline  (ZOLOFT ) 100 MG tablet Take 200 mg by mouth daily.     tizanidine  (ZANAFLEX ) 2 MG capsule Take 1 capsule (2 mg total) by mouth 3 (three) times daily. 15 capsule 0   topiramate (TOPAMAX) 25 MG tablet Take 25 mg by mouth at bedtime.     No current facility-administered medications for this visit.    SURGICAL HISTORY:  Past Surgical History:  Procedure Laterality Date   CARDIAC CATHETERIZATION N/A 12/14/2014   Procedure: Right/Left Heart Cath and Coronary Angiography;  Surgeon: Ozell Fell, MD;  Location: [facility] INVASIVE CV LAB;  Service: Cardiovascular;  Laterality: N/A;   CARDIAC ELECTROPHYSIOLOGY STUDY AND ABLATION     around 2006, Patient states that heart rate was up to 180 bpm.   CESAREAN SECTION     x2 1990, 2005   CYSTOSCOPY  08/17/2021   Procedure: CYSTOSCOPY;  Surgeon: Sarrah Browning, MD;  Location: [facility];  Service: Gynecology;;   ENDOMETRIAL ABLATION     HYSTEROSCOPY WITH D & C N/A 03/11/2021   Procedure: DILATATION AND CURETTAGE /HYSTEROSCOPY;  Surgeon: Gretta Gums, MD;  Location: [facility] OR;  Service: Gynecology;  Laterality: N/A;   OPERATIVE ULTRASOUND N/A 03/11/2021   Procedure: OPERATIVE ULTRASOUND;  Surgeon: Gretta Gums, MD;  Location: [facility] OR;  Service: Gynecology;  Laterality: N/A;  ultrasound guidance needed   ROBOT ASSISTED LAPAROSCOPIC NEPHRECTOMY Right 12/25/2022   Procedure: XI ROBOTIC ASSISTED LAPAROSCOPIC NEPHRECTOMY;  Surgeon: Sherrilee Belvie CROME, MD;  Location: [facility];  Service: Urology;  Laterality: Right;   ROBOTIC ASSISTED LAPAROSCOPIC HYSTERECTOMY AND SALPINGECTOMY Bilateral 08/17/2021   Procedure: XI ROBOTIC  ASSISTED  LAPAROSCOPIC HYSTERECTOMY AND  BILATERAL SALPINGECTOMY AND OOPHERETOMY;  Surgeon: Sarrah Browning, MD;  Location: [facility];  Service: Gynecology;  Laterality: Bilateral;    REVIEW OF SYSTEMS:   Constitutional: Positive for fatigue. Negative for appetite change, chills, fever and unexpected weight change.  HENT: Negative for mouth sores, nosebleeds, sore throat and trouble swallowing.   Eyes: Negative for eye problems and icterus.  Respiratory: Positive for baseline dyspnea. Negative for cough, hemoptysis,  and wheezing.   Cardiovascular: Negative for chest pain and leg swelling. Positive for right hand swelling.  Gastrointestinal: Positive for intermittent nausea. Positive for diarrhea. Denies changes with her baseline discomfort from her hernia. Negative for vomiting.  Positive for large hernia.  Genitourinary: Negative for bladder incontinence, difficulty urinating, dysuria, frequency and hematuria.   Musculoskeletal: Positive for chronic intermittent low back pain and intermittent swelling in her right hand. Negative for  gait problem, neck pain and neck stiffness.  Skin: Negative for itching and rash.  Neurological: Negative for dizziness, extremity weakness, gait problem, light-headedness and seizures.  Hematological: Negative for adenopathy. Does not bruise/bleed easily.  Psychiatric/Behavioral: Negative for confusion, depression and sleep disturbance. The patient is not nervous/anxious.       PHYSICAL EXAMINATION:  There were no vitals taken for this visit.  ECOG PERFORMANCE STATUS: 1  Physical Exam  Constitutional: Oriented to person, place, and time and well-developed, well-nourished, and in no distress. HENT:  Head: Normocephalic and atraumatic.  Mouth/Throat: Oropharynx is clear and moist. No oropharyngeal exudate.  Eyes: Conjunctivae are normal. Right eye exhibits no discharge. Left eye exhibits no discharge. No scleral icterus.  Neck: Normal  range of motion. Neck supple.  Cardiovascular: Normal rate, regular rhythm, normal heart sounds and intact distal pulses.   Pulmonary/Chest: Effort normal and breath sounds normal. No respiratory distress. No wheezes. No rales.  Abdominal: Soft. Bowel sounds are normal. Exhibits no distension and no mass. There is no tenderness to palpation over the hernia. Positive for abdominal hernia.  Musculoskeletal: Normal range of motion. Exhibits no edema.  Lymphadenopathy:    No cervical adenopathy.  Neurological: Alert and oriented to person, place, and time. Exhibits normal muscle tone. Gait normal. Coordination normal.  Skin: Skin is warm and dry. No rash noted. Not diaphoretic. No erythema. No pallor.  Psychiatric: Mood, memory and judgment normal.  Vitals reviewed.  LABORATORY DATA: Lab Results  Component Value Date   WBC 7.8 01/01/2024   HGB 13.5 01/01/2024   HCT 39.1 01/01/2024   MCV 85.2 01/01/2024   PLT 190 01/01/2024      Chemistry      Component Value Date/Time   NA 136 01/01/2024 1018   NA 137 06/08/2023 1049   NA 140 10/19/2011 0919   K 4.1 01/01/2024 1018   K 4.1 10/19/2011 0919   CL 100 01/01/2024 1018   CL 104 10/19/2011 0919   CO2 27 01/01/2024 1018   CO2 24 10/19/2011 0919   BUN 18 01/01/2024 1018   BUN 21 06/08/2023 1049   BUN 16 10/19/2011 0919   CREATININE 0.94 01/01/2024 1018   CREATININE 0.63 10/19/2011 0919   GLU 297 04/15/2019 0000      Component Value Date/Time   CALCIUM  8.8 (L) 01/01/2024 1018   CALCIUM  8.5 10/19/2011 0919   ALKPHOS 46 01/01/2024 1018   ALKPHOS 57 10/19/2011 0919   AST 14 (L) 01/01/2024 1018   ALT 16 01/01/2024 1018   ALT 24 10/19/2011 0919   BILITOT 0.5 01/01/2024 1018  RADIOGRAPHIC STUDIES:  Chest 1 View Result Date: 01/05/2024 CLINICAL DATA:  Renal cell carcinoma of right kidney. EXAM: CHEST  1 VIEW COMPARISON:  Radiograph 06/13/2023 FINDINGS: The cardiomediastinal contours are normal. The lungs are clear. No visible  pulmonary nodule. Pulmonary vasculature is normal. No consolidation, pleural effusion, or pneumothorax. No acute osseous abnormalities are seen. IMPRESSION: Negative radiograph of the chest. Electronically Signed   By: Andrea Gasman M.D.   On: 01/05/2024 17:10     ASSESSMENT/PLAN:  This is a very pleasant 58 year old Caucasian female diagnosed with stage III (T3a, N0, M0) clear-cell renal cell carcinoma\of the right kidney.  The patient was diagnosed in July 2024.  She is status post robot-assisted right radical nephrectomy.  The final pathology showed clear-cell renal cell carcinoma with focal rhabdoid features, nuclear grade 4, with a tumor size of 10 cm and the tumor extended into the renal sinus fat and renal vein (P T3a).  The margins were negative for tumor.   The patient is currently on adjuvant immunotherapy with Keytruda  200 mg IV every 3 weeks. She is status post 15 cycles of treatment and has been tolerating it fair.     Labs were reviewed.  Recommend that she proceed with cycle #16 today as scheduled.    We will see her back for follow-up visit in 3 weeks for evaluation repeat blood work before undergoing her next cycle of treatment.   She will see surgery at [facility] to see if she is a candidate for hernia repair surgery. No incarceration or strangulation. Not prioritized over cancer treatment. - Monitor hernia symptoms conservatively until cancer treatment completion. - Plan surgical evaluation post-cancer treatment.  - Monitor weight and nutritional status. - Ensure adequate hydration and nutrition. - Evaluate kidney function for dehydration assessment.   - Attend scheduled eye appointment in three to four months.  She will continue to follow with her hand surgeon after she completes immunotherapy.   She reports some recent diarrhea. She will try to use imodium . If no improvement or worsening symptoms then she was advised to call us . Ensure adequate hydration  The patient  was advised to call immediately if she has any concerning symptoms in the interval. The patient voices understanding of current disease status and treatment options and is in agreement with the current care plan. All questions were answered. The patient knows to call the clinic with any problems, questions or concerns. We can certainly see the patient much sooner if necessary            No orders of the defined types were placed in this encounter.    The total time spent in the appointment was 20-29 minutes  Taivon Haroon L Zariyah Stephens, PA-C 01/18/24

## 2024-01-22 ENCOUNTER — Inpatient Hospital Stay

## 2024-01-22 ENCOUNTER — Inpatient Hospital Stay (HOSPITAL_BASED_OUTPATIENT_CLINIC_OR_DEPARTMENT_OTHER): Admitting: Physician Assistant

## 2024-01-22 VITALS — BP 129/83 | HR 71 | Temp 97.2°F | Resp 14 | Ht 66.0 in | Wt 258.0 lb

## 2024-01-22 DIAGNOSIS — Z905 Acquired absence of kidney: Secondary | ICD-10-CM | POA: Diagnosis not present

## 2024-01-22 DIAGNOSIS — E032 Hypothyroidism due to medicaments and other exogenous substances: Secondary | ICD-10-CM | POA: Diagnosis not present

## 2024-01-22 DIAGNOSIS — Z5112 Encounter for antineoplastic immunotherapy: Secondary | ICD-10-CM | POA: Diagnosis not present

## 2024-01-22 DIAGNOSIS — C641 Malignant neoplasm of right kidney, except renal pelvis: Secondary | ICD-10-CM

## 2024-01-22 DIAGNOSIS — Z79899 Other long term (current) drug therapy: Secondary | ICD-10-CM | POA: Diagnosis not present

## 2024-01-22 LAB — CBC WITH DIFFERENTIAL (CANCER CENTER ONLY)
Abs Immature Granulocytes: 0.06 K/uL (ref 0.00–0.07)
Basophils Absolute: 0.1 K/uL (ref 0.0–0.1)
Basophils Relative: 1 %
Eosinophils Absolute: 0.2 K/uL (ref 0.0–0.5)
Eosinophils Relative: 3 %
HCT: 43.4 % (ref 36.0–46.0)
Hemoglobin: 14.8 g/dL (ref 12.0–15.0)
Immature Granulocytes: 1 %
Lymphocytes Relative: 21 %
Lymphs Abs: 2 K/uL (ref 0.7–4.0)
MCH: 29.4 pg (ref 26.0–34.0)
MCHC: 34.1 g/dL (ref 30.0–36.0)
MCV: 86.3 fL (ref 80.0–100.0)
Monocytes Absolute: 0.9 K/uL (ref 0.1–1.0)
Monocytes Relative: 9 %
Neutro Abs: 6.4 K/uL (ref 1.7–7.7)
Neutrophils Relative %: 65 %
Platelet Count: 221 K/uL (ref 150–400)
RBC: 5.03 MIL/uL (ref 3.87–5.11)
RDW: 13.3 % (ref 11.5–15.5)
WBC Count: 9.6 K/uL (ref 4.0–10.5)
nRBC: 0 % (ref 0.0–0.2)

## 2024-01-22 LAB — CMP (CANCER CENTER ONLY)
ALT: 28 U/L (ref 0–44)
AST: 31 U/L (ref 15–41)
Albumin: 4.5 g/dL (ref 3.5–5.0)
Alkaline Phosphatase: 48 U/L (ref 38–126)
Anion gap: 8 (ref 5–15)
BUN: 14 mg/dL (ref 6–20)
CO2: 23 mmol/L (ref 22–32)
Calcium: 9.8 mg/dL (ref 8.9–10.3)
Chloride: 104 mmol/L (ref 98–111)
Creatinine: 0.99 mg/dL (ref 0.44–1.00)
GFR, Estimated: >60 mL/min (ref >60)
Glucose, Bld: 193 mg/dL — ABNORMAL HIGH (ref 70–99)
Potassium: 4.1 mmol/L (ref 3.5–5.1)
Sodium: 135 mmol/L (ref 135–145)
Total Bilirubin: 0.7 mg/dL (ref 0.0–1.2)
Total Protein: 7.4 g/dL (ref 6.5–8.1)

## 2024-01-22 LAB — TSH: TSH: 5.6 u[IU]/mL — ABNORMAL HIGH (ref 0.350–4.500)

## 2024-01-22 MED ORDER — SODIUM CHLORIDE 0.9 % IV SOLN: Freq: Once | INTRAVENOUS | Status: AC

## 2024-01-22 MED ORDER — SODIUM CHLORIDE 0.9 % IV SOLN
200.0000 mg | Freq: Once | INTRAVENOUS | Status: AC
  Administered 2024-01-22: 200 mg via INTRAVENOUS
  Filled 2024-01-22: qty 200

## 2024-01-22 NOTE — Patient Instructions (Signed)

## 2024-01-23 LAB — T4: T4, Total: 7.2 ug/dL (ref 4.5–12.0)

## 2024-01-31 ENCOUNTER — Ambulatory Visit (INDEPENDENT_AMBULATORY_CARE_PROVIDER_SITE_OTHER): Admitting: Orthopedic Surgery

## 2024-01-31 DIAGNOSIS — G5601 Carpal tunnel syndrome, right upper limb: Secondary | ICD-10-CM | POA: Diagnosis not present

## 2024-01-31 NOTE — Progress Notes (Signed)
 Christina Cameron - 58 y.o. female MRN [id number]  Date of birth: 10/02/1965  Office Visit Note: Visit Date: 01/31/2024 PCP: Dow Longs, PA-C Referred by: Emiliano Leonce CROME, PA*  Subjective: No chief complaint on file.  HPI: Christina Cameron is a pleasant 58 y.o. female who presents today for evaluation of ongoing numbness and tingling in the radial aspect of the right hand which is present now for multiple months, worsening in nature.  Of note, she does have history of kidney cancer status post nephrectomy, and is currently undergoing immunotherapy with 1 treatment remaining.  States that she has trialed activity modification, bracing and prior steroid tapers with minimal and not lasting relief.  She is also having ongoing stiffness in the right hand to multiple digits, worse in the morning.  She has a recent EMG of the right upper extremity which shows severe carpal tunnel syndrome.  She is here today for specific hand surgical evaluation.  Pertinent ROS were reviewed with the patient and found to be negative unless otherwise specified above in HPI.   Visit Reason:right carpal tunnel Duration of symptoms: 2 months Hand dominance: right Occupation: Diabetic: Yes Smoking: No Heart/Lung History: CHF, HTN, sleep apnea Blood Thinners: none  Prior Testing/EMG:EMG on 12/14/23 Injections (Date): none Treatments: bracing, steroids Prior Surgery: none  Hx of kidney cancer status post nephrectomy, currently on immunotherapy  Assessment & Plan: Visit Diagnoses:  1. Carpal tunnel syndrome, right upper limb     Plan: Extensive discussion was had with the patient today about her ongoing right sided carpal tunnel syndrome that is refractory to conservative care.  Patient has both clinical and electrodiagnostic evidence to confirm this diagnosis.  At this juncture, she is indicated for right open versus endoscopic carpal tunnel release.  Risks and benefits of both operations were discussed in  detail today.  Understanding all risks and benefits, patient would like to have surgery done in the form of right endoscopic carpal tunnel release.  Risks include but not limited to infection, bleeding, scarring, stiffness, nerve injury or vascular, tendon injury, risk of recurrence and need for subsequent operation were all discussed in detail.  Patient consented understanding the above.  Will move forward surgical scheduling.  I did explain that we would like her to be completed with her immunotherapy going into surgery to prevent infection risk or complication.  Will reach out to her oncology team in order to confirm appropriate timing for surgical scheduling.   Follow-up: No follow-ups on file.   Meds & Orders: No orders of the defined types were placed in this encounter.  No orders of the defined types were placed in this encounter.    Procedures: No procedures performed      Clinical History: No specialty comments available.  She reports that she has never smoked. She has been exposed to tobacco smoke. She has never used smokeless tobacco. No results for input(s): HGBA1C, LABURIC in the last 8760 hours.  Objective:   Vital Signs: There were no vitals taken for this visit.  Physical Exam  Gen: Well-appearing, in no acute distress; non-toxic CV: Regular Rate. Well-perfused. Warm.  Resp: Breathing unlabored on room air; no wheezing. Psych: Fluid speech in conversation; appropriate affect; normal thought process  Ortho Exam PHYSICAL EXAM:  General: Patient is well appearing and in no distress.   Skin and Muscle: No significant skin changes are apparent to upper extremities.   Range of Motion and Palpation Tests: Mobility is full about the elbows  with flexion and extension. Forearm supination and pronation are 85/85 bilaterally.  Wrist flexion/extension is 75/65 left side, right 55/45.  Digital flexion and extension are limited in the right hand actively, slightly  improved passively.    Neurologic, Vascular, Motor: Sensation is diminished to light touch in the right median nerve distribution.    Thenar atrophy: Negative Tinel sign: Positive right carpal tunnel Carpal tunnel compression: Positive right Phalen test: Positive right  Sensory right hand 2-point discrimination (thumb, index, middle): Indiscernible  Motor right hand FPL: 5/5 Index FDP: 5/5 APB: 5/5 Fingers pink and well perfused.  Capillary refill is brisk.     Lab Results  Component Value Date   HGBA1C 8.0 (H) 12/15/2022     Imaging: No results found.  Past Medical/Family/Surgical/Social History: Medications & Allergies reviewed per EMR, new medications updated. Patient Active Problem List   Diagnosis Date Noted   Encounter for antineoplastic immunotherapy 04/11/2023   Primary renal cell carcinoma of right kidney (HCC) 01/30/2023   History of right radical nephrectomy 12/28/2022   Right renal mass 12/25/2022   Raynaud's phenomenon 09/06/2022   Sensorineural hearing loss (SNHL) of both ears 09/06/2022   Tinnitus of both ears 09/06/2022   Cardiac chest pain 05/01/2022   Moderate obstructive sleep apnea 01/23/2022   Heart failure with preserved ejection fraction (HCC) 09/19/2021   Abnormal vaginal bleeding in postmenopausal patient 08/17/2021   Paroxysmal SVT (supraventricular tachycardia) (HCC) 09/17/2019   Upper airway cough syndrome vs atypical asthma/ cough variant  09/17/2019   Anxiety 09/15/2019   Essential hypertension, benign 05/07/2019   Chronic diastolic (congestive) heart failure (HCC)    HLD (hyperlipidemia)    Morbid (severe) obesity due to excess calories (HCC)    Osteoarthritis of knee, unspecified    Palpitations    Somnolence    Type 2 diabetes mellitus with hyperglycemia (HCC)    Raynaud's syndrome    Hypertensive disorder 09/21/2017   Diabetes mellitus (HCC) 09/21/2017   DOE (dyspnea on exertion)    Precordial chest pain 09/08/2014   Past  Medical History:  Diagnosis Date   Arm vein blood clot, unspecified laterality    around 20 years ago as of 08/10/21, Patient doesn't remember what caused it. She was not put on blood thinners.   Arthritis    right knee   Asthma    Cancer (HCC)    cervical 1989   Cancer of kidney (HCC)    Chest pain    a. normal cors by cath in 11/2014 / 03/02/2020 nuclear stress test demonstrated no perfusion defects consistent with prior infart or current ischemia. Normal study.   Chronic diastolic (congestive) heart failure (HCC)    09/27/20 Echocardiogram in Epic showed LVEF 60 to 65%.   Complication of anesthesia    post-operative nausea & vomiting   COVID-19    05/29/19 & 05/2020 Covid infections   Depression    Diabetes mellitus, type 2 (HCC)    Dysrhythmia    heart palpitations   GERD (gastroesophageal reflux disease)    H/O cardiovascular stress test 03/02/2020   03/02/2020 Nuclear stress test in Epic showed no perusion defects consistent with prior infarct or ischemia - normal study.   Headache    migraines   High cholesterol    pt takes Crestor    History of cardiac radiofrequency ablation    Hypertension    Last cardiology office visit, 10/22/20 with Prentice Medley, NP ( see note in Epic) as of 08/09/21.   Morbid (severe) obesity due  to excess calories (HCC)    Neuromuscular disorder (HCC)    severe neuropathy in feet   Osteoarthritis of knee, unspecified    Palpitations    PONV (postoperative nausea and vomiting)    Post-COVID syndrome 05/2019   Pt experienced chest tightness, sob, palpitations & dizziness after Covid infection. See 04/2020 note from Prentice Medley, NP in [place].   PTSD (post-traumatic stress disorder)    Raynaud's syndrome    Sleep apnea 09/24/2020   sleep study indicated moderate sleep apea   SVT (supraventricular tachycardia) (HCC)    a. s/p ablation by Dr. Waddell in 2006.   Wears glasses    prescripton reading glasses   Family History  Problem Relation Age of Onset    Cancer Mother    Cancer Father    Parkinson's disease Father    Stroke Brother    Hypertension Brother    Cancer - Colon Brother    Past Surgical History:  Procedure Laterality Date   CARDIAC CATHETERIZATION N/A 12/14/2014   Procedure: Right/Left Heart Cath and Coronary Angiography;  Surgeon: Ozell Fell, MD;  [facility] INVASIVE CV LAB;  Service: Cardiovascular;  Laterality: N/A;   CARDIAC ELECTROPHYSIOLOGY STUDY AND ABLATION     around 2006, Patient states that heart rate was up to 180 bpm.   CESAREAN SECTION     x2 1990, 2005   CYSTOSCOPY  08/17/2021   Procedure: CYSTOSCOPY;  Surgeon: Sarrah Browning, MD;  Location: [facility];  Service: Gynecology;;   ENDOMETRIAL ABLATION     HYSTEROSCOPY WITH D & C N/A 03/11/2021   Procedure: DILATATION AND CURETTAGE /HYSTEROSCOPY;  Surgeon: Gretta Gums, MD;  Location: [facility];  Service: Gynecology;  Laterality: N/A;   OPERATIVE ULTRASOUND N/A 03/11/2021   Procedure: OPERATIVE ULTRASOUND;  Surgeon: Gretta Gums, MD;  Location: [facility];  Service: Gynecology;  Laterality: N/A;  ultrasound guidance needed   ROBOT ASSISTED LAPAROSCOPIC NEPHRECTOMY Right 12/25/2022   Procedure: XI ROBOTIC ASSISTED LAPAROSCOPIC NEPHRECTOMY;  Surgeon: Sherrilee Belvie CROME, MD;  Location: [facility];  Service: Urology;  Laterality: Right;   ROBOTIC ASSISTED LAPAROSCOPIC HYSTERECTOMY AND SALPINGECTOMY Bilateral 08/17/2021   Procedure: XI ROBOTIC ASSISTED LAPAROSCOPIC HYSTERECTOMY AND  BILATERAL SALPINGECTOMY AND OOPHERETOMY;  Surgeon: Sarrah Browning, MD;  Location[facility] [place];  Service: Gynecology;  Laterality: Bilateral;   Social History   Occupational History   Not on file  Tobacco Use   Smoking status: Never    Passive exposure: Past   Smokeless tobacco: Never  Vaping Use   Vaping status: Never Used  Substance and Sexual Activity   Alcohol use: Yes    Comment: seldom   Drug use: No   Sexual activity: Yes    Birth  control/protection: None    Aritha Huckeba Afton [place], M.D.  [facility]

## 2024-02-11 ENCOUNTER — Encounter: Admitting: Orthopedic Surgery

## 2024-02-12 ENCOUNTER — Encounter: Payer: Self-pay | Admitting: Urology

## 2024-02-12 ENCOUNTER — Inpatient Hospital Stay: Attending: Internal Medicine

## 2024-02-12 ENCOUNTER — Telehealth: Payer: Self-pay

## 2024-02-12 ENCOUNTER — Inpatient Hospital Stay

## 2024-02-12 ENCOUNTER — Encounter: Payer: Self-pay | Admitting: Internal Medicine

## 2024-02-12 ENCOUNTER — Inpatient Hospital Stay: Attending: Internal Medicine | Admitting: Internal Medicine

## 2024-02-12 VITALS — BP 138/90 | HR 80 | Temp 97.0°F | Resp 17 | Ht 66.0 in | Wt 259.7 lb

## 2024-02-12 DIAGNOSIS — C641 Malignant neoplasm of right kidney, except renal pelvis: Secondary | ICD-10-CM

## 2024-02-12 DIAGNOSIS — Z5112 Encounter for antineoplastic immunotherapy: Secondary | ICD-10-CM | POA: Insufficient documentation

## 2024-02-12 DIAGNOSIS — Z79899 Other long term (current) drug therapy: Secondary | ICD-10-CM | POA: Insufficient documentation

## 2024-02-12 LAB — CBC WITH DIFFERENTIAL (CANCER CENTER ONLY)
Abs Immature Granulocytes: 0.04 K/uL (ref 0.00–0.07)
Basophils Absolute: 0.1 K/uL (ref 0.0–0.1)
Basophils Relative: 1 %
Eosinophils Absolute: 0.3 K/uL (ref 0.0–0.5)
Eosinophils Relative: 4 %
HCT: 41.1 % (ref 36.0–46.0)
Hemoglobin: 14 g/dL (ref 12.0–15.0)
Immature Granulocytes: 1 %
Lymphocytes Relative: 28 %
Lymphs Abs: 1.9 K/uL (ref 0.7–4.0)
MCH: 29.8 pg (ref 26.0–34.0)
MCHC: 34.1 g/dL (ref 30.0–36.0)
MCV: 87.4 fL (ref 80.0–100.0)
Monocytes Absolute: 0.5 K/uL (ref 0.1–1.0)
Monocytes Relative: 7 %
Neutro Abs: 4 K/uL (ref 1.7–7.7)
Neutrophils Relative %: 59 %
Platelet Count: 222 K/uL (ref 150–400)
RBC: 4.7 MIL/uL (ref 3.87–5.11)
RDW: 13.5 % (ref 11.5–15.5)
WBC Count: 6.8 K/uL (ref 4.0–10.5)
nRBC: 0 % (ref 0.0–0.2)

## 2024-02-12 LAB — CMP (CANCER CENTER ONLY)
ALT: 23 U/L (ref 0–44)
AST: 24 U/L (ref 15–41)
Albumin: 4.6 g/dL (ref 3.5–5.0)
Alkaline Phosphatase: 49 U/L (ref 38–126)
Anion gap: 9 (ref 5–15)
BUN: 14 mg/dL (ref 6–20)
CO2: 22 mmol/L (ref 22–32)
Calcium: 9.2 mg/dL (ref 8.9–10.3)
Chloride: 108 mmol/L (ref 98–111)
Creatinine: 0.96 mg/dL (ref 0.44–1.00)
GFR, Estimated: >60 mL/min (ref >60)
Glucose, Bld: 134 mg/dL — ABNORMAL HIGH (ref 70–99)
Potassium: 3.8 mmol/L (ref 3.5–5.1)
Sodium: 139 mmol/L (ref 135–145)
Total Bilirubin: 0.6 mg/dL (ref 0.0–1.2)
Total Protein: 7.6 g/dL (ref 6.5–8.1)

## 2024-02-12 LAB — TSH: TSH: 2.88 u[IU]/mL (ref 0.350–4.500)

## 2024-02-12 MED ORDER — SODIUM CHLORIDE 0.9 % IV SOLN
200.0000 mg | Freq: Once | INTRAVENOUS | Status: AC
  Administered 2024-02-12: 200 mg via INTRAVENOUS
  Filled 2024-02-12: qty 200

## 2024-02-12 MED ORDER — SODIUM CHLORIDE 0.9 % IV SOLN: Freq: Once | INTRAVENOUS | Status: AC

## 2024-02-12 NOTE — Progress Notes (Signed)
 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  The ureteral, vascular and all margins of resection were negative for tumor.   CURRENT THERAPY: Adjuvant treatment with immunotherapy with Keytruda  200 Mg IV every 3 weeks.  First dose was given on 02/13/2023.  Status post 16 cycles.  INTERVAL HISTORY: Christina Cameron 58 y.o. female returns to the clinic today for follow-up visit.Discussed the use of AI scribe software for clinical note transcription with the patient, who gave verbal consent to proceed.  History of Present Illness Christina Cameron is a 58 year old female with stage three clear cell renal cell carcinoma who presents for evaluation before starting her last cycle of adjuvant immunotherapy.  She was diagnosed with stage three clear cell renal cell carcinoma of the right kidney in July 2024 and underwent a right radical nephrectomy. She has been receiving adjuvant immunotherapy with pembrolizumab  200 mg IV every three weeks. She has completed sixteen cycles and is here for evaluation before starting the seventeenth and final cycle of her adjuvant therapy.  She feels nervous and excited about completing her treatment. She experiences fatigue as a side effect but reports no other significant issues from the treatment.  She has an abdominal hernia and carpal tunnel syndrome in her right hand, which she plans to address after completing her cancer treatment. The carpal tunnel syndrome is described as  level three severe.  Her current medications remain unchanged.     MEDICAL HISTORY: Past Medical History:  Diagnosis Date   Arm vein blood clot, unspecified laterality    around 20 years ago as of 08/10/21, Patient doesn't remember what caused it. She was not put on blood thinners.   Arthritis    right knee   Asthma    Cancer (HCC)    cervical 1989   Cancer of kidney (HCC)    Chest pain    a. normal cors by cath in 11/2014 / 03/02/2020 nuclear stress test demonstrated no perfusion defects consistent with prior infart or current ischemia. Normal study.   Chronic diastolic (congestive) heart failure (HCC)    09/27/20 Echocardiogram in Epic showed LVEF 60 to 65%.   Complication of anesthesia    post-operative nausea & vomiting   COVID-19    05/29/19 & 05/2020 Covid infections   Depression    Diabetes mellitus, type 2 (HCC)    Dysrhythmia    heart palpitations   GERD (gastroesophageal reflux disease)    H/O cardiovascular stress test 03/02/2020   03/02/2020 Nuclear stress test in Epic showed no perusion defects consistent with prior infarct or ischemia - normal study.   Headache    migraines   High cholesterol    pt takes Crestor    History of cardiac radiofrequency ablation    Hypertension    Last cardiology office visit, 10/22/20 with Prentice Medley, NP ( see note in Epic) as of 08/09/21.   Morbid (severe) obesity due to excess calories (HCC)    Neuromuscular disorder (HCC)  severe neuropathy in feet   Osteoarthritis of knee, unspecified    Palpitations    PONV (postoperative nausea and vomiting)    Post-COVID syndrome 05/2019   Pt experienced chest tightness, sob, palpitations & dizziness after Covid infection. See 04/2020 note from Prentice Medley, NP in [place].   PTSD (post-traumatic stress disorder)    Raynaud's syndrome    Sleep apnea 09/24/2020   sleep study indicated moderate sleep apea   SVT (supraventricular tachycardia) (HCC)    a. s/p ablation by Dr. Waddell in 2006.    Wears glasses    prescripton reading glasses    ALLERGIES:  has no known allergies.  MEDICATIONS:  Current Outpatient Medications  Medication Sig Dispense Refill   methylPREDNISolone  (MEDROL  DOSEPAK) 4 MG TBPK tablet Take per packet instructions (Patient not taking: Reported on 12/11/2023) 21 each 0   albuterol  (VENTOLIN  HFA) 108 (90 Base) MCG/ACT inhaler Inhale 1-2 puffs into the lungs every 6 (six) hours as needed for wheezing or shortness of breath. 18 g 0   brexpiprazole  (REXULTI ) 2 MG TABS tablet Take 2 mg by mouth daily.     buPROPion  (WELLBUTRIN  XL) 150 MG 24 hr tablet Take 150 mg by mouth every morning.     busPIRone  (BUSPAR ) 10 MG tablet Take 10 mg by mouth 2 (two) times daily.     cephALEXin  (KEFLEX ) 500 MG capsule Take 1 capsule (500 mg total) by mouth 3 (three) times daily. 21 capsule 0   cetirizine  (ZYRTEC ) 10 MG tablet Take 10 mg by mouth daily as needed for allergies.     colchicine  0.6 MG tablet Take 2 tabs immediately, then 1 tab twice per day for the duration of the flare up to a max of 7 days (but discontinue for stomach pains or diarrhea) 14 tablet 0   cyclobenzaprine  (FLEXERIL ) 5 MG tablet Take 1 tablet (5 mg total) by mouth 3 (three) times daily as needed for muscle spasms. Do not drive or operate a heavy machinery after taking this medicine. 20 tablet 0   ezetimibe  (ZETIA ) 10 MG tablet Take 1 tablet (10 mg total) by mouth daily. 90 tablet 3   furosemide  (LASIX ) 20 MG tablet Take 1 tablet (20 mg total) by mouth daily. (Patient taking differently: Take 20 mg by mouth daily as needed for fluid or edema.) 90 tablet 3   gabapentin  (NEURONTIN ) 300 MG capsule Take 1 capsule (300 mg total) by mouth 3 (three) times daily. Start at bedtime and slowly increase to 3x a day as needed to make sure no side effects like sedation. (Patient taking differently: Take 300 mg by mouth at bedtime. Start at bedtime and slowly increase to 3x a day as needed to make sure no side effects like  sedation.) 90 capsule 11   hydrOXYzine  (ATARAX ) 50 MG tablet Take 100 mg by mouth at bedtime.     lamoTRIgine  (LAMICTAL ) 200 MG tablet Take 200 mg by mouth at bedtime.     levothyroxine  (SYNTHROID ) 25 MCG tablet Take 1 tablet (25 mcg total) by mouth daily before breakfast. 30 tablet 1   LORazepam  (ATIVAN ) 0.5 MG tablet TAKE 1 TABLET BY MOUTH ONCE DAILY FOR ANXIETY / PANIC ATTACKS.     metFORMIN  (GLUCOPHAGE ) 500 MG tablet Take 1,000 mg by mouth 2 (two) times daily with a meal.     metoprolol  tartrate (LOPRESSOR ) 50 MG tablet Take 2 tablets (100 mg total) by mouth 2 (two) times daily. 180 tablet 3   MOUNJARO 10 MG/0.5ML Pen Inject 10  mg into the skin once a week.     mupirocin  ointment (BACTROBAN ) 2 % Apply 1 Application topically 2 (two) times daily. 30 g 2   nitroGLYCERIN  (NITROSTAT ) 0.4 MG SL tablet DISSOLVE ONE TABLET UNDER THE TONGUE EVERY 5 MINUTES AS NEEDED FOR CHEST PAIN.  DO NOT EXCEED A TOTAL OF 3 DOSES IN 15 MINUTES 75 tablet 0   nystatin (MYCOSTATIN/NYSTOP) powder SMARTSIG:1 Application Topical 2-3 Times Daily     prochlorperazine  (COMPAZINE ) 10 MG tablet Take 1 tablet (10 mg total) by mouth every 6 (six) hours as needed for nausea or vomiting. 30 tablet 1   rosuvastatin  (CRESTOR ) 40 MG tablet Take 1 tablet (40 mg total) by mouth daily. 90 tablet 3   sertraline  (ZOLOFT ) 100 MG tablet Take 200 mg by mouth daily.     tizanidine  (ZANAFLEX ) 2 MG capsule Take 1 capsule (2 mg total) by mouth 3 (three) times daily. 15 capsule 0   topiramate (TOPAMAX) 25 MG tablet Take 25 mg by mouth at bedtime.     No current facility-administered medications for this visit.    SURGICAL HISTORY:  Past Surgical History:  Procedure Laterality Date   CARDIAC CATHETERIZATION N/A 12/14/2014   Procedure: Right/Left Heart Cath and Coronary Angiography;  Surgeon: Ozell Fell, MD;  Location: [facility] INVASIVE CV LAB;  Service: Cardiovascular;  Laterality: N/A;   CARDIAC ELECTROPHYSIOLOGY STUDY AND ABLATION      around 2006, Patient states that heart rate was up to 180 bpm.   CESAREAN SECTION     x2 1990, 2005   CYSTOSCOPY  08/17/2021   Procedure: CYSTOSCOPY;  Surgeon: Sarrah Browning, MD;  Location: [facility];  Service: Gynecology;;   ENDOMETRIAL ABLATION     HYSTEROSCOPY WITH D & C N/A 03/11/2021   Procedure: DILATATION AND CURETTAGE /HYSTEROSCOPY;  Surgeon: Gretta Gums, MD;  Location: [facility];  Service: Gynecology;  Laterality: N/A;   OPERATIVE ULTRASOUND N/A 03/11/2021   Procedure: OPERATIVE ULTRASOUND;  Surgeon: Gretta Gums, MD;  Location: [facility];  Service: Gynecology;  Laterality: N/A;  ultrasound guidance needed   ROBOT ASSISTED LAPAROSCOPIC NEPHRECTOMY Right 12/25/2022   Procedure: XI ROBOTIC ASSISTED LAPAROSCOPIC NEPHRECTOMY;  Surgeon: Sherrilee Belvie CROME, MD;  Location: [facility];  Service: Urology;  Laterality: Right;   ROBOTIC ASSISTED LAPAROSCOPIC HYSTERECTOMY AND SALPINGECTOMY Bilateral 08/17/2021   Procedure: XI ROBOTIC ASSISTED LAPAROSCOPIC HYSTERECTOMY AND  BILATERAL SALPINGECTOMY AND OOPHERETOMY;  Surgeon: Sarrah Browning, MD;  Location: [facility];  Service: Gynecology;  Laterality: Bilateral;    REVIEW OF SYSTEMS:  A comprehensive review of systems was negative.   PHYSICAL EXAMINATION: General appearance: alert, cooperative, fatigued, and no distress Head: Normocephalic, without obvious abnormality, atraumatic Neck: no adenopathy, no JVD, supple, symmetrical, trachea midline, and thyroid  not enlarged, symmetric, no tenderness/mass/nodules Lymph nodes: Cervical, supraclavicular, and axillary nodes normal. Resp: clear to auscultation bilaterally Back: symmetric, no curvature. ROM normal. No CVA tenderness. Cardio: regular rate and rhythm, S1, S2 normal, no murmur, click, rub or gallop GI: soft, non-tender; bowel sounds normal; no masses,  no organomegaly Extremities: extremities normal, atraumatic, no cyanosis or edema  ECOG PERFORMANCE STATUS:  1 - Symptomatic but completely ambulatory  Blood pressure (!) 138/90, pulse 80, temperature (!) 97 F (36.1 C), resp. rate 17, height 5' 6 (1.676 m), weight 259 lb 11.2 oz (117.8 kg), SpO2 98%.  LABORATORY DATA: Lab Results  Component Value Date   WBC 9.6 01/22/2024   HGB 14.8 01/22/2024   HCT 43.4 01/22/2024   MCV  86.3 01/22/2024   PLT 221 01/22/2024      Chemistry      Component Value Date/Time   NA 135 01/22/2024 0849   NA 137 06/08/2023 1049   NA 140 10/19/2011 0919   K 4.1 01/22/2024 0849   K 4.1 10/19/2011 0919   CL 104 01/22/2024 0849   CL 104 10/19/2011 0919   CO2 23 01/22/2024 0849   CO2 24 10/19/2011 0919   BUN 14 01/22/2024 0849   BUN 21 06/08/2023 1049   BUN 16 10/19/2011 0919   CREATININE 0.99 01/22/2024 0849   CREATININE 0.63 10/19/2011 0919   GLU 297 04/15/2019 0000      Component Value Date/Time   CALCIUM  9.8 01/22/2024 0849   CALCIUM  8.5 10/19/2011 0919   ALKPHOS 48 01/22/2024 0849   ALKPHOS 57 10/19/2011 0919   AST 31 01/22/2024 0849   ALT 28 01/22/2024 0849   ALT 24 10/19/2011 0919   BILITOT 0.7 01/22/2024 0849       RADIOGRAPHIC STUDIES: No results found.    ASSESSMENT AND PLAN: This is a very pleasant 58 years old white female recently diagnosed with a stage III (T3a, N0, M0) clear-cell renal cell carcinoma of the right kidney diagnosed in July 2024 status post robotic assisted right radical nephrectomy and the final pathology was consistent with clear-cell renal cell carcinoma with focal rhabdoid features, nuclear grade 4 with tumor size of 10.0 cm and tumor extends into the renal sinus fat and renal vein (pT3a).  The ureteral, vascular and all margins of resection were negative for tumor.  She is currently undergoing adjuvant treatment with immunotherapy with Keytruda  200 Mg IV every 3 weeks.  She started the first cycle of her treatment on 02/13/2023.  Status post 16 cycles.   She has been tolerating this treatment fairly well. Assessment  and Plan Assessment & Plan Stage III clear cell renal cell carcinoma of the right kidney Stage III clear cell renal cell carcinoma of the right kidney, diagnosed in July 2024. Status post right radical nephrectomy. Currently undergoing adjuvant treatment with pembrolizumab , with today being the last cycle (cycle 17). No significant side effects reported, only regular fatigue. Blood counts are stable, allowing for continuation of treatment. - Administer last cycle of pembrolizumab  today. - Order CT scan of the abdomen, pelvis, and possibly chest in one month to assess treatment response. - Evaluate scan results to determine further management, including potential surgical interventions for abdominal hernia and right hand carpal tunnel syndrome.  Fatigue related to immunotherapy Fatigue reported as a regular side effect of pembrolizumab  treatment. No new or worsening symptoms noted. The patient was advised to call immediately if she has any concerning symptoms in the interval. The patient voices understanding of current disease status and treatment options and is in agreement with the current care plan.  All questions were answered. The patient knows to call the clinic with any problems, questions or concerns. We can certainly see the patient much sooner if necessary. The total time spent in the appointment was 20 minutes.  Disclaimer: This note was dictated with voice recognition software. Similar sounding words can inadvertently be transcribed and may not be corrected upon review.

## 2024-02-12 NOTE — Telephone Encounter (Signed)
 Tried calling patient regarding  my chart message with no answer.Dr Sherrilee the scan you have scheduled for December I need to have only the part of the scan you need. The pelvis for the hernia I am in need of having the ct scan earlier having issues with the hernia. I have an appointment with the surgeon Oct 31st so I will need the scan earlier for the surgeon. Can you please get the scan changed for me. I appreciate it

## 2024-02-13 LAB — T4: T4, Total: 7.9 ug/dL (ref 4.5–12.0)

## 2024-02-19 ENCOUNTER — Telehealth: Admitting: Physician Assistant

## 2024-02-19 ENCOUNTER — Encounter

## 2024-02-19 DIAGNOSIS — U071 COVID-19: Secondary | ICD-10-CM

## 2024-02-19 NOTE — Progress Notes (Signed)
  Because of your medical history increasing risk of more complicated course of COVID illness and need to discuss antiviral medications, I feel your condition warrants further evaluation and I recommend that you be seen in a face-to-face visit.   NOTE: There will be NO CHARGE for this E-Visit   If you are having a true medical emergency, please call 911.     For an urgent face to face visit, [facility] has multiple urgent care centers for your convenience.  Click the link below for the full list of locations and hours, walk-in wait times, appointment scheduling options and driving directions:  Urgent Care - [place], [place], [place], [place], [place], [place]  [place]     Your MyChart E-visit questionnaire answers were reviewed by a board certified advanced clinical practitioner to complete your personal care plan based on your specific symptoms.    Thank you for using e-Visits.

## 2024-02-20 ENCOUNTER — Telehealth: Payer: Self-pay

## 2024-02-20 NOTE — Telephone Encounter (Signed)
 Faxed paperwork regarding clearance to [phone] with confirmation.

## 2024-03-04 ENCOUNTER — Ambulatory Visit (HOSPITAL_COMMUNITY)
Admission: RE | Admit: 2024-03-04 | Discharge: 2024-03-04 | Disposition: A | Discharge status: Home / Self Care | Source: Ambulatory Visit | Attending: Internal Medicine | Admitting: Internal Medicine

## 2024-03-04 ENCOUNTER — Inpatient Hospital Stay: Attending: Internal Medicine

## 2024-03-04 DIAGNOSIS — C641 Malignant neoplasm of right kidney, except renal pelvis: Secondary | ICD-10-CM | POA: Diagnosis not present

## 2024-03-04 DIAGNOSIS — Z9221 Personal history of antineoplastic chemotherapy: Secondary | ICD-10-CM | POA: Insufficient documentation

## 2024-03-04 DIAGNOSIS — K439 Ventral hernia without obstruction or gangrene: Secondary | ICD-10-CM | POA: Diagnosis not present

## 2024-03-04 DIAGNOSIS — Z905 Acquired absence of kidney: Secondary | ICD-10-CM | POA: Insufficient documentation

## 2024-03-04 DIAGNOSIS — Z7984 Long term (current) use of oral hypoglycemic drugs: Secondary | ICD-10-CM | POA: Insufficient documentation

## 2024-03-04 DIAGNOSIS — Z7985 Long-term (current) use of injectable non-insulin antidiabetic drugs: Secondary | ICD-10-CM | POA: Insufficient documentation

## 2024-03-04 DIAGNOSIS — R911 Solitary pulmonary nodule: Secondary | ICD-10-CM | POA: Insufficient documentation

## 2024-03-04 DIAGNOSIS — R918 Other nonspecific abnormal finding of lung field: Secondary | ICD-10-CM | POA: Diagnosis not present

## 2024-03-04 DIAGNOSIS — K469 Unspecified abdominal hernia without obstruction or gangrene: Secondary | ICD-10-CM | POA: Insufficient documentation

## 2024-03-04 DIAGNOSIS — Z79899 Other long term (current) drug therapy: Secondary | ICD-10-CM | POA: Diagnosis not present

## 2024-03-04 DIAGNOSIS — I5032 Chronic diastolic (congestive) heart failure: Secondary | ICD-10-CM | POA: Diagnosis not present

## 2024-03-04 DIAGNOSIS — I110 Hypertensive heart disease with heart failure: Secondary | ICD-10-CM | POA: Diagnosis not present

## 2024-03-04 DIAGNOSIS — F32A Depression, unspecified: Secondary | ICD-10-CM | POA: Diagnosis not present

## 2024-03-04 DIAGNOSIS — E7800 Pure hypercholesterolemia, unspecified: Secondary | ICD-10-CM | POA: Diagnosis not present

## 2024-03-04 DIAGNOSIS — E119 Type 2 diabetes mellitus without complications: Secondary | ICD-10-CM | POA: Insufficient documentation

## 2024-03-04 DIAGNOSIS — I700 Atherosclerosis of aorta: Secondary | ICD-10-CM | POA: Diagnosis not present

## 2024-03-04 LAB — CMP (CANCER CENTER ONLY)
ALT: 13 U/L (ref 0–44)
AST: 13 U/L — ABNORMAL LOW (ref 15–41)
Albumin: 4.1 g/dL (ref 3.5–5.0)
Alkaline Phosphatase: 45 U/L (ref 38–126)
Anion gap: 8 (ref 5–15)
BUN: 19 mg/dL (ref 6–20)
CO2: 26 mmol/L (ref 22–32)
Calcium: 9.6 mg/dL (ref 8.9–10.3)
Chloride: 106 mmol/L (ref 98–111)
Creatinine: 1.12 mg/dL — ABNORMAL HIGH (ref 0.44–1.00)
GFR, Estimated: 57 mL/min — ABNORMAL LOW (ref >60)
Glucose, Bld: 174 mg/dL — ABNORMAL HIGH (ref 70–99)
Potassium: 4.3 mmol/L (ref 3.5–5.1)
Sodium: 140 mmol/L (ref 135–145)
Total Bilirubin: 0.4 mg/dL (ref 0.0–1.2)
Total Protein: 7.2 g/dL (ref 6.5–8.1)

## 2024-03-04 LAB — CBC WITH DIFFERENTIAL (CANCER CENTER ONLY)
Abs Immature Granulocytes: 0.06 K/uL (ref 0.00–0.07)
Basophils Absolute: 0.1 K/uL (ref 0.0–0.1)
Basophils Relative: 1 %
Eosinophils Absolute: 0.4 K/uL (ref 0.0–0.5)
Eosinophils Relative: 4 %
HCT: 38.6 % (ref 36.0–46.0)
Hemoglobin: 13 g/dL (ref 12.0–15.0)
Immature Granulocytes: 1 %
Lymphocytes Relative: 24 %
Lymphs Abs: 2 K/uL (ref 0.7–4.0)
MCH: 29 pg (ref 26.0–34.0)
MCHC: 33.7 g/dL (ref 30.0–36.0)
MCV: 86 fL (ref 80.0–100.0)
Monocytes Absolute: 0.7 K/uL (ref 0.1–1.0)
Monocytes Relative: 8 %
Neutro Abs: 5.1 K/uL (ref 1.7–7.7)
Neutrophils Relative %: 62 %
Platelet Count: 212 K/uL (ref 150–400)
RBC: 4.49 MIL/uL (ref 3.87–5.11)
RDW: 12.8 % (ref 11.5–15.5)
WBC Count: 8.2 K/uL (ref 4.0–10.5)
nRBC: 0 % (ref 0.0–0.2)

## 2024-03-04 LAB — TSH: TSH: 4.74 u[IU]/mL — ABNORMAL HIGH (ref 0.350–4.500)

## 2024-03-11 ENCOUNTER — Inpatient Hospital Stay: Admitting: Internal Medicine

## 2024-03-11 ENCOUNTER — Telehealth: Payer: Self-pay | Admitting: Internal Medicine

## 2024-03-11 ENCOUNTER — Encounter: Payer: Self-pay | Admitting: Internal Medicine

## 2024-03-11 VITALS — BP 126/82 | Temp 97.6°F | Resp 17 | Ht 66.0 in | Wt 257.0 lb

## 2024-03-11 DIAGNOSIS — C641 Malignant neoplasm of right kidney, except renal pelvis: Secondary | ICD-10-CM

## 2024-03-11 NOTE — Progress Notes (Signed)
 [facility] [phone]   [phone]  OFFICE PROGRESS NOTE  Dow Longs, PA-C [address] [place] [address]  DIAGNOSIS:  Stage III (T3a, N0, M0) clear-cell renal cell carcinoma of the right kidney diagnosed in July 2024  PRIOR THERAPY:   1) Status post robotic assisted right radical nephrectomy and the final pathology was consistent with clear-cell renal cell carcinoma with focal rhabdoid features, nuclear grade 4 with tumor size of 10.0 cm and tumor extends into the renal sinus fat and renal vein (pT3a).  The ureteral, vascular and all margins of resection were negative for tumor.  2) Adjuvant treatment with immunotherapy with Keytruda  200 Mg IV every 3 weeks.  First dose was given on 02/13/2023.  Status post 17 cycles.  Last dose was completed in September 2025.  CURRENT THERAPY: Observation.  INTERVAL HISTORY: Christina Cameron 58 y.o. female returns to the clinic today for follow-up visit.Discussed the use of AI scribe software for clinical note transcription with the patient, who gave verbal consent to proceed.  History of Present Illness Christina Cameron is a 58 year old female with stage three clear cell renal cell carcinoma who presents for evaluation and repeat CT scan for restaging of her disease.  Diagnosed with stage three clear cell renal cell carcinoma involving the right kidney in July 2024. Underwent a right radical nephrectomy followed by one year of adjuvant immunotherapy, completed in September 2025. Present for evaluation and repeat CT scan of the chest, abdomen, and pelvis for restaging.  She feels tired but has no nausea, vomiting, diarrhea, chest pain, or breathing issues. Aware of an abdominal hernia and scheduled to see a surgeon for further management.     MEDICAL HISTORY: Past Medical History:  Diagnosis Date   Arm vein blood clot, unspecified laterality    around 20 years ago as of 08/10/21, Patient doesn't remember what  caused it. She was not put on blood thinners.   Arthritis    right knee   Asthma    Cancer (HCC)    cervical 1989   Cancer of kidney (HCC)    Chest pain    a. normal cors by cath in 11/2014 / 03/02/2020 nuclear stress test demonstrated no perfusion defects consistent with prior infart or current ischemia. Normal study.   Chronic diastolic (congestive) heart failure (HCC)    09/27/20 Echocardiogram in Epic showed LVEF 60 to 65%.   Complication of anesthesia    post-operative nausea & vomiting   COVID-19    05/29/19 & 05/2020 Covid infections   Depression    Diabetes mellitus, type 2 (HCC)    Dysrhythmia    heart palpitations   GERD (gastroesophageal reflux disease)    H/O cardiovascular stress test 03/02/2020   03/02/2020 Nuclear stress test in Epic showed no perusion defects consistent with prior infarct or ischemia - normal study.   Headache    migraines   High cholesterol    pt takes Crestor    History of cardiac radiofrequency ablation    Hypertension    Last cardiology office visit, 10/22/20 with Prentice Medley, NP ( see note in Epic) as of 08/09/21.   Morbid (severe) obesity due to excess calories (HCC)    Neuromuscular disorder (HCC)    severe neuropathy in feet   Osteoarthritis of knee, unspecified    Palpitations    PONV (postoperative nausea and vomiting)    Post-COVID syndrome 05/2019   Pt experienced chest tightness,  sob, palpitations & dizziness after Covid infection. See 04/2020 note from Prentice Medley, NP in [place].   PTSD (post-traumatic stress disorder)    Raynaud's syndrome    Sleep apnea 09/24/2020   sleep study indicated moderate sleep apea   SVT (supraventricular tachycardia)    a. s/p ablation by Dr. Waddell in 2006.   Wears glasses    prescripton reading glasses    ALLERGIES:  has no known allergies.  MEDICATIONS:  Current Outpatient Medications  Medication Sig Dispense Refill   methylPREDNISolone  (MEDROL  DOSEPAK) 4 MG TBPK tablet Take per packet  instructions (Patient not taking: Reported on 12/11/2023) 21 each 0   albuterol  (VENTOLIN  HFA) 108 (90 Base) MCG/ACT inhaler Inhale 1-2 puffs into the lungs every 6 (six) hours as needed for wheezing or shortness of breath. 18 g 0   brexpiprazole  (REXULTI ) 2 MG TABS tablet Take 2 mg by mouth daily.     buPROPion  (WELLBUTRIN  XL) 150 MG 24 hr tablet Take 150 mg by mouth every morning.     busPIRone  (BUSPAR ) 10 MG tablet Take 10 mg by mouth 2 (two) times daily.     cephALEXin  (KEFLEX ) 500 MG capsule Take 1 capsule (500 mg total) by mouth 3 (three) times daily. 21 capsule 0   cetirizine  (ZYRTEC ) 10 MG tablet Take 10 mg by mouth daily as needed for allergies.     colchicine  0.6 MG tablet Take 2 tabs immediately, then 1 tab twice per day for the duration of the flare up to a max of 7 days (but discontinue for stomach pains or diarrhea) 14 tablet 0   cyclobenzaprine  (FLEXERIL ) 5 MG tablet Take 1 tablet (5 mg total) by mouth 3 (three) times daily as needed for muscle spasms. Do not drive or operate a heavy machinery after taking this medicine. 20 tablet 0   ezetimibe  (ZETIA ) 10 MG tablet Take 1 tablet (10 mg total) by mouth daily. 90 tablet 3   furosemide  (LASIX ) 20 MG tablet Take 1 tablet (20 mg total) by mouth daily. (Patient taking differently: Take 20 mg by mouth daily as needed for fluid or edema.) 90 tablet 3   gabapentin  (NEURONTIN ) 300 MG capsule Take 1 capsule (300 mg total) by mouth 3 (three) times daily. Start at bedtime and slowly increase to 3x a day as needed to make sure no side effects like sedation. (Patient taking differently: Take 300 mg by mouth at bedtime. Start at bedtime and slowly increase to 3x a day as needed to make sure no side effects like sedation.) 90 capsule 11   hydrOXYzine  (ATARAX ) 50 MG tablet Take 100 mg by mouth at bedtime.     lamoTRIgine  (LAMICTAL ) 200 MG tablet Take 200 mg by mouth at bedtime.     levothyroxine  (SYNTHROID ) 25 MCG tablet Take 1 tablet (25 mcg total) by  mouth daily before breakfast. 30 tablet 1   LORazepam  (ATIVAN ) 0.5 MG tablet TAKE 1 TABLET BY MOUTH ONCE DAILY FOR ANXIETY / PANIC ATTACKS.     metFORMIN  (GLUCOPHAGE ) 500 MG tablet Take 1,000 mg by mouth 2 (two) times daily with a meal.     metoprolol  tartrate (LOPRESSOR ) 50 MG tablet Take 2 tablets (100 mg total) by mouth 2 (two) times daily. 180 tablet 3   MOUNJARO 10 MG/0.5ML Pen Inject 10 mg into the skin once a week.     mupirocin  ointment (BACTROBAN ) 2 % Apply 1 Application topically 2 (two) times daily. 30 g 2   nitroGLYCERIN  (NITROSTAT ) 0.4 MG SL tablet  DISSOLVE ONE TABLET UNDER THE TONGUE EVERY 5 MINUTES AS NEEDED FOR CHEST PAIN.  DO NOT EXCEED A TOTAL OF 3 DOSES IN 15 MINUTES 75 tablet 0   nystatin (MYCOSTATIN/NYSTOP) powder SMARTSIG:1 Application Topical 2-3 Times Daily     prochlorperazine  (COMPAZINE ) 10 MG tablet Take 1 tablet (10 mg total) by mouth every 6 (six) hours as needed for nausea or vomiting. 30 tablet 1   rosuvastatin  (CRESTOR ) 40 MG tablet Take 1 tablet (40 mg total) by mouth daily. 90 tablet 3   sertraline  (ZOLOFT ) 100 MG tablet Take 200 mg by mouth daily.     tizanidine  (ZANAFLEX ) 2 MG capsule Take 1 capsule (2 mg total) by mouth 3 (three) times daily. 15 capsule 0   topiramate (TOPAMAX) 25 MG tablet Take 25 mg by mouth at bedtime.     No current facility-administered medications for this visit.    SURGICAL HISTORY:  Past Surgical History:  Procedure Laterality Date   CARDIAC CATHETERIZATION N/A 12/14/2014   Procedure: Right/Left Heart Cath and Coronary Angiography;  Surgeon: Ozell Fell, MD;  Location: [facility] INVASIVE CV LAB;  Service: Cardiovascular;  Laterality: N/A;   CARDIAC ELECTROPHYSIOLOGY STUDY AND ABLATION     around 2006, Patient states that heart rate was up to 180 bpm.   CESAREAN SECTION     x2 1990, 2005   CYSTOSCOPY  08/17/2021   Procedure: CYSTOSCOPY;  Surgeon: Sarrah Browning, MD;  Location: [facility];  Service: Gynecology;;    ENDOMETRIAL ABLATION     HYSTEROSCOPY WITH D & C N/A 03/11/2021   Procedure: DILATATION AND CURETTAGE /HYSTEROSCOPY;  Surgeon: Gretta Gums, MD;  Location: [facility] OR;  Service: Gynecology;  Laterality: N/A;   OPERATIVE ULTRASOUND N/A 03/11/2021   Procedure: OPERATIVE ULTRASOUND;  Surgeon: Gretta Gums, MD;  Location: [facility] OR;  Service: Gynecology;  Laterality: N/A;  ultrasound guidance needed   ROBOT ASSISTED LAPAROSCOPIC NEPHRECTOMY Right 12/25/2022   Procedure: XI ROBOTIC ASSISTED LAPAROSCOPIC NEPHRECTOMY;  Surgeon: Sherrilee Belvie CROME, MD;  Location: [facility];  Service: Urology;  Laterality: Right;   ROBOTIC ASSISTED LAPAROSCOPIC HYSTERECTOMY AND SALPINGECTOMY Bilateral 08/17/2021   Procedure: XI ROBOTIC ASSISTED LAPAROSCOPIC HYSTERECTOMY AND  BILATERAL SALPINGECTOMY AND OOPHERETOMY;  Surgeon: Sarrah Browning, MD;  Location: [facility];  Service: Gynecology;  Laterality: Bilateral;    REVIEW OF SYSTEMS:  Constitutional: positive for fatigue Eyes: negative Ears, nose, mouth, throat, and face: negative Respiratory: negative Cardiovascular: negative Gastrointestinal: negative Genitourinary:negative Integument/breast: negative Hematologic/lymphatic: negative Musculoskeletal:negative Neurological: negative Behavioral/Psych: negative Endocrine: negative Allergic/Immunologic: negative   PHYSICAL EXAMINATION: General appearance: alert, cooperative, fatigued, and no distress Head: Normocephalic, without obvious abnormality, atraumatic Neck: no adenopathy, no JVD, supple, symmetrical, trachea midline, and thyroid  not enlarged, symmetric, no tenderness/mass/nodules Lymph nodes: Cervical, supraclavicular, and axillary nodes normal. Resp: clear to auscultation bilaterally Back: symmetric, no curvature. ROM normal. No CVA tenderness. Cardio: regular rate and rhythm, S1, S2 normal, no murmur, click, rub or gallop GI: soft, non-tender; bowel sounds normal; no masses,  no  organomegaly Extremities: extremities normal, atraumatic, no cyanosis or edema Neurologic: Alert and oriented X 3, normal strength and tone. Normal symmetric reflexes. Normal coordination and gait  ECOG PERFORMANCE STATUS: 1 - Symptomatic but completely ambulatory  Blood pressure 126/82, temperature 97.6 F (36.4 C), resp. rate 17, height 5' 6 (1.676 m), weight 257 lb (116.6 kg).  LABORATORY DATA: Lab Results  Component Value Date   WBC 8.2 03/04/2024   HGB 13.0 03/04/2024   HCT 38.6 03/04/2024   MCV  86.0 03/04/2024   PLT 212 03/04/2024      Chemistry      Component Value Date/Time   NA 140 03/04/2024 0807   NA 137 06/08/2023 1049   NA 140 10/19/2011 0919   K 4.3 03/04/2024 0807   K 4.1 10/19/2011 0919   CL 106 03/04/2024 0807   CL 104 10/19/2011 0919   CO2 26 03/04/2024 0807   CO2 24 10/19/2011 0919   BUN 19 03/04/2024 0807   BUN 21 06/08/2023 1049   BUN 16 10/19/2011 0919   CREATININE 1.12 (H) 03/04/2024 0807   CREATININE 0.63 10/19/2011 0919   GLU 297 04/15/2019 0000      Component Value Date/Time   CALCIUM  9.6 03/04/2024 0807   CALCIUM  8.5 10/19/2011 0919   ALKPHOS 45 03/04/2024 0807   ALKPHOS 57 10/19/2011 0919   AST 13 (L) 03/04/2024 0807   ALT 13 03/04/2024 0807   ALT 24 10/19/2011 0919   BILITOT 0.4 03/04/2024 0807       RADIOGRAPHIC STUDIES: CT CHEST ABDOMEN PELVIS WO CONTRAST Result Date: 03/06/2024 CLINICAL DATA:  Kidney cancer staging, * Tracking Code: BO *. EXAM: CT CHEST, ABDOMEN AND PELVIS WITHOUT CONTRAST TECHNIQUE: Multidetector CT imaging of the chest, abdomen and pelvis was performed following the standard protocol without IV contrast. RADIATION DOSE REDUCTION: This exam was performed according to the departmental dose-optimization program which includes automated exposure control, adjustment of the mA and/or kV according to patient size and/or use of iterative reconstruction technique. COMPARISON:  Multiple priors including CT February 08, 2023, PET-CT February 21, 2023 and CT August 28, 2023. FINDINGS: CT CHEST FINDINGS Cardiovascular: Normal caliber thoracic aorta. Normal size heart. No significant pericardial effusion/thickening. Mediastinum/Nodes: No suspicious thyroid  nodule. No pathologically enlarged mediastinal, hilar or axillary lymph nodes noting limited sensitivity of the hilar structures on noncontrast enhanced examination. The esophagus is grossly unremarkable. Lungs/Pleura: Stable 3 mm solid pulmonary nodule in the superior segment of the right lower lobe on image 73/4. Stable 2 mm solid right lower lobe pulmonary nodule on image 98/4. No new suspicious pulmonary nodules or masses. Musculoskeletal: No aggressive lytic or blastic lesion of bone. CT ABDOMEN PELVIS FINDINGS Hepatobiliary: No suspicious hepatic lesion on this noncontrast enhanced examination. Gallbladder is unremarkable. No biliary ductal dilation. Pancreas: No pancreatic ductal dilation or evidence of acute inflammation. Spleen: No splenomegaly. Adrenals/Urinary Tract: No suspicious adrenal nodule/mass. Right kidney surgically absent without new suspicious soft tissue nodularity in the nephrectomy bed. Left kidneys is without hydronephrosis or contour deforming renal mass. Urinary bladder is unremarkable for degree of distension. Stomach/Bowel: Stomach is unremarkable for degree of distension. No pathologic dilation of small or large bowel. No evidence of acute bowel inflammation. Vascular/Lymphatic: Aortic atherosclerosis. No pathologically enlarged abdominal or pelvic lymph nodes. Reproductive: Status post hysterectomy. No adnexal masses. Other: Large right spigelian type hernia contains fat and nonobstructed portions of bowel. Musculoskeletal: No aggressive lytic or blastic lesion of bone. IMPRESSION: 1. Stable examination status post right nephrectomy without evidence of local recurrence. 2. No convincing evidence of metastatic disease in the chest, abdomen or pelvis on  this noncontrast enhanced examination. 3. Stable tiny solid pulmonary nodules in the right lower lobe measuring up to 3 mm. 4. Large right Spigelian type hernia contains fat and nonobstructed portions of bowel. 5. Aortic atherosclerosis. Aortic Atherosclerosis (ICD10-I70.0). Electronically Signed   By: Reyes Holder M.D.   On: 03/06/2024 09:45      ASSESSMENT AND PLAN: This is a very pleasant 58 years  old white female recently diagnosed with a stage III (T3a, N0, M0) clear-cell renal cell carcinoma of the right kidney diagnosed in July 2024 status post robotic assisted right radical nephrectomy and the final pathology was consistent with clear-cell renal cell carcinoma with focal rhabdoid features, nuclear grade 4 with tumor size of 10.0 cm and tumor extends into the renal sinus fat and renal vein (pT3a).  The ureteral, vascular and all margins of resection were negative for tumor.  She is currently undergoing adjuvant treatment with immunotherapy with Keytruda  200 Mg IV every 3 weeks.  She started the first cycle of her treatment on 02/13/2023.  Status post 17 cycles.   She has been tolerating this treatment fairly well. She had repeat CT scan of the chest, abdomen and pelvis performed recently.  I personally independently reviewed the scan and discussed the result with the patient today.  Her scan showed no concerning findings for disease recurrence or metastasis. Assessment and Plan Assessment & Plan Stage III clear cell renal cell carcinoma Involving the right kidney, diagnosed in July 2024. Status post right radical nephrectomy and one year of adjuvant immunotherapy, completed in September 2025. Currently under surveillance with no concerning findings on recent CT scan. - Continue surveillance with repeat CT scan of the chest, abdomen, and pelvis in six months - Perform laboratory tests in six months - Provide her with a copy of the recent scan results - Instruct her to call if any new symptoms  or changes occur  Pulmonary nodules Tiny nodules in the lung observed on recent CT scan. Likely benign but will continue to monitor for any changes. - Continue surveillance of pulmonary nodules with repeat imaging in six months  Abdominal hernia She is scheduled to see a surgeon on October 31 for evaluation and potential surgical intervention. - Proceed with surgical evaluation on October 31 The patient was advised to call immediately if she has any concerning symptoms in the interval.  The patient voices understanding of current disease status and treatment options and is in agreement with the current care plan.  All questions were answered. The patient knows to call the clinic with any problems, questions or concerns. We can certainly see the patient much sooner if necessary. The total time spent in the appointment was 30 minutes.  Disclaimer: This note was dictated with voice recognition software. Similar sounding words can inadvertently be transcribed and may not be corrected upon review.

## 2024-03-11 NOTE — Telephone Encounter (Signed)
 Scheduled patient for next appointment. Called and could not leave a voicemail, will try again later.

## 2024-03-12 ENCOUNTER — Other Ambulatory Visit: Payer: Self-pay

## 2024-03-14 ENCOUNTER — Encounter: Payer: Self-pay | Admitting: Podiatry

## 2024-03-14 ENCOUNTER — Other Ambulatory Visit: Payer: Self-pay | Admitting: Podiatry

## 2024-03-14 ENCOUNTER — Other Ambulatory Visit: Payer: Self-pay | Admitting: Urology

## 2024-03-14 MED ORDER — AMMONIUM LACTATE 12 % EX CREA: 1.0000 | TOPICAL_CREAM | CUTANEOUS | 2 refills | Status: AC | PRN

## 2024-03-20 ENCOUNTER — Other Ambulatory Visit: Payer: Self-pay

## 2024-03-20 DIAGNOSIS — G5601 Carpal tunnel syndrome, right upper limb: Secondary | ICD-10-CM

## 2024-03-25 ENCOUNTER — Encounter

## 2024-03-29 ENCOUNTER — Telehealth: Admitting: Family Medicine

## 2024-03-29 DIAGNOSIS — K047 Periapical abscess without sinus: Secondary | ICD-10-CM

## 2024-03-29 MED ORDER — DOXYCYCLINE HYCLATE 100 MG PO TABS: 100.0000 mg | ORAL_TABLET | Freq: Two times a day (BID) | ORAL | 0 refills | Status: AC

## 2024-03-29 NOTE — Progress Notes (Signed)
 E-Visit for Dental Pain  We are sorry that you are not feeling well.  Here is how we plan to help!  Based on what you have shared with me in the questionnaire, it sounds like you have dental infection.  I have sent doxycycline . Please continue to try to reach your dentist. Go to urgent care if symptoms persist or worsen.   It is imperative that you see a dentist within 10 days of this eVisit to determine the cause of the dental pain and be sure it is adequately treated  A toothache or tooth pain is caused when the nerve in the root of a tooth or surrounding a tooth is irritated. Dental (tooth) infection, decay, injury, or loss of a tooth are the most common causes of dental pain. Pain may also occur after an extraction (tooth is pulled out). Pain sometimes originates from other areas and radiates to the jaw, thus appearing to be tooth pain.Bacteria growing inside your mouth can contribute to gum disease and dental decay, both of which can cause pain. A toothache occurs from inflammation of the central portion of the tooth called pulp. The pulp contains nerve endings that are very sensitive to pain. Inflammation to the pulp or pulpitis may be caused by dental cavities, trauma, and infection.    HOME CARE:   For toothaches: Over-the-counter pain medications such as acetaminophen  or ibuprofen  may be used. Take these as directed on the package while you arrange for a dental appointment. Avoid very cold or hot foods, because they may make the pain worse. You may get relief from biting on a cotton ball soaked in oil of cloves. You can get oil of cloves at most drug stores.  For jaw pain:  Aspirin  may be helpful for problems in the joint of the jaw in adults. If pain happens every time you open your mouth widely, the temporomandibular joint (TMJ) may be the source of the pain. Yawning or taking a large bite of food may worsen the pain. An appointment with your doctor or dentist will help you find the  cause.     GET HELP RIGHT AWAY IF:  You have a high fever or chills If you have had a recent head or face injury and develop headache, light headedness, nausea, vomiting, or other symptoms that concern you after an injury to your face or mouth, you could have a more serious injury in addition to your dental injury. A facial rash associated with a toothache: This condition may improve with medication. Contact your doctor for them to decide what is appropriate. Any jaw pain occurring with chest pain: Although jaw pain is most commonly caused by dental disease, it is sometimes referred pain from other areas. People with heart disease, especially people who have had stents placed, people with diabetes, or those who have had heart surgery may have jaw pain as a symptom of heart attack or angina. If your jaw or tooth pain is associated with lightheadedness, sweating, or shortness of breath, you should see a doctor as soon as possible. Trouble swallowing or excessive pain or bleeding from gums: If you have a history of a weakened immune system, diabetes, or steroid use, you may be more susceptible to infections. Infections can often be more severe and extensive or caused by unusual organisms. Dental and gum infections in people with these conditions may require more aggressive treatment. An abscess may need draining or IV antibiotics, for example.  MAKE SURE YOU   Understand these  instructions. Will watch your condition. Will get help right away if you are not doing well or get worse.  Thank you for choosing an e-visit.  Your e-visit answers were reviewed by a board certified advanced clinical practitioner to complete your personal care plan. Depending upon the condition, your plan could have included both over the counter or prescription medications.  Please review your pharmacy choice. Make sure the pharmacy is open so you can pick up prescription now. If there is a problem, you may contact your  provider through [employer] and have the prescription routed to another pharmacy.  Your safety is important to us . If you have drug allergies check your prescription carefully.   For the next 24 hours you can use MyChart to ask questions about today's visit, request a non-urgent call back, or ask for a work or school excuse. You will get an email in the next two days asking about your experience. I hope that your e-visit has been valuable and will speed your recovery.  I have spent 5 minutes in review of e-visit questionnaire, review and updating patient chart, medical decision making and response to patient.   Illiana Losurdo, FNP

## 2024-03-31 ENCOUNTER — Encounter: Payer: Self-pay | Admitting: Radiology

## 2024-04-09 ENCOUNTER — Other Ambulatory Visit: Payer: Self-pay | Admitting: Orthopedic Surgery

## 2024-04-09 MED ORDER — TRAMADOL HCL 50 MG PO TABS: 50.0000 mg | ORAL_TABLET | Freq: Four times a day (QID) | ORAL | 0 refills | Status: DC | PRN

## 2024-04-10 DIAGNOSIS — G5601 Carpal tunnel syndrome, right upper limb: Secondary | ICD-10-CM | POA: Diagnosis not present

## 2024-04-11 ENCOUNTER — Telehealth: Payer: Self-pay

## 2024-04-11 ENCOUNTER — Other Ambulatory Visit: Payer: Self-pay | Admitting: Orthopedic Surgery

## 2024-04-11 NOTE — Telephone Encounter (Signed)
 Called and advised pt.

## 2024-04-11 NOTE — Telephone Encounter (Signed)
 Patient called stating that her right hand was swollen and purple on yesterday, so she called the on call provider and was advised that it was normal and to use ice. Stated that she is having shooting pain in her fingers and near her incision and that the pain medicine is not helping.  Would like a call back at [phone].  Please advise.  Thank you.

## 2024-04-12 ENCOUNTER — Other Ambulatory Visit: Payer: Self-pay | Admitting: Internal Medicine

## 2024-04-22 NOTE — Progress Notes (Unsigned)
   Christina Cameron - 58 y.o. female MRN [id number]  Date of birth: July 26, 1965  Office Visit Note: Visit Date: 04/23/2024 PCP: Dow Longs, PA-C Referred by: Dow Longs, PA-C  Subjective:  HPI: Christina Cameron is a 58 y.o. female who presents today for follow up 2 weeks status post right wrist endoscopic carpal tunnel release.  Pertinent ROS were reviewed with the patient and found to be negative unless otherwise specified above in HPI.   Assessment & Plan: Visit Diagnoses: No diagnosis found.  Plan: ***  Follow-up: No follow-ups on file.   Meds & Orders: No orders of the defined types were placed in this encounter.  No orders of the defined types were placed in this encounter.    Procedures: No procedures performed       Objective:   Vital Signs: There were no vitals taken for this visit.  Ortho Exam ***  Imaging: No results found.   Jaycub Noorani Afton Alderton, M.D. [facility], Hand Surgery

## 2024-04-23 ENCOUNTER — Encounter (HOSPITAL_COMMUNITY): Payer: Self-pay

## 2024-04-23 ENCOUNTER — Ambulatory Visit (INDEPENDENT_AMBULATORY_CARE_PROVIDER_SITE_OTHER): Admitting: Orthopedic Surgery

## 2024-04-23 ENCOUNTER — Telehealth: Payer: Self-pay | Admitting: Orthopedic Surgery

## 2024-04-23 ENCOUNTER — Ambulatory Visit (HOSPITAL_COMMUNITY): Admitting: Occupational Therapy

## 2024-04-23 DIAGNOSIS — Z9889 Other specified postprocedural states: Secondary | ICD-10-CM

## 2024-04-23 NOTE — Telephone Encounter (Signed)
 Patient needs a appointment for 4 wks post op.CB#[phone]

## 2024-04-23 NOTE — Telephone Encounter (Signed)
LMOM to c/b and schedule

## 2024-04-29 ENCOUNTER — Ambulatory Visit (HOSPITAL_COMMUNITY): Admitting: Occupational Therapy

## 2024-05-01 ENCOUNTER — Ambulatory Visit (HOSPITAL_COMMUNITY): Admitting: Occupational Therapy

## 2024-05-01 ENCOUNTER — Other Ambulatory Visit: Payer: Self-pay

## 2024-05-01 ENCOUNTER — Encounter (HOSPITAL_COMMUNITY): Payer: Self-pay | Admitting: Occupational Therapy

## 2024-05-01 DIAGNOSIS — M79641 Pain in right hand: Secondary | ICD-10-CM | POA: Insufficient documentation

## 2024-05-01 DIAGNOSIS — G5601 Carpal tunnel syndrome, right upper limb: Secondary | ICD-10-CM | POA: Diagnosis not present

## 2024-05-01 DIAGNOSIS — R29898 Other symptoms and signs involving the musculoskeletal system: Secondary | ICD-10-CM | POA: Diagnosis present

## 2024-05-01 DIAGNOSIS — M25641 Stiffness of right hand, not elsewhere classified: Secondary | ICD-10-CM | POA: Diagnosis present

## 2024-05-01 NOTE — Patient Instructions (Signed)
AROM Exercises   1) Wrist Flexion  Start with wrist at edge of table, palm facing up. With wrist hanging slightly off table, curl wrist upward, and back down.      2) Wrist Extension  Start with wrist at edge of table, palm facing down. With wrist slightly off the edge of the table, curl wrist up and back down.      3) Radial Deviations  Start with forearm flat against a table, wrist hanging slightly off the edge, and palm facing the wall. Bending at the wrist only, and keeping palm facing the wall, bend wrist so fist is pointing towards the floor, back up to start position, and up towards the ceiling. Return to start.        4) WRIST PRONATION  Turn your forearm towards palm face down.  Keep your elbow bent and by the side of your  Body.      5) WRIST SUPINATION  Turn your forearm towards palm face up.  Keep your elbow bent and by the side of your  Body.      *Complete exercises 10-15 times each, 2-3 times per day*  

## 2024-05-01 NOTE — Therapy (Signed)
 OUTPATIENT OCCUPATIONAL THERAPY ORTHO EVALUATION  Patient Name: Christina Cameron MRN: [id number] DOB:Jul 25, 1965, 58 y.o., female Today's Date: 05/01/2024  PCP: Dow Longs, PA-C REFERRING PROVIDER: Arlinda Buster, MD  END OF SESSION:  OT End of Session - 05/01/24 2328     Visit Number 1    Number of Visits 8    Date for Recertification  06/27/24    Authorization Type UHC Dual Complete    Authorization Time Period No auth required    Progress Note Due on Visit 10    OT Start Time [phone]    OT Stop Time 1019    OT Time Calculation (min) 31 min    Activity Tolerance Patient tolerated treatment well    Behavior During Therapy [facility] for tasks assessed/performed          Past Medical History:  Diagnosis Date   Arm vein blood clot, unspecified laterality    around 20 years ago as of 08/10/21, Patient doesn't remember what caused it. She was not put on blood thinners.   Arthritis    right knee   Asthma    Cancer (HCC)    cervical 1989   Cancer of kidney (HCC)    Chest pain    a. normal cors by cath in 11/2014 / 03/02/2020 nuclear stress test demonstrated no perfusion defects consistent with prior infart or current ischemia. Normal study.   Chronic diastolic (congestive) heart failure (HCC)    09/27/20 Echocardiogram in Epic showed LVEF 60 to 65%.   Complication of anesthesia    post-operative nausea & vomiting   COVID-19    05/29/19 & 05/2020 Covid infections   Depression    Diabetes mellitus, type 2 (HCC)    Dysrhythmia    heart palpitations   GERD (gastroesophageal reflux disease)    H/O cardiovascular stress test 03/02/2020   03/02/2020 Nuclear stress test in Epic showed no perusion defects consistent with prior infarct or ischemia - normal study.   Headache    migraines   High cholesterol    pt takes Crestor    History of cardiac radiofrequency ablation    Hypertension    Last cardiology office visit, 10/22/20 with Prentice Medley, NP ( see note in Epic) as of 08/09/21.    Morbid (severe) obesity due to excess calories (HCC)    Neuromuscular disorder (HCC)    severe neuropathy in feet   Osteoarthritis of knee, unspecified    Palpitations    PONV (postoperative nausea and vomiting)    Post-COVID syndrome 05/2019   Pt experienced chest tightness, sob, palpitations & dizziness after Covid infection. See 04/2020 note from Prentice Medley, NP in [place].   PTSD (post-traumatic stress disorder)    Raynaud's syndrome    Sleep apnea 09/24/2020   sleep study indicated moderate sleep apea   SVT (supraventricular tachycardia)    a. s/p ablation by Dr. Waddell in 2006.   Wears glasses    prescripton reading glasses   Past Surgical History:  Procedure Laterality Date   CARDIAC CATHETERIZATION N/A 12/14/2014   Procedure: Right/Left Heart Cath and Coronary Angiography;  Surgeon: Ozell Fell, MD;  Location: [facility] INVASIVE CV LAB;  Service: Cardiovascular;  Laterality: N/A;   CARDIAC ELECTROPHYSIOLOGY STUDY AND ABLATION     around 2006, Patient states that heart rate was up to 180 bpm.   CESAREAN SECTION     x2 1990, 2005   CYSTOSCOPY  08/17/2021   Procedure: CYSTOSCOPY;  Surgeon: Sarrah Browning, MD;  Location: [facility];  Service: Gynecology;;   ENDOMETRIAL ABLATION     HYSTEROSCOPY WITH D & C N/A 03/11/2021   Procedure: DILATATION AND CURETTAGE /HYSTEROSCOPY;  Surgeon: Gretta Gums, MD;  [facility];  Service: Gynecology;  Laterality: N/A;   OPERATIVE ULTRASOUND N/A 03/11/2021   Procedure: OPERATIVE ULTRASOUND;  Surgeon: Gretta Gums, MD;  Location: [facility] OR;  Service: Gynecology;  Laterality: N/A;  ultrasound guidance needed   ROBOT ASSISTED LAPAROSCOPIC NEPHRECTOMY Right 12/25/2022   Procedure: XI ROBOTIC ASSISTED LAPAROSCOPIC NEPHRECTOMY;  Surgeon: Sherrilee Belvie CROME, MD;  Location: [facility];  Service: Urology;  Laterality: Right;   ROBOTIC ASSISTED LAPAROSCOPIC HYSTERECTOMY AND SALPINGECTOMY Bilateral 08/17/2021   Procedure: XI ROBOTIC ASSISTED  LAPAROSCOPIC HYSTERECTOMY AND  BILATERAL SALPINGECTOMY AND OOPHERETOMY;  Surgeon: Sarrah Browning, MD;  Location: [facility];  Service: Gynecology;  Laterality: Bilateral;   Patient Active Problem List   Diagnosis Date Noted   Encounter for antineoplastic immunotherapy 04/11/2023   Primary renal cell carcinoma of right kidney (HCC) 01/30/2023   History of right radical nephrectomy 12/28/2022   Right renal mass 12/25/2022   Raynaud's phenomenon 09/06/2022   Sensorineural hearing loss (SNHL) of both ears 09/06/2022   Tinnitus of both ears 09/06/2022   Cardiac chest pain 05/01/2022   Moderate obstructive sleep apnea 01/23/2022   Heart failure with preserved ejection fraction (HCC) 09/19/2021   Abnormal vaginal bleeding in postmenopausal patient 08/17/2021   Paroxysmal SVT (supraventricular tachycardia) 09/17/2019   Upper airway cough syndrome vs atypical asthma/ cough variant  09/17/2019   Anxiety 09/15/2019   Essential hypertension, benign 05/07/2019   Chronic diastolic (congestive) heart failure (HCC)    HLD (hyperlipidemia)    Morbid (severe) obesity due to excess calories (HCC)    Osteoarthritis of knee, unspecified    Palpitations    Somnolence    Type 2 diabetes mellitus with hyperglycemia (HCC)    Raynaud's syndrome    Hypertensive disorder 09/21/2017   Diabetes mellitus (HCC) 09/21/2017   DOE (dyspnea on exertion)    Precordial chest pain 09/08/2014    ONSET DATE: 04/10/24  REFERRING DIAG: G56.01 (ICD-10-CM) - Carpal tunnel syndrome, right upper limb   THERAPY DIAG:  Pain in right hand  Stiffness of right hand, not elsewhere classified  Other symptoms and signs involving the musculoskeletal system  Rationale for Evaluation and Treatment: Rehabilitation  SUBJECTIVE:   SUBJECTIVE STATEMENT: It is just stiff and swollen Pt accompanied by: self  PERTINENT HISTORY: status post right wrist endoscopic carpal tunnel release. PMH significant for  Kidney cancer with active treatment.   PRECAUTIONS: None  WEIGHT BEARING RESTRICTIONS: No  PAIN:  Are you having pain? No  FALLS: Has patient fallen in last 6 months? No  PLOF: Independent  PATIENT GOALS: To be able to grip  NEXT MD VISIT: 05/26/24  OBJECTIVE:  Note: Objective measures were completed at Evaluation unless otherwise noted.  HAND DOMINANCE: Right  ADLs: Overall ADLs: Pt unable to manipulate buttons, zippers, clasps. She also reports inability to grasp and hold objects or pick up small objects. She is limited in dressing and bathing. Pt can only use her L hand for cooking and cleaning because she can't grip anything with her R and drops everything.   UPPER EXTREMITY ROM:     Active ROM Right eval  Wrist flexion 42  Wrist extension 48  Wrist ulnar deviation 42  Wrist radial deviation 28  Wrist pronation 90  Wrist supination 80  (Blank rows = not tested)  Active ROM Right eval  Thumb MCP (0-60) 45  Thumb IP (0-80) 25  Thumb Opposition to Small Finger able  Index MCP (0-90) 60  Index PIP (0-100) 80  Index DIP (0-70)  50  Long MCP (0-90)  65  Long PIP (0-100)  65  Long DIP (0-70)  40  Ring MCP (0-90)  55  Ring PIP (0-100) 75   Ring DIP (0-70)  50  Little MCP (0-90)  70  Little PIP (0-100) 80   Little DIP (0-70)  65  (Blank rows = not tested)   UPPER EXTREMITY MMT:     MMT Right eval  Wrist flexion 4/5  Wrist extension 4+/5  Wrist ulnar deviation 4/5  Wrist radial deviation 4+/5  Wrist pronation 4+/5  Wrist supination 4+/5  (Blank rows = not tested)  HAND FUNCTION: Grip strength: Right: 27 lbs; Left: 46 lbs, Lateral pinch: Right: 10 lbs, Left: 10 lbs, and 3 point pinch: Right: 7 lbs, Left: 7 lbs  COORDINATION: 9 Hole Peg test: Right: 31.86 sec; Left: 1 min 5 sec  SENSATION: Numbness in D1-D3 finger tips  EDEMA: R Wrist: 19.1 cm L Wrist: 18.4 cm                R MCP: 21.0 cm L: MCP: 20.8 cm  OBSERVATIONS: tremors with increased  effort   TREATMENT DATE:   05/01/24 -Wrist ROM: flexion, extension, ulnar/radial deviation, supination/pronation, x10                                                                                                                               PATIENT EDUCATION: Education details: Wrist ROM Person educated: Patient Education method: Explanation, Demonstration, and Handouts Education comprehension: verbalized understanding and returned demonstration  HOME EXERCISE PROGRAM: 12/4: wrist ROM  GOALS: Goals reviewed with patient? Yes  SHORT TERM GOALS: Target date: 06/27/24  Pt will be educated and provided HEP for RUE mobility and strength in order to complete ADL's independently.   Goal status: INITIAL  2.  Pt will decreased RUE pain to 3/10 or less in order to sleep for 3+ consecutive hours without waking due to pain.   Goal status: INITIAL  3.  Pt will increase RUE ROM by 10+ degrees in order to grasp and manipulate items during dressing and bathing.   Goal status: INITIAL  4.  Pt will increased RUE strength to 5/5 in order to lift and carry groceries while putting them away.   Goal status: INITIAL  5.  Pt will increase RUE grip by 10# and pinch by 1# in order to grasp and manipulate items during cooking and cleaning tasks.   Goal status: INITIAL  6.  Pt will increase RUE coordination by completing 9 hole peg test in 28 sec or less in order to manipulate buttons, zippers, and clasps independently.   Goal status: INITIAL   ASSESSMENT:  CLINICAL IMPRESSION: Patient is a 58 y.o. female who was seen today for occupational therapy  evaluation for R carpal tunnel syndrome s/p surgical release. Pt presents with increased weakness, swelling, stiffness, and decreased ability to complete functional ADL's and IADL's independently.   PERFORMANCE DEFICITS: in functional skills including ADLs, IADLs, coordination, dexterity, sensation, edema, ROM, strength, pain, fascial  restrictions, muscle spasms, Fine motor control, Gross motor control, body mechanics, and UE functional use.  IMPAIRMENTS: are limiting patient from ADLs, IADLs, rest and sleep, work, leisure, and social participation.   COMORBIDITIES: may have co-morbidities  that affects occupational performance. Patient will benefit from skilled OT to address above impairments and improve overall function.  MODIFICATION OR ASSISTANCE TO COMPLETE EVALUATION: Min-Moderate modification of tasks or assist with assess necessary to complete an evaluation.  OT OCCUPATIONAL PROFILE AND HISTORY: Detailed assessment: Review of records and additional review of physical, cognitive, psychosocial history related to current functional performance.  CLINICAL DECISION MAKING: Moderate - several treatment options, min-mod task modification necessary  REHAB POTENTIAL: Good  EVALUATION COMPLEXITY: Moderate      PLAN:  OT FREQUENCY: 2x/week  OT DURATION: 4 weeks  PLANNED INTERVENTIONS: 97168 OT Re-evaluation, 97535 self care/ADL training, 02889 therapeutic exercise, 97530 therapeutic activity, 97112 neuromuscular re-education, 97140 manual therapy, 97035 ultrasound, 97018 paraffin, 02989 moist heat, 97010 cryotherapy, 97034 contrast bath, 97032 electrical stimulation (manual), 97760 Orthotic Initial, 97763 Orthotic/Prosthetic subsequent, passive range of motion, functional mobility training, energy conservation, coping strategies training, patient/family education, and DME and/or AE instructions  RECOMMENDED OTHER SERVICES: N/A  CONSULTED AND AGREED WITH PLAN OF CARE: Patient  PLAN FOR NEXT SESSION: Manual Therapy, stretching, ROM, gripping  Valentin Nightingale, OTR/L [facility] [phone] Valentin Jillyn Nightingale, OT 05/01/2024, 11:30 PM

## 2024-05-06 ENCOUNTER — Encounter: Payer: Self-pay | Admitting: Internal Medicine

## 2024-05-06 ENCOUNTER — Other Ambulatory Visit: Payer: Self-pay | Admitting: Internal Medicine

## 2024-05-06 ENCOUNTER — Ambulatory Visit (HOSPITAL_COMMUNITY): Admitting: Occupational Therapy

## 2024-05-08 ENCOUNTER — Ambulatory Visit (HOSPITAL_COMMUNITY): Admitting: Occupational Therapy

## 2024-05-13 ENCOUNTER — Other Ambulatory Visit

## 2024-05-13 ENCOUNTER — Ambulatory Visit (HOSPITAL_COMMUNITY): Admitting: Occupational Therapy

## 2024-05-13 ENCOUNTER — Encounter (HOSPITAL_COMMUNITY): Payer: Self-pay | Admitting: Occupational Therapy

## 2024-05-13 DIAGNOSIS — M25641 Stiffness of right hand, not elsewhere classified: Secondary | ICD-10-CM

## 2024-05-13 DIAGNOSIS — R29898 Other symptoms and signs involving the musculoskeletal system: Secondary | ICD-10-CM

## 2024-05-13 DIAGNOSIS — M79641 Pain in right hand: Secondary | ICD-10-CM

## 2024-05-13 NOTE — Therapy (Signed)
 OUTPATIENT OCCUPATIONAL THERAPY ORTHO TREATMENT NOTE  Patient Name: Christina Cameron MRN: [id number] DOB:06-Apr-1966, 58 y.o., female Today's Date: 05/13/2024  PCP: Dow Longs, PA-C REFERRING PROVIDER: Arlinda Buster, MD  END OF SESSION:  OT End of Session - 05/13/24 1254     Visit Number 2    Number of Visits 8    Date for Recertification  06/27/24    Authorization Type UHC Dual Complete    Authorization Time Period No auth required    Progress Note Due on Visit 10    OT Start Time [phone]    OT Stop Time 1034    OT Time Calculation (min) 38 min    Activity Tolerance Patient tolerated treatment well    Behavior During Therapy [name] for tasks assessed/performed           Past Medical History:  Diagnosis Date   Arm vein blood clot, unspecified laterality    around 20 years ago as of 08/10/21, Patient doesn't remember what caused it. She was not put on blood thinners.   Arthritis    right knee   Asthma    Cancer (HCC)    cervical 1989   Cancer of kidney (HCC)    Chest pain    a. normal cors by cath in 11/2014 / 03/02/2020 nuclear stress test demonstrated no perfusion defects consistent with prior infart or current ischemia. Normal study.   Chronic diastolic (congestive) heart failure (HCC)    09/27/20 Echocardiogram in Epic showed LVEF 60 to 65%.   Complication of anesthesia    post-operative nausea & vomiting   COVID-19    05/29/19 & 05/2020 Covid infections   Depression    Diabetes mellitus, type 2 (HCC)    Dysrhythmia    heart palpitations   GERD (gastroesophageal reflux disease)    H/O cardiovascular stress test 03/02/2020   03/02/2020 Nuclear stress test in Epic showed no perusion defects consistent with prior infarct or ischemia - normal study.   Headache    migraines   High cholesterol    pt takes Crestor    History of cardiac radiofrequency ablation    Hypertension    Last cardiology office visit, 10/22/20 with Prentice Medley, NP ( see note in Epic) as of 08/09/21.    Morbid (severe) obesity due to excess calories (HCC)    Neuromuscular disorder (HCC)    severe neuropathy in feet   Osteoarthritis of knee, unspecified    Palpitations    PONV (postoperative nausea and vomiting)    Post-COVID syndrome 05/2019   Pt experienced chest tightness, sob, palpitations & dizziness after Covid infection. See 04/2020 note from Prentice Medley, NP in [place].   PTSD (post-traumatic stress disorder)    Raynaud's syndrome    Sleep apnea 09/24/2020   sleep study indicated moderate sleep apea   SVT (supraventricular tachycardia)    a. s/p ablation by Dr. Waddell in 2006.   Wears glasses    prescripton reading glasses   Past Surgical History:  Procedure Laterality Date   CARDIAC CATHETERIZATION N/A 12/14/2014   Procedure: Right/Left Heart Cath and Coronary Angiography;  Surgeon: Ozell Fell, MD;  [facility] INVASIVE CV LAB;  Service: Cardiovascular;  Laterality: N/A;   CARDIAC ELECTROPHYSIOLOGY STUDY AND ABLATION     around 2006, Patient states that heart rate was up to 180 bpm.   CESAREAN SECTION     x2 1990, 2005   CYSTOSCOPY  08/17/2021   Procedure: CYSTOSCOPY;  Surgeon: Sarrah Browning, MD;  Location: [place]  [facility];  Service: Gynecology;;   ENDOMETRIAL ABLATION     HYSTEROSCOPY WITH D & C N/A 03/11/2021   Procedure: DILATATION AND CURETTAGE /HYSTEROSCOPY;  Surgeon: Gretta Gums, MD;  Location: [facility];  Service: Gynecology;  Laterality: N/A;   OPERATIVE ULTRASOUND N/A 03/11/2021   Procedure: OPERATIVE ULTRASOUND;  Surgeon: Gretta Gums, MD;  Location: [facility];  Service: Gynecology;  Laterality: N/A;  ultrasound guidance needed   ROBOT ASSISTED LAPAROSCOPIC NEPHRECTOMY Right 12/25/2022   Procedure: XI ROBOTIC ASSISTED LAPAROSCOPIC NEPHRECTOMY;  Surgeon: Sherrilee Belvie CROME, MD;  Location: [facility];  Service: Urology;  Laterality: Right;   ROBOTIC ASSISTED LAPAROSCOPIC HYSTERECTOMY AND SALPINGECTOMY Bilateral 08/17/2021   Procedure: XI ROBOTIC  ASSISTED LAPAROSCOPIC HYSTERECTOMY AND  BILATERAL SALPINGECTOMY AND OOPHERETOMY;  Surgeon: Sarrah Browning, MD;  Location: [facility];  Service: Gynecology;  Laterality: Bilateral;   Patient Active Problem List   Diagnosis Date Noted   Encounter for antineoplastic immunotherapy 04/11/2023   Primary renal cell carcinoma of right kidney (HCC) 01/30/2023   History of right radical nephrectomy 12/28/2022   Right renal mass 12/25/2022   Raynaud's phenomenon 09/06/2022   Sensorineural hearing loss (SNHL) of both ears 09/06/2022   Tinnitus of both ears 09/06/2022   Cardiac chest pain 05/01/2022   Moderate obstructive sleep apnea 01/23/2022   Heart failure with preserved ejection fraction (HCC) 09/19/2021   Abnormal vaginal bleeding in postmenopausal patient 08/17/2021   Paroxysmal SVT (supraventricular tachycardia) 09/17/2019   Upper airway cough syndrome vs atypical asthma/ cough variant  09/17/2019   Anxiety 09/15/2019   Essential hypertension, benign 05/07/2019   Chronic diastolic (congestive) heart failure (HCC)    HLD (hyperlipidemia)    Morbid (severe) obesity due to excess calories (HCC)    Osteoarthritis of knee, unspecified    Palpitations    Somnolence    Type 2 diabetes mellitus with hyperglycemia (HCC)    Raynaud's syndrome    Hypertensive disorder 09/21/2017   Diabetes mellitus (HCC) 09/21/2017   DOE (dyspnea on exertion)    Precordial chest pain 09/08/2014    ONSET DATE: 04/10/24  REFERRING DIAG: G56.01 (ICD-10-CM) - Carpal tunnel syndrome, right upper limb   THERAPY DIAG:  Pain in right hand  Stiffness of right hand, not elsewhere classified  Other symptoms and signs involving the musculoskeletal system  Rationale for Evaluation and Treatment: Rehabilitation  SUBJECTIVE:   SUBJECTIVE STATEMENT: It's really really stiff Pt accompanied by: self  PERTINENT HISTORY: status post right wrist endoscopic carpal tunnel release. PMH significant  for Kidney cancer with active treatment.   PRECAUTIONS: None  WEIGHT BEARING RESTRICTIONS: No  PAIN:  Are you having pain? No  FALLS: Has patient fallen in last 6 months? No  PLOF: Independent  PATIENT GOALS: To be able to grip  NEXT MD VISIT: 05/26/24  OBJECTIVE:  Note: Objective measures were completed at Evaluation unless otherwise noted.  HAND DOMINANCE: Right  ADLs: Overall ADLs: Pt unable to manipulate buttons, zippers, clasps. She also reports inability to grasp and hold objects or pick up small objects. She is limited in dressing and bathing. Pt can only use her L hand for cooking and cleaning because she can't grip anything with her R and drops everything.   UPPER EXTREMITY ROM:     Active ROM Right eval  Wrist flexion 42  Wrist extension 48  Wrist ulnar deviation 42  Wrist radial deviation 28  Wrist pronation 90  Wrist supination 80  (Blank rows = not tested)  Active ROM Right  eval  Thumb MCP (0-60) 45  Thumb IP (0-80) 25  Thumb Opposition to Small Finger able  Index MCP (0-90) 60  Index PIP (0-100) 80  Index DIP (0-70)  50  Long MCP (0-90)  65  Long PIP (0-100)  65  Long DIP (0-70)  40  Ring MCP (0-90)  55  Ring PIP (0-100) 75   Ring DIP (0-70)  50  Little MCP (0-90)  70  Little PIP (0-100) 80   Little DIP (0-70)  65  (Blank rows = not tested)   UPPER EXTREMITY MMT:     MMT Right eval  Wrist flexion 4/5  Wrist extension 4+/5  Wrist ulnar deviation 4/5  Wrist radial deviation 4+/5  Wrist pronation 4+/5  Wrist supination 4+/5  (Blank rows = not tested)  HAND FUNCTION: Grip strength: Right: 27 lbs; Left: 46 lbs, Lateral pinch: Right: 10 lbs, Left: 10 lbs, and 3 point pinch: Right: 7 lbs, Left: 7 lbs  COORDINATION: 9 Hole Peg test: Right: 31.86 sec; Left: 1 min 5 sec  SENSATION: Numbness in D1-D3 finger tips  EDEMA: R Wrist: 19.1 cm L Wrist: 18.4 cm                R MCP: 21.0 cm L: MCP: 20.8 cm  OBSERVATIONS: tremors with  increased effort   TREATMENT DATE:   05/13/24 -Manual therapy: myofascial release and trigger point applied to R wrist and palm in order to reduce fascial restrictions and pain, as well as improve ROM.  -Digit composite flexion stretching 8x20 -Wrist ROM: flexion, extension, ulnar/radial deviation, supination/pronation, x10 -Digit ROM: composite flexion, abduction, finger taps, opposition, x10  05/01/24 -Wrist ROM: flexion, extension, ulnar/radial deviation, supination/pronation, x10                                                                                                                               PATIENT EDUCATION: Education details: Wrist ROM Person educated: Patient Education method: Explanation, Demonstration, and Handouts Education comprehension: verbalized understanding and returned demonstration  HOME EXERCISE PROGRAM: 12/4: wrist ROM  GOALS: Goals reviewed with patient? Yes  SHORT TERM GOALS: Target date: 06/27/24  Pt will be educated and provided HEP for RUE mobility and strength in order to complete ADL's independently.   Goal status: IN PROGRESS  2.  Pt will decreased RUE pain to 3/10 or less in order to sleep for 3+ consecutive hours without waking due to pain.   Goal status: IN PROGRESS  3.  Pt will increase RUE ROM by 10+ degrees in order to grasp and manipulate items during dressing and bathing.   Goal status: IN PROGRESS  4.  Pt will increased RUE strength to 5/5 in order to lift and carry groceries while putting them away.   Goal status: IN PROGRESS  5.  Pt will increase RUE grip by 10# and pinch by 1# in order to grasp and manipulate items during cooking and cleaning tasks.  Goal status: IN PROGRESS  6.  Pt will increase RUE coordination by completing 9 hole peg test in 28 sec or less in order to manipulate buttons, zippers, and clasps independently.   Goal status: IN PROGRESS   ASSESSMENT:  CLINICAL IMPRESSION: This session pt  presents with increased stiffness and discomfort. Due to fascial restrictions and slight edema OT provided manual therapy to decrease pain and stiffness. ROM appears WFL for the wrist, however her digits continue to be limited due to edema and stiffness. Verbal and tactile cuing provided for positioning and technique.   PERFORMANCE DEFICITS: in functional skills including ADLs, IADLs, coordination, dexterity, sensation, edema, ROM, strength, pain, fascial restrictions, muscle spasms, Fine motor control, Gross motor control, body mechanics, and UE functional use.  PLAN:  OT FREQUENCY: 2x/week  OT DURATION: 4 weeks  PLANNED INTERVENTIONS: 97168 OT Re-evaluation, 97535 self care/ADL training, 02889 therapeutic exercise, 97530 therapeutic activity, 97112 neuromuscular re-education, 97140 manual therapy, 97035 ultrasound, 97018 paraffin, 02989 moist heat, 97010 cryotherapy, 97034 contrast bath, 97032 electrical stimulation (manual), 97760 Orthotic Initial, 97763 Orthotic/Prosthetic subsequent, passive range of motion, functional mobility training, energy conservation, coping strategies training, patient/family education, and DME and/or AE instructions  RECOMMENDED OTHER SERVICES: N/A  CONSULTED AND AGREED WITH PLAN OF CARE: Patient  PLAN FOR NEXT SESSION: Manual Therapy, stretching, ROM, gripping  Valentin Nightingale, OTR/L [facility] [phone] Valentin Jillyn Nightingale, OT 05/13/2024, 12:55 PM

## 2024-05-14 ENCOUNTER — Ambulatory Visit (HOSPITAL_COMMUNITY): Admission: RE | Admit: 2024-05-14 | Discharge: 2024-05-14 | Attending: Urology

## 2024-05-14 DIAGNOSIS — C641 Malignant neoplasm of right kidney, except renal pelvis: Secondary | ICD-10-CM | POA: Diagnosis present

## 2024-05-14 DIAGNOSIS — R0609 Other forms of dyspnea: Secondary | ICD-10-CM | POA: Insufficient documentation

## 2024-05-14 LAB — POCT I-STAT CREATININE: Creatinine, Ser: 1 mg/dL (ref 0.44–1.00)

## 2024-05-14 MED ORDER — IOHEXOL 350 MG/ML SOLN
80.0000 mL | Freq: Once | INTRAVENOUS | Status: AC | PRN
  Administered 2024-05-14: 15:00:00 80 mL via INTRAVENOUS

## 2024-05-14 MED ORDER — IOHEXOL 300 MG/ML  SOLN: 100.0000 mL | Freq: Once | INTRAMUSCULAR | Status: DC | PRN

## 2024-05-15 ENCOUNTER — Ambulatory Visit (HOSPITAL_COMMUNITY): Admitting: Occupational Therapy

## 2024-05-16 ENCOUNTER — Other Ambulatory Visit

## 2024-05-16 DIAGNOSIS — C641 Malignant neoplasm of right kidney, except renal pelvis: Secondary | ICD-10-CM

## 2024-05-17 ENCOUNTER — Encounter: Payer: Self-pay | Admitting: Internal Medicine

## 2024-05-17 LAB — COMPREHENSIVE METABOLIC PANEL WITH GFR
ALT: 13 IU/L (ref 0–32)
AST: 18 IU/L (ref 0–40)
Albumin: 3.9 g/dL (ref 3.8–4.9)
Alkaline Phosphatase: 56 IU/L (ref 49–135)
BUN/Creatinine Ratio: 15 (ref 9–23)
BUN: 15 mg/dL (ref 6–24)
Bilirubin Total: 0.4 mg/dL (ref 0.0–1.2)
CO2: 22 mmol/L (ref 20–29)
Calcium: 9.1 mg/dL (ref 8.7–10.2)
Chloride: 102 mmol/L (ref 96–106)
Creatinine, Ser: 1.02 mg/dL — ABNORMAL HIGH (ref 0.57–1.00)
Globulin, Total: 2.4 g/dL (ref 1.5–4.5)
Glucose: 186 mg/dL — ABNORMAL HIGH (ref 70–99)
Potassium: 4.5 mmol/L (ref 3.5–5.2)
Sodium: 138 mmol/L (ref 134–144)
Total Protein: 6.3 g/dL (ref 6.0–8.5)
eGFR: 63 mL/min/1.73

## 2024-05-18 ENCOUNTER — Encounter: Payer: Self-pay | Admitting: Internal Medicine

## 2024-05-19 ENCOUNTER — Ambulatory Visit (HOSPITAL_COMMUNITY): Admitting: Occupational Therapy

## 2024-05-20 ENCOUNTER — Telehealth: Payer: Self-pay | Admitting: Medical Oncology

## 2024-05-20 NOTE — Telephone Encounter (Signed)
 Per Christina Cameron , staff at [facility] pt has surgery pending for  Incisional hernia?

## 2024-05-21 ENCOUNTER — Encounter: Payer: Self-pay | Admitting: Medical Oncology

## 2024-05-21 ENCOUNTER — Ambulatory Visit (HOSPITAL_COMMUNITY): Admitting: Occupational Therapy

## 2024-05-21 NOTE — Progress Notes (Unsigned)
 Surgical clearance letter signed and faxed to [facility] with faxed receipt.

## 2024-05-26 ENCOUNTER — Ambulatory Visit (HOSPITAL_COMMUNITY): Admitting: Occupational Therapy

## 2024-05-26 ENCOUNTER — Encounter: Admitting: Orthopedic Surgery

## 2024-05-26 ENCOUNTER — Encounter: Payer: Self-pay | Admitting: *Deleted

## 2024-05-26 ENCOUNTER — Encounter (HOSPITAL_COMMUNITY): Payer: Self-pay | Admitting: Occupational Therapy

## 2024-05-26 ENCOUNTER — Encounter: Payer: Self-pay | Admitting: Internal Medicine

## 2024-05-26 DIAGNOSIS — M79641 Pain in right hand: Secondary | ICD-10-CM | POA: Diagnosis not present

## 2024-05-26 DIAGNOSIS — R29898 Other symptoms and signs involving the musculoskeletal system: Secondary | ICD-10-CM

## 2024-05-26 DIAGNOSIS — M25641 Stiffness of right hand, not elsewhere classified: Secondary | ICD-10-CM

## 2024-05-26 NOTE — Progress Notes (Signed)
 Christina Cameron                                          MRN: [id number]   05/26/2024   The [facility] Quality Team Specialist reviewed this patient medical record for the purposes of chart review for care gap closure. The following were reviewed: chart review for care gap closure-glycemic status assessment and kidney health evaluation for diabetes:eGFR  and uACR.    [facility] Quality Team

## 2024-05-26 NOTE — Telephone Encounter (Signed)
Faxed medical clearance form

## 2024-05-26 NOTE — Therapy (Signed)
 " OUTPATIENT OCCUPATIONAL THERAPY ORTHO TREATMENT NOTE  Patient Name: Christina Cameron MRN: [id number] DOB:December 21, 1965, 58 y.o., female Today's Date: 05/26/2024  PCP: Dow Longs, PA-C REFERRING PROVIDER: Arlinda Buster, MD  END OF SESSION:  OT End of Session - 05/26/24 1039     Visit Number 3    Number of Visits 8    Date for Recertification  06/27/24    Authorization Type [employer] Dual Complete    Authorization Time Period No auth required    Progress Note Due on Visit 10    OT Start Time [phone]    OT Stop Time 1034    OT Time Calculation (min) 41 min    Activity Tolerance Patient tolerated treatment well    Behavior During Therapy [facility] for tasks assessed/performed          Past Medical History:  Diagnosis Date   Arm vein blood clot, unspecified laterality    around 20 years ago as of 08/10/21, Patient doesn't remember what caused it. She was not put on blood thinners.   Arthritis    right knee   Asthma    Cancer (HCC)    cervical 1989   Cancer of kidney (HCC)    Chest pain    a. normal cors by cath in 11/2014 / 03/02/2020 nuclear stress test demonstrated no perfusion defects consistent with prior infart or current ischemia. Normal study.   Chronic diastolic (congestive) heart failure (HCC)    09/27/20 Echocardiogram in Epic showed LVEF 60 to 65%.   Complication of anesthesia    post-operative nausea & vomiting   COVID-19    05/29/19 & 05/2020 Covid infections   Depression    Diabetes mellitus, type 2 (HCC)    Dysrhythmia    heart palpitations   GERD (gastroesophageal reflux disease)    H/O cardiovascular stress test 03/02/2020   03/02/2020 Nuclear stress test in Epic showed no perusion defects consistent with prior infarct or ischemia - normal study.   Headache    migraines   High cholesterol    pt takes Crestor    History of cardiac radiofrequency ablation    Hypertension    Last cardiology office visit, 10/22/20 with Prentice Medley, NP ( see note in Epic) as of 08/09/21.    Morbid (severe) obesity due to excess calories (HCC)    Neuromuscular disorder (HCC)    severe neuropathy in feet   Osteoarthritis of knee, unspecified    Palpitations    PONV (postoperative nausea and vomiting)    Post-COVID syndrome 05/2019   Pt experienced chest tightness, sob, palpitations & dizziness after Covid infection. See 04/2020 note from Prentice Medley, NP in [place].   PTSD (post-traumatic stress disorder)    Raynaud's syndrome    Sleep apnea 09/24/2020   sleep study indicated moderate sleep apea   SVT (supraventricular tachycardia)    a. s/p ablation by Dr. Waddell in 2006.   Wears glasses    prescripton reading glasses   Past Surgical History:  Procedure Laterality Date   CARDIAC CATHETERIZATION N/A 12/14/2014   Procedure: Right/Left Heart Cath and Coronary Angiography;  Surgeon: Ozell Fell, MD;  Location: [facility] INVASIVE CV LAB;  Service: Cardiovascular;  Laterality: N/A;   CARDIAC ELECTROPHYSIOLOGY STUDY AND ABLATION     around 2006, Patient states that heart rate was up to 180 bpm.   CESAREAN SECTION     x2 1990, 2005   CYSTOSCOPY  08/17/2021   Procedure: CYSTOSCOPY;  Surgeon: Sarrah Browning, MD;  Location: [facility];  Service: Gynecology;;   ENDOMETRIAL ABLATION     HYSTEROSCOPY WITH D & C N/A 03/11/2021   Procedure: DILATATION AND CURETTAGE /HYSTEROSCOPY;  Surgeon: Gretta Gums, MD;  Location: [facility] OR;  Service: Gynecology;  Laterality: N/A;   OPERATIVE ULTRASOUND N/A 03/11/2021   Procedure: OPERATIVE ULTRASOUND;  Surgeon: Gretta Gums, MD;  Location: [facility] OR;  Service: Gynecology;  Laterality: N/A;  ultrasound guidance needed   ROBOT ASSISTED LAPAROSCOPIC NEPHRECTOMY Right 12/25/2022   Procedure: XI ROBOTIC ASSISTED LAPAROSCOPIC NEPHRECTOMY;  Surgeon: Sherrilee Belvie CROME, MD;  Location: [facility];  Service: Urology;  Laterality: Right;   ROBOTIC ASSISTED LAPAROSCOPIC HYSTERECTOMY AND SALPINGECTOMY Bilateral 08/17/2021   Procedure: XI ROBOTIC ASSISTED  LAPAROSCOPIC HYSTERECTOMY AND  BILATERAL SALPINGECTOMY AND OOPHERETOMY;  Surgeon: Sarrah Browning, MD;  Location: [facility];  Service: Gynecology;  Laterality: Bilateral;   Patient Active Problem List   Diagnosis Date Noted   Encounter for antineoplastic immunotherapy 04/11/2023   Primary renal cell carcinoma of right kidney (HCC) 01/30/2023   History of right radical nephrectomy 12/28/2022   Right renal mass 12/25/2022   Raynaud's phenomenon 09/06/2022   Sensorineural hearing loss (SNHL) of both ears 09/06/2022   Tinnitus of both ears 09/06/2022   Cardiac chest pain 05/01/2022   Moderate obstructive sleep apnea 01/23/2022   Heart failure with preserved ejection fraction (HCC) 09/19/2021   Abnormal vaginal bleeding in postmenopausal patient 08/17/2021   Paroxysmal SVT (supraventricular tachycardia) 09/17/2019   Upper airway cough syndrome vs atypical asthma/ cough variant  09/17/2019   Anxiety 09/15/2019   Essential hypertension, benign 05/07/2019   Chronic diastolic (congestive) heart failure (HCC)    HLD (hyperlipidemia)    Morbid (severe) obesity due to excess calories (HCC)    Osteoarthritis of knee, unspecified    Palpitations    Somnolence    Type 2 diabetes mellitus with hyperglycemia (HCC)    Raynaud's syndrome    Hypertensive disorder 09/21/2017   Diabetes mellitus (HCC) 09/21/2017   DOE (dyspnea on exertion)    Precordial chest pain 09/08/2014    ONSET DATE: 04/10/24  REFERRING DIAG: G56.01 (ICD-10-CM) - Carpal tunnel syndrome, right upper limb   THERAPY DIAG:  Pain in right hand  Stiffness of right hand, not elsewhere classified  Other symptoms and signs involving the musculoskeletal system  Rationale for Evaluation and Treatment: Rehabilitation  SUBJECTIVE:   SUBJECTIVE STATEMENT: It may be a bit better Pt accompanied by: self  PERTINENT HISTORY: status post right wrist endoscopic carpal tunnel release. PMH significant for Kidney  cancer with active treatment.   PRECAUTIONS: None  WEIGHT BEARING RESTRICTIONS: No  PAIN:  Are you having pain? No  FALLS: Has patient fallen in last 6 months? No  PLOF: Independent  PATIENT GOALS: To be able to grip  NEXT MD VISIT: 05/26/24  OBJECTIVE:  Note: Objective measures were completed at Evaluation unless otherwise noted.  HAND DOMINANCE: Right  ADLs: Overall ADLs: Pt unable to manipulate buttons, zippers, clasps. She also reports inability to grasp and hold objects or pick up small objects. She is limited in dressing and bathing. Pt can only use her L hand for cooking and cleaning because she can't grip anything with her R and drops everything.   UPPER EXTREMITY ROM:     Active ROM Right eval  Wrist flexion 42  Wrist extension 48  Wrist ulnar deviation 42  Wrist radial deviation 28  Wrist pronation 90  Wrist supination 80  (Blank rows = not tested)  Active  ROM Right eval  Thumb MCP (0-60) 45  Thumb IP (0-80) 25  Thumb Opposition to Small Finger able  Index MCP (0-90) 60  Index PIP (0-100) 80  Index DIP (0-70)  50  Long MCP (0-90)  65  Long PIP (0-100)  65  Long DIP (0-70)  40  Ring MCP (0-90)  55  Ring PIP (0-100) 75   Ring DIP (0-70)  50  Little MCP (0-90)  70  Little PIP (0-100) 80   Little DIP (0-70)  65  (Blank rows = not tested)   UPPER EXTREMITY MMT:     MMT Right eval  Wrist flexion 4/5  Wrist extension 4+/5  Wrist ulnar deviation 4/5  Wrist radial deviation 4+/5  Wrist pronation 4+/5  Wrist supination 4+/5  (Blank rows = not tested)  HAND FUNCTION: Grip strength: Right: 27 lbs; Left: 46 lbs, Lateral pinch: Right: 10 lbs, Left: 10 lbs, and 3 point pinch: Right: 7 lbs, Left: 7 lbs  COORDINATION: 9 Hole Peg test: Right: 31.86 sec; Left: 1 min 5 sec  SENSATION: Numbness in D1-D3 finger tips  EDEMA: R Wrist: 19.1 cm L Wrist: 18.4 cm                R MCP: 21.0 cm L: MCP: 20.8 cm  OBSERVATIONS: tremors with increased  effort   TREATMENT DATE:   05/26/24 -Paraffin Bath: 10' w/ moist heat -Digit ROM: composite flexion, abduction, finger taps, opposition, x10 -Therabar: yellow, extension/flexion twists, supination bends, pronated bends, x10 -pinch tree: yellow, red, and green resistance clips, tripod up, lateral down, x6 each -digiflex: 3#, full squeeze x10, each digit x6 -Wrist ROM: flexion, extension, ulnar/radial deviation, supination/pronation, x10  05/13/24 -Manual therapy: myofascial release and trigger point applied to R wrist and palm in order to reduce fascial restrictions and pain, as well as improve ROM.  -Digit composite flexion stretching 8x20 -Wrist ROM: flexion, extension, ulnar/radial deviation, supination/pronation, x10 -Digit ROM: composite flexion, abduction, finger taps, opposition, x10  05/01/24 -Wrist ROM: flexion, extension, ulnar/radial deviation, supination/pronation, x10                                                                                                                               PATIENT EDUCATION: Education details: Wrist ROM Person educated: Patient Education method: Explanation, Demonstration, and Handouts Education comprehension: verbalized understanding and returned demonstration  HOME EXERCISE PROGRAM: 12/4: wrist ROM  GOALS: Goals reviewed with patient? Yes  SHORT TERM GOALS: Target date: 06/27/24  Pt will be educated and provided HEP for RUE mobility and strength in order to complete ADL's independently.   Goal status: IN PROGRESS  2.  Pt will decreased RUE pain to 3/10 or less in order to sleep for 3+ consecutive hours without waking due to pain.   Goal status: IN PROGRESS  3.  Pt will increase RUE ROM by 10+ degrees in order to grasp and manipulate items during dressing and bathing.  Goal status: IN PROGRESS  4.  Pt will increased RUE strength to 5/5 in order to lift and carry groceries while putting them away.   Goal status: IN  PROGRESS  5.  Pt will increase RUE grip by 10# and pinch by 1# in order to grasp and manipulate items during cooking and cleaning tasks.   Goal status: IN PROGRESS  6.  Pt will increase RUE coordination by completing 9 hole peg test in 28 sec or less in order to manipulate buttons, zippers, and clasps independently.   Goal status: IN PROGRESS   ASSESSMENT:  CLINICAL IMPRESSION: Pt continues to have increased stiffness, which OT addressed with paraffin bath. Pt reports improved mobility in her digits, however swelling was present in all fingers after paraffin bath. Strength is improving and pt was able to tolerate pinch strengthening this session with only mild sensation of pulling through her wrist with increased resistance. Verbal and tactile cuing provided for positioning and technique throughout session.    PERFORMANCE DEFICITS: in functional skills including ADLs, IADLs, coordination, dexterity, sensation, edema, ROM, strength, pain, fascial restrictions, muscle spasms, Fine motor control, Gross motor control, body mechanics, and UE functional use.  PLAN:  OT FREQUENCY: 2x/week  OT DURATION: 4 weeks  PLANNED INTERVENTIONS: 97168 OT Re-evaluation, 97535 self care/ADL training, 02889 therapeutic exercise, 97530 therapeutic activity, 97112 neuromuscular re-education, 97140 manual therapy, 97035 ultrasound, 97018 paraffin, 02989 moist heat, 97010 cryotherapy, 97034 contrast bath, 97032 electrical stimulation (manual), 97760 Orthotic Initial, 97763 Orthotic/Prosthetic subsequent, passive range of motion, functional mobility training, energy conservation, coping strategies training, patient/family education, and DME and/or AE instructions  RECOMMENDED OTHER SERVICES: N/A  CONSULTED AND AGREED WITH PLAN OF CARE: Patient  PLAN FOR NEXT SESSION: Manual Therapy, stretching, ROM, gripping  Valentin Nightingale, OTR/L [facility] [phone] Valentin Jillyn Nightingale, OT 05/26/2024,  10:41 AM   "

## 2024-05-28 ENCOUNTER — Ambulatory Visit (HOSPITAL_COMMUNITY): Admitting: Occupational Therapy

## 2024-05-30 ENCOUNTER — Ambulatory Visit: Admitting: Urology

## 2024-05-31 ENCOUNTER — Telehealth: Admitting: Family Medicine

## 2024-05-31 DIAGNOSIS — R42 Dizziness and giddiness: Secondary | ICD-10-CM

## 2024-05-31 MED ORDER — MECLIZINE HCL 25 MG PO TABS: 25.0000 mg | ORAL_TABLET | Freq: Three times a day (TID) | ORAL | 0 refills | Status: AC | PRN

## 2024-05-31 NOTE — Progress Notes (Signed)
 E Visit for Motion Sickness  We are sorry that you are not feeling well. Here is how we plan to help!  Based on what you have shared with me it looks like you have symptoms of motion sickness.  I have prescribed a medication that will help prevent or alleviate your symptoms:  Meclizine  25mg  by mouth three times per day as needed for nausea/motion sickness   Prevention:  You might feel better if you keep your eyes focused on outside while you are in motion. For example, if you are in a car, sit in the front and look in the direction you are moving; if you are on a boat, stay on the deck and look to the horizon. This helps make what you see match the movement you are feeling, and so you are less likely to feel sick.  You should also avoid reading, watching a movie, texting or reading messages, or looking at things close to you inside the vehicle you are riding in.  Use the seat head rest. Lean your head against the back of the seat or head rest when traveling in vehicles with seats to minimize head movements.  On a ship: When making your reservations, choose a cabin in the middle of the ship and near the waterline. When on board, go up on deck and focus on the horizon.  In an airplane: Request a window seat and look out the window. A seat over the front edge of the wing is the most preferable spot (the degree of motion is the lowest here). Direct the air vent to blow cool air on your face.  On a train: Always face forward and sit near a window.  In a vehicle: Sit in the front seat; if you are the passenger, look at the scenery in the distance. For some people, driving the vehicle (rather than being a passenger) is an instant remedy.  Avoid others who have become nauseous with motion sickness. Seeing and smelling others who have motion sickness may cause you to become sick.  GET HELP RIGHT AWAY IF:  Your symptoms do not improve or worsen within 2 days after treatment.  You cannot keep  down fluids after trying the medication.  Other associated symptoms such as severe headache, visual field changes, fever, or intractable nausea and vomiting.  MAKE SURE YOU:  Understand these instructions. Will watch your condition. Will get help right away if you are not doing well or get worse.  Thank you for choosing an e-visit.  Your e-visit answers were reviewed by a board certified advanced clinical practitioner to complete your personal care plan. Depending upon the condition, your plan could have included both over the counter or prescription medications.  Please review your pharmacy choice. Be sure that the pharmacy you have chosen is open so that you can pick up your prescription now.  If there is a problem you may message your provider in MyChart to have the prescription routed to another pharmacy.  Your safety is important to us . If you have drug allergies check your prescription carefully.   For the next 24 hours, you can use MyChart to ask questions about today's visit, request a non-urgent call back, or ask for a work or school excuse from your e-visit provider.  You will get an e-mail in the next two days asking about your experience. I hope that your e-visit has been valuable and will speed your recovery.   References or for more information: [email or web address].com/ https://my.https://rowe.info/ https://www.uptodate.com  I have spent 5 minutes in review of e-visit questionnaire, review and updating patient chart, medical decision making and response to patient.   Eldoris Beiser, FNP

## 2024-06-03 ENCOUNTER — Ambulatory Visit (HOSPITAL_COMMUNITY): Admitting: Occupational Therapy

## 2024-06-05 ENCOUNTER — Ambulatory Visit (HOSPITAL_COMMUNITY): Attending: Family Medicine | Admitting: Occupational Therapy

## 2024-06-05 ENCOUNTER — Encounter (HOSPITAL_COMMUNITY): Payer: Self-pay | Admitting: Occupational Therapy

## 2024-06-05 DIAGNOSIS — M79641 Pain in right hand: Secondary | ICD-10-CM | POA: Insufficient documentation

## 2024-06-05 DIAGNOSIS — R29898 Other symptoms and signs involving the musculoskeletal system: Secondary | ICD-10-CM | POA: Diagnosis present

## 2024-06-05 DIAGNOSIS — M25641 Stiffness of right hand, not elsewhere classified: Secondary | ICD-10-CM | POA: Insufficient documentation

## 2024-06-05 NOTE — Therapy (Signed)
 " OUTPATIENT OCCUPATIONAL THERAPY ORTHO TREATMENT NOTE  Patient Name: Christina Cameron MRN: [id number] DOB:[birth year], 59 y.o., female Today's Date: 06/05/2024  PCP: Dow Longs, PA-C REFERRING PROVIDER: Arlinda Buster, MD  END OF SESSION:  OT End of Session - 06/05/24 1154     Visit Number 4    Number of Visits 8    Date for Recertification  06/27/24    Authorization Type [employer] Dual Complete    Authorization Time Period No auth required    Progress Note Due on Visit 10    OT Start Time 1117    OT Stop Time 1155    OT Time Calculation (min) 38 min    Activity Tolerance Patient tolerated treatment well    Behavior During Therapy WFL for tasks assessed/performed           Past Medical History:  Diagnosis Date   Arm vein blood clot, unspecified laterality    around 20 years ago as of 08/10/21, Patient doesn't remember what caused it. She was not put on blood thinners.   Arthritis    right knee   Asthma    Cancer (HCC)    cervical 1989   Cancer of kidney (HCC)    Chest pain    a. normal cors by cath in 11/2014 / 03/02/2020 nuclear stress test demonstrated no perfusion defects consistent with prior infart or current ischemia. Normal study.   Chronic diastolic (congestive) heart failure (HCC)    09/27/20 Echocardiogram in Epic showed LVEF 60 to 65%.   Complication of anesthesia    post-operative nausea & vomiting   COVID-19    05/29/19 & 05/2020 Covid infections   Depression    Diabetes mellitus, type 2 (HCC)    Dysrhythmia    heart palpitations   GERD (gastroesophageal reflux disease)    H/O cardiovascular stress test 03/02/2020   03/02/2020 Nuclear stress test in Epic showed no perusion defects consistent with prior infarct or ischemia - normal study.   Headache    migraines   High cholesterol    pt takes Crestor    History of cardiac radiofrequency ablation    Hypertension    Last cardiology office visit, 10/22/20 with Prentice Medley, NP ( see note in Epic) as of 08/09/21.    Morbid (severe) obesity due to excess calories (HCC)    Neuromuscular disorder (HCC)    severe neuropathy in feet   Osteoarthritis of knee, unspecified    Palpitations    PONV (postoperative nausea and vomiting)    Post-COVID syndrome 05/2019   Pt experienced chest tightness, sob, palpitations & dizziness after Covid infection. See 04/2020 note from Prentice Medley, NP in [place].   PTSD (post-traumatic stress disorder)    Raynaud's syndrome    Sleep apnea 09/24/2020   sleep study indicated moderate sleep apea   SVT (supraventricular tachycardia)    a. s/p ablation by Dr. Waddell in 2006.   Wears glasses    prescripton reading glasses   Past Surgical History:  Procedure Laterality Date   CARDIAC CATHETERIZATION N/A 12/14/2014   Procedure: Right/Left Heart Cath and Coronary Angiography;  Surgeon: Ozell Fell, MD;  Location: [facility] INVASIVE CV LAB;  Service: Cardiovascular;  Laterality: N/A;   CARDIAC ELECTROPHYSIOLOGY STUDY AND ABLATION     around 2006, Patient states that heart rate was up to 180 bpm.   CESAREAN SECTION     x2 1990, 2005   CYSTOSCOPY  08/17/2021   Procedure: CYSTOSCOPY;  Surgeon: Sarrah Browning, MD;  Location: [facility];  Service: Gynecology;;   ENDOMETRIAL ABLATION     HYSTEROSCOPY WITH D & C N/A 03/11/2021   Procedure: DILATATION AND CURETTAGE /HYSTEROSCOPY;  Surgeon: Gretta Gums, MD;  Location: [facility] OR;  Service: Gynecology;  Laterality: N/A;   OPERATIVE ULTRASOUND N/A 03/11/2021   Procedure: OPERATIVE ULTRASOUND;  Surgeon: Gretta Gums, MD;  Location: [facility] OR;  Service: Gynecology;  Laterality: N/A;  ultrasound guidance needed   ROBOT ASSISTED LAPAROSCOPIC NEPHRECTOMY Right 12/25/2022   Procedure: XI ROBOTIC ASSISTED LAPAROSCOPIC NEPHRECTOMY;  Surgeon: Sherrilee Belvie CROME, MD;  Location: [facility];  Service: Urology;  Laterality: Right;   ROBOTIC ASSISTED LAPAROSCOPIC HYSTERECTOMY AND SALPINGECTOMY Bilateral 08/17/2021   Procedure: XI ROBOTIC ASSISTED  LAPAROSCOPIC HYSTERECTOMY AND  BILATERAL SALPINGECTOMY AND OOPHERETOMY;  Surgeon: Sarrah Browning, MD;  Location: [facility];  Service: Gynecology;  Laterality: Bilateral;   Patient Active Problem List   Diagnosis Date Noted   Encounter for antineoplastic immunotherapy 04/11/2023   Primary renal cell carcinoma of right kidney (HCC) 01/30/2023   History of right radical nephrectomy 12/28/2022   Right renal mass 12/25/2022   Raynaud's phenomenon 09/06/2022   Sensorineural hearing loss (SNHL) of both ears 09/06/2022   Tinnitus of both ears 09/06/2022   Cardiac chest pain 05/01/2022   Moderate obstructive sleep apnea 01/23/2022   Heart failure with preserved ejection fraction (HCC) 09/19/2021   Abnormal vaginal bleeding in postmenopausal patient 08/17/2021   Paroxysmal SVT (supraventricular tachycardia) 09/17/2019   Upper airway cough syndrome vs atypical asthma/ cough variant  09/17/2019   Anxiety 09/15/2019   Essential hypertension, benign 05/07/2019   Chronic diastolic (congestive) heart failure (HCC)    HLD (hyperlipidemia)    Morbid (severe) obesity due to excess calories (HCC)    Osteoarthritis of knee, unspecified    Palpitations    Somnolence    Type 2 diabetes mellitus with hyperglycemia (HCC)    Raynaud's syndrome    Hypertensive disorder 09/21/2017   Diabetes mellitus (HCC) 09/21/2017   DOE (dyspnea on exertion)    Precordial chest pain 09/08/2014    ONSET DATE: 04/10/24  REFERRING DIAG: G56.01 (ICD-10-CM) - Carpal tunnel syndrome, right upper limb   THERAPY DIAG:  Pain in right hand  Stiffness of right hand, not elsewhere classified  Other symptoms and signs involving the musculoskeletal system  Rationale for Evaluation and Treatment: Rehabilitation  SUBJECTIVE:   SUBJECTIVE STATEMENT: S: It's ok, it swollen today  PERTINENT HISTORY: status post right wrist endoscopic carpal tunnel release. PMH significant for Kidney cancer with active  treatment.   PRECAUTIONS: None  WEIGHT BEARING RESTRICTIONS: No  PAIN:  Are you having pain? Yes: NPRS scale: 4/10 Pain location: right digits Pain description: uncomfortable Aggravating factors: swelling Relieving factors: fluid pills  FALLS: Has patient fallen in last 6 months? No  PLOF: Independent  PATIENT GOALS: To be able to grip  NEXT MD VISIT: 06/2024  OBJECTIVE:  Note: Objective measures were completed at Evaluation unless otherwise noted.  HAND DOMINANCE: Right  ADLs: Overall ADLs: Pt unable to manipulate buttons, zippers, clasps. She also reports inability to grasp and hold objects or pick up small objects. She is limited in dressing and bathing. Pt can only use her L hand for cooking and cleaning because she can't grip anything with her R and drops everything.   UPPER EXTREMITY ROM:     Active ROM Right eval  Wrist flexion 42  Wrist extension 48  Wrist ulnar deviation 42  Wrist radial deviation 28  Wrist  pronation 90  Wrist supination 80  (Blank rows = not tested)  Active ROM Right eval  Thumb MCP (0-60) 45  Thumb IP (0-80) 25  Thumb Opposition to Small Finger able  Index MCP (0-90) 60  Index PIP (0-100) 80  Index DIP (0-70)  50  Long MCP (0-90)  65  Long PIP (0-100)  65  Long DIP (0-70)  40  Ring MCP (0-90)  55  Ring PIP (0-100) 75   Ring DIP (0-70)  50  Little MCP (0-90)  70  Little PIP (0-100) 80   Little DIP (0-70)  65  (Blank rows = not tested)   UPPER EXTREMITY MMT:     MMT Right eval  Wrist flexion 4/5  Wrist extension 4+/5  Wrist ulnar deviation 4/5  Wrist radial deviation 4+/5  Wrist pronation 4+/5  Wrist supination 4+/5  (Blank rows = not tested)  HAND FUNCTION: Grip strength: Right: 27 lbs; Left: 46 lbs, Lateral pinch: Right: 10 lbs, Left: 10 lbs, and 3 point pinch: Right: 7 lbs, Left: 7 lbs  COORDINATION: 9 Hole Peg test: Right: 31.86 sec; Left: 1 min 5 sec  SENSATION: Numbness in D1-D3 finger tips  EDEMA: R  Wrist: 19.1 cm L Wrist: 18.4 cm                R MCP: 21.0 cm L: MCP: 20.8 cm  OBSERVATIONS: tremors with increased effort   TREATMENT DATE:  06/05/24 -Manual therapy: retrograde massage to right digits, hand, and wrist to decrease edema and improve mobility required for functional use.  -Wrist ROM: flexion, extension, ulnar/radial deviation, supination/pronation, 15 reps -Sponges: 10, 11, 12 -Hand gripper: large and medium beads at 25# with gripper vertical -Coin manipulation: pt holding coins in palm, translating to fingertips, dropping in slotted container. 3 rounds, did not drop any coins -Pt using green clothespin and 3 point pinch to grasp and stack 4 towers of 5 sponges, using lateral pinch to remove and replace into bucket.  -Theraputty: red-flatten, cutting circles with pvc, gripping-pronated  05/26/24 -Paraffin Bath: 10' w/ moist heat -Digit ROM: composite flexion, abduction, finger taps, opposition, x10 -Therabar: yellow, extension/flexion twists, supination bends, pronated bends, x10 -pinch tree: yellow, red, and green resistance clips, tripod up, lateral down, x6 each -digiflex: 3#, full squeeze x10, each digit x6 -Wrist ROM: flexion, extension, ulnar/radial deviation, supination/pronation, x10  05/13/24 -Manual therapy: myofascial release and trigger point applied to R wrist and palm in order to reduce fascial restrictions and pain, as well as improve ROM.  -Digit composite flexion stretching 8x20 -Wrist ROM: flexion, extension, ulnar/radial deviation, supination/pronation, x10 -Digit ROM: composite flexion, abduction, finger taps, opposition, x10   PATIENT EDUCATION: Education details: Reviewed HEP Person educated: Patient Education method: Explanation, Demonstration, and Handouts Education comprehension: verbalized understanding and returned demonstration  HOME EXERCISE PROGRAM: 12/4: wrist ROM    GOALS: Goals reviewed with patient? Yes  SHORT TERM GOALS:  Target date: 06/27/24  Pt will be educated and provided HEP for RUE mobility and strength in order to complete ADL's independently.   Goal status: IN PROGRESS  2.  Pt will decreased RUE pain to 3/10 or less in order to sleep for 3+ consecutive hours without waking due to pain.   Goal status: IN PROGRESS  3.  Pt will increase RUE ROM by 10+ degrees in order to grasp and manipulate items during dressing and bathing.   Goal status: IN PROGRESS  4.  Pt will increased RUE strength to 5/5 in  order to lift and carry groceries while putting them away.   Goal status: IN PROGRESS  5.  Pt will increase RUE grip by 10# and pinch by 1# in order to grasp and manipulate items during cooking and cleaning tasks.   Goal status: IN PROGRESS  6.  Pt will increase RUE coordination by completing 9 hole peg test in 28 sec or less in order to manipulate buttons, zippers, and clasps independently.   Goal status: IN PROGRESS   ASSESSMENT:  CLINICAL IMPRESSION: Pt reports feeling that her digits are swollen and stiff, also reports she has Raynaud's which can also result in edema. Retrograde massage to digits, hand, and wrist to improve mobility. Pt with good mobility during A/ROM, added grip and pinch strengthening today. Coordination tasks completed to further mobilize fluid and improve digit mobility. Added theraputty work as well. Pt reports no increased pain at end of session. Verbal cuing for form and technique during session tasks.    PERFORMANCE DEFICITS: in functional skills including ADLs, IADLs, coordination, dexterity, sensation, edema, ROM, strength, pain, fascial restrictions, muscle spasms, Fine motor control, Gross motor control, body mechanics, and UE functional use.  PLAN:  OT FREQUENCY: 2x/week  OT DURATION: 4 weeks  PLANNED INTERVENTIONS: 97168 OT Re-evaluation, 97535 self care/ADL training, 02889 therapeutic exercise, 97530 therapeutic activity, 97112 neuromuscular re-education,  97140 manual therapy, 97035 ultrasound, 97018 paraffin, 02989 moist heat, 97010 cryotherapy, 97034 contrast bath, 97032 electrical stimulation (manual), 97760 Orthotic Initial, 97763 Orthotic/Prosthetic subsequent, passive range of motion, functional mobility training, energy conservation, coping strategies training, patient/family education, and DME and/or AE instructions  RECOMMENDED OTHER SERVICES: N/A  CONSULTED AND AGREED WITH PLAN OF CARE: Patient  PLAN FOR NEXT SESSION: Manual Therapy, stretching, ROM, gripping    Sonny Cory, OTR/L  [phone] 06/05/2024, 12:08 PM   "

## 2024-06-08 ENCOUNTER — Telehealth: Admitting: Physician Assistant

## 2024-06-08 DIAGNOSIS — R3989 Other symptoms and signs involving the genitourinary system: Secondary | ICD-10-CM

## 2024-06-08 MED ORDER — CEPHALEXIN 500 MG PO CAPS: 500.0000 mg | ORAL_CAPSULE | Freq: Two times a day (BID) | ORAL | 0 refills | Status: AC

## 2024-06-08 NOTE — Progress Notes (Signed)

## 2024-06-09 ENCOUNTER — Encounter: Admitting: Orthopedic Surgery

## 2024-06-09 ENCOUNTER — Ambulatory Visit: Payer: Self-pay | Admitting: Surgery

## 2024-06-10 ENCOUNTER — Ambulatory Visit (HOSPITAL_COMMUNITY): Admitting: Occupational Therapy

## 2024-06-11 ENCOUNTER — Telehealth (HOSPITAL_BASED_OUTPATIENT_CLINIC_OR_DEPARTMENT_OTHER): Payer: Self-pay

## 2024-06-11 NOTE — Telephone Encounter (Signed)
 She is already scheduled for an office visit with Laymon Qua 06/17/24. Appointment notes have been updated with need for preoperative clearance.

## 2024-06-11 NOTE — Telephone Encounter (Signed)
"  ° °  Pre-operative Risk Assessment    Patient Name: Christina Cameron  DOB: Apr 24, 1966 MRN: [id number]   Date of last office visit: 11/08/22 with Mallipeddi  Date of next office visit: 06/17/24 with Johnson (note in appt notes needing pre op clearance)   Request for Surgical Clearance    Procedure:  ventral hernia surgery  Date of Surgery:  Clearance TBD                                 Surgeon:  Dr. Lyndel Surgeon's Group or Practice Name:  [facility] Phone number:  [phone] Fax number:  [phone]   Type of Clearance Requested:   - Medical    Type of Anesthesia:  General    Additional requests/questions:    SignedAugustin JONETTA Daring   06/11/2024, 8:21 AM   "

## 2024-06-15 NOTE — Progress Notes (Unsigned)
 "  Cardiology Office Note    Date:  06/17/2024  ID:  [name], Pyka Sep 02, 1965, MRN [id number] Cardiologist: Diannah SHAUNNA Maywood, MD { : History of Present Illness:    Christina Cameron is a 59 y.o. female with past medical history of atypical chest pain (cath in 2016 showing normal cors, Coronary CTA in 2022 with coronary calcium  score of 0 and no evidence of CAD, low-risk Cardiac PET in 01/2023), chronic HFpEF, HTN, HLD, Type 2 DM and renal cell carcinoma (s/p nephrectomy in 11/2022) who presents to the office today for evaluation of chest tightness, elevated blood pressure, swelling and preoperative cardiac clearance for hernia repair by review of notes.  She was last examined by Dr. Maywood in 10/2022 and reported having worsening chest pain over the past several weeks which was occurring on a daily basis. Cardiac workup had overall been reassuring but it was recommended to obtain a cardiac PET to rule out coronary microvascular dysfunction. Was also recommended to obtain a 2-week event monitor given reports of chest pain. Her cardiac PET showed no evidence of ischemia and she had normal myocardial blood flow. This study was overall low risk. She wore her monitor for less than 1 day but this showed predominantly normal sinus rhythm with rare PVC's with less than 1% burden.  The office received a cardiac clearance request earlier this month for upcoming hernia repair surgery and it was recommended to address this at the time of her office visit.  In talking with the patient today, she reports having intermittent palpitations which typically last for a few minutes and spontaneously resolve. Episodes can occur a few times per week. No persistent symptoms. She does report occasional dizziness but this does not always correlate with her palpitations and she has been started on Meclizine  by her PCP with improvement in symptoms at times. She was previously on Lopressor  100 mg twice daily but reports being  off the medication for over a year. Says this previously helped with her palpitations. She has lost over 30 pounds in the past several months in the setting of making dietary changes and being on Mounjaro. Her activity is limited given her large hernia but she denies any exertional chest pain.  Does have occasional episodes of pain at rest. No specific orthopnea, PND or pitting edema.  Takes Lasix  as needed.  Studies Reviewed:   EKG: EKG is ordered today and demonstrates:   EKG Interpretation Date/Time:  Tuesday June 17 2024 13:27:36 EST Ventricular Rate:  80 PR Interval:  174 QRS Duration:  80 QT Interval:  386 QTC Calculation: 445 R Axis:   130  Text Interpretation: Normal sinus rhythm Right axis deviation Confirmed by Johnson Grate ([id number]) on 06/17/2024 1:32:07 PM       Echocardiogram: 09/2020 IMPRESSIONS     1. Left ventricular ejection fraction, by estimation, is 60 to 65%. The  left ventricle has normal function. The left ventricle has no regional  wall motion abnormalities. There is mild left ventricular hypertrophy.  Left ventricular diastolic parameters  were normal. The average left ventricular global longitudinal strain is  18.1 %. The global longitudinal strain is normal.   2. Right ventricular systolic function is normal. The right ventricular  size is normal.   3. The mitral valve is normal in structure. No evidence of mitral valve  regurgitation. No evidence of mitral stenosis.   4. The aortic valve has an indeterminant number of cusps. There is mild  calcification of the aortic  valve. There is mild thickening of the aortic  valve. Aortic valve regurgitation is not visualized. No aortic stenosis is  present.   5. The inferior vena cava is normal in size with greater than 50%  respiratory variability, suggesting right atrial pressure of 3 mmHg.   Comparison(s): Echocardiogram done 07/21/19 showed an EF of 60-65%.   Event Monitor: 02/2023 Patch Wear Time:   0 days and 3 hours (2024-08-31T23:35:53-0400 to 2024-09-01T02:36:37-0400)   Patient had a min HR of 70 bpm, max HR of 103 bpm, and avg HR of 78 bpm. Predominant underlying rhythm was Sinus Rhythm. Isolated SVEs were rare (<1.0%), and no SVE Couplets or SVE Triplets were present. No Isolated VEs, VE Couplets, or VE Triplets were  present.   Coronary CTA: 10/2021 IMPRESSION: 1. No evidence of CAD, CADRADS = 0.   2. Coronary calcium  score of 0. This was 0 percentile for age and sex matched control.   3. Normal coronary origin with right dominance.   4. Small PFO with very small left to right shunt.   5. Trivial aortic atherosclerosis.  Event Monitor: 02/2023 Patch Wear Time:  0 days and 3 hours (2024-08-31T23:35:53-0400 to 2024-09-01T02:36:37-0400)   Patient had a min HR of 70 bpm, max HR of 103 bpm, and avg HR of 78 bpm. Predominant underlying rhythm was Sinus Rhythm. Isolated SVEs were rare (<1.0%), and no SVE Couplets or SVE Triplets were present. No Isolated VEs, VE Couplets, or VE Triplets were  present.   Cardiac PET: 01/2023   LV perfusion is normal. There is no evidence of ischemia. There is no evidence of infarction.   Rest left ventricular function is normal. Rest EF: 60%. Stress left ventricular function is normal. Stress EF: 70%. End diastolic cavity size is normal. End systolic cavity size is normal.   Myocardial blood flow was computed to be 1.19ml/g/min at rest and 2.63ml/g/min at stress. Global myocardial blood flow reserve was 2.44 and was normal.   Coronary calcium  was absent on the attenuation correction CT images.   The study is normal. The study is low risk.   Electronically signed by Lonni Nanas, MD   Physical Exam:   VS:  BP 116/80 (BP Location: Left Arm, Cuff Size: Large)   Pulse 77   Ht 5' 4 (1.626 m)   Wt 244 lb 12.8 oz (111 kg)   SpO2 100%   BMI 42.02 kg/m    Wt Readings from Last 3 Encounters:  06/17/24 244 lb 12.8 oz (111 kg)  03/11/24  257 lb (116.6 kg)  02/12/24 259 lb 11.2 oz (117.8 kg)     GEN: Well nourished, well developed female appearing in no acute distress NECK: No JVD; No carotid bruits CARDIAC: RRR, no murmurs, rubs, gallops RESPIRATORY:  Clear to auscultation without rales, wheezing or rhonchi  ABDOMEN: Appears non-distended. No obvious abdominal masses. EXTREMITIES: No clubbing or cyanosis. No pitting edema.  Distal pedal pulses are 2+ bilaterally.   Assessment and Plan:   1. Atypical chest pain - She has a longstanding history of chest pain and cardiac workup has been reassuring with cardiac catheterization in 2016 showing normal coronary arteries, Coronary CTA in 2022 with calcium  score of 0 and no evidence of CAD and she did have a low-risk cardiac PET in 01/2023 with normal myocardial blood flow. EKG is without acute ST changes. She does not require further cardiac testing at this time. Would continue with risk factor modification.  - She is on Crestor  40 mg daily  and Zetia  10 mg daily for HLD which is followed by her PCP.   2. Palpitations - She reports occasional palpitations a few days per week as discussed above and prior monitor showed PVC's. In NSR by examination and EKG today. By her description, her current symptoms sound most consistent with PAC's or PVC's. Reviewed options with the patient and will restart Lopressor  but at a lower dose of 25 mg twice daily. I encouraged her to make us  aware of how her symptoms are doing in several weeks as this may need to be further titrated (previously on 100 mg twice daily but she did lose weight in the interim). Can also consider a repeat monitor if symptoms do not improve.  3. Chronic heart failure with preserved ejection fraction (HFpEF) (HCC) - Most recent echocardiogram in 09/2020 showed a preserved EF of 60 to 65% with mild LVH and normal diastolic parameters. Previously had grade 1 DD prior echocardiogram imaging. She does take Lasix  20 mg as needed but  has not used this recently. No indication for SGLT2i at this time.   4. Essential hypertension, benign - Her blood pressure is well-controlled at 116/80 during today's visit but she does report this has been elevated at times when checked at home. Will plan to start Lopressor  25 mg twice daily given palpitations.   5. Preoperative cardiovascular examination - As discussed above, she has undergone multiple cardiac evaluations over the past several years and most recent was a low-risk cardiac PET in 01/2023. Her RCRI risk is overall low at 1.1% risk of a major cardiac event and she does not require further testing prior to surgery. She is on Mounjaro and did review with the patient that this need to be held for 1 week prior to surgery. Will route today's note to the requesting provider.    Signed, Laymon CHRISTELLA Qua, PA-C   "

## 2024-06-17 ENCOUNTER — Ambulatory Visit: Attending: Student | Admitting: Student

## 2024-06-17 ENCOUNTER — Encounter: Payer: Self-pay | Admitting: Student

## 2024-06-17 ENCOUNTER — Ambulatory Visit: Payer: Self-pay | Admitting: Urology

## 2024-06-17 VITALS — BP 116/80 | HR 77 | Ht 64.0 in | Wt 244.8 lb

## 2024-06-17 DIAGNOSIS — I5032 Chronic diastolic (congestive) heart failure: Secondary | ICD-10-CM | POA: Diagnosis not present

## 2024-06-17 DIAGNOSIS — R002 Palpitations: Secondary | ICD-10-CM | POA: Diagnosis not present

## 2024-06-17 DIAGNOSIS — I10 Essential (primary) hypertension: Secondary | ICD-10-CM | POA: Diagnosis not present

## 2024-06-17 DIAGNOSIS — R0789 Other chest pain: Secondary | ICD-10-CM | POA: Diagnosis not present

## 2024-06-17 DIAGNOSIS — Z01810 Encounter for preprocedural cardiovascular examination: Secondary | ICD-10-CM | POA: Diagnosis not present

## 2024-06-17 MED ORDER — METOPROLOL TARTRATE 25 MG PO TABS: 25.0000 mg | ORAL_TABLET | Freq: Two times a day (BID) | ORAL | 3 refills | Status: AC

## 2024-06-17 NOTE — Patient Instructions (Signed)
 Medication Instructions:  Your physician has recommended you make the following change in your medication:   -Increase Metoprolol  to 25 mg twice daily   *If you need a refill on your cardiac medications before your next appointment, please call your pharmacy*  Lab Work: None If you have labs (blood work) drawn today and your tests are completely normal, you will receive your results only by: MyChart Message (if you have MyChart) OR A paper copy in the mail If you have any lab test that is abnormal or we need to change your treatment, we will call you to review the results.  Testing/Procedures: None  Follow-Up: At [facility], you and your health needs are our priority.  As part of our continuing mission to provide you with exceptional heart care, our providers are all part of one team.  This team includes your primary Cardiologist (physician) and Advanced Practice Providers or APPs (Physician Assistants and Nurse Practitioners) who all work together to provide you with the care you need, when you need it.  Your next appointment:   6 month(s)  Provider:   You may see Vishnu P Mallipeddi, MD or one of the following Advanced Practice Providers on your designated Care Team:   Brittany Strader, PA-C  [place], [place] Olivia [place], [place]     We recommend signing up for the patient portal called MyChart.  Sign up information is provided on this After Visit Summary.  MyChart is used to connect with patients for Virtual Visits (Telemedicine).  Patients are able to view lab/test results, encounter notes, upcoming appointments, etc.  Non-urgent messages can be sent to your provider as well.   To learn more about what you can do with MyChart, go to [email or web address].   Other Instructions

## 2024-06-17 NOTE — Progress Notes (Signed)
 Anesthesia Review:  PCP: Diane Blair,NP- Evisit on 05/31/24.  Cardiologist : Laymon Qua- card appt on 06/17/2024   PPM/ ICD: Device Orders: Rep Notified:  Chest x-ray : 01/05/24- 1 view  CT Chest- 03/06/24  EKG : Echo : 2022  Stress test: 2021  Cardiac Cath :  CT cirs- 2023   Activity level:  Sleep Study/ CPAP : Fasting Blood Sugar :      / Checks Blood Sugar -- times a day:     DM- type  Hgba1c-   Metformin - noen am of surgery  Mounjaro- lat dose on   Blood Thinner/ Instructions /Last Dose: ASA / Instructions/ Last Dose :    05/16/24- CMP

## 2024-06-17 NOTE — Patient Instructions (Signed)
 SURGICAL WAITING ROOM VISITATION  Patients having surgery or a procedure may have no more than 2 support people in the waiting area - these visitors may rotate.    Children ages 73 and under will not be able to visit patients in [facility] under most circumstances.   Visitors with respiratory illnesses are discouraged from visiting and should remain at home.  If the patient needs to stay at the hospital during part of their recovery, the visitor guidelines for inpatient rooms apply. Pre-op nurse will coordinate an appropriate time for 1 support person to accompany patient in pre-op.  This support person may not rotate.    Please refer to the [facility] website for the visitor guidelines for Inpatients (after your surgery is over and you are in a regular room).       Your procedure is scheduled on: 06/24/24    Report to [facility] Main Entrance    Report to admitting at   0830AM   Call this number if you have problems the morning of surgery [phone]   Do not eat food :After Midnight.   After Midnight you may have the following liquids until _ 0730_____ AM DAY OF SURGERY  Water  Non-Citrus Juices (without pulp, NO RED-Apple, White grape, White cranberry) Black Coffee (NO MILK/CREAM OR CREAMERS, sugar ok)  Clear Tea (NO MILK/CREAM OR CREAMERS, sugar ok) regular and decaf                             Plain Jell-O (NO RED)                                           Fruit ices (not with fruit pulp, NO RED)                                     Popsicles (NO RED)                                                               Sports drinks like Gatorade (NO RED)                   The day of surgery:  Drink ONE (1) Pre-Surgery Clear Ensure or G2 at  0730AM the morning of surgery. Drink in one sitting. Do not sip.  This drink was given to you during your hospital  pre-op appointment visit. Nothing else to drink after completing the  Pre-Surgery Clear Ensure or  G2.          If you have questions, please contact your surgeons office.      Oral Hygiene is also important to reduce your risk of infection.                                    Remember - BRUSH YOUR TEETH THE MORNING OF SURGERY WITH YOUR REGULAR TOOTHPASTE  DENTURES WILL BE REMOVED PRIOR TO SURGERY PLEASE DO NOT APPLY Poly grip OR ADHESIVES!!!  Do NOT smoke after Midnight   Stop all vitamins and herbal supplements 7 days before surgery.   Take these medicines the morning of surgery with A SIP OF WATER :  Inhalers as usual and bring, wellbutrin , buspar , zyrtec  if needed, metoprolol , synthroid , antiovert if needed, zoloft    DO NOT TAKE ANY ORAL DIABETIC MEDICATIONS DAY OF YOUR SURGERY  Bring CPAP mask and tubing day of surgery.                              You may not have any metal on your body including hair pins, jewelry, and body piercing             Do not wear make-up, lotions, powders, perfumes/cologne, or deodorant  Do not wear nail polish including gel and S&S, artificial/acrylic nails, or any other type of covering on natural nails including finger and toenails. If you have artificial nails, gel coating, etc. that needs to be removed by a nail salon please have this removed prior to surgery or surgery may need to be canceled/ delayed if the surgeon/ anesthesia feels like they are unable to be safely monitored.   Do not shave  48 hours prior to surgery.               Men may shave face and neck.   Do not bring valuables to the hospital. [facility] IS NOT             RESPONSIBLE   FOR VALUABLES.   Contacts, glasses, dentures or bridgework may not be worn into surgery.   Bring small overnight bag day of surgery.   DO NOT BRING YOUR HOME MEDICATIONS TO THE HOSPITAL. PHARMACY WILL DISPENSE MEDICATIONS LISTED ON YOUR MEDICATION LIST TO YOU DURING YOUR ADMISSION IN THE HOSPITAL!    Patients discharged on the day of surgery will not be allowed to drive home.  Someone  NEEDS to stay with you for the first 24 hours after anesthesia.   Special Instructions: Bring a copy of your healthcare power of attorney and living will documents the day of surgery if you haven't scanned them before.              Please read over the following fact sheets you were given: IF YOU HAVE QUESTIONS ABOUT YOUR PRE-OP INSTRUCTIONS PLEASE CALL [phone].   If you received a COVID test during your pre-op visit  it is requested that you wear a mask when out in public, stay away from anyone that may not be feeling well and notify your surgeon if you develop symptoms. If you test positive for Covid or have been in contact with anyone that has tested positive in the last 10 days please notify you surgeon.    [place] - Preparing for Surgery Before surgery, you can play an important role.  Because skin is not sterile, your skin needs to be as free of germs as possible.  You can reduce the number of germs on your skin by washing with CHG (chlorahexidine gluconate) soap before surgery.  CHG is an antiseptic cleaner which kills germs and bonds with the skin to continue killing germs even after washing. Please DO NOT use if you have an allergy to CHG or antibacterial soaps.  If your skin becomes reddened/irritated stop using the CHG and inform your nurse when you arrive at Short Stay. Do not shave (including legs and underarms) for at least 48 hours  prior to the first CHG shower.  You may shave your face/neck.  Please follow these instructions carefully:  1.  Shower with CHG Soap the night before surgery ONLY (DO NOT USE THE SOAP THE MORNING OF SURGERY).  2.  If you choose to wash your hair, wash your hair first as usual with your normal  shampoo.  3.  After you shampoo, rinse your hair and body thoroughly to remove the shampoo.                             4.  Use CHG as you would any other liquid soap.  You can apply chg directly to the skin and wash.  Gently with a scrungie or clean  washcloth.  5.  Apply the CHG Soap to your body ONLY FROM THE NECK DOWN.   Do   not use on face/ open                           Wound or open sores. Avoid contact with eyes, ears mouth and   genitals (private parts).                       Wash face,  Genitals (private parts) with your normal soap.             6.  Wash thoroughly, paying special attention to the area where your    surgery  will be performed.  7.  Thoroughly rinse your body with warm water  from the neck down.  8.  DO NOT shower/wash with your normal soap after using and rinsing off the CHG Soap.                9.  Pat yourself dry with a clean towel.            10.  Wear clean pajamas.            11.  Place clean sheets on your bed the night of your first shower and do not  sleep with pets. Day of Surgery : Do not apply any CHG, lotions/deodorants the morning of surgery.  Please wear clean clothes to the hospital/surgery center.  FAILURE TO FOLLOW THESE INSTRUCTIONS MAY RESULT IN THE CANCELLATION OF YOUR SURGERY  PATIENT SIGNATURE_________________________________  NURSE SIGNATURE__________________________________  ________________________________________________________________________

## 2024-06-18 ENCOUNTER — Ambulatory Visit (HOSPITAL_COMMUNITY): Admitting: Occupational Therapy

## 2024-06-18 ENCOUNTER — Encounter: Payer: Self-pay | Admitting: Student

## 2024-06-19 ENCOUNTER — Encounter (HOSPITAL_COMMUNITY)
Admission: RE | Admit: 2024-06-19 | Discharge: 2024-06-19 | Disposition: A | Discharge status: Home / Self Care | Source: Ambulatory Visit | Attending: Family Medicine | Admitting: Family Medicine

## 2024-06-20 NOTE — Progress Notes (Signed)
 Christina Cameron                                          MRN: [id number]   06/20/2024   The [facility] Quality Team Specialist reviewed this patient medical record for the purposes of chart review for care gap closure. The following were reviewed: chart review for care gap closure-glycemic status assessment.    [facility] Quality Team

## 2024-06-24 ENCOUNTER — Ambulatory Visit (HOSPITAL_COMMUNITY): Admit: 2024-06-24 | Admitting: Surgery

## 2024-06-24 SURGERY — REPAIR, HERNIA, VENTRAL, ROBOT-ASSISTED
Anesthesia: General

## 2024-07-16 ENCOUNTER — Ambulatory Visit: Admitting: Urology

## 2024-07-17 ENCOUNTER — Ambulatory Visit: Admitting: Orthopedic Surgery

## 2024-09-01 ENCOUNTER — Inpatient Hospital Stay

## 2024-09-09 ENCOUNTER — Inpatient Hospital Stay: Admitting: Internal Medicine
